# Patient Record
Sex: Female | Born: 1945 | Race: White | Hispanic: No | Smoking: Former smoker
Health system: Southern US, Community
[De-identification: ages and names within clinical notes are randomized; demographics above are authoritative.]

## PROBLEM LIST (undated history)

## (undated) DIAGNOSIS — E785 Hyperlipidemia, unspecified: Secondary | ICD-10-CM

## (undated) DIAGNOSIS — M129 Arthropathy, unspecified: Secondary | ICD-10-CM

## (undated) DIAGNOSIS — M1990 Unspecified osteoarthritis, unspecified site: Secondary | ICD-10-CM

## (undated) DIAGNOSIS — K219 Gastro-esophageal reflux disease without esophagitis: Secondary | ICD-10-CM

## (undated) DIAGNOSIS — N951 Menopausal and female climacteric states: Secondary | ICD-10-CM

## (undated) DIAGNOSIS — R55 Syncope and collapse: Secondary | ICD-10-CM

## (undated) DIAGNOSIS — M810 Age-related osteoporosis without current pathological fracture: Secondary | ICD-10-CM

## (undated) DIAGNOSIS — R7309 Other abnormal glucose: Secondary | ICD-10-CM

## (undated) HISTORY — DX: Arthropathy, unspecified: M12.9

## (undated) HISTORY — DX: Other abnormal glucose: R73.09

## (undated) HISTORY — DX: Age-related osteoporosis without current pathological fracture: M81.0

## (undated) HISTORY — PX: FINGER FRACTURE SURGERY: SHX638

## (undated) HISTORY — DX: Menopausal and female climacteric states: N95.1

## (undated) HISTORY — DX: Syncope and collapse: R55

## (undated) HISTORY — DX: Gastro-esophageal reflux disease without esophagitis: K21.9

## (undated) HISTORY — PX: BREAST BIOPSY: SHX20

## (undated) HISTORY — DX: Hyperlipidemia, unspecified: E78.5

## (undated) HISTORY — PX: ABDOMINAL HYSTERECTOMY: SHX81

## (undated) HISTORY — PX: COLONOSCOPY: SHX174

## (undated) HISTORY — PX: APPENDECTOMY: SHX54

---

## 2004-09-13 ENCOUNTER — Ambulatory Visit: Payer: Self-pay | Admitting: Internal Medicine

## 2005-11-16 ENCOUNTER — Emergency Department: Payer: Self-pay | Admitting: Emergency Medicine

## 2011-05-02 ENCOUNTER — Ambulatory Visit: Payer: Self-pay

## 2011-11-20 ENCOUNTER — Emergency Department: Payer: Self-pay | Admitting: Internal Medicine

## 2011-11-20 LAB — CK TOTAL AND CKMB (NOT AT ARMC)
CK, Total: 55 U/L (ref 21–215)
CK-MB: <0.5 ng/mL — ABNORMAL LOW (ref 0.5–3.6)

## 2011-11-20 LAB — CBC
HCT: 40.3 % (ref 35.0–47.0)
HGB: 13.6 g/dL (ref 12.0–16.0)
MCH: 30.3 pg (ref 26.0–34.0)
MCHC: 33.7 g/dL (ref 32.0–36.0)
MCV: 90 fL (ref 80–100)
Platelet: 191 10*3/uL (ref 150–440)
RBC: 4.49 10*6/uL (ref 3.80–5.20)
RDW: 14.4 % (ref 11.5–14.5)
WBC: 9.7 10*3/uL (ref 3.6–11.0)

## 2011-11-20 LAB — URINALYSIS, COMPLETE
Bilirubin,UR: NEGATIVE
Blood: NEGATIVE
Glucose,UR: NEGATIVE mg/dL (ref 0–75)
Hyaline Cast: 10
Ketone: NEGATIVE
Nitrite: NEGATIVE
Ph: 5 (ref 4.5–8.0)
Protein: NEGATIVE
RBC,UR: 1 /HPF (ref 0–5)
Specific Gravity: 1.012 (ref 1.003–1.030)
Squamous Epithelial: NONE SEEN
WBC UR: 3 /HPF (ref 0–5)

## 2011-11-20 LAB — COMPREHENSIVE METABOLIC PANEL
Albumin: 3.9 g/dL (ref 3.4–5.0)
Alkaline Phosphatase: 72 U/L (ref 50–136)
Anion Gap: 11 (ref 7–16)
BUN: 21 mg/dL — ABNORMAL HIGH (ref 7–18)
Bilirubin,Total: 0.7 mg/dL (ref 0.2–1.0)
Calcium, Total: 9.4 mg/dL (ref 8.5–10.1)
Chloride: 100 mmol/L (ref 98–107)
Co2: 30 mmol/L (ref 21–32)
Creatinine: 1 mg/dL (ref 0.60–1.30)
EGFR (African American): >60
EGFR (Non-African Amer.): 59 — ABNORMAL LOW
Glucose: 158 mg/dL — ABNORMAL HIGH (ref 65–99)
Osmolality: 288 (ref 275–301)
Potassium: 4.1 mmol/L (ref 3.5–5.1)
SGOT(AST): 54 U/L — ABNORMAL HIGH (ref 15–37)
SGPT (ALT): 62 U/L
Sodium: 141 mmol/L (ref 136–145)
Total Protein: 7.2 g/dL (ref 6.4–8.2)

## 2011-11-20 LAB — TROPONIN I: Troponin-I: <0.02 ng/mL

## 2011-11-20 LAB — HEMOGLOBIN A1C: Hemoglobin A1C: 6.5 % — ABNORMAL HIGH (ref 4.2–6.3)

## 2013-02-09 ENCOUNTER — Ambulatory Visit: Payer: Self-pay | Admitting: Family Medicine

## 2013-02-15 ENCOUNTER — Ambulatory Visit: Payer: Self-pay

## 2013-06-22 ENCOUNTER — Encounter: Payer: Self-pay | Admitting: *Deleted

## 2013-06-22 ENCOUNTER — Encounter: Payer: Self-pay | Admitting: Cardiovascular Disease

## 2013-06-22 ENCOUNTER — Ambulatory Visit (INDEPENDENT_AMBULATORY_CARE_PROVIDER_SITE_OTHER): Payer: Medicare Other | Admitting: Cardiovascular Disease

## 2013-06-22 VITALS — BP 128/83 | HR 87 | Ht 65.5 in | Wt 244.5 lb

## 2013-06-22 DIAGNOSIS — I10 Essential (primary) hypertension: Secondary | ICD-10-CM

## 2013-06-22 DIAGNOSIS — R002 Palpitations: Secondary | ICD-10-CM

## 2013-06-22 DIAGNOSIS — R0602 Shortness of breath: Secondary | ICD-10-CM

## 2013-06-22 DIAGNOSIS — I209 Angina pectoris, unspecified: Secondary | ICD-10-CM | POA: Insufficient documentation

## 2013-06-22 NOTE — Patient Instructions (Addendum)
Your physician has requested that you have en exercise stress myoview. For further information please visit [email or web address] Please follow instruction sheet, as given.  [facility] MYOVIEW  Your caregiver has ordered a Stress Test with nuclear imaging. The purpose of this test is to evaluate the blood supply to your heart muscle. This procedure is referred to as a "Non-Invasive Stress Test." This is because other than having an IV started in your vein, nothing is inserted or "invades" your body. Cardiac stress tests are done to find areas of poor blood flow to the heart by determining the extent of coronary artery disease (CAD). Some patients exercise on a treadmill, which naturally increases the blood flow to your heart, while others who are  unable to walk on a treadmill due to physical limitations have a pharmacologic/chemical stress agent called Lexiscan . This medicine will mimic walking on a treadmill by temporarily increasing your coronary blood flow.   Please note: these test may take anywhere between 2-4 hours to complete  PLEASE REPORT TO [facility]TR[facility]  THE VOLUNTEERS AT THE FIRST DESK WILL DIRECT YOU WHERE TO GO  Date of Procedure:__________THURSDAY, SEPT 18____________  Arrival Time for Procedure:_________7:30 am_____________________    PLEASE NOTIFY THE OFFICE AT LEAST 24 HOURS IN ADVANCE IF YOU ARE UNABLE TO KEEP YOUR APPOINTMENT.  [phone] AND  PLEASE NOTIFY NUCLEAR MEDICINE AT [facility] AT LEAST 24 HOURS IN ADVANCE IF YOU ARE UNABLE TO KEEP YOUR APPOINTMENT. [phone]  How to prepare for your Myoview test:  1. Do not eat or drink after midnight 2. No caffeine for 24 hours prior to test 3. No smoking 24 hours prior to test. 4. Your medication may be taken with water.  If your doctor stopped a medication because of this test, do not take that medication. 5. Ladies, please do not wear dresses.  Skirts or pants are appropriate. Please wear a short sleeve  shirt. 6. No perfume, cologne or lotion. 7. Wear comfortable walking shoes. No heels!

## 2013-06-22 NOTE — Assessment & Plan Note (Signed)
Blood pressure is well controlled on current medications. I will consider adding a beta blocker after the stress test.

## 2013-06-22 NOTE — Assessment & Plan Note (Signed)
Current symptoms are suggestive of class III angina. Symptoms include shortness of breath and neck pain without chest discomfort. Baseline ECG is  abnormal. I recommend urgent risk stratification with a treadmill nuclear stress test. Due to her abnormal baseline EKG, a treadmill stress test alone is not sufficient. Continue current medications. Further recommendations to follow after the stress test with possibility of proceeding with cardiac catheterization if the stress test is moderate or high risk.

## 2013-06-22 NOTE — Progress Notes (Signed)
HPI  This is a pleasant 67 year old female who was referred by Dr. Juanetta Gosling for evaluation of exertional dyspnea and neck pain. She has no previous cardiac history. She has known history of hypertension, hyperlipidemia, diet-controlled diabetes and GERD. Over the last 2 months, she started having significant exertional dyspnea with fatigue and left-sided neck discomfort with occasional radiation to the left on. This happens after walking about one block or going up one flight of stairs. She has not had any chest discomfort. She does feel palpitations as well. No orthopnea, PND or lower extremity edema. No previous similar symptoms. She had an EKG done yesterday which showed inferior Q waves mainly in lead 3 with inverted T waves in V1 and V2. No ST elevation or depression.    Allergies  Allergen Reactions  . Erythromycin      No current outpatient prescriptions on file prior to visit.   No current facility-administered medications on file prior to visit.     Past Medical History  Diagnosis Date  . Arthropathy, unspecified, site unspecified   . Esophageal reflux   . Unspecified essential hypertension   . Osteoporosis, unspecified   . Other and unspecified hyperlipidemia   . Symptomatic menopausal or female climacteric states   . Other abnormal glucose   . Syncope and collapse      Past Surgical History  Procedure Laterality Date  . Appendectomy    . Finger fracture surgery    . Colonoscopy    . Breast biopsy Left      Family History  Problem Relation Age of Onset  . Diabetes Mother   . Heart disease Mother   . Heart attack Mother      History   Social History  . Marital Status: Married    Spouse Name: N/A    Number of Children: N/A  . Years of Education: N/A   Occupational History  . Not on file.   Social History Main Topics  . Smoking status: Former Smoker -- 1.00 packs/day for 26 years  . Smokeless tobacco: Not on file  . Alcohol Use: Yes     Comment:  occassional  . Drug Use: No  . Sexual Activity: Not on file   Other Topics Concern  . Not on file   Social History Narrative  . No narrative on file     ROS A 10 POINT REVIEW OF SYSTEM WAS PERFORMED. IT'S NEGATIVE OTHER THAN WHAT'S MENTIONED IN HISTORY OF PRESENT ILLNESS.   PHYSICAL EXAM   BP 128/83  Pulse 87  Ht 5' 5.5" (1.664 m)  Wt 244 lb 8 oz (110.904 kg)  BMI 40.05 kg/m2 Constitutional: She is oriented to person, place, and time. She appears well-developed and well-nourished. No distress.  HENT: No nasal discharge.  Head: Normocephalic and atraumatic.  Eyes: Pupils are equal and round. Right eye exhibits no discharge. Left eye exhibits no discharge.  Neck: Normal range of motion. Neck supple. No JVD present. No thyromegaly present.  Cardiovascular: Normal rate, regular rhythm, normal heart sounds. Exam reveals no gallop and no friction rub. No murmur heard.  Pulmonary/Chest: Effort normal and breath sounds normal. No stridor. No respiratory distress. She has no wheezes. She has no rales. She exhibits no tenderness.  Abdominal: Soft. Bowel sounds are normal. She exhibits no distension. There is no tenderness. There is no rebound and no guarding.  Musculoskeletal: Normal range of motion. She exhibits no edema and no tenderness.  Neurological: She is alert and oriented to  person, place, and time. Coordination normal.  Skin: Skin is warm and dry. No rash noted. She is not diaphoretic. No erythema. No pallor.  Psychiatric: She has a normal mood and affect. Her behavior is normal. Judgment and thought content normal.     ZOX:WRUEAV sinus rhythm with Q wave in lead 3. Nonspecific anterior T wave changes.   ASSESSMENT AND PLAN

## 2013-06-24 ENCOUNTER — Ambulatory Visit: Payer: Self-pay | Admitting: Cardiovascular Disease

## 2013-06-24 ENCOUNTER — Encounter: Payer: Self-pay | Admitting: Cardiovascular Disease

## 2013-06-24 ENCOUNTER — Other Ambulatory Visit: Payer: Self-pay

## 2013-06-24 DIAGNOSIS — R0602 Shortness of breath: Secondary | ICD-10-CM

## 2013-06-25 ENCOUNTER — Telehealth: Payer: Self-pay

## 2013-06-25 LAB — HM HEPATITIS C SCREENING LAB: HM Hepatitis Screen: NEGATIVE

## 2013-06-25 NOTE — Telephone Encounter (Signed)
Pt would like stress test results.  

## 2013-06-28 ENCOUNTER — Telehealth: Payer: Self-pay

## 2013-06-28 ENCOUNTER — Ambulatory Visit: Payer: Self-pay | Admitting: Cardiovascular Disease

## 2013-06-28 DIAGNOSIS — R0600 Dyspnea, unspecified: Secondary | ICD-10-CM

## 2013-06-28 NOTE — Telephone Encounter (Signed)
Pt given myoview results and Dr Jari Sportsman recommendation to have echo and follow up appt with him after echo. Pt agreed with this plan.

## 2013-06-28 NOTE — Telephone Encounter (Signed)
Pt called wants results from stress test.

## 2013-06-29 NOTE — Telephone Encounter (Signed)
Second phone open for same thing. Per 9/22 note, the patient was made aware of her results and need for echo. Order placed by Katina Dung.

## 2013-06-30 ENCOUNTER — Encounter: Payer: Self-pay | Admitting: Cardiovascular Disease

## 2013-07-16 ENCOUNTER — Ambulatory Visit (INDEPENDENT_AMBULATORY_CARE_PROVIDER_SITE_OTHER): Payer: Medicare Other | Admitting: *Deleted

## 2013-07-16 DIAGNOSIS — R0602 Shortness of breath: Secondary | ICD-10-CM

## 2013-07-16 DIAGNOSIS — R0600 Dyspnea, unspecified: Secondary | ICD-10-CM

## 2013-07-16 DIAGNOSIS — R9431 Abnormal electrocardiogram [ECG] [EKG]: Secondary | ICD-10-CM

## 2013-07-16 DIAGNOSIS — I209 Angina pectoris, unspecified: Secondary | ICD-10-CM

## 2013-07-20 ENCOUNTER — Telehealth: Payer: Self-pay

## 2013-07-20 NOTE — Telephone Encounter (Signed)
Spoke w/ pt.  She is aware of results.  

## 2013-07-20 NOTE — Telephone Encounter (Signed)
Message copied by Marilynne Halsted on Tue Jul 20, 2013 10:44 AM ------      Message from: Lorine Bears A      Created: Tue Jul 20, 2013  7:58 AM       Inform patient that echo was normal.      CC: Dr. Juanetta Gosling ------

## 2013-07-23 ENCOUNTER — Ambulatory Visit (INDEPENDENT_AMBULATORY_CARE_PROVIDER_SITE_OTHER): Payer: Medicare Other | Admitting: Cardiovascular Disease

## 2013-07-23 ENCOUNTER — Encounter: Payer: Self-pay | Admitting: Cardiovascular Disease

## 2013-07-23 VITALS — BP 114/78 | HR 66 | Ht 65.5 in | Wt 237.2 lb

## 2013-07-23 DIAGNOSIS — R0600 Dyspnea, unspecified: Secondary | ICD-10-CM | POA: Insufficient documentation

## 2013-07-23 DIAGNOSIS — I10 Essential (primary) hypertension: Secondary | ICD-10-CM

## 2013-07-23 DIAGNOSIS — R0609 Other forms of dyspnea: Secondary | ICD-10-CM

## 2013-07-23 NOTE — Patient Instructions (Signed)
Your heart tests were normal.   Follow up as needed.  

## 2013-07-23 NOTE — Progress Notes (Signed)
HPI  This is a pleasant 67 year old female who is here for a follow up visit regarding exertional dyspnea and abnormal EKG.  She has known history of hypertension, hyperlipidemia, diet-controlled diabetes and GERD. Over the last few months, she started having significant exertional dyspnea with fatigue and left-sided neck discomfort with occasional radiation to the left arm. This happens after walking about one block or going up one flight of stairs. She has not had any chest discomfort. She does feel palpitations as well.  EKG showed inferior Q waves mainly in lead 3 with inverted T waves in V1 and V2. No ST elevation or depression.   She underwent a stress Myoview which showed no evidence of ischemia. Echo showed normal LVSF with no significant valvular abnormalities. She is actually feeling better with no recurrent symptoms.   Allergies  Allergen Reactions  . Erythromycin      Current Outpatient Prescriptions on File Prior to Visit  Medication Sig Dispense Refill  . amLODipine (NORVASC) 5 MG tablet Take 5 mg by mouth daily.       Marland Kitchen aspirin 81 MG tablet Take 81 mg by mouth daily.      . beta carotene w/minerals (OCUVITE) tablet Take 1 tablet by mouth daily.      . Calcium Carb-Cholecalciferol (CALTRATE 600+D) 600-800 MG-UNIT TABS Take 2 tablets by mouth daily.      . Cholecalciferol (VITAMIN D3) 50000 UNITS CAPS Take by mouth daily.      . Cyanocobalamin (VITAMIN B 12 PO) Take 500 mg by mouth daily.      Marland Kitchen EVISTA 60 MG tablet Take 60 mg by mouth daily.       Boris Lown Oil 300 MG CAPS Take by mouth daily.      Marland Kitchen lisinopril (PRINIVIL,ZESTRIL) 40 MG tablet Take 40 mg by mouth daily.      . Multiple Vitamin (MULTIVITAMIN) tablet Take 1 tablet by mouth daily.      Marland Kitchen NEXIUM 40 MG capsule Take 40 mg by mouth 2 (two) times daily.       . simvastatin (ZOCOR) 10 MG tablet Take 10 mg by mouth at bedtime.       . Misc Natural Products (OSTEO BI-FLEX JOINT SHIELD PO) Take by mouth as needed.         No current facility-administered medications on file prior to visit.     Past Medical History  Diagnosis Date  . Arthropathy, unspecified, site unspecified   . Esophageal reflux   . Osteoporosis, unspecified   . Other and unspecified hyperlipidemia   . Symptomatic menopausal or female climacteric states   . Other abnormal glucose   . Syncope and collapse   . Unspecified essential hypertension      Past Surgical History  Procedure Laterality Date  . Appendectomy    . Finger fracture surgery    . Colonoscopy    . Breast biopsy Left      Family History  Problem Relation Age of Onset  . Diabetes Mother   . Heart disease Mother   . Heart attack Mother      History   Social History  . Marital Status: Married    Spouse Name: N/A    Number of Children: N/A  . Years of Education: N/A   Occupational History  . Not on file.   Social History Main Topics  . Smoking status: Former Smoker -- 1.00 packs/day for 26 years  . Smokeless tobacco: Not on file  .  Alcohol Use: No     Comment: occassional  . Drug Use: No  . Sexual Activity: Not on file   Other Topics Concern  . Not on file   Social History Narrative  . No narrative on file      PHYSICAL EXAM   BP 114/78  Pulse 66  Ht 5' 5.5" (1.664 m)  Wt 237 lb 4 oz (107.616 kg)  BMI 38.87 kg/m2 Constitutional: She is oriented to person, place, and time. She appears well-developed and well-nourished. No distress.  HENT: No nasal discharge.  Head: Normocephalic and atraumatic.  Eyes: Pupils are equal and round. Right eye exhibits no discharge. Left eye exhibits no discharge.  Neck: Normal range of motion. Neck supple. No JVD present. No thyromegaly present.  Cardiovascular: Normal rate, regular rhythm, normal heart sounds. Exam reveals no gallop and no friction rub. No murmur heard.  Pulmonary/Chest: Effort normal and breath sounds normal. No stridor. No respiratory distress. She has no wheezes. She has no  rales. She exhibits no tenderness.  Abdominal: Soft. Bowel sounds are normal. She exhibits no distension. There is no tenderness. There is no rebound and no guarding.  Musculoskeletal: Normal range of motion. She exhibits no edema and no tenderness.  Neurological: She is alert and oriented to person, place, and time. Coordination normal.  Skin: Skin is warm and dry. No rash noted. She is not diaphoretic. No erythema. No pallor.  Psychiatric: She has a normal mood and affect. Her behavior is normal. Judgment and thought content normal.      ASSESSMENT AND PLAN

## 2013-07-23 NOTE — Assessment & Plan Note (Addendum)
Negative cardiac work up including stress test and echo.  Symptoms resolved without intervention.  Follow up as needed. Continue treatment of risk factors.  I discussed with the patient the importance of lifestyle changes in order to decrease the chance of future coronary artery disease and cardiovascular events. We discussed the importance of controlling risk factors, healthy diet as well as regular exercise. I also explained to him that a normal stress test does not rule out atherosclerosis.

## 2013-07-23 NOTE — Assessment & Plan Note (Signed)
BP is controlled 

## 2013-08-12 ENCOUNTER — Other Ambulatory Visit: Payer: Self-pay

## 2013-08-29 ENCOUNTER — Encounter: Payer: Self-pay | Admitting: Cardiovascular Disease

## 2013-09-01 ENCOUNTER — Ambulatory Visit: Payer: Self-pay | Admitting: Family Medicine

## 2013-12-08 ENCOUNTER — Ambulatory Visit: Payer: Self-pay | Admitting: Family Medicine

## 2013-12-16 ENCOUNTER — Ambulatory Visit: Payer: Self-pay | Admitting: Family Medicine

## 2014-06-14 LAB — HM DIABETES EYE EXAM

## 2015-06-05 ENCOUNTER — Encounter: Payer: Self-pay | Admitting: Family Medicine

## 2015-06-05 ENCOUNTER — Ambulatory Visit (INDEPENDENT_AMBULATORY_CARE_PROVIDER_SITE_OTHER): Payer: Medicare Other | Admitting: Family Medicine

## 2015-06-05 VITALS — BP 136/78 | HR 65 | Temp 97.9°F | Resp 16 | Ht 65.0 in | Wt 217.0 lb

## 2015-06-05 DIAGNOSIS — R1010 Upper abdominal pain, unspecified: Secondary | ICD-10-CM | POA: Diagnosis not present

## 2015-06-05 DIAGNOSIS — R0789 Other chest pain: Secondary | ICD-10-CM | POA: Diagnosis not present

## 2015-06-05 NOTE — Patient Instructions (Addendum)
Use Nexium 40 mg. Twice a day, every day.  Go to [facility] if sever persistent pain recurs.

## 2015-06-05 NOTE — Progress Notes (Signed)
Name: Yvette [name]   MRN: [id number]    DOB: 1946/06/22   Date:06/05/2015       Progress Note  Subjective  Chief Complaint  Chief Complaint  Patient presents with  . Gastrophageal Reflux    pt had pain last night under sternum. She took Tums and this last several hours.    HPI  C/o pain epigastrium last PM.  Resolved after taking some TUMS.  She almost called 911 because of the pain.  Then another episode early this AM, but it subsided.  She feels bloated in upper abdomen. Had BM after episode.  Felt very week.  Sweated.    Had card. W/u in 2014.  Cleared without evidence of sig. Heart disease.  Has extra stress at home with sick husband.  No problem-specific assessment & plan notes found for this encounter.   Past Medical History  Diagnosis Date  . Arthropathy, unspecified, site unspecified   . Esophageal reflux   . Osteoporosis, unspecified   . Other and unspecified hyperlipidemia   . Symptomatic menopausal or female climacteric states   . Other abnormal glucose   . Syncope and collapse   . Unspecified essential hypertension     Social History  Substance Use Topics  . Smoking status: Former Smoker -- 1.00 packs/day for 26 years    Types: Cigarettes    Quit date: 06/07/1992  . Smokeless tobacco: Never Used  . Alcohol Use: No     Comment: occassional     Current outpatient prescriptions:  .  amLODipine (NORVASC) 5 MG tablet, Take 5 mg by mouth daily. , Disp: , Rfl:  .  aspirin 81 MG tablet, Take 81 mg by mouth daily., Disp: , Rfl:  .  beta carotene w/minerals (OCUVITE) tablet, Take 1 tablet by mouth daily., Disp: , Rfl:  .  Calcium Carb-Cholecalciferol (CALTRATE 600+D) 600-800 MG-UNIT TABS, Take 2 tablets by mouth daily., Disp: , Rfl:  .  Cholecalciferol (VITAMIN D3) 50000 UNITS CAPS, Take by mouth daily., Disp: , Rfl:  .  Cyanocobalamin (VITAMIN B 12 PO), Take 500 mg by mouth daily., Disp: , Rfl:  .  EVISTA 60 MG tablet, Take 60 mg by mouth daily. , Disp: , Rfl:   .  hydrochlorothiazide (HYDRODIURIL) 25 MG tablet, Take 25 mg by mouth daily., Disp: , Rfl:  .  Krill Oil 300 MG CAPS, Take by mouth daily., Disp: , Rfl:  .  lisinopril (PRINIVIL,ZESTRIL) 40 MG tablet, Take 40 mg by mouth daily., Disp: , Rfl:  .  Misc Natural Products (OSTEO BI-FLEX JOINT SHIELD PO), Take by mouth as needed. , Disp: , Rfl:  .  Multiple Vitamin (MULTIVITAMIN) tablet, Take 1 tablet by mouth daily., Disp: , Rfl:  .  naproxen (NAPROSYN) 250 MG tablet, Take 250 mg by mouth as needed., Disp: , Rfl:  .  NEXIUM 40 MG capsule, Take 40 mg by mouth 2 (two) times daily. , Disp: , Rfl:  .  simvastatin (ZOCOR) 10 MG tablet, Take 10 mg by mouth at bedtime. , Disp: , Rfl:   Allergies  Allergen Reactions  . Erythromycin     Review of Systems  Constitutional: Positive for malaise/fatigue. Negative for fever, chills and weight loss.  HENT: Negative for hearing loss.   Eyes: Negative for blurred vision and double vision.  Respiratory: Negative for cough, sputum production, shortness of breath and wheezing.   Cardiovascular: Positive for chest pain. Negative for palpitations, orthopnea and leg swelling.  Gastrointestinal: Positive for heartburn, nausea  and abdominal pain. Negative for vomiting, diarrhea and blood in stool.  Genitourinary: Negative for dysuria, urgency and frequency.  Musculoskeletal: Negative for myalgias and joint pain.  Skin: Negative for rash.  Neurological: Negative for dizziness, sensory change, focal weakness, weakness and headaches.  Psychiatric/Behavioral: Negative for depression. The patient is not nervous/anxious.       Objective  Filed Vitals:   06/05/15 1100  BP: 136/78  Pulse: 65  Temp: 97.9 F (36.6 C)  TempSrc: Oral  Resp: 16  Height: 5\' 5"  (1.651 m)  Weight: 217 lb (98.431 kg)     Physical Exam  Constitutional: She is oriented to person, place, and time and well-developed, well-nourished, and in no distress. No distress.  HENT:  Head:  Normocephalic and atraumatic.  Eyes: Conjunctivae and EOM are normal. Pupils are equal, round, and reactive to light. No scleral icterus.  Neck: Neck supple. Carotid bruit is not present. No thyromegaly present.  Cardiovascular: Normal rate, regular rhythm, normal heart sounds and intact distal pulses.  Exam reveals no gallop and no friction rub.   No murmur heard. EKG wnl.  Pulmonary/Chest: Effort normal and breath sounds normal. No respiratory distress. She has no wheezes. She has no rales. She exhibits no tenderness.  Abdominal: Soft. Bowel sounds are normal. She exhibits no mass. Distention: mild vague tenderness in epigastrium and RUQ.   There is tenderness. There is no rebound and no guarding.  Musculoskeletal: She exhibits no edema.  Lymphadenopathy:    She has no cervical adenopathy.  Neurological: She is alert and oriented to person, place, and time.  Vitals reviewed.     No results found for this or any previous visit (from the past 2160 hour(s)).   Assessment & Plan  1. Pain of upper abdomen  - EKG 12-Lead-wnl - US Abdomen Limited; Future - Ambulatory referral to Gastroenterology  2. Chest discomfort  - EKG 12-Lead-wnl

## 2015-06-06 ENCOUNTER — Emergency Department: Payer: Medicare Other

## 2015-06-06 ENCOUNTER — Encounter: Payer: Self-pay | Admitting: Emergency Medicine

## 2015-06-06 ENCOUNTER — Inpatient Hospital Stay
Admission: EM | Admit: 2015-06-06 | Discharge: 2015-06-08 | DRG: 444 | Disposition: A | Discharge status: Home / Self Care | Payer: Medicare Other | Attending: Internal Medicine | Admitting: Internal Medicine

## 2015-06-06 ENCOUNTER — Inpatient Hospital Stay: Payer: Medicare Other

## 2015-06-06 DIAGNOSIS — K8309 Other cholangitis: Secondary | ICD-10-CM

## 2015-06-06 DIAGNOSIS — D638 Anemia in other chronic diseases classified elsewhere: Secondary | ICD-10-CM | POA: Diagnosis present

## 2015-06-06 DIAGNOSIS — R112 Nausea with vomiting, unspecified: Secondary | ICD-10-CM | POA: Diagnosis not present

## 2015-06-06 DIAGNOSIS — E872 Acidosis: Secondary | ICD-10-CM | POA: Diagnosis not present

## 2015-06-06 DIAGNOSIS — R748 Abnormal levels of other serum enzymes: Secondary | ICD-10-CM | POA: Diagnosis not present

## 2015-06-06 DIAGNOSIS — I10 Essential (primary) hypertension: Secondary | ICD-10-CM | POA: Diagnosis not present

## 2015-06-06 DIAGNOSIS — Z8249 Family history of ischemic heart disease and other diseases of the circulatory system: Secondary | ICD-10-CM

## 2015-06-06 DIAGNOSIS — Z7982 Long term (current) use of aspirin: Secondary | ICD-10-CM | POA: Diagnosis not present

## 2015-06-06 DIAGNOSIS — B9620 Unspecified Escherichia coli [E. coli] as the cause of diseases classified elsewhere: Secondary | ICD-10-CM | POA: Diagnosis not present

## 2015-06-06 DIAGNOSIS — Z79899 Other long term (current) drug therapy: Secondary | ICD-10-CM

## 2015-06-06 DIAGNOSIS — Z833 Family history of diabetes mellitus: Secondary | ICD-10-CM

## 2015-06-06 DIAGNOSIS — K859 Acute pancreatitis without necrosis or infection, unspecified: Secondary | ICD-10-CM | POA: Diagnosis present

## 2015-06-06 DIAGNOSIS — K5730 Diverticulosis of large intestine without perforation or abscess without bleeding: Secondary | ICD-10-CM | POA: Diagnosis not present

## 2015-06-06 DIAGNOSIS — D6959 Other secondary thrombocytopenia: Secondary | ICD-10-CM | POA: Diagnosis not present

## 2015-06-06 DIAGNOSIS — M129 Arthropathy, unspecified: Secondary | ICD-10-CM | POA: Diagnosis present

## 2015-06-06 DIAGNOSIS — N281 Cyst of kidney, acquired: Secondary | ICD-10-CM | POA: Diagnosis not present

## 2015-06-06 DIAGNOSIS — R109 Unspecified abdominal pain: Secondary | ICD-10-CM | POA: Diagnosis not present

## 2015-06-06 DIAGNOSIS — K219 Gastro-esophageal reflux disease without esophagitis: Secondary | ICD-10-CM | POA: Diagnosis present

## 2015-06-06 DIAGNOSIS — R740 Nonspecific elevation of levels of transaminase and lactic acid dehydrogenase [LDH]: Secondary | ICD-10-CM | POA: Diagnosis present

## 2015-06-06 DIAGNOSIS — E785 Hyperlipidemia, unspecified: Secondary | ICD-10-CM | POA: Diagnosis not present

## 2015-06-06 DIAGNOSIS — M810 Age-related osteoporosis without current pathological fracture: Secondary | ICD-10-CM | POA: Diagnosis present

## 2015-06-06 DIAGNOSIS — Z87891 Personal history of nicotine dependence: Secondary | ICD-10-CM | POA: Diagnosis not present

## 2015-06-06 DIAGNOSIS — R1010 Upper abdominal pain, unspecified: Secondary | ICD-10-CM

## 2015-06-06 DIAGNOSIS — M1990 Unspecified osteoarthritis, unspecified site: Secondary | ICD-10-CM | POA: Diagnosis not present

## 2015-06-06 DIAGNOSIS — R110 Nausea: Secondary | ICD-10-CM | POA: Diagnosis not present

## 2015-06-06 DIAGNOSIS — A419 Sepsis, unspecified organism: Secondary | ICD-10-CM | POA: Diagnosis present

## 2015-06-06 DIAGNOSIS — K8061 Calculus of gallbladder and bile duct with cholecystitis, unspecified, with obstruction: Secondary | ICD-10-CM | POA: Diagnosis not present

## 2015-06-06 DIAGNOSIS — R509 Fever, unspecified: Secondary | ICD-10-CM | POA: Diagnosis not present

## 2015-06-06 DIAGNOSIS — R1011 Right upper quadrant pain: Secondary | ICD-10-CM | POA: Diagnosis not present

## 2015-06-06 DIAGNOSIS — E876 Hypokalemia: Secondary | ICD-10-CM | POA: Diagnosis not present

## 2015-06-06 DIAGNOSIS — R7881 Bacteremia: Secondary | ICD-10-CM | POA: Diagnosis not present

## 2015-06-06 DIAGNOSIS — K830 Cholangitis: Secondary | ICD-10-CM | POA: Diagnosis not present

## 2015-06-06 DIAGNOSIS — R1084 Generalized abdominal pain: Secondary | ICD-10-CM | POA: Diagnosis not present

## 2015-06-06 LAB — LIPID PANEL
Cholesterol: 192 mg/dL (ref 0–200)
HDL: 56 mg/dL (ref >40)
LDL Cholesterol: 105 mg/dL — ABNORMAL HIGH (ref 0–99)
Total CHOL/HDL Ratio: 3.4 RATIO
Triglycerides: 153 mg/dL — ABNORMAL HIGH (ref <150)
VLDL: 31 mg/dL (ref 0–40)

## 2015-06-06 LAB — CBC WITH DIFFERENTIAL/PLATELET
Basophils Absolute: 0 10*3/uL (ref 0–0.1)
Basophils Relative: 0 %
Eosinophils Absolute: 0 10*3/uL (ref 0–0.7)
Eosinophils Relative: 1 %
HCT: 39.4 % (ref 35.0–47.0)
Hemoglobin: 13.1 g/dL (ref 12.0–16.0)
Lymphocytes Relative: 4 %
Lymphs Abs: 0.2 10*3/uL — ABNORMAL LOW (ref 1.0–3.6)
MCH: 29.8 pg (ref 26.0–34.0)
MCHC: 33.3 g/dL (ref 32.0–36.0)
MCV: 89.7 fL (ref 80.0–100.0)
Monocytes Absolute: 0.1 10*3/uL — ABNORMAL LOW (ref 0.2–0.9)
Monocytes Relative: 2 %
Neutro Abs: 4.2 10*3/uL (ref 1.4–6.5)
Neutrophils Relative %: 93 %
Platelets: 110 10*3/uL — ABNORMAL LOW (ref 150–440)
RBC: 4.39 MIL/uL (ref 3.80–5.20)
RDW: 14.2 % (ref 11.5–14.5)
WBC: 4.5 10*3/uL (ref 3.6–11.0)

## 2015-06-06 LAB — LIPASE, BLOOD: Lipase: 140 U/L — ABNORMAL HIGH (ref 22–51)

## 2015-06-06 LAB — URINALYSIS COMPLETE WITH MICROSCOPIC (ARMC ONLY)
Bacteria, UA: NONE SEEN
Bilirubin Urine: NEGATIVE
Glucose, UA: NEGATIVE mg/dL
Hgb urine dipstick: NEGATIVE
Ketones, ur: NEGATIVE mg/dL
Leukocytes, UA: NEGATIVE
Nitrite: NEGATIVE
Protein, ur: NEGATIVE mg/dL
Specific Gravity, Urine: 1.017 (ref 1.005–1.030)
pH: 7 (ref 5.0–8.0)

## 2015-06-06 LAB — COMPREHENSIVE METABOLIC PANEL
ALT: 116 U/L — ABNORMAL HIGH (ref 14–54)
AST: 210 U/L — ABNORMAL HIGH (ref 15–41)
Albumin: 3.9 g/dL (ref 3.5–5.0)
Alkaline Phosphatase: 90 U/L (ref 38–126)
Anion gap: 9 (ref 5–15)
BUN: 16 mg/dL (ref 6–20)
CO2: 25 mmol/L (ref 22–32)
Calcium: 8.8 mg/dL — ABNORMAL LOW (ref 8.9–10.3)
Chloride: 106 mmol/L (ref 101–111)
Creatinine, Ser: 0.61 mg/dL (ref 0.44–1.00)
GFR calc Af Amer: >60 mL/min (ref >60)
GFR calc non Af Amer: >60 mL/min (ref >60)
Glucose, Bld: 195 mg/dL — ABNORMAL HIGH (ref 65–99)
Potassium: 3.9 mmol/L (ref 3.5–5.1)
Sodium: 140 mmol/L (ref 135–145)
Total Bilirubin: 2.7 mg/dL — ABNORMAL HIGH (ref 0.3–1.2)
Total Protein: 6.4 g/dL — ABNORMAL LOW (ref 6.5–8.1)

## 2015-06-06 LAB — LACTIC ACID, PLASMA
Lactic Acid, Venous: 3.3 mmol/L (ref 0.5–2.0)
Lactic Acid, Venous: 2.6 mmol/L (ref 0.5–2.0)

## 2015-06-06 MED ORDER — ACETAMINOPHEN 325 MG PO TABS
650.0000 mg | ORAL_TABLET | Freq: Three times a day (TID) | ORAL | Status: DC | PRN
  Administered 2015-06-06 – 2015-06-07 (×3): 650 mg via ORAL
  Filled 2015-06-06 (×3): qty 2

## 2015-06-06 MED ORDER — INFLUENZA VAC SPLIT QUAD 0.5 ML IM SUSY
0.5000 mL | PREFILLED_SYRINGE | INTRAMUSCULAR | Status: AC
Start: 2015-06-07 — End: 2015-06-07
  Administered 2015-06-07: 0.5 mL via INTRAMUSCULAR
  Filled 2015-06-06: qty 0.5

## 2015-06-06 MED ORDER — ONDANSETRON HCL 4 MG/2ML IJ SOLN: 4.0000 mg | Freq: Four times a day (QID) | INTRAMUSCULAR | Status: DC | PRN

## 2015-06-06 MED ORDER — PIPERACILLIN-TAZOBACTAM 3.375 G IVPB
3.3750 g | Freq: Once | INTRAVENOUS | Status: AC
  Administered 2015-06-06: 3.375 g via INTRAVENOUS
  Filled 2015-06-06: qty 50

## 2015-06-06 MED ORDER — SIMVASTATIN 10 MG PO TABS
10.0000 mg | ORAL_TABLET | Freq: Every day | ORAL | Status: DC
  Administered 2015-06-06 – 2015-06-07 (×2): 10 mg via ORAL
  Filled 2015-06-06 (×2): qty 1

## 2015-06-06 MED ORDER — IOHEXOL 350 MG/ML SOLN
100.0000 mL | Freq: Once | INTRAVENOUS | Status: AC | PRN
  Administered 2015-06-06: 100 mL via INTRAVENOUS

## 2015-06-06 MED ORDER — SODIUM CHLORIDE 0.9 % IV BOLUS (SEPSIS)
1000.0000 mL | Freq: Once | INTRAVENOUS | Status: AC
  Administered 2015-06-06: 1000 mL via INTRAVENOUS

## 2015-06-06 MED ORDER — AMLODIPINE BESYLATE 5 MG PO TABS: 5.0000 mg | ORAL_TABLET | Freq: Every day | ORAL | Status: DC

## 2015-06-06 MED ORDER — MORPHINE SULFATE (PF) 2 MG/ML IV SOLN: 2.0000 mg | INTRAVENOUS | Status: DC | PRN

## 2015-06-06 MED ORDER — SODIUM CHLORIDE 0.9 % IV BOLUS (SEPSIS)
1000.0000 mL | Freq: Once | INTRAVENOUS | Status: AC
  Administered 2015-06-06: 1000 mL via INTRAVENOUS
  Filled 2015-06-06: qty 1000

## 2015-06-06 MED ORDER — ASPIRIN 81 MG PO CHEW
81.0000 mg | CHEWABLE_TABLET | Freq: Every day | ORAL | Status: DC
  Administered 2015-06-06 – 2015-06-07 (×2): 81 mg via ORAL
  Filled 2015-06-06 (×2): qty 1

## 2015-06-06 MED ORDER — ONDANSETRON HCL 4 MG/2ML IJ SOLN
4.0000 mg | Freq: Once | INTRAMUSCULAR | Status: AC
  Administered 2015-06-06: 4 mg via INTRAVENOUS
  Filled 2015-06-06: qty 2

## 2015-06-06 MED ORDER — IBUPROFEN 600 MG PO TABS
600.0000 mg | ORAL_TABLET | Freq: Once | ORAL | Status: AC
  Administered 2015-06-06: 600 mg via ORAL
  Filled 2015-06-06: qty 1

## 2015-06-06 MED ORDER — KETOROLAC TROMETHAMINE 30 MG/ML IJ SOLN
30.0000 mg | Freq: Once | INTRAMUSCULAR | Status: AC
  Administered 2015-06-06: 30 mg via INTRAVENOUS
  Filled 2015-06-06: qty 1

## 2015-06-06 MED ORDER — SODIUM CHLORIDE 0.9 % IV SOLN
INTRAVENOUS | Status: DC
  Administered 2015-06-06 – 2015-06-07 (×2): via INTRAVENOUS

## 2015-06-06 MED ORDER — CIPROFLOXACIN IN D5W 400 MG/200ML IV SOLN
400.0000 mg | Freq: Two times a day (BID) | INTRAVENOUS | Status: DC
  Administered 2015-06-06 – 2015-06-07 (×3): 400 mg via INTRAVENOUS
  Filled 2015-06-06 (×6): qty 200

## 2015-06-06 MED ORDER — METRONIDAZOLE IN NACL 5-0.79 MG/ML-% IV SOLN
500.0000 mg | Freq: Three times a day (TID) | INTRAVENOUS | Status: DC
  Administered 2015-06-06 – 2015-06-07 (×2): 500 mg via INTRAVENOUS
  Filled 2015-06-06 (×5): qty 100

## 2015-06-06 MED ORDER — IOHEXOL 240 MG/ML SOLN
25.0000 mL | Freq: Once | INTRAMUSCULAR | Status: DC | PRN
  Administered 2015-06-06: 25 mL via ORAL

## 2015-06-06 MED ORDER — LISINOPRIL 20 MG PO TABS: 40.0000 mg | ORAL_TABLET | Freq: Every day | ORAL | Status: DC

## 2015-06-06 MED ORDER — HYDROCHLOROTHIAZIDE 25 MG PO TABS: 25.0000 mg | ORAL_TABLET | Freq: Every day | ORAL | Status: DC

## 2015-06-06 MED ORDER — IOHEXOL 240 MG/ML SOLN: 25.0000 mL | Freq: Once | INTRAMUSCULAR | Status: DC | PRN

## 2015-06-06 MED ORDER — ASPIRIN 81 MG PO TABS: 81.0000 mg | ORAL_TABLET | Freq: Every day | ORAL | Status: DC

## 2015-06-06 MED ORDER — HEPARIN SODIUM (PORCINE) 5000 UNIT/ML IJ SOLN
5000.0000 [IU] | Freq: Three times a day (TID) | INTRAMUSCULAR | Status: DC
  Administered 2015-06-06 – 2015-06-07 (×3): 5000 [IU] via SUBCUTANEOUS
  Filled 2015-06-06 (×3): qty 1

## 2015-06-06 NOTE — Progress Notes (Signed)
Notified Dr. Lavetta Nielsen of blood cx results: gram neg rods in aenerobic and aerobic. Pt not on any abx currently. MD to place order for abx.

## 2015-06-06 NOTE — H&P (Signed)
[facility] NAME: Yvette [name]    MR#:  [id number]  DATE OF BIRTH:  04/13/1946  DATE OF ADMISSION:  06/06/2015  PRIMARY CARE PHYSICIAN: Dicky Doe, MD   REQUESTING/REFERRING PHYSICIAN: Carrie Mew  CHIEF COMPLAINT:   Chief Complaint  Patient presents with  . Weakness  . Emesis  . Abdominal Pain  . Nausea    HISTORY OF PRESENT ILLNESS: Yvette [name]  is a 69 y.o. female with a known history of arthritis, gastric reflux, hyperlipidemia, hypertension- had severe retrosternal chest pain on Sunday night which was day before yesterday, was sharp 7-8 out of 10, nonradiating- she was almost about to call EMS for that but then after a few minutes the pain subsided and A morning which was yesterday she went to [place] office for that. PMD did EKG to rule out any cardiac event which was negative and advised her to follow with GI as outpatient and ordered an ultrasound of abdomen for her.   Today on the morning she woke up with excessive chills, feeling generalized weakness, extreme nausea, but only slight discomfort in her epigastric region. She denies any diarrhea, or vomiting. In ER right upper quadrant sonogram was negative for gallstones or changes of cholecystitis, x-ray chest is also negative for any acute findings. So she is given for admission to hospitalist team after finding of elevated lipase and bilirubin level. She was also noted to have low-grade fever in emergency room.  PAST MEDICAL HISTORY:   Past Medical History  Diagnosis Date  . Arthropathy, unspecified, site unspecified   . Esophageal reflux   . Osteoporosis, unspecified   . Other and unspecified hyperlipidemia   . Symptomatic menopausal or female climacteric states   . Other abnormal glucose   . Syncope and collapse   . Unspecified essential hypertension     PAST SURGICAL HISTORY:  Past Surgical History  Procedure Laterality Date  . Appendectomy     . Finger fracture surgery    . Colonoscopy    . Breast biopsy Left     SOCIAL HISTORY:  Social History  Substance Use Topics  . Smoking status: Former Smoker -- 1.00 packs/day for 26 years    Types: Cigarettes    Quit date: 06/07/1992  . Smokeless tobacco: Never Used  . Alcohol Use: No     Comment: occassional    FAMILY HISTORY:  Family History  Problem Relation Age of Onset  . Diabetes Mother   . Heart disease Mother   . Heart attack Mother   . Ulcers Father   . Heart attack Brother     DRUG ALLERGIES:  Allergies  Allergen Reactions  . Erythromycin Nausea And Vomiting    REVIEW OF SYSTEMS:   CONSTITUTIONAL: positive for fever, fatigue or weakness. Positive for chills. EYES: No blurred or double vision.  EARS, NOSE, AND THROAT: No tinnitus or ear pain.  RESPIRATORY: No cough, shortness of breath, wheezing or hemoptysis.  CARDIOVASCULAR: No chest pain, orthopnea, edema.  GASTROINTESTINAL:positive for nausea, no vomiting, diarrhea. She had some abdominal pain.  GENITOURINARY: No dysuria, hematuria.  ENDOCRINE: No polyuria, nocturia,  HEMATOLOGY: No anemia, easy bruising or bleeding SKIN: No rash or lesion. MUSCULOSKELETAL: No joint pain or arthritis.   NEUROLOGIC: No tingling, numbness, weakness.  PSYCHIATRY: No anxiety or depression.   MEDICATIONS AT HOME:  Prior to Admission medications   Medication Sig Start Date End Date Taking? Authorizing Provider  amLODipine (NORVASC) 5 MG  tablet Take 5 mg by mouth daily.  06/16/13  Yes Historical Provider, MD  aspirin 81 MG tablet Take 81 mg by mouth daily.   Yes Historical Provider, MD  beta carotene w/minerals (OCUVITE) tablet Take 1 tablet by mouth daily.   Yes Historical Provider, MD  Calcium Carb-Cholecalciferol (CALTRATE 600+D) 600-800 MG-UNIT TABS Take 2 tablets by mouth daily.   Yes Historical Provider, MD  Cholecalciferol (VITAMIN D3) 50000 UNITS CAPS Take by mouth daily.   Yes Historical Provider, MD   Cyanocobalamin (VITAMIN B 12 PO) Take 500 mg by mouth daily.   Yes Historical Provider, MD  EVISTA 60 MG tablet Take 60 mg by mouth daily.  06/16/13  Yes Historical Provider, MD  hydrochlorothiazide (HYDRODIURIL) 25 MG tablet Take 25 mg by mouth daily.   Yes Historical Provider, MD  Javier Docker Oil 300 MG CAPS Take by mouth daily.   Yes Historical Provider, MD  lisinopril (PRINIVIL,ZESTRIL) 40 MG tablet Take 40 mg by mouth daily.   Yes Historical Provider, MD  Misc Natural Products (OSTEO BI-FLEX JOINT SHIELD PO) Take by mouth as needed.    Yes Historical Provider, MD  Multiple Vitamin (MULTIVITAMIN) tablet Take 1 tablet by mouth daily.   Yes Historical Provider, MD  naproxen (NAPROSYN) 250 MG tablet Take 250 mg by mouth as needed.   Yes Historical Provider, MD  pantoprazole (PROTONIX) 40 MG tablet Take 40 mg by mouth 2 (two) times daily.   Yes Historical Provider, MD  simvastatin (ZOCOR) 10 MG tablet Take 10 mg by mouth at bedtime.    Yes Arlis Porta., MD      PHYSICAL EXAMINATION:   VITAL SIGNS: Blood pressure 141/70, pulse 91, temperature 101.7 F (38.7 C), temperature source Oral, resp. rate 13, height 5\' 5"  (1.651 m), weight 98.431 kg (217 lb), SpO2 96 %.  GENERAL:  69 y.o.-year-old obase patient lying in the bed with no acute distress.  EYES: Pupils equal, round, reactive to light and accommodation. No scleral icterus. Extraocular muscles intact.  HEENT: Head atraumatic, normocephalic. Oropharynx and nasopharynx clear.  NECK:  Supple, no jugular venous distention. No thyroid enlargement, no tenderness.  LUNGS: Normal breath sounds bilaterally, no wheezing, rales,rhonchi or crepitation. No use of accessory muscles of respiration.  CARDIOVASCULAR: S1, S2 normal. No murmurs, rubs, or gallops.  ABDOMEN: Soft, mild tender in Right upper quadrent on pressure, nondistended. Bowel sounds present. No organomegaly or mass.  EXTREMITIES: No pedal edema, cyanosis, or clubbing.  NEUROLOGIC:  Cranial nerves II through XII are intact. Muscle strength 5/5 in all extremities. Sensation intact. Gait not checked.  PSYCHIATRIC: The patient is alert and oriented x 3.  SKIN: No obvious rash, lesion, or ulcer.   LABORATORY PANEL:   CBC  Recent Labs Lab 06/06/15 0825  WBC 4.5  HGB 13.1  HCT 39.4  PLT 110*  MCV 89.7  MCH 29.8  MCHC 33.3  RDW 14.2  LYMPHSABS 0.2*  MONOABS 0.1*  EOSABS 0.0  BASOSABS 0.0   ------------------------------------------------------------------------------------------------------------------  Chemistries   Recent Labs Lab 06/06/15 0825  NA 140  K 3.9  CL 106  CO2 25  GLUCOSE 195*  BUN 16  CREATININE 0.61  CALCIUM 8.8*  AST 210*  ALT 116*  ALKPHOS 90  BILITOT 2.7*   ------------------------------------------------------------------------------------------------------------------ estimated creatinine clearance is 74.9 mL/min (by C-G formula based on Cr of 0.61). ------------------------------------------------------------------------------------------------------------------ No results for input(s): TSH, T4TOTAL, T3FREE, THYROIDAB in the last 72 hours.  Invalid input(s): FREET3   Coagulation profile No results  for input(s): INR, PROTIME in the last 168 hours. ------------------------------------------------------------------------------------------------------------------- No results for input(s): DDIMER in the last 72 hours. -------------------------------------------------------------------------------------------------------------------  Cardiac Enzymes No results for input(s): CKMB, TROPONINI, MYOGLOBIN in the last 168 hours.  Invalid input(s): CK ------------------------------------------------------------------------------------------------------------------ Invalid input(s): POCBNP  ---------------------------------------------------------------------------------------------------------------  Urinalysis    Component  Value Date/Time   COLORURINE Yellow 11/20/2011 1950   APPEARANCEUR Hazy 11/20/2011 1950   LABSPEC 1.012 11/20/2011 1950   PHURINE 5.0 11/20/2011 1950   GLUCOSEU Negative 11/20/2011 1950   HGBUR Negative 11/20/2011 1950   BILIRUBINUR Negative 11/20/2011 1950   KETONESUR Negative 11/20/2011 1950   PROTEINUR Negative 11/20/2011 1950   NITRITE Negative 11/20/2011 1950   LEUKOCYTESUR Trace 11/20/2011 1950     RADIOLOGY: Dg Chest Port 1 View  06/06/2015   CLINICAL DATA:  69 year old with weakness and abdominal pain with nausea and vomiting for the past 2 days. Initial encounter.  EXAM: PORTABLE CHEST - 1 VIEW  COMPARISON:  None.  FINDINGS: Limited portable exam.  No infiltrate, congestive heart failure or pneumothorax.  Heart size top-normal.  Calcified mildly tortuous aorta.  The patient would eventually benefit from two view chest.  Mild acromioclavicular joint degenerative changes.  IMPRESSION: No infiltrate, congestive heart failure or pneumothorax.  Heart size top-normal.  Calcified mildly tortuous aorta.  The patient would eventually benefit from two view chest.   Electronically Signed   By: Genia Del M.D.   On: 06/06/2015 09:25   US Abdomen Limited Ruq  06/06/2015   CLINICAL DATA:  Right upper quadrant pain.  Fever.  EXAM: US ABDOMEN LIMITED - RIGHT UPPER QUADRANT  COMPARISON:  None.  FINDINGS: Gallbladder:  No gallstones or wall thickening visualized. No sonographic Murphy sign noted.  Common bile duct:  Diameter: 4.8 mm  Liver:  No focal lesion identified. Within normal limits in parenchymal echogenicity.  Parapelvic cyst versus hydronephrosis right kidney.  IMPRESSION: Negative for gallstones.  No focal liver lesion.  Right renal parapelvic cyst versus mild hydronephrosis.   Electronically Signed   By: Franchot Gallo M.D.   On: 06/06/2015 09:19    EKG:  NSR  IMPRESSION AND PLAN:  * Acute pancreatitis  Keep on clear liquid diet, IV fluids, and symptomatic management of her nausea  and pain.  Check lipid panel, right upper quadrant sonogram negative for stone, we will get CT scan abdomen for further details.   * Elevated lactic acid and fever  Most likely due to pancreatitis, continue monitoring.  * Elevated liver function test  Right upper quadrant sonogram is negative, I will get CT scan abdomen, and follow tomorrow.  * Hypertension  Continue home medications, monitor.  * Hyperlipidemia  Continue statin, check lipid panel.  All the records are reviewed and case discussed with ED provider. Management plans discussed with the patient, family and they are in agreement.  CODE STATUS: FUll.  Advance Directive Documentation        Most Recent Value   Type of Advance Directive  Living will   Pre-existing out of facility DNR order (yellow form or pink MOST form)     "MOST" Form in Place?        TOTAL TIME TAKING CARE OF THIS PATIENT: 50 minutes.    Vaughan Basta M.D on 06/06/2015   Between 7am to 6pm - Pager - [phone]  After 6pm go to [email or web address] - password [email or web address] [facility]  Office  [phone]  CC: Primary care physician; Dicky Doe, MD

## 2015-06-06 NOTE — Consult Note (Signed)
GI Inpatient Consult Note  Reason for Consult: Pancreatitis   Attending Requesting Consult: Anselm Jungling  History of Present Illness: Yvette [name] is a 69 y.o. female with a h/o GERD, OA, hyperlipidemia, and HTN admitted for acute pancreatitis.  Patient began having retrosternal chest pain on Sunday night, evaluated by PCP the next morning to r/o cardiac events.  EKG was normal so she was referred for an abd [place] and GI consult for further evaluation.  This morning she began having chills, malaise, nausea, and epigastric pain.  She presented to the [facility] ED, [place] and CXR negative.  Labs revealed lipase 140, lactic acid 3.3, AST 210, ALT 116, total bilirubin 2.7, total protein 6.4.  She was admitted for acute pancreatitis and made NPO.  Lipid panel revealed triglycerides 153, CT abd/pelvis did not show evidence of acute pancreatitis or other etiology of symptoms.  Ms. Kandler reports much symptomatic improvement since this morning. Nausea has subsided with Zofran and she denies any further epigastric pain since Sunday.  Patient states she has been experiencing nausea for a few weeks, but related it to increased stress d/t her husband being treated for cancer.  She denies vomiting, appetite changes, weight changes, hematochezia, and melena.  She also states she only had epigastric pain Sunday night after eating some oriental food for dinner. The pain was relieved with Nexium and Tums, which she has taken intermittently for GERD.  Aside from chills and malaise this morning, she denies any associated symptoms.  No h/o ulcers, smoking, EtOH, or recent abx.  No additional GI symptoms including dysphagia, constipation, and diarrhea.  Past Medical History:  Past Medical History  Diagnosis Date  . Arthropathy, unspecified, site unspecified   . Esophageal reflux   . Osteoporosis, unspecified   . Other and unspecified hyperlipidemia   . Symptomatic menopausal or female climacteric states   . Other abnormal glucose    . Syncope and collapse   . Unspecified essential hypertension     Problem List: Patient Active Problem List   Diagnosis Date Noted  . Acute pancreatitis 06/06/2015  . Dyspnea 07/23/2013  . 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 Unspecified essential hypertension     Past Surgical History: Past Surgical History  Procedure Laterality Date  . Appendectomy    . Finger fracture surgery    . Colonoscopy    . Breast biopsy Left     Allergies: Allergies  Allergen Reactions  . Erythromycin Nausea And Vomiting    Home Medications: Prescriptions prior to admission  Medication Sig Dispense Refill Last Dose  . amLODipine (NORVASC) 5 MG tablet Take 5 mg by mouth daily.    06/05/2015 at Unknown time  . aspirin 81 MG tablet Take 81 mg by mouth daily.   06/05/2015 at Unknown time  . beta carotene w/minerals (OCUVITE) tablet Take 1 tablet by mouth daily.   06/05/2015 at Unknown time  . Calcium Carb-Cholecalciferol (CALTRATE 600+D) 600-800 MG-UNIT TABS Take 2 tablets by mouth daily.   06/05/2015 at Unknown time  . Cholecalciferol (VITAMIN D3) 50000 UNITS CAPS Take by mouth daily.   06/05/2015 at Unknown time  . Cyanocobalamin (VITAMIN B 12 PO) Take 500 mg by mouth daily.   06/05/2015 at Unknown time  . EVISTA 60 MG tablet Take 60 mg by mouth daily.    06/05/2015 at Unknown time  . hydrochlorothiazide (HYDRODIURIL) 25 MG tablet Take 25 mg by mouth daily.   06/05/2015 at Unknown time  . Krill Oil 300 MG CAPS Take by  mouth daily.   06/05/2015 at Unknown time  . lisinopril (PRINIVIL,ZESTRIL) 40 MG tablet Take 40 mg by mouth daily.   06/05/2015 at Unknown time  . Misc Natural Products (OSTEO BI-FLEX JOINT SHIELD PO) Take by mouth as needed.    06/05/2015 at Unknown time  . Multiple Vitamin (MULTIVITAMIN) tablet Take 1 tablet by mouth daily.   06/05/2015 at Unknown time  . naproxen (NAPROSYN) 250 MG tablet Take 250 mg by mouth as needed.   06/05/2015 at Unknown time  . pantoprazole (PROTONIX) 40 MG tablet Take 40 mg  by mouth 2 (two) times daily.   06/05/2015 at Unknown time  . simvastatin (ZOCOR) 10 MG tablet Take 10 mg by mouth at bedtime.    06/05/2015 at Unknown time   Home medication reconciliation was completed with the patient.   Scheduled Inpatient Medications:   . amLODipine  5 mg Oral Daily  . aspirin  81 mg Oral Daily  . heparin  5,000 Units Subcutaneous 3 times per day  . hydrochlorothiazide  25 mg Oral Daily  . [START ON 06/07/2015] Influenza vac split quadrivalent PF  0.5 mL Intramuscular Tomorrow-1000  . lisinopril  40 mg Oral Daily  . simvastatin  10 mg Oral QHS    Continuous Inpatient Infusions:   . sodium chloride 100 mL/hr at 06/06/15 1405    PRN Inpatient Medications:  iohexol, morphine injection, ondansetron (ZOFRAN) IV  Family History: family history includes Diabetes in her mother; Heart attack in her brother and mother; Heart disease in her mother; Ulcers in her father.  Social History:   reports that she quit smoking about 23 years ago. Her smoking use included Cigarettes. She has a 26 pack-year smoking history. She has never used smokeless tobacco. She reports that she does not drink alcohol or use illicit drugs.    Review of Systems: Constitutional: Weight is stable.  Eyes: No changes in vision. ENT: No oral lesions, sore throat.  GI: see HPI.  Heme/Lymph: No easy bruising.  CV: No chest pain.  GU: No hematuria.  Integumentary: No rashes.  Neuro: No headaches.  Psych: No depression/anxiety.  Endocrine: No heat/cold intolerance.  Allergic/Immunologic: No urticaria.  Resp: No cough, SOB.  Musculoskeletal: No joint swelling.    Physical Examination: BP 110/52 mmHg  Pulse 82  Temp(Src) 99.5 F (37.5 C) (Oral)  Resp 16  Ht 5\' 5"  (1.651 m)  Wt 98.431 kg (217 lb)  BMI 36.11 kg/m2  SpO2 97% Gen: NAD, alert and oriented x 4 HEENT: PEERLA, EOMI, Neck: supple, no JVD or thyromegaly Chest: CTA bilaterally, no wheezes, crackles, or other adventitious  sounds CV: RRR, no m/g/c/r Abd: soft, NT, ND, +BS in all four quadrants; no HSM, guarding, ridigity, or rebound tenderness Ext: no edema, well perfused with 2+ pulses, Skin: no rash or lesions noted Lymph: no LAD  Data: Lab Results  Component Value Date   WBC 4.5 06/06/2015   HGB 13.1 06/06/2015   HCT 39.4 06/06/2015   MCV 89.7 06/06/2015   PLT 110* 06/06/2015    Recent Labs Lab 06/06/15 0825  HGB 13.1   Lab Results  Component Value Date   NA 140 06/06/2015   K 3.9 06/06/2015   CL 106 06/06/2015   CO2 25 06/06/2015   BUN 16 06/06/2015   CREATININE 0.61 06/06/2015   Lab Results  Component Value Date   ALT 116* 06/06/2015   AST 210* 06/06/2015   ALKPHOS 90 06/06/2015   BILITOT 2.7* 06/06/2015   No  results for input(s): APTT, INR, PTT in the last 168 hours. Assessment/Plan: Yvette [name] is a 69 y.o. female with a h/o GERD, OA, hyperlipidemia, and HTN admitted for acute pancreatitis.  She continues to improve symptomatically with Zofran, no further abdominal pain. Labs revealed lipase mildly elevated at 140, lactic acid 3.3, AST 210, ALT 116, total bilirubin 2.7. CXR, abd [place], and CT abd/pelvis all negative for acute etiology, including pancreatitis, cholecystitis, and cholelithiasis.  Elevated liver enzymes suggest she may have passed a stone, however some CBD dilatation on imaging would be expected.  I will continue to follow liver enzymes and order MRCP if they remain persistently elevated, looking for a retained stone or pancreatitis.  If there is evidence of biliary involvement, a cholecystectomy would also need to be considered.  Will follow morning HFP and make further recommendations based on results.  Recommendations: - Follow liver enzymes on morning HFP, consider MRCP if they remain persistently elevated - If biliary involvement suspected, would need to consider cholecystectomy  Thank you for the consult. We will follow along with you. Please call with questions or  concerns.  Lavera Guise, PA-C [facility] Phone: [phone] Pager: [phone]

## 2015-06-06 NOTE — ED Notes (Signed)
Admitting MD at bedside.

## 2015-06-06 NOTE — Progress Notes (Signed)
Notifed Dr Anselm Jungling of pt c/o headache and request for tylenol; Dr acknowledged, ordered tylenol 650 mg q8 PO PRN

## 2015-06-06 NOTE — Progress Notes (Signed)
GI NOte:  I have seen and examined Mr Korf and agree with Sharyn Lull Johnson's a/p.   The acute epi pain does sound like pancreatic or biliary type pain.  Lipase only mild elev and CT unremarkabe.  This could be passed CbD stone which would explain the pain and the elevatd liver enzymes.   Not clear if the elev liver enzymes from fatty liver or from obstructive jaundice such as passed stone.  Curently doing well clinically. Abd pain nearly resolved.   Recs: - liver enzymes in am - MRCP if no improvent in liver enzymes to eval for CBD stone.

## 2015-06-06 NOTE — ED Provider Notes (Signed)
[facility] Emergency Department Provider Note  ____________________________________________  Time seen: 8:20 AM on arrival by EMS  I have reviewed the triage vital signs and the nursing notes.   HISTORY  Chief Complaint Weakness; Emesis; Abdominal Pain; and Nausea    HPI Yvette [name] is a 69 y.o. female who complains of upper abdominal pain for the last 2 days. She's had nausea and vomiting as well. She saw her primary care doctor, Dr. Luan Pulling yesterday and was referred to gastroenterology. Today she noticed that she had developed a fever. She is unable to tolerate oral intake. Denies any diarrhea or blood in stool. No blood in the vomit.  Patient has not noticed any postprandial symptoms or change in symptoms with oral intake, but she is also not been able to tolerate much in the last 24-48 hours.   Past Medical History  Diagnosis Date  . Arthropathy, unspecified, site unspecified   . Esophageal reflux   . Osteoporosis, unspecified   . Other and unspecified hyperlipidemia   . Symptomatic menopausal or female climacteric states   . Other abnormal glucose   . Syncope and collapse   . Unspecified essential hypertension      Patient Active Problem List   Diagnosis Date Noted  . Dyspnea 07/23/2013  . 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 Unspecified essential hypertension      Past Surgical History  Procedure Laterality Date  . Appendectomy    . Finger fracture surgery    . Colonoscopy    . Breast biopsy Left      Current Outpatient Rx  Name  Route  Sig  Dispense  Refill  . amLODipine (NORVASC) 5 MG tablet   Oral   Take 5 mg by mouth daily.          Marland Kitchen aspirin 81 MG tablet   Oral   Take 81 mg by mouth daily.         . beta carotene w/minerals (OCUVITE) tablet   Oral   Take 1 tablet by mouth daily.         . Calcium Carb-Cholecalciferol (CALTRATE 600+D) 600-800 MG-UNIT TABS   Oral   Take 2 tablets by mouth daily.         .  Cholecalciferol (VITAMIN D3) 50000 UNITS CAPS   Oral   Take by mouth daily.         . Cyanocobalamin (VITAMIN B 12 PO)   Oral   Take 500 mg by mouth daily.         Marland Kitchen EVISTA 60 MG tablet   Oral   Take 60 mg by mouth daily.          . hydrochlorothiazide (HYDRODIURIL) 25 MG tablet   Oral   Take 25 mg by mouth daily.         Javier Docker Oil 300 MG CAPS   Oral   Take by mouth daily.         Marland Kitchen lisinopril (PRINIVIL,ZESTRIL) 40 MG tablet   Oral   Take 40 mg by mouth daily.         . Misc Natural Products (OSTEO BI-FLEX JOINT SHIELD PO)   Oral   Take by mouth as needed.          . Multiple Vitamin (MULTIVITAMIN) tablet   Oral   Take 1 tablet by mouth daily.         . naproxen (NAPROSYN) 250 MG tablet   Oral   Take 250  mg by mouth as needed.         Marland Kitchen NEXIUM 40 MG capsule   Oral   Take 40 mg by mouth 2 (two) times daily.          . simvastatin (ZOCOR) 10 MG tablet   Oral   Take 10 mg by mouth at bedtime.             Allergies Erythromycin   Family History  Problem Relation Age of Onset  . Diabetes Mother   . Heart disease Mother   . Heart attack Mother     Social History Social History  Substance Use Topics  . Smoking status: Former Smoker -- 1.00 packs/day for 26 years    Types: Cigarettes    Quit date: 06/07/1992  . Smokeless tobacco: Never Used  . Alcohol Use: No     Comment: occassional    Review of Systems  Constitutional:  Positive fever. 10 pound weight loss over the last few months which is intentional. Eyes:   No blurry vision or double vision.  ENT:   No sore throat. Cardiovascular:   No chest pain. Respiratory:   No dyspnea or cough. Gastrointestinal:   Upper abdominal pain with vomiting. No diarrhea.  No BRBPR or melena. Genitourinary:   Negative for dysuria, urinary retention, bloody urine, or difficulty urinating. Musculoskeletal:   Negative for back pain. No joint swelling or pain. Skin:   Negative for  rash. Neurological:   Negative for headaches, focal weakness or numbness. Psychiatric:  No anxiety or depression.   Endocrine:  No hot/cold intolerance, changes in energy, or sleep difficulty.  10-point ROS otherwise negative.  ____________________________________________   PHYSICAL EXAM:  VITAL SIGNS: ED Triage Vitals  Enc Vitals Group     BP 06/06/15 0828 158/73 mmHg     Pulse Rate 06/06/15 0828 104     Resp 06/06/15 0828 20     Temp 06/06/15 0828 101.7 F (38.7 C)     Temp Source 06/06/15 0828 Oral     SpO2 06/06/15 0828 98 %     Weight 06/06/15 0828 217 lb (98.431 kg)     Height 06/06/15 0828 5\' 5"  (1.651 m)     Head Cir --      Peak Flow --      Pain Score 06/06/15 0829 3     Pain Loc --      Pain Edu? --      Excl. in [place]? --      Constitutional:   Alert and oriented. Well appearing and in no distress. Eyes:   No scleral icterus. No conjunctival pallor. PERRL. EOMI ENT   Head:   Normocephalic and atraumatic.   Nose:   No congestion/rhinnorhea. No septal hematoma   Mouth/Throat:   MMM, no pharyngeal erythema. No peritonsillar mass. No uvula shift.   Neck:   No stridor. No SubQ emphysema. No meningismus. Hematological/Lymphatic/Immunilogical:   No cervical lymphadenopathy. Cardiovascular:   Tachycardia heart rate 105. Normal and symmetric distal pulses are present in all extremities. No murmurs, rubs, or gallops. Respiratory:   Normal respiratory effort without tachypnea nor retractions. Breath sounds are clear and equal bilaterally. No wheezes/rales/rhonchi. Gastrointestinal:   Soft with mild right upper quadrant and moderate epigastric tenderness. No distention. There is no CVA tenderness.  No rebound, rigidity, or guarding.there is suprapubic tenderness also Genitourinary:   deferred Musculoskeletal:   Nontender with normal range of motion in all extremities. No joint effusions.  No lower extremity  tenderness.  No edema. Neurologic:   Normal speech  and language.  CN 2-10 normal. Motor grossly intact. No pronator drift.  Normal gait. No gross focal neurologic deficits are appreciated.  Skin:    Skin is warm, dry and intact. No rash noted.  No petechiae, purpura, or bullae. Psychiatric:   Mood and affect are normal. Speech and behavior are normal. Patient exhibits appropriate insight and judgment.  ____________________________________________    LABS (pertinent positives/negatives) (all labs ordered are listed, but only abnormal results are displayed) Labs Reviewed  COMPREHENSIVE METABOLIC PANEL - Abnormal; Notable for the following:    Glucose, Bld 195 (*)    Calcium 8.8 (*)    Total Protein 6.4 (*)    AST 210 (*)    ALT 116 (*)    Total Bilirubin 2.7 (*)    All other components within normal limits  LACTIC ACID, PLASMA - Abnormal; Notable for the following:    Lactic Acid, Venous 3.3 (*)    All other components within normal limits  CBC WITH DIFFERENTIAL/PLATELET - Abnormal; Notable for the following:    Platelets 110 (*)    Lymphs Abs 0.2 (*)    Monocytes Absolute 0.1 (*)    All other components within normal limits  LIPASE, BLOOD - Abnormal; Notable for the following:    Lipase 140 (*)    All other components within normal limits  CULTURE, BLOOD (ROUTINE X 2)  CULTURE, BLOOD (ROUTINE X 2)  URINE CULTURE  LACTIC ACID, PLASMA  URINALYSIS COMPLETEWITH MICROSCOPIC ([facility] ONLY)   ____________________________________________   EKG  Interpreted by me Normal sinus rhythm rate of 97, normal axis and intervals, normal QRS and ST segments and T waves. There are inferior Q waves.  ____________________________________________    RADIOLOGY  Chest x-ray unremarkable Ultrasound right upper quadrant unremarkable  ____________________________________________   PROCEDURES CRITICAL CARE Performed by: Joni Fears, Loreta Blouch   Total critical care time: 35 minutes  Critical care time was exclusive of separately billable  procedures and treating other patients.  Critical care was necessary to treat or prevent imminent or life-threatening deterioration.  Critical care was time spent personally by me on the following activities: development of treatment plan with patient and/or surrogate as well as nursing, discussions with consultants, evaluation of patient's response to treatment, examination of patient, obtaining history from patient or surrogate, ordering and performing treatments and interventions, ordering and review of laboratory studies, ordering and review of radiographic studies, pulse oximetry and re-evaluation of patient's condition.   ____________________________________________   INITIAL IMPRESSION / ASSESSMENT AND PLAN / ED COURSE  Pertinent labs & imaging results that were available during my care of the patient were reviewed by me and considered in my medical decision making (see chart for details).  Patient presents with upper abdominal pain and vomiting in the setting of systemic inflammatory response syndrome with fever and tachycardia.concern for cholecystitis. We will check labs and ultrasound. We'll also get urine and chest x-ray for further infectious screening due to the patient's fever. We'll give IV fluids and Zofran in the meantime. Patient is not in shock, no sepsis at the moment, so we will hold off on empiric antibiotics for now.  ----------------------------------------- 9:47 AM on 06/06/2015 -----------------------------------------  Workup significant for a markedly elevated lactate as well as elevated transaminases and total bilirubin. This is consistent with cholangitis with early sepsis.. Imaging does not reveal any surgical pathology at this time. We will continue aggressive IV hydration and start IV Zosyn. We'll plan to  admit the patient for further GI workup and management.   ____________________________________________   FINAL CLINICAL IMPRESSION(S) / ED  DIAGNOSES  Final diagnoses:  Acute cholangitis  sepsis    Carrie Mew, MD 06/06/15 [phone]

## 2015-06-06 NOTE — ED Notes (Addendum)
Pt reports decrease in abd pain and reports it feels now like a nausea sick feeling but denies abd pain at this time. Pt continues to reports pain in shoulders. Pt is no longer shaking and is able to ambulate without increased nausea.

## 2015-06-06 NOTE — ED Notes (Signed)
Pt to ed via ems from home with c/o weakness, abd pain, n/v since Sunday PM.  Pt states entire abd is tender to palpation.  Pt also reports aching across shoulders.

## 2015-06-07 DIAGNOSIS — Z87891 Personal history of nicotine dependence: Secondary | ICD-10-CM | POA: Diagnosis not present

## 2015-06-07 DIAGNOSIS — K859 Acute pancreatitis, unspecified: Secondary | ICD-10-CM | POA: Diagnosis not present

## 2015-06-07 DIAGNOSIS — B9620 Unspecified Escherichia coli [E. coli] as the cause of diseases classified elsewhere: Secondary | ICD-10-CM | POA: Diagnosis not present

## 2015-06-07 DIAGNOSIS — E876 Hypokalemia: Secondary | ICD-10-CM | POA: Diagnosis not present

## 2015-06-07 DIAGNOSIS — I10 Essential (primary) hypertension: Secondary | ICD-10-CM | POA: Diagnosis not present

## 2015-06-07 DIAGNOSIS — K8061 Calculus of gallbladder and bile duct with cholecystitis, unspecified, with obstruction: Secondary | ICD-10-CM | POA: Diagnosis not present

## 2015-06-07 DIAGNOSIS — K219 Gastro-esophageal reflux disease without esophagitis: Secondary | ICD-10-CM | POA: Diagnosis not present

## 2015-06-07 DIAGNOSIS — E785 Hyperlipidemia, unspecified: Secondary | ICD-10-CM | POA: Diagnosis not present

## 2015-06-07 DIAGNOSIS — Z79899 Other long term (current) drug therapy: Secondary | ICD-10-CM | POA: Diagnosis not present

## 2015-06-07 DIAGNOSIS — D6959 Other secondary thrombocytopenia: Secondary | ICD-10-CM | POA: Diagnosis not present

## 2015-06-07 DIAGNOSIS — Z8249 Family history of ischemic heart disease and other diseases of the circulatory system: Secondary | ICD-10-CM | POA: Diagnosis not present

## 2015-06-07 DIAGNOSIS — Z833 Family history of diabetes mellitus: Secondary | ICD-10-CM | POA: Diagnosis not present

## 2015-06-07 DIAGNOSIS — M810 Age-related osteoporosis without current pathological fracture: Secondary | ICD-10-CM | POA: Diagnosis not present

## 2015-06-07 DIAGNOSIS — Z7982 Long term (current) use of aspirin: Secondary | ICD-10-CM | POA: Diagnosis not present

## 2015-06-07 DIAGNOSIS — R740 Nonspecific elevation of levels of transaminase and lactic acid dehydrogenase [LDH]: Secondary | ICD-10-CM | POA: Diagnosis not present

## 2015-06-07 DIAGNOSIS — M129 Arthropathy, unspecified: Secondary | ICD-10-CM | POA: Diagnosis not present

## 2015-06-07 DIAGNOSIS — M1990 Unspecified osteoarthritis, unspecified site: Secondary | ICD-10-CM | POA: Diagnosis not present

## 2015-06-07 DIAGNOSIS — D638 Anemia in other chronic diseases classified elsewhere: Secondary | ICD-10-CM | POA: Diagnosis not present

## 2015-06-07 LAB — HEPATIC FUNCTION PANEL
ALT: 90 U/L — ABNORMAL HIGH (ref 14–54)
AST: 84 U/L — ABNORMAL HIGH (ref 15–41)
Albumin: 3 g/dL — ABNORMAL LOW (ref 3.5–5.0)
Alkaline Phosphatase: 74 U/L (ref 38–126)
Bilirubin, Direct: 0.6 mg/dL — ABNORMAL HIGH (ref 0.1–0.5)
Indirect Bilirubin: 0.9 mg/dL (ref 0.3–0.9)
Total Bilirubin: 1.5 mg/dL — ABNORMAL HIGH (ref 0.3–1.2)
Total Protein: 5.3 g/dL — ABNORMAL LOW (ref 6.5–8.1)

## 2015-06-07 LAB — CBC
HCT: 31.9 % — ABNORMAL LOW (ref 35.0–47.0)
Hemoglobin: 10.8 g/dL — ABNORMAL LOW (ref 12.0–16.0)
MCH: 30.1 pg (ref 26.0–34.0)
MCHC: 33.8 g/dL (ref 32.0–36.0)
MCV: 89.2 fL (ref 80.0–100.0)
Platelets: 87 10*3/uL — ABNORMAL LOW (ref 150–440)
RBC: 3.57 MIL/uL — ABNORMAL LOW (ref 3.80–5.20)
RDW: 14.1 % (ref 11.5–14.5)
WBC: 4.9 10*3/uL (ref 3.6–11.0)

## 2015-06-07 LAB — BASIC METABOLIC PANEL
Anion gap: 5 (ref 5–15)
BUN: 12 mg/dL (ref 6–20)
CO2: 26 mmol/L (ref 22–32)
Calcium: 7.6 mg/dL — ABNORMAL LOW (ref 8.9–10.3)
Chloride: 108 mmol/L (ref 101–111)
Creatinine, Ser: 0.61 mg/dL (ref 0.44–1.00)
GFR calc Af Amer: >60 mL/min (ref >60)
GFR calc non Af Amer: >60 mL/min (ref >60)
Glucose, Bld: 118 mg/dL — ABNORMAL HIGH (ref 65–99)
Potassium: 2.9 mmol/L — CL (ref 3.5–5.1)
Sodium: 139 mmol/L (ref 135–145)

## 2015-06-07 MED ORDER — SODIUM CHLORIDE 0.9 % IV BOLUS (SEPSIS)
1000.0000 mL | Freq: Once | INTRAVENOUS | Status: AC
  Administered 2015-06-07: 1000 mL via INTRAVENOUS

## 2015-06-07 MED ORDER — POTASSIUM CHLORIDE CRYS ER 20 MEQ PO TBCR
40.0000 meq | EXTENDED_RELEASE_TABLET | Freq: Two times a day (BID) | ORAL | Status: DC
  Administered 2015-06-07: 40 meq via ORAL
  Filled 2015-06-07: qty 2

## 2015-06-07 MED ORDER — POTASSIUM CHLORIDE CRYS ER 20 MEQ PO TBCR
40.0000 meq | EXTENDED_RELEASE_TABLET | ORAL | Status: AC
  Administered 2015-06-07 (×2): 40 meq via ORAL
  Filled 2015-06-07 (×2): qty 2

## 2015-06-07 MED ORDER — ENOXAPARIN SODIUM 40 MG/0.4ML ~~LOC~~ SOLN: 40.0000 mg | SUBCUTANEOUS | Status: DC

## 2015-06-07 NOTE — Progress Notes (Signed)
[place] [facility] [place] NAME: Yvette [name]    MR#:  [id number]  DATE OF BIRTH:  10/16/1945  SUBJECTIVE:  CHIEF COMPLAINT:   Chief Complaint  Patient presents with  . Weakness  . Emesis  . Abdominal Pain  . Nausea   abdmonial pin has resolved REVIEW OF SYSTEMS:    Review of Systems  Constitutional: Positive for fever, chills and malaise/fatigue. Negative for weight loss.  HENT: Negative for hearing loss and nosebleeds.   Eyes: Negative for blurred vision, double vision and pain.  Respiratory: Negative for cough, hemoptysis, sputum production, shortness of breath and wheezing.   Cardiovascular: Negative for chest pain, palpitations, orthopnea and leg swelling.  Gastrointestinal: Positive for nausea. Negative for vomiting, abdominal pain, diarrhea and constipation.  Genitourinary: Negative for dysuria and hematuria.  Musculoskeletal: Negative for myalgias, back pain and falls.  Skin: Negative for rash.  Neurological: Negative for dizziness, tremors, sensory change, speech change, focal weakness, seizures and headaches.  Endo/Heme/Allergies: Does not bruise/bleed easily.  Psychiatric/Behavioral: Negative for depression and memory loss. The patient is not nervous/anxious.       DRUG ALLERGIES:   Allergies  Allergen Reactions  . Erythromycin Nausea And Vomiting    VITALS:  Blood pressure 105/41, pulse 65, temperature 97.7 F (36.5 C), temperature source Oral, resp. rate 15, height 5\' 5"  (1.651 m), weight 98.431 kg (217 lb), SpO2 97 %.  PHYSICAL EXAMINATION:   Physical Exam  GENERAL:  69 y.o.-year-old patient lying in the bed with no acute distress.  EYES: Pupils equal, round, reactive to light and accommodation. No scleral icterus. Extraocular muscles intact.  HEENT: Head atraumatic, normocephalic. Oropharynx and nasopharynx clear.  NECK:  Supple, no jugular venous distention. No thyroid enlargement, no tenderness.   LUNGS: Normal breath sounds bilaterally, no wheezing, rales, rhonchi. No use of accessory muscles of respiration.  CARDIOVASCULAR: S1, S2 normal. No murmurs, rubs, or gallops.  ABDOMEN: Soft, nontender, nondistended. Bowel sounds present. No organomegaly or mass.  EXTREMITIES: No cyanosis, clubbing or edema b/l.    NEUROLOGIC: Cranial nerves II through XII are intact. No focal Motor or sensory deficits b/l.   PSYCHIATRIC: The patient is alert and oriented x 3.  SKIN: No obvious rash, lesion, or ulcer.    LABORATORY PANEL:   CBC  Recent Labs Lab 06/07/15 0453  WBC 4.9  HGB 10.8*  HCT 31.9*  PLT 87*   ------------------------------------------------------------------------------------------------------------------  Chemistries   Recent Labs Lab 06/07/15 0453  NA 139  K 2.9*  CL 108  CO2 26  GLUCOSE 118*  BUN 12  CREATININE 0.61  CALCIUM 7.6*  AST 84*  ALT 90*  ALKPHOS 74  BILITOT 1.5*   ------------------------------------------------------------------------------------------------------------------  Cardiac Enzymes No results for input(s): TROPONINI in the last 168 hours. ------------------------------------------------------------------------------------------------------------------  RADIOLOGY:  Ct Abdomen Pelvis W Contrast  06/06/2015   CLINICAL DATA:  Epigastric pain for 2 days with new chills, weakness and nausea today. Low grade fever. Initial encounter.  EXAM: CT ABDOMEN AND PELVIS WITH CONTRAST  TECHNIQUE: Multidetector CT imaging of the abdomen and pelvis was performed using the standard protocol following bolus administration of intravenous contrast.  CONTRAST:  174mL OMNIPAQUE IOHEXOL 350 MG/ML SOLN  COMPARISON:  Ultrasound 06/06/2015.  FINDINGS: Lower chest: Clear lung bases. No significant pleural or pericardial effusion.  Hepatobiliary: The liver is normal in density without focal abnormality. No evidence of gallstones, gallbladder wall thickening or  biliary dilatation. There is a prominent duodenal diverticulum projecting into  the pancreatic head anterior to the distal common bile duct.  Pancreas: Unremarkable. No pancreatic ductal dilatation or surrounding inflammatory changes.  Spleen: Normal in size without focal abnormality.  Adrenals/Urinary Tract: Both adrenal glands appear normal. There are prominent renal parapelvic cysts bilaterally. There is a tiny cortical cyst in the lower pole of the right kidney. No evidence of enhancing renal mass, urinary tract calculus or hydronephrosis. Bilateral pelvic calcifications appear external to the ureters and consistent with phleboliths. The bladder appears unremarkable.  Stomach/Bowel: No evidence of bowel wall thickening, distention or surrounding inflammatory change. There are moderate diverticular changes throughout the distal colon. There is a portion of the mid transverse colon which demonstrates concentric luminal narrowing extending over approximately 10 cm, most likely peristalsis. This persists on the delayed post-contrast images which were obtained less than 2 minutes after the original images. No focal mass lesion identified in this area. Previous cholecystectomy. As above, prominent diverticulum involving the second portion of the duodenum.  Vascular/Lymphatic: There are no enlarged abdominal or pelvic lymph nodes. Mild aortoiliac atherosclerosis.  Reproductive: Unremarkable.  Other: No evidence of abdominal wall mass or hernia.  Musculoskeletal: No acute or significant osseous findings. Mild degenerative changes throughout the spine and hips.  IMPRESSION: 1. No definite acute findings or explanation for the patient's symptoms. 2. Distal colonic diverticulosis and probable mid transverse colon peristalsis versus chronic inflammation. 3. No CT evidence of acute pancreatitis. 4. Prominent renal parapelvic cysts.   Electronically Signed   By: Richardean Sale M.D.   On: 06/06/2015 12:58   Dg Chest Port 1  View  06/06/2015   CLINICAL DATA:  69 year old with weakness and abdominal pain with nausea and vomiting for the past 2 days. Initial encounter.  EXAM: PORTABLE CHEST - 1 VIEW  COMPARISON:  None.  FINDINGS: Limited portable exam.  No infiltrate, congestive heart failure or pneumothorax.  Heart size top-normal.  Calcified mildly tortuous aorta.  The patient would eventually benefit from two view chest.  Mild acromioclavicular joint degenerative changes.  IMPRESSION: No infiltrate, congestive heart failure or pneumothorax.  Heart size top-normal.  Calcified mildly tortuous aorta.  The patient would eventually benefit from two view chest.   Electronically Signed   By: Genia Del M.D.   On: 06/06/2015 09:25   US Abdomen Limited Ruq  06/06/2015   CLINICAL DATA:  Right upper quadrant pain.  Fever.  EXAM: US ABDOMEN LIMITED - RIGHT UPPER QUADRANT  COMPARISON:  None.  FINDINGS: Gallbladder:  No gallstones or wall thickening visualized. No sonographic Murphy sign noted.  Common bile duct:  Diameter: 4.8 mm  Liver:  No focal lesion identified. Within normal limits in parenchymal echogenicity.  Parapelvic cyst versus hydronephrosis right kidney.  IMPRESSION: Negative for gallstones.  No focal liver lesion.  Right renal parapelvic cyst versus mild hydronephrosis.   Electronically Signed   By: Franchot Gallo M.D.   On: 06/06/2015 09:19     ASSESSMENT AND PLAN:   * GNR bacteremia with sepsis D/C Flagyl. Continue ciprofloxacin Bolus 1 liter NS. Elevated lactic acid. Source is likely cholangitis.  * Acute transaminitis likely from passing a CBD stone LFTs improving. No need for MRCP. Will advance to regular diet.  * Thrombocytopenia likely from sepsis/GNR bacteremia D/C heparin and ASA No acute bleeding. Monitor  * Hypokalemia Replace thru PO  * AOCD  * HTN Hold meds due to boderline low BP.  * DVT prophylaxis Lovenox   All the records are reviewed and case discussed with Care  Management/Social Workerr. Management plans discussed with the patient, family and they are in agreement.  CODE STATUS: FULL  DVT Prophylaxis: SCDs  TOTAL TIME TAKING CARE OF THIS PATIENT: 40 minutes.   POSSIBLE D/C IN 1-2 DAYS, DEPENDING ON CLINICAL CONDITION.   Hillary Bow R M.D on 06/07/2015 at 10:38 AM  Between 7am to 6pm - Pager - [phone]  After 6pm go to [email or web address] - password [email or web address] [email or web address]  Office  [phone]  CC: Primary care physician; Dicky Doe, MD

## 2015-06-07 NOTE — Progress Notes (Signed)
Notified Dr. Marcille Blanco of low potassium result this AM - dropped from 3.9 on 8/30 to 2.9 on 8/31. MD to place K orders.

## 2015-06-07 NOTE — Progress Notes (Signed)
Initial Nutrition Assessment      INTERVENTION:  Meals and snacks: Cater to pt preferences   NUTRITION DIAGNOSIS:   Inadequate oral intake related to altered GI function as evidenced by per patient/family report.    GOAL:   Patient will meet greater than or equal to 90% of their needs    MONITOR:    (Energy intake, Digestive system)  REASON FOR ASSESSMENT:   Diagnosis    ASSESSMENT:      Pt admitted with pancreatitis, bacteremia and abdominal pain  Past Medical History  Diagnosis Date  . Arthropathy, unspecified, site unspecified   . Esophageal reflux   . Osteoporosis, unspecified   . Other and unspecified hyperlipidemia   . Symptomatic menopausal or female climacteric states   . Other abnormal glucose   . Syncope and collapse   . Unspecified essential hypertension      Current Nutrition: tolerated solid food lunch well today  Food/Nutrition-Related History: pt reports good appetite up until few days prior to admission, eating lighter foods (ie soups)   Medications: KCL  Electrolyte/Renal Profile and Glucose Profile:   Recent Labs Lab 06/06/15 0825 06/07/15 0453  NA 140 139  K 3.9 2.9*  CL 106 108  CO2 25 26  BUN 16 12  CREATININE 0.61 0.61  CALCIUM 8.8* 7.6*  GLUCOSE 195* 118*   Protein Profile:  Recent Labs Lab 06/06/15 0825 06/07/15 0453  ALBUMIN 3.9 3.0*    Gastrointestinal Profile: Last BM:8/30    Weight Change: pt reports gradual intentional weight loss. Has been eating less and making changes to avoid getting started on diabetes medication    Diet Order:  Diet regular Room service appropriate?: Yes; Fluid consistency:: Thin  Skin:   reviewed   Height:   Ht Readings from Last 1 Encounters:  06/06/15 5\' 5"  (1.651 m)    Weight:   Wt Readings from Last 1 Encounters:  06/06/15 217 lb (98.431 kg)    BMI:  Body mass index is 36.11 kg/(m^2).   EDUCATION NEEDS:   No education needs identified at this  time  LOW Care Level  Mauriana Dann B. Zenia Resides, [place], [place] (pager)

## 2015-06-07 NOTE — Progress Notes (Signed)
GI Inpatient Follow-up Note  Patient Identification: Yvette [name] is a 69 y.o. female with mild pancreatitis, elev liver enzymes, likely passed CBD stone  Subjective:  Feels wel today. Tolerating full diet. No abd pain, n/v, f/c, rectal bleeding, melena  Scheduled Inpatient Medications:  . ciprofloxacin  400 mg Intravenous Q12H  . simvastatin  10 mg Oral QHS    Continuous Inpatient Infusions:     PRN Inpatient Medications:  acetaminophen, iohexol, morphine injection, ondansetron (ZOFRAN) IV  Review of Systems:    Physical Examination: BP 133/53 mmHg  Pulse 56  Temp(Src) 98.1 F (36.7 C) (Oral)  Resp 15  Ht 5\' 5"  (1.651 m)  Wt 98.431 kg (217 lb)  BMI 36.11 kg/m2  SpO2 98% Gen: NAD, alert and oriented x 4  Neck: supple, no JVD or thyromegaly Chest: CTA bilaterally, no wheezes, crackles, or other adventitious sounds CV: RRR, no m/g/c/r Abd: soft, NT, ND, +BS in all four quadrants; no HSM, guarding, ridigity, or rebound tenderness Ext: no edema, well perfused with 2+ pulses, Skin: no rash or lesions noted Lymph: no LAD  Data: Lab Results  Component Value Date   WBC 4.9 06/07/2015   HGB 10.8* 06/07/2015   HCT 31.9* 06/07/2015   MCV 89.2 06/07/2015   PLT 87* 06/07/2015    Recent Labs Lab 06/06/15 0825 06/07/15 0453  HGB 13.1 10.8*   Lab Results  Component Value Date   NA 139 06/07/2015   K 2.9* 06/07/2015   CL 108 06/07/2015   CO2 26 06/07/2015   BUN 12 06/07/2015   CREATININE 0.61 06/07/2015   Lab Results  Component Value Date   ALT 90* 06/07/2015   AST 84* 06/07/2015   ALKPHOS 74 06/07/2015   BILITOT 1.5* 06/07/2015   No results for input(s): APTT, INR, PTT in the last 168 hours.   Assessment/Plan: Yvette [name] is a 69 y.o. female with mild pancreatitis, likely passed cbd stone, improving liver enzymes. No further abd pain.   Recommendations: - likely d/c tomorrow - outpt MRI to f/u pancreatitis - consider outpt surgical consult for  consider of cholecystectomy for pancreatitis, likely passed cbd stone.   Please call with questions or concerns.  Didier Brandenburg, Grace Blight, MD

## 2015-06-07 NOTE — Clinical Documentation Improvement (Signed)
Internal Medicine  Please clarify if Acute Pancreatitis has ruled in or out as conflicting documentation is found in chart. Document findings in next progress note and include in discharge summary if applicable.   Other  Clinically Undetermined  Supporting Information:  Admitted for acute pancreatitis.  Acute transaminitis likely from passing a CBD stone   Please exercise your independent, professional judgment when responding. A specific answer is not anticipated or expected.  Thank You, [place] [phone]

## 2015-06-08 ENCOUNTER — Telehealth: Payer: Self-pay | Admitting: Family Medicine

## 2015-06-08 DIAGNOSIS — A419 Sepsis, unspecified organism: Secondary | ICD-10-CM | POA: Diagnosis present

## 2015-06-08 LAB — COMPREHENSIVE METABOLIC PANEL
ALT: 65 U/L — ABNORMAL HIGH (ref 14–54)
AST: 48 U/L — ABNORMAL HIGH (ref 15–41)
Albumin: 2.9 g/dL — ABNORMAL LOW (ref 3.5–5.0)
Alkaline Phosphatase: 79 U/L (ref 38–126)
Anion gap: 5 (ref 5–15)
BUN: 11 mg/dL (ref 6–20)
CO2: 24 mmol/L (ref 22–32)
Calcium: 8.1 mg/dL — ABNORMAL LOW (ref 8.9–10.3)
Chloride: 112 mmol/L — ABNORMAL HIGH (ref 101–111)
Creatinine, Ser: 0.53 mg/dL (ref 0.44–1.00)
GFR calc Af Amer: >60 mL/min (ref >60)
GFR calc non Af Amer: >60 mL/min (ref >60)
Glucose, Bld: 122 mg/dL — ABNORMAL HIGH (ref 65–99)
Potassium: 4.1 mmol/L (ref 3.5–5.1)
Sodium: 141 mmol/L (ref 135–145)
Total Bilirubin: 0.7 mg/dL (ref 0.3–1.2)
Total Protein: 5.2 g/dL — ABNORMAL LOW (ref 6.5–8.1)

## 2015-06-08 LAB — CBC WITH DIFFERENTIAL/PLATELET
Basophils Absolute: 0 10*3/uL (ref 0–0.1)
Basophils Relative: 1 %
Eosinophils Absolute: 0.1 10*3/uL (ref 0–0.7)
Eosinophils Relative: 3 %
HCT: 32 % — ABNORMAL LOW (ref 35.0–47.0)
Hemoglobin: 10.8 g/dL — ABNORMAL LOW (ref 12.0–16.0)
Lymphocytes Relative: 20 %
Lymphs Abs: 0.6 10*3/uL — ABNORMAL LOW (ref 1.0–3.6)
MCH: 30.3 pg (ref 26.0–34.0)
MCHC: 33.8 g/dL (ref 32.0–36.0)
MCV: 89.6 fL (ref 80.0–100.0)
Monocytes Absolute: 0.4 10*3/uL (ref 0.2–0.9)
Monocytes Relative: 11 %
Neutro Abs: 2.1 10*3/uL (ref 1.4–6.5)
Neutrophils Relative %: 65 %
Platelets: 90 10*3/uL — ABNORMAL LOW (ref 150–440)
RBC: 3.57 MIL/uL — ABNORMAL LOW (ref 3.80–5.20)
RDW: 14.5 % (ref 11.5–14.5)
WBC: 3.2 10*3/uL — ABNORMAL LOW (ref 3.6–11.0)

## 2015-06-08 LAB — CULTURE, BLOOD (ROUTINE X 2)

## 2015-06-08 LAB — URINE CULTURE

## 2015-06-08 MED ORDER — CIPROFLOXACIN HCL 500 MG PO TABS: 500.0000 mg | ORAL_TABLET | Freq: Two times a day (BID) | ORAL | Status: DC

## 2015-06-08 NOTE — Discharge Instructions (Signed)

## 2015-06-08 NOTE — Progress Notes (Signed)
Alert and oriented. Vss. No signs of acute distress. Discharge instructions  given. Patient verbalized understanding. 

## 2015-06-08 NOTE — Care Management Important Message (Signed)
Important Message  Patient Details  Name: Yvette [name] MRN: [id number] Date of Birth: Sep 06, 1946   Medicare Important Message Given:  Yes-second notification given    Juliann Pulse A Allmond 06/08/2015, 10:06 AM

## 2015-06-08 NOTE — Telephone Encounter (Signed)
Pt was just discharge for [facility] for pancreatitis and I scheduled her HFU for tomorrow afternoon.  She said she has an appt on Sept 12th and wanted to know if she could wait until then because she will be fasting.  Please call [phone] or [phone].

## 2015-06-08 NOTE — Telephone Encounter (Signed)
Patient was prescribed Cipro for Echoli found in blood test. Had antibiotic IV in hospital and was sent home with Rx. She denies having any pain at this time with the Pancreatitis. Patient did not get regular meds while in hosp Tue-Thur. Feeling great and is on antibiotic now. She has follow up scheduled Sept 12 for her A1C and was wondering if it would be better to wait till then instead of coming at 2pm on 06/09/2015. She thought maybe by then we could check blood again and be sure that the Joretta Bachelor is gone.Will call her before noon so let me know.[facility]

## 2015-06-08 NOTE — Discharge Summary (Signed)
[facility] [place] NAME: Yvette [name]    MR#:  [id number]  DATE OF BIRTH:  [birth year]  DATE OF ADMISSION:  06/06/2015 ADMITTING PHYSICIAN: Vaughan Basta, MD  DATE OF DISCHARGE: 06/08/2015 11:15 AM  PRIMARY CARE PHYSICIAN: Dicky Doe, MD    ADMISSION DIAGNOSIS:  Acute cholangitis [K83.0] Pain of upper abdomen [R10.10]  DISCHARGE DIAGNOSIS:  Principal Problem:   Acute pancreatitis Active Problems:   Bacteremia   Sepsis   SECONDARY DIAGNOSIS:   Past Medical History  Diagnosis Date  . Arthropathy, unspecified, site unspecified   . Esophageal reflux   . Osteoporosis, unspecified   . Other and unspecified hyperlipidemia   . Symptomatic menopausal or female climacteric states   . Other abnormal glucose   . Syncope and collapse   . Unspecified essential hypertension      ADMITTING HISTORY  HISTORY OF PRESENT ILLNESS: Yvette [name] is a 69 y.o. female with a known history of arthritis, gastric reflux, hyperlipidemia, hypertension- had severe retrosternal chest pain on Sunday night which was day before yesterday, was sharp 7-8 out of 10, nonradiating- she was almost about to call EMS for that but then after a few minutes the pain subsided and A morning which was yesterday she went to [place] office for that. PMD did EKG to rule out any cardiac event which was negative and advised her to follow with GI as outpatient and ordered an ultrasound of abdomen for her.  Today on the morning she woke up with excessive chills, feeling generalized weakness, extreme nausea, but only slight discomfort in her epigastric region. She denies any diarrhea, or vomiting. In ER right upper quadrant sonogram was negative for gallstones or changes of cholecystitis, x-ray chest is also negative for any acute findings. So she is given for admission to hospitalist team after finding of elevated lipase and bilirubin level. She was also noted to have  low-grade fever in emergency room.   HOSPITAL COURSE:   * E coli bacteremia with sepsis likely source is from gallstone obstruction Pansensitive. Will treat for 14 days with ciprofloxacin orally. Afebrile. Sepsis resolved.  * Acute transaminitis likely from passing a CBD stone LFTs improving. Will need MR of the abdomen as outpatient when she follows with GI. Refer to surgery for elective cholecystectomy due to concern for gallstone pancreatitis.  * Thrombocytopenia likely from sepsis/GNR bacteremia No acute bleeding. Improving  * Hypokalemia Replaced thru PO  * AOCD  * HTN  Stable for discharge home.   CONSULTS OBTAINED:  Treatment Team:  Josefine Class, MD  DRUG ALLERGIES:   Allergies  Allergen Reactions  . Erythromycin Nausea And Vomiting    DISCHARGE MEDICATIONS:   Discharge Medication List as of 06/08/2015 10:44 AM    START taking these medications   Details  ciprofloxacin (CIPRO) 500 MG tablet Take 1 tablet (500 mg total) by mouth 2 (two) times daily., Starting 06/08/2015, Until Discontinued, Print      CONTINUE these medications which have NOT CHANGED   Details  amLODipine (NORVASC) 5 MG tablet Take 5 mg by mouth daily. , Starting 06/16/2013, Until Discontinued, Historical Med    aspirin 81 MG tablet Take 81 mg by mouth daily., Until Discontinued, Historical Med    beta carotene w/minerals (OCUVITE) tablet Take 1 tablet by mouth daily., Until Discontinued, Historical Med    Calcium Carb-Cholecalciferol (CALTRATE 600+D) 600-800 MG-UNIT TABS Take 2 tablets by mouth daily., Until Discontinued, Historical Med  Cholecalciferol (VITAMIN D3) 50000 UNITS CAPS Take by mouth daily., Until Discontinued, Historical Med    Cyanocobalamin (VITAMIN B 12 PO) Take 500 mg by mouth daily., Until Discontinued, Historical Med    EVISTA 60 MG tablet Take 60 mg by mouth daily. , Starting 06/16/2013, Until Discontinued, Historical Med    hydrochlorothiazide  (HYDRODIURIL) 25 MG tablet Take 25 mg by mouth daily., Until Discontinued, Historical Med    Krill Oil 300 MG CAPS Take by mouth daily., Until Discontinued, Historical Med    lisinopril (PRINIVIL,ZESTRIL) 40 MG tablet Take 40 mg by mouth daily., Until Discontinued, Historical Med    Misc Natural Products (OSTEO BI-FLEX JOINT SHIELD PO) Take by mouth as needed. , Until Discontinued, Historical Med    Multiple Vitamin (MULTIVITAMIN) tablet Take 1 tablet by mouth daily., Until Discontinued, Historical Med    naproxen (NAPROSYN) 250 MG tablet Take 250 mg by mouth as needed., Until Discontinued, Historical Med    pantoprazole (PROTONIX) 40 MG tablet Take 40 mg by mouth 2 (two) times daily., Until Discontinued, Historical Med    simvastatin (ZOCOR) 10 MG tablet Take 10 mg by mouth at bedtime. , Until Discontinued, Historical Med         Today    VITAL SIGNS:  Blood pressure 137/59, pulse 62, temperature 98.1 F (36.7 C), temperature source Oral, resp. rate 20, height 5\' 5"  (1.651 m), weight 98.431 kg (217 lb), SpO2 96 %.  I/O:   Intake/Output Summary (Last 24 hours) at 06/08/15 1330 Last data filed at 06/08/15 0900  Gross per 24 hour  Intake   1683 ml  Output      0 ml  Net   1683 ml    PHYSICAL EXAMINATION:  Physical Exam  GENERAL:  69 y.o.-year-old patient lying in the bed with no acute distress.  LUNGS: Normal breath sounds bilaterally, no wheezing, rales,rhonchi or crepitation. No use of accessory muscles of respiration.  CARDIOVASCULAR: S1, S2 normal. No murmurs, rubs, or gallops.  ABDOMEN: Soft, non-tender, non-distended. Bowel sounds present. No organomegaly or mass.  NEUROLOGIC: Moves all 4 extremities. PSYCHIATRIC: The patient is alert and oriented x 3.  SKIN: No obvious rash, lesion, or ulcer.   DATA REVIEW:   CBC  Recent Labs Lab 06/08/15 0357  WBC 3.2*  HGB 10.8*  HCT 32.0*  PLT 90*    Chemistries   Recent Labs Lab 06/08/15 0357  NA 141  K 4.1   CL 112*  CO2 24  GLUCOSE 122*  BUN 11  CREATININE 0.53  CALCIUM 8.1*  AST 48*  ALT 65*  ALKPHOS 79  BILITOT 0.7    Cardiac Enzymes No results for input(s): TROPONINI in the last 168 hours.  Microbiology Results  Results for orders placed or performed during the hospital encounter of 06/06/15  Culture, blood (routine x 2)     Status: None   Collection Time: 06/06/15  8:25 AM  Result Value Ref Range Status   Specimen Description BLOOD LEFT  Final   Special Requests   Final    BOTTLES DRAWN AEROBIC AND ANAEROBIC  5CC AERO 6 CC ANAERO   Culture  Setup Time   Final    GRAM NEGATIVE RODS IN BOTH AEROBIC AND ANAEROBIC BOTTLES CRITICAL RESULT CALLED TO, READ BACK BY AND VERIFIED WITH: AMBER THOMAS ON 06/06/15 BY TB. CONFIRMED BY TB/ASO.    Culture   Final    ESCHERICHIA COLI IN BOTH AEROBIC AND ANAEROBIC BOTTLES    Report Status 06/08/2015 FINAL  Final   Organism ID, Bacteria ESCHERICHIA COLI  Final      Susceptibility   Escherichia coli - MIC*    AMPICILLIN <=2 SENSITIVE Sensitive     CEFTAZIDIME <=1 SENSITIVE Sensitive     CEFAZOLIN <=4 SENSITIVE Sensitive     CEFTRIAXONE <=1 SENSITIVE Sensitive     CIPROFLOXACIN <=0.25 SENSITIVE Sensitive     GENTAMICIN <=1 SENSITIVE Sensitive     IMIPENEM <=0.25 SENSITIVE Sensitive     TRIMETH/SULFA <=20 SENSITIVE Sensitive     PIP/TAZO Value in next row Sensitive      SENSITIVE<=4    * ESCHERICHIA COLI  Urine culture     Status: None   Collection Time: 06/06/15  8:25 AM  Result Value Ref Range Status   Specimen Description URINE, RANDOM  Final   Special Requests NONE  Final   Culture MULTIPLE SPECIES PRESENT, SUGGEST RECOLLECTION  Final   Report Status 06/08/2015 FINAL  Final  Culture, blood (routine x 2)     Status: None   Collection Time: 06/06/15  9:57 AM  Result Value Ref Range Status   Specimen Description BLOOD  Final   Special Requests   Final    BOTTLES DRAWN AEROBIC AND ANAEROBIC 1CC AEROBIC,1CC ANAEROBIC   Culture   Setup Time   Final    GRAM NEGATIVE RODS IN BOTH AEROBIC AND ANAEROBIC BOTTLES CRITICAL RESULT CALLED TO, READ BACK BY AND VERIFIED WITH: AMBER THOMAS 06/06/2015 2200 [facility] CONFIRMED BY AJO    Culture   Final    ESCHERICHIA COLI IN BOTH AEROBIC AND ANAEROBIC BOTTLES    Report Status 06/08/2015 FINAL  Final   Organism ID, Bacteria ESCHERICHIA COLI  Final      Susceptibility   Escherichia coli - MIC*    AMPICILLIN <=2 SENSITIVE Sensitive     CEFTAZIDIME <=1 SENSITIVE Sensitive     CEFAZOLIN <=4 SENSITIVE Sensitive     CEFTRIAXONE <=1 SENSITIVE Sensitive     CIPROFLOXACIN <=0.25 SENSITIVE Sensitive     GENTAMICIN <=1 SENSITIVE Sensitive     IMIPENEM <=0.25 SENSITIVE Sensitive     TRIMETH/SULFA <=20 SENSITIVE Sensitive     PIP/TAZO Value in next row Sensitive      SENSITIVE<=4    * ESCHERICHIA COLI    RADIOLOGY:  No results found.    Follow up with PCP in 1 week.  Management plans discussed with the patient, family and they are in agreement.  CODE STATUS:     Code Status Orders        Start     Ordered   06/06/15 1311  Full code   Continuous     06/06/15 1310    Advance Directive Documentation        Most Recent Value   Type of Advance Directive  Living will   Pre-existing out of facility DNR order (yellow form or pink MOST form)     "MOST" Form in Place?        TOTAL TIME TAKING CARE OF THIS PATIENT ON DAY OF DISCHARGE: more than 30 minutes.    Hillary Bow R M.D on 06/08/2015 at 1:30 PM  Between 7am to 6pm - Pager - [phone]  After 6pm go to [email or web address] - password [email or web address] [place] [facility]  Office  [phone]  CC: Primary care physician; Dicky Doe, MD

## 2015-06-09 ENCOUNTER — Inpatient Hospital Stay: Payer: Medicare Other | Admitting: Family Medicine

## 2015-06-09 NOTE — Telephone Encounter (Signed)
Let me see her for her hospital f/u early nest week (Tu or Wed.).-jh

## 2015-06-15 ENCOUNTER — Encounter: Payer: Self-pay | Admitting: Family Medicine

## 2015-06-15 ENCOUNTER — Ambulatory Visit (INDEPENDENT_AMBULATORY_CARE_PROVIDER_SITE_OTHER): Payer: Medicare Other | Admitting: Family Medicine

## 2015-06-15 VITALS — BP 114/75 | HR 71 | Temp 98.0°F | Resp 16 | Ht 65.5 in | Wt 211.0 lb

## 2015-06-15 DIAGNOSIS — K8500 Idiopathic acute pancreatitis without necrosis or infection: Secondary | ICD-10-CM

## 2015-06-15 NOTE — Patient Instructions (Addendum)
Continue to avoid fried/fatty foods for now.  Cont Nexium.  Finish Cipro.  Patient had flu shot in hospital.

## 2015-06-15 NOTE — Progress Notes (Signed)
Name: Yvette [name]   MRN: [id number]    DOB: April 09, 1946   Date:06/15/2015       Progress Note  Subjective  Chief Complaint  Chief Complaint  Patient presents with  . Hospitalization Follow-up    Pt says she feels much better. She is still on Cipro 500 mg she has another weeks work.  . Pancreatitis    HPI  Here for f/u of hospitalization for acute pancreatitis.  Has recovered well.  Has an appt. With Dr. Burt Knack (surgeon) nest week and Dr.  Allen Norris (GI). End of month.  No more pain and no more nausea.  Using Nexium.  Eating light right now.   Has E coli in blood at hospital.  Treated with po Cipro.  Still taking. Marland KitchenNo problem-specific assessment & plan notes found for this encounter.   Past Medical History  Diagnosis Date  . Arthropathy, unspecified, site unspecified   . Esophageal reflux   . Osteoporosis, unspecified   . Other and unspecified hyperlipidemia   . Symptomatic menopausal or female climacteric states   . Other abnormal glucose   . Syncope and collapse   . Unspecified essential hypertension     Social History  Substance Use Topics  . Smoking status: Former Smoker -- 1.00 packs/day for 26 years    Types: Cigarettes    Quit date: 06/07/1992  . Smokeless tobacco: Never Used  . Alcohol Use: No     Comment: occassional     Current outpatient prescriptions:  .  amLODipine (NORVASC) 5 MG tablet, Take 5 mg by mouth daily. , Disp: , Rfl:  .  aspirin 81 MG tablet, Take 81 mg by mouth daily., Disp: , Rfl:  .  beta carotene w/minerals (OCUVITE) tablet, Take 1 tablet by mouth daily., Disp: , Rfl:  .  Calcium Carb-Cholecalciferol (CALTRATE 600+D) 600-800 MG-UNIT TABS, Take 2 tablets by mouth daily., Disp: , Rfl:  .  Cholecalciferol (VITAMIN D3) 50000 UNITS CAPS, Take by mouth daily., Disp: , Rfl:  .  ciprofloxacin (CIPRO) 500 MG tablet, Take 1 tablet (500 mg total) by mouth 2 (two) times daily., Disp: 26 tablet, Rfl: 0 .  Cyanocobalamin (VITAMIN B 12 PO), Take 500 mg by  mouth daily., Disp: , Rfl:  .  EVISTA 60 MG tablet, Take 60 mg by mouth daily. , Disp: , Rfl:  .  hydrochlorothiazide (HYDRODIURIL) 25 MG tablet, Take 25 mg by mouth daily., Disp: , Rfl:  .  Krill Oil 300 MG CAPS, Take by mouth daily., Disp: , Rfl:  .  lisinopril (PRINIVIL,ZESTRIL) 40 MG tablet, Take 40 mg by mouth daily., Disp: , Rfl:  .  Misc Natural Products (OSTEO BI-FLEX JOINT SHIELD PO), Take by mouth as needed. , Disp: , Rfl:  .  Multiple Vitamin (MULTIVITAMIN) tablet, Take 1 tablet by mouth daily., Disp: , Rfl:  .  naproxen (NAPROSYN) 250 MG tablet, Take 250 mg by mouth as needed., Disp: , Rfl:  .  pantoprazole (PROTONIX) 40 MG tablet, Take 40 mg by mouth 2 (two) times daily., Disp: , Rfl:  .  simvastatin (ZOCOR) 10 MG tablet, Take 10 mg by mouth at bedtime. , Disp: , Rfl:   Allergies  Allergen Reactions  . Erythromycin Nausea And Vomiting    Review of Systems  Constitutional: Negative for fever, chills, weight loss and malaise/fatigue.  HENT: Negative for hearing loss.   Eyes: Negative for blurred vision and double vision.  Respiratory: Negative for cough, sputum production, shortness of breath and wheezing.  Cardiovascular: Negative for chest pain, palpitations, orthopnea and leg swelling.  Gastrointestinal: Negative for heartburn, nausea, vomiting, abdominal pain and diarrhea.  Genitourinary: Negative for dysuria, urgency and frequency.  Skin: Negative for rash.  Neurological: Negative for weakness and headaches.      Objective  Filed Vitals:   06/15/15 0903  BP: 114/75  Pulse: 71  Temp: 98 F (36.7 C)  TempSrc: Oral  Resp: 16  Height: 5' 5.5" (1.664 m)  Weight: 211 lb (95.709 kg)     Physical Exam  Constitutional: She is oriented to person, place, and time and well-developed, well-nourished, and in no distress. No distress.  HENT:  Head: Normocephalic and atraumatic.  Neck: Normal range of motion. Neck supple. Carotid bruit is not present. No thyromegaly  present.  Cardiovascular: Normal rate, regular rhythm and intact distal pulses.  Exam reveals no gallop and no friction rub.   No murmur heard. Pulmonary/Chest: Effort normal and breath sounds normal. No respiratory distress. She has no wheezes. She has no rales.  Abdominal: Soft. Bowel sounds are normal. She exhibits no distension and no mass. There is no tenderness. There is no rebound and no guarding.  Musculoskeletal: She exhibits no edema.  Lymphadenopathy:    She has no cervical adenopathy.  Neurological: She is alert and oriented to person, place, and time.  Vitals reviewed.     Recent Results (from the past 2160 hour(s))  Comprehensive metabolic panel     Status: Abnormal   Collection Time: 06/06/15  8:25 AM  Result Value Ref Range   Sodium 140 135 - 145 mmol/L   Potassium 3.9 3.5 - 5.1 mmol/L   Chloride 106 101 - 111 mmol/L   CO2 25 22 - 32 mmol/L   Glucose, Bld 195 (H) 65 - 99 mg/dL   BUN 16 6 - 20 mg/dL   Creatinine, Ser 0.61 0.44 - 1.00 mg/dL   Calcium 8.8 (L) 8.9 - 10.3 mg/dL   Total Protein 6.4 (L) 6.5 - 8.1 g/dL   Albumin 3.9 3.5 - 5.0 g/dL   AST 210 (H) 15 - 41 U/L   ALT 116 (H) 14 - 54 U/L   Alkaline Phosphatase 90 38 - 126 U/L   Total Bilirubin 2.7 (H) 0.3 - 1.2 mg/dL   GFR calc non Af Amer >60 >60 mL/min   GFR calc Af Amer >60 >60 mL/min    Comment: (NOTE) The eGFR has been calculated using the CKD EPI equation. This calculation has not been validated in all clinical situations. eGFR's persistently <60 mL/min signify possible Chronic Kidney Disease.    Anion gap 9 5 - 15  Lactic acid, plasma     Status: Abnormal   Collection Time: 06/06/15  8:25 AM  Result Value Ref Range   Lactic Acid, Venous 3.3 (HH) 0.5 - 2.0 mmol/L    Comment: CRITICAL RESULT CALLED TO, READ BACK BY AND VERIFIED WITH BILL SMITH AT 0913 ON 06/06/15 BY MLZ   CBC with Differential     Status: Abnormal   Collection Time: 06/06/15  8:25 AM  Result Value Ref Range   WBC 4.5 3.6 -  11.0 K/uL   RBC 4.39 3.80 - 5.20 MIL/uL   Hemoglobin 13.1 12.0 - 16.0 g/dL   HCT 39.4 35.0 - 47.0 %   MCV 89.7 80.0 - 100.0 fL   MCH 29.8 26.0 - 34.0 pg   MCHC 33.3 32.0 - 36.0 g/dL   RDW 14.2 11.5 - 14.5 %   Platelets 110 (L)  150 - 440 K/uL   Neutrophils Relative % 93 %   Neutro Abs 4.2 1.4 - 6.5 K/uL   Lymphocytes Relative 4 %   Lymphs Abs 0.2 (L) 1.0 - 3.6 K/uL   Monocytes Relative 2 %   Monocytes Absolute 0.1 (L) 0.2 - 0.9 K/uL   Eosinophils Relative 1 %   Eosinophils Absolute 0.0 0 - 0.7 K/uL   Basophils Relative 0 %   Basophils Absolute 0.0 0 - 0.1 K/uL  Culture, blood (routine x 2)     Status: None   Collection Time: 06/06/15  8:25 AM  Result Value Ref Range   Specimen Description BLOOD LEFT    Special Requests      BOTTLES DRAWN AEROBIC AND ANAEROBIC  5CC AERO 6 CC ANAERO   Culture  Setup Time      GRAM NEGATIVE RODS IN BOTH AEROBIC AND ANAEROBIC BOTTLES CRITICAL RESULT CALLED TO, READ BACK BY AND VERIFIED WITH: AMBER THOMAS ON 06/06/15 BY TB. CONFIRMED BY TB/ASO.    Culture      ESCHERICHIA COLI IN BOTH AEROBIC AND ANAEROBIC BOTTLES    Report Status 06/08/2015 FINAL    Organism ID, Bacteria ESCHERICHIA COLI       Susceptibility   Escherichia coli - MIC*    AMPICILLIN <=2 SENSITIVE Sensitive     CEFTAZIDIME <=1 SENSITIVE Sensitive     CEFAZOLIN <=4 SENSITIVE Sensitive     CEFTRIAXONE <=1 SENSITIVE Sensitive     CIPROFLOXACIN <=0.25 SENSITIVE Sensitive     GENTAMICIN <=1 SENSITIVE Sensitive     IMIPENEM <=0.25 SENSITIVE Sensitive     TRIMETH/SULFA <=20 SENSITIVE Sensitive     PIP/TAZO Value in next row Sensitive      SENSITIVE<=4    * ESCHERICHIA COLI  Lipase, blood     Status: Abnormal   Collection Time: 06/06/15  8:25 AM  Result Value Ref Range   Lipase 140 (H) 22 - 51 U/L  Urinalysis complete, with microscopic     Status: Abnormal   Collection Time: 06/06/15  8:25 AM  Result Value Ref Range   Color, Urine AMBER (A) YELLOW   APPearance CLEAR (A) CLEAR    Glucose, UA NEGATIVE NEGATIVE mg/dL   Bilirubin Urine NEGATIVE NEGATIVE   Ketones, ur NEGATIVE NEGATIVE mg/dL   Specific Gravity, Urine 1.017 1.005 - 1.030   Hgb urine dipstick NEGATIVE NEGATIVE   pH 7.0 5.0 - 8.0   Protein, ur NEGATIVE NEGATIVE mg/dL   Nitrite NEGATIVE NEGATIVE   Leukocytes, UA NEGATIVE NEGATIVE   RBC / HPF 0-5 0 - 5 RBC/hpf   WBC, UA 0-5 0 - 5 WBC/hpf   Bacteria, UA NONE SEEN NONE SEEN   Squamous Epithelial / LPF 0-5 (A) NONE SEEN   Mucous PRESENT   Urine culture     Status: None   Collection Time: 06/06/15  8:25 AM  Result Value Ref Range   Specimen Description URINE, RANDOM    Special Requests NONE    Culture MULTIPLE SPECIES PRESENT, SUGGEST RECOLLECTION    Report Status 06/08/2015 FINAL   Lipid panel     Status: Abnormal   Collection Time: 06/06/15  8:25 AM  Result Value Ref Range   Cholesterol 192 0 - 200 mg/dL   Triglycerides 952 (H) <150 mg/dL   HDL 56 >19 mg/dL   Total CHOL/HDL Ratio 3.4 RATIO   VLDL 31 0 - 40 mg/dL   LDL Cholesterol 466 (H) 0 - 99 mg/dL    Comment:  Total Cholesterol/HDL:CHD Risk Coronary Heart Disease Risk Table                     Men   Women  1/2 Average Risk   3.4   3.3  Average Risk       5.0   4.4  2 X Average Risk   9.6   7.1  3 X Average Risk  23.4   11.0        Use the calculated Patient Ratio above and the CHD Risk Table to determine the patient's CHD Risk.        ATP III CLASSIFICATION (LDL):  <100     mg/dL   Optimal  100-129  mg/dL   Near or Above                    Optimal  130-159  mg/dL   Borderline  160-189  mg/dL   High  >190     mg/dL   Very High   Culture, blood (routine x 2)     Status: None   Collection Time: 06/06/15  9:57 AM  Result Value Ref Range   Specimen Description BLOOD    Special Requests      BOTTLES DRAWN AEROBIC AND ANAEROBIC 1CC AEROBIC,1CC ANAEROBIC   Culture  Setup Time      GRAM NEGATIVE RODS IN BOTH AEROBIC AND ANAEROBIC BOTTLES CRITICAL RESULT CALLED TO, READ BACK  BY AND VERIFIED WITH: AMBER THOMAS 06/06/2015 [address] BY AJO    Culture      ESCHERICHIA COLI IN BOTH AEROBIC AND ANAEROBIC BOTTLES    Report Status 06/08/2015 FINAL    Organism ID, Bacteria ESCHERICHIA COLI       Susceptibility   Escherichia coli - MIC*    AMPICILLIN <=2 SENSITIVE Sensitive     CEFTAZIDIME <=1 SENSITIVE Sensitive     CEFAZOLIN <=4 SENSITIVE Sensitive     CEFTRIAXONE <=1 SENSITIVE Sensitive     CIPROFLOXACIN <=0.25 SENSITIVE Sensitive     GENTAMICIN <=1 SENSITIVE Sensitive     IMIPENEM <=0.25 SENSITIVE Sensitive     TRIMETH/SULFA <=20 SENSITIVE Sensitive     PIP/TAZO Value in next row Sensitive      SENSITIVE<=4    * ESCHERICHIA COLI  Lactic acid, plasma     Status: Abnormal   Collection Time: 06/06/15 11:31 AM  Result Value Ref Range   Lactic Acid, Venous 2.6 (HH) 0.5 - 2.0 mmol/L    Comment: CRITICAL RESULT CALLED TO, READ BACK BY AND VERIFIED WITH SHANNON MARTIN AT 1214 ON 06/06/15 BY MLZ   Basic metabolic panel     Status: Abnormal   Collection Time: 06/07/15  4:53 AM  Result Value Ref Range   Sodium 139 135 - 145 mmol/L   Potassium 2.9 (LL) 3.5 - 5.1 mmol/L    Comment: CRITICAL RESULT CALLED TO, READ BACK BY AND VERIFIED WITH QUANISHA GRAVES 06/07/2015 0536 [facility]   Chloride 108 101 - 111 mmol/L   CO2 26 22 - 32 mmol/L   Glucose, Bld 118 (H) 65 - 99 mg/dL   BUN 12 6 - 20 mg/dL   Creatinine, Ser 0.61 0.44 - 1.00 mg/dL   Calcium 7.6 (L) 8.9 - 10.3 mg/dL   GFR calc non Af Amer >60 >60 mL/min   GFR calc Af Amer >60 >60 mL/min    Comment: (NOTE) The eGFR has been calculated using the CKD EPI equation. This calculation has not been validated  in all clinical situations. eGFR's persistently <60 mL/min signify possible Chronic Kidney Disease.    Anion gap 5 5 - 15  CBC     Status: Abnormal   Collection Time: 06/07/15  4:53 AM  Result Value Ref Range   WBC 4.9 3.6 - 11.0 K/uL   RBC 3.57 (L) 3.80 - 5.20 MIL/uL   Hemoglobin 10.8 (L) 12.0 - 16.0  g/dL   HCT 31.9 (L) 35.0 - 47.0 %   MCV 89.2 80.0 - 100.0 fL   MCH 30.1 26.0 - 34.0 pg   MCHC 33.8 32.0 - 36.0 g/dL   RDW 14.1 11.5 - 14.5 %   Platelets 87 (L) 150 - 440 K/uL  Hepatic function panel     Status: Abnormal   Collection Time: 06/07/15  4:53 AM  Result Value Ref Range   Total Protein 5.3 (L) 6.5 - 8.1 g/dL   Albumin 3.0 (L) 3.5 - 5.0 g/dL   AST 84 (H) 15 - 41 U/L   ALT 90 (H) 14 - 54 U/L   Alkaline Phosphatase 74 38 - 126 U/L   Total Bilirubin 1.5 (H) 0.3 - 1.2 mg/dL   Bilirubin, Direct 0.6 (H) 0.1 - 0.5 mg/dL   Indirect Bilirubin 0.9 0.3 - 0.9 mg/dL  Comprehensive metabolic panel     Status: Abnormal   Collection Time: 06/08/15  3:57 AM  Result Value Ref Range   Sodium 141 135 - 145 mmol/L   Potassium 4.1 3.5 - 5.1 mmol/L   Chloride 112 (H) 101 - 111 mmol/L   CO2 24 22 - 32 mmol/L   Glucose, Bld 122 (H) 65 - 99 mg/dL   BUN 11 6 - 20 mg/dL   Creatinine, Ser 0.53 0.44 - 1.00 mg/dL   Calcium 8.1 (L) 8.9 - 10.3 mg/dL   Total Protein 5.2 (L) 6.5 - 8.1 g/dL   Albumin 2.9 (L) 3.5 - 5.0 g/dL   AST 48 (H) 15 - 41 U/L   ALT 65 (H) 14 - 54 U/L   Alkaline Phosphatase 79 38 - 126 U/L   Total Bilirubin 0.7 0.3 - 1.2 mg/dL   GFR calc non Af Amer >60 >60 mL/min   GFR calc Af Amer >60 >60 mL/min    Comment: (NOTE) The eGFR has been calculated using the CKD EPI equation. This calculation has not been validated in all clinical situations. eGFR's persistently <60 mL/min signify possible Chronic Kidney Disease.    Anion gap 5 5 - 15  CBC with Differential/Platelet     Status: Abnormal   Collection Time: 06/08/15  3:57 AM  Result Value Ref Range   WBC 3.2 (L) 3.6 - 11.0 K/uL   RBC 3.57 (L) 3.80 - 5.20 MIL/uL   Hemoglobin 10.8 (L) 12.0 - 16.0 g/dL   HCT 32.0 (L) 35.0 - 47.0 %   MCV 89.6 80.0 - 100.0 fL   MCH 30.3 26.0 - 34.0 pg   MCHC 33.8 32.0 - 36.0 g/dL   RDW 14.5 11.5 - 14.5 %   Platelets 90 (L) 150 - 440 K/uL   Neutrophils Relative % 65% %   Neutro Abs 2.1 1.4 -  6.5 K/uL   Lymphocytes Relative 20% %   Lymphs Abs 0.6 (L) 1.0 - 3.6 K/uL   Monocytes Relative 11% %   Monocytes Absolute 0.4 0.2 - 0.9 K/uL   Eosinophils Relative 3% %   Eosinophils Absolute 0.1 0 - 0.7 K/uL   Basophils Relative 1% %   Basophils Absolute  0.0 0 - 0.1 K/uL     Assessment & Plan  1. Idiopathic acute pancreatitis  - Ambulatory referral to General Surgery - Ambulatory referral to Gastroenterology

## 2015-06-19 ENCOUNTER — Ambulatory Visit: Payer: Self-pay | Admitting: Family Medicine

## 2015-06-21 ENCOUNTER — Other Ambulatory Visit
Admission: RE | Admit: 2015-06-21 | Discharge: 2015-06-21 | Disposition: A | Discharge status: Home / Self Care | Payer: Medicare Other | Source: Ambulatory Visit | Attending: Surgery | Admitting: Surgery

## 2015-06-21 ENCOUNTER — Encounter: Payer: Self-pay | Admitting: Surgery

## 2015-06-21 ENCOUNTER — Ambulatory Visit (INDEPENDENT_AMBULATORY_CARE_PROVIDER_SITE_OTHER): Payer: Medicare Other | Admitting: Surgery

## 2015-06-21 VITALS — BP 129/65 | HR 64 | Temp 97.7°F | Ht 66.0 in | Wt 211.0 lb

## 2015-06-21 DIAGNOSIS — K8500 Idiopathic acute pancreatitis without necrosis or infection: Secondary | ICD-10-CM

## 2015-06-21 LAB — CBC WITH DIFFERENTIAL/PLATELET
Basophils Absolute: 0 10*3/uL (ref 0–0.1)
Basophils Relative: 1 %
Eosinophils Absolute: 0.1 10*3/uL (ref 0–0.7)
Eosinophils Relative: 2 %
HCT: 38.4 % (ref 35.0–47.0)
Hemoglobin: 13 g/dL (ref 12.0–16.0)
Lymphocytes Relative: 26 %
Lymphs Abs: 1.3 10*3/uL (ref 1.0–3.6)
MCH: 29.9 pg (ref 26.0–34.0)
MCHC: 33.8 g/dL (ref 32.0–36.0)
MCV: 88.4 fL (ref 80.0–100.0)
Monocytes Absolute: 0.5 10*3/uL (ref 0.2–0.9)
Monocytes Relative: 10 %
Neutro Abs: 2.9 10*3/uL (ref 1.4–6.5)
Neutrophils Relative %: 61 %
Platelets: 177 10*3/uL (ref 150–440)
RBC: 4.35 MIL/uL (ref 3.80–5.20)
RDW: 14.2 % (ref 11.5–14.5)
WBC: 4.8 10*3/uL (ref 3.6–11.0)

## 2015-06-21 LAB — COMPREHENSIVE METABOLIC PANEL
ALT: 18 U/L (ref 14–54)
AST: 20 U/L (ref 15–41)
Albumin: 4.4 g/dL (ref 3.5–5.0)
Alkaline Phosphatase: 74 U/L (ref 38–126)
Anion gap: 10 (ref 5–15)
BUN: 21 mg/dL — ABNORMAL HIGH (ref 6–20)
CO2: 28 mmol/L (ref 22–32)
Calcium: 9.2 mg/dL (ref 8.9–10.3)
Chloride: 99 mmol/L — ABNORMAL LOW (ref 101–111)
Creatinine, Ser: 0.58 mg/dL (ref 0.44–1.00)
GFR calc Af Amer: >60 mL/min (ref >60)
GFR calc non Af Amer: >60 mL/min (ref >60)
Glucose, Bld: 130 mg/dL — ABNORMAL HIGH (ref 65–99)
Potassium: 3.9 mmol/L (ref 3.5–5.1)
Sodium: 137 mmol/L (ref 135–145)
Total Bilirubin: 0.9 mg/dL (ref 0.3–1.2)
Total Protein: 7.2 g/dL (ref 6.5–8.1)

## 2015-06-21 LAB — LIPASE, BLOOD: Lipase: 45 U/L (ref 22–51)

## 2015-06-21 NOTE — Progress Notes (Signed)
Surgical Consultation  06/21/2015  Yvette [name] is an 69 y.o. female.   CC: Resolved abdominal pain  HPI: This patient with a history of unclear pancreatitis and cholangitis highly suggestive of gallstone pancreatitis cholangitis however her workup in the hospital failed to identify gallstones. Currently her abdominal pain is completely gone she only had one episode of this she has no fevers chills no nausea vomiting no abdominal pain whatsoever no back pain and denies jaundice or acholic stools.  Of note her daughter had her gallbladder removed but it was not for significant reasons in fact the pathology was negative according to the patient.  Past Medical History  Diagnosis Date  . Arthropathy, unspecified, site unspecified   . Esophageal reflux   . Osteoporosis, unspecified   . Other and unspecified hyperlipidemia   . Symptomatic menopausal or female climacteric states   . Other abnormal glucose   . Syncope and collapse   . Unspecified essential hypertension     Past Surgical History  Procedure Laterality Date  . Appendectomy    . Finger fracture surgery    . Colonoscopy    . Breast biopsy Left     Family History  Problem Relation Age of Onset  . Diabetes Mother   . Heart disease Mother   . Heart attack Mother   . Ulcers Father   . Gallstones Father   . Heart attack Brother     Social History:  reports that she quit smoking about 23 years ago. Her smoking use included Cigarettes. She has a 26 pack-year smoking history. She has never used smokeless tobacco. She reports that she drinks alcohol. She reports that she does not use illicit drugs.  Allergies:  Allergies  Allergen Reactions  . Erythromycin Nausea And Vomiting    Medications reviewed.   Review of Systems:   Review of Systems  Constitutional: Negative.   HENT: Negative.   Eyes: Negative.   Respiratory: Negative.   Cardiovascular: Negative.   Gastrointestinal: Negative.   Genitourinary:  Negative.   Musculoskeletal: Negative.   Skin: Negative.   Neurological: Negative.   Endo/Heme/Allergies: Negative.   Psychiatric/Behavioral: Negative.      Physical Exam:  BP 129/65 mmHg  Pulse 64  Temp(Src) 97.7 F (36.5 C) (Oral)  Ht 5\' 6"  (1.676 m)  Wt 211 lb (95.709 kg)  BMI 34.07 kg/m2  Physical Exam  Constitutional: She is oriented to person, place, and time and well-developed, well-nourished, and in no distress. No distress.  HENT:  Head: Normocephalic and atraumatic.  Eyes: No scleral icterus.  Neck: Normal range of motion. Neck supple.  Cardiovascular: Normal rate and regular rhythm.   Pulmonary/Chest: Effort normal. No respiratory distress. She has no wheezes. She has no rales.  Abdominal: Soft. She exhibits no distension. There is no tenderness. There is no rebound and no guarding.  Musculoskeletal: Normal range of motion. She exhibits no edema.  Lymphadenopathy:    She has no cervical adenopathy.  Neurological: She is alert and oriented to person, place, and time.  Skin: Skin is warm and dry.  Psychiatric: Mood, affect and judgment normal.      No results found for this or any previous visit (from the past 48 hour(s)). No results found.  Assessment/Plan:  Labs in the hospital chart reviewed. I see no sign of this being gallstone pancreatitis however the history is significant and suggestive of this being gallstone pancreatitis and cholangitis. It has resolved at this point. As a point with Dr. Allen Norris this  month and she will follow-up with me if surgery is indicated in this patient over this point I have no evidence to suggest that this is fact truly related to the gallbladder and therefore I cannot offer surgery at this point.  Florene Glen, MD, FACS

## 2015-06-21 NOTE — Patient Instructions (Addendum)
Please stop by the Lab on the first floor to have your labs drawn. We will call with results.  Continue with your scheduled appointment with Dr. Allen Norris.  Christophe Louis, MD is hand surgeon in immediate area, may need to look further into [facility] for additional surgeons for your hand.

## 2015-07-05 DIAGNOSIS — L821 Other seborrheic keratosis: Secondary | ICD-10-CM | POA: Diagnosis not present

## 2015-07-05 DIAGNOSIS — L218 Other seborrheic dermatitis: Secondary | ICD-10-CM | POA: Diagnosis not present

## 2015-07-05 DIAGNOSIS — Z1283 Encounter for screening for malignant neoplasm of skin: Secondary | ICD-10-CM | POA: Diagnosis not present

## 2015-07-06 ENCOUNTER — Ambulatory Visit (INDEPENDENT_AMBULATORY_CARE_PROVIDER_SITE_OTHER): Payer: Medicare Other | Admitting: Gastroenterology

## 2015-07-06 ENCOUNTER — Encounter: Payer: Self-pay | Admitting: Gastroenterology

## 2015-07-06 VITALS — BP 131/61 | HR 58 | Temp 98.3°F | Ht 65.0 in | Wt 215.0 lb

## 2015-07-06 DIAGNOSIS — R1013 Epigastric pain: Secondary | ICD-10-CM

## 2015-07-06 NOTE — Progress Notes (Signed)
Gastroenterology Consultation  Referring Provider:     Arlis Porta., MD Primary Care Physician:  Dicky Doe, MD Primary Gastroenterologist:  Dr. Allen Norris     Reason for Consultation:     Abdominal pain        HPI:   Yvette [name] is a 69 y.o. y/o female referred for consultation & management of  Abdominal pain by Dr. Dicky Doe, MD.   This patient comes in today after having an episode of abdominal pain. The patient states that when she had abdominal pain in the epigastric area her liver enzymes were noted to be high. After the pain subsided the liver enzymes came back to normal and she has had no further symptoms since then. She denies any black stools or bloody stools. She also denies any dysphagia or nausea vomiting. The patient states that she is back to her normal state of health. There is no report of any reflux. She also denies any unexplained weight loss.  Past Medical History  Diagnosis Date  . Arthropathy, unspecified, site unspecified   . Esophageal reflux   . Osteoporosis, unspecified   . Other and unspecified hyperlipidemia   . Symptomatic menopausal or female climacteric states   . Other abnormal glucose   . Syncope and collapse   . Unspecified essential hypertension     Past Surgical History  Procedure Laterality Date  . Appendectomy    . Finger fracture surgery    . Colonoscopy    . Breast biopsy Left     Prior to Admission medications   Medication Sig Start Date End Date Taking? Authorizing Provider  amLODipine (NORVASC) 5 MG tablet Take 5 mg by mouth daily.  06/16/13  Yes Historical Provider, MD  aspirin 81 MG tablet Take 81 mg by mouth daily.   Yes Historical Provider, MD  beta carotene w/minerals (OCUVITE) tablet Take 1 tablet by mouth daily.   Yes Historical Provider, MD  Cyanocobalamin (VITAMIN B 12 PO) Take 500 mg by mouth daily.   Yes Historical Provider, MD  EVISTA 60 MG tablet Take 60 mg by mouth daily.  06/16/13  Yes Historical  Provider, MD  hydrochlorothiazide (HYDRODIURIL) 25 MG tablet Take 25 mg by mouth daily.   Yes Historical Provider, MD  Javier Docker Oil 300 MG CAPS Take by mouth daily.   Yes Historical Provider, MD  lisinopril (PRINIVIL,ZESTRIL) 40 MG tablet Take 40 mg by mouth daily.   Yes Historical Provider, MD  Multiple Vitamin (MULTIVITAMIN) tablet Take 1 tablet by mouth daily.   Yes Historical Provider, MD  naproxen (NAPROSYN) 250 MG tablet Take 250 mg by mouth as needed.   Yes Historical Provider, MD  pantoprazole (PROTONIX) 40 MG tablet Take 40 mg by mouth 2 (two) times daily.   Yes Historical Provider, MD  simvastatin (ZOCOR) 10 MG tablet Take 10 mg by mouth at bedtime.    Yes Arlis Porta., MD  Calcium Carb-Cholecalciferol (CALTRATE 600+D) 600-800 MG-UNIT TABS Take 2 tablets by mouth daily.    Historical Provider, MD  ciprofloxacin (CIPRO) 500 MG tablet Take 1 tablet (500 mg total) by mouth 2 (two) times daily. Patient not taking: Reported on 07/06/2015 06/08/15   Hillary Bow, MD    Family History  Problem Relation Age of Onset  . Diabetes Mother   . Heart disease Mother   . Heart attack Mother   . Ulcers Father   . Gallstones Father   . Heart attack Brother  Social History  Substance Use Topics  . Smoking status: Former Smoker -- 1.00 packs/day for 26 years    Types: Cigarettes    Quit date: 06/07/1992  . Smokeless tobacco: Never Used  . Alcohol Use: 0.0 oz/week    0 Standard drinks or equivalent per week     Comment: occassional    Allergies as of 07/06/2015 - Review Complete 07/06/2015  Allergen Reaction Noted  . Erythromycin Nausea And Vomiting 06/22/2013    Review of Systems:    All systems reviewed and negative except where noted in HPI.   Physical Exam:  BP 131/61 mmHg  Pulse 58  Temp(Src) 98.3 F (36.8 C) (Oral)  Ht 5\' 5"  (1.651 m)  Wt 215 lb (97.523 kg)  BMI 35.78 kg/m2 No LMP recorded. Patient is postmenopausal. Psych:  Alert and cooperative. Normal mood and  affect. General:   Alert,  Well-developed, well-nourished, pleasant and cooperative in NAD Head:  Normocephalic and atraumatic. Eyes:  Sclera clear, no icterus.   Conjunctiva pink. Ears:  Normal auditory acuity. Nose:  No deformity, discharge, or lesions. Mouth:  No deformity or lesions,oropharynx pink & moist. Neck:  Supple; no masses or thyromegaly. Lungs:  Respirations even and unlabored.  Clear throughout to auscultation.   No wheezes, crackles, or rhonchi. No acute distress. Heart:  Regular rate and rhythm; no murmurs, clicks, rubs, or gallops. Abdomen:  Normal bowel sounds.  No bruits.  Soft, non-tender and non-distended without masses, hepatosplenomegaly or hernias noted.  No guarding or rebound tenderness.  Negative Carnett sign.   Rectal:  Deferred.  Msk:  Symmetrical without gross deformities.  Good, equal movement & strength bilaterally. Pulses:  Normal pulses noted. Extremities:  No clubbing or edema.  No cyanosis. Neurologic:  Alert and oriented x3;  grossly normal neurologically. Skin:  Intact without significant lesions or rashes.  No jaundice. Lymph Nodes:  No significant cervical adenopathy. Psych:  Alert and cooperative. Normal mood and affect.  Imaging Studies: No results found.  Assessment and Plan:   Yvette [name] is a 69 y.o. y/o female  Who had an episode of abdominal pain with abnormal liver enzymes that all resolved spontaneously. The patient has been feeling well and has had no residual symptoms since the pain resolved. The patient will contact me if her symptoms return. There is no reason to perform any further testing on this patient who has no residual symptoms. The patient has been explained the plan and agrees with it.   Note: This dictation was prepared with Dragon dictation along with smaller phrase technology. Any transcriptional errors that result from this process are unintentional.

## 2015-09-12 ENCOUNTER — Ambulatory Visit (INDEPENDENT_AMBULATORY_CARE_PROVIDER_SITE_OTHER): Payer: Medicare Other | Admitting: Family Medicine

## 2015-09-12 ENCOUNTER — Encounter: Payer: Self-pay | Admitting: Family Medicine

## 2015-09-12 VITALS — BP 135/79 | HR 79 | Temp 97.7°F | Resp 16 | Ht 66.0 in | Wt 215.0 lb

## 2015-09-12 DIAGNOSIS — R739 Hyperglycemia, unspecified: Secondary | ICD-10-CM

## 2015-09-12 DIAGNOSIS — I10 Essential (primary) hypertension: Secondary | ICD-10-CM | POA: Diagnosis not present

## 2015-09-12 DIAGNOSIS — E1169 Type 2 diabetes mellitus with other specified complication: Secondary | ICD-10-CM | POA: Insufficient documentation

## 2015-09-12 DIAGNOSIS — R7303 Prediabetes: Secondary | ICD-10-CM

## 2015-09-12 DIAGNOSIS — K851 Biliary acute pancreatitis without necrosis or infection: Secondary | ICD-10-CM | POA: Diagnosis not present

## 2015-09-12 LAB — POCT GLYCOSYLATED HEMOGLOBIN (HGB A1C): Hemoglobin A1C: 5.5

## 2015-09-12 NOTE — Progress Notes (Signed)
Name: Yvette [name]   MRN: [id number]    DOB: 07-29-1946   Date:09/12/2015       Progress Note  Subjective  Chief Complaint  Chief Complaint  Patient presents with  . Pancreatitis    f/u pt doing well.    HPI Here for f/u of pancreatitis.  GI and surgeon have pretty much released her.  No f/u unless she has mor abdominal; pain.  Her BSs have been elevated when checked on CMPs.  No problem-specific assessment & plan notes found for this encounter.   Past Medical History  Diagnosis Date  . Arthropathy, unspecified, site unspecified   . Esophageal reflux   . Osteoporosis, unspecified   . Other and unspecified hyperlipidemia   . Symptomatic menopausal or female climacteric states   . Other abnormal glucose   . Syncope and collapse   . Unspecified essential hypertension     Past Surgical History  Procedure Laterality Date  . Appendectomy    . Finger fracture surgery    . Colonoscopy    . Breast biopsy Left     Family History  Problem Relation Age of Onset  . Diabetes Mother   . Heart disease Mother   . Heart attack Mother   . Ulcers Father   . Gallstones Father   . Heart attack Brother     Social History   Social History  . Marital Status: Married    Spouse Name: N/A  . Number of Children: N/A  . Years of Education: N/A   Occupational History  . Not on file.   Social History Main Topics  . Smoking status: Former Smoker -- 1.00 packs/day for 26 years    Types: Cigarettes    Quit date: 06/07/1992  . Smokeless tobacco: Never Used  . Alcohol Use: 0.0 oz/week    0 Standard drinks or equivalent per week     Comment: occassional  . Drug Use: No  . Sexual Activity: Not on file   Other Topics Concern  . Not on file   Social History Narrative     Current outpatient prescriptions:  .  amLODipine (NORVASC) 5 MG tablet, Take 5 mg by mouth daily. , Disp: , Rfl:  .  aspirin 81 MG tablet, Take 81 mg by mouth daily., Disp: , Rfl:  .  beta carotene w/minerals  (OCUVITE) tablet, Take 1 tablet by mouth daily., Disp: , Rfl:  .  Calcium Carb-Cholecalciferol (CALTRATE 600+D) 600-800 MG-UNIT TABS, Take 2 tablets by mouth daily., Disp: , Rfl:  .  Cyanocobalamin (VITAMIN B 12 PO), Take 500 mg by mouth daily., Disp: , Rfl:  .  EVISTA 60 MG tablet, Take 60 mg by mouth daily. , Disp: , Rfl:  .  hydrochlorothiazide (HYDRODIURIL) 25 MG tablet, Take 25 mg by mouth daily., Disp: , Rfl:  .  Krill Oil 300 MG CAPS, Take by mouth daily., Disp: , Rfl:  .  lisinopril (PRINIVIL,ZESTRIL) 40 MG tablet, Take 40 mg by mouth daily., Disp: , Rfl:  .  Multiple Vitamin (MULTIVITAMIN) tablet, Take 1 tablet by mouth daily., Disp: , Rfl:  .  naproxen (NAPROSYN) 250 MG tablet, Take 250 mg by mouth as needed., Disp: , Rfl:  .  pantoprazole (PROTONIX) 40 MG tablet, Take 40 mg by mouth 2 (two) times daily., Disp: , Rfl:  .  simvastatin (ZOCOR) 10 MG tablet, Take 10 mg by mouth at bedtime. , Disp: , Rfl:   Allergies  Allergen Reactions  . Erythromycin Nausea And  Vomiting     Review of Systems  Constitutional: Negative for fever, chills, weight loss and malaise/fatigue.  HENT: Negative for hearing loss.   Eyes: Negative for blurred vision and double vision.  Respiratory: Negative for cough, shortness of breath and wheezing.   Cardiovascular: Negative for chest pain, palpitations and leg swelling.  Gastrointestinal: Negative for heartburn, nausea, vomiting, abdominal pain, diarrhea, constipation and blood in stool.  Genitourinary: Negative for dysuria, urgency and frequency.  Musculoskeletal: Positive for joint pain (R knee and R hip).  Skin: Negative for rash.  Neurological: Negative for dizziness, tremors, weakness and headaches.      Objective  Filed Vitals:   09/12/15 1301  BP: 135/79  Pulse: 79  Temp: 97.7 F (36.5 C)  TempSrc: Oral  Resp: 16  Height: _0  (1.676 m)  Weight: 215 lb (97.523 kg)    Physical Exam  Constitutional: She is oriented to person,  place, and time and well-developed, well-nourished, and in no distress. No distress.  HENT:  Head: Normocephalic and atraumatic.  Eyes: Conjunctivae and EOM are normal. Pupils are equal, round, and reactive to light. No scleral icterus.  Neck: Normal range of motion. Neck supple. Carotid bruit is not present. No thyromegaly present.  Cardiovascular: Normal rate, regular rhythm, normal heart sounds and intact distal pulses.  Exam reveals no gallop and no friction rub.   No murmur heard. Pulmonary/Chest: Effort normal and breath sounds normal. No respiratory distress. She has no wheezes. She has no rales.  Abdominal: She exhibits no distension, no abdominal bruit and no mass. There is no tenderness.  Musculoskeletal: She exhibits no edema.  Lymphadenopathy:    She has no cervical adenopathy.  Neurological: She is alert and oriented to person, place, and time.  Vitals reviewed.      Recent Results (from the past 2160 hour(s))  CBC with Differential/Platelet     Status: None   Collection Time: 06/21/15 10:24 AM  Result Value Ref Range   WBC 4.8 3.6 - 11.0 K/uL   RBC 4.35 3.80 - 5.20 MIL/uL   Hemoglobin 13.0 12.0 - 16.0 g/dL   HCT 38.4 35.0 - 47.0 %   MCV 88.4 80.0 - 100.0 fL   MCH 29.9 26.0 - 34.0 pg   MCHC 33.8 32.0 - 36.0 g/dL   RDW 14.2 11.5 - 14.5 %   Platelets 177 150 - 440 K/uL   Neutrophils Relative % 61 %   Neutro Abs 2.9 1.4 - 6.5 K/uL   Lymphocytes Relative 26 %   Lymphs Abs 1.3 1.0 - 3.6 K/uL   Monocytes Relative 10 %   Monocytes Absolute 0.5 0.2 - 0.9 K/uL   Eosinophils Relative 2 %   Eosinophils Absolute 0.1 0 - 0.7 K/uL   Basophils Relative 1 %   Basophils Absolute 0.0 0 - 0.1 K/uL  Comprehensive metabolic panel     Status: Abnormal   Collection Time: 06/21/15 10:24 AM  Result Value Ref Range   Sodium 137 135 - 145 mmol/L   Potassium 3.9 3.5 - 5.1 mmol/L   Chloride 99 (L) 101 - 111 mmol/L   CO2 28 22 - 32 mmol/L   Glucose, Bld 130 (H) 65 - 99 mg/dL   BUN 21  (H) 6 - 20 mg/dL   Creatinine, Ser 0.58 0.44 - 1.00 mg/dL   Calcium 9.2 8.9 - 10.3 mg/dL   Total Protein 7.2 6.5 - 8.1 g/dL   Albumin 4.4 3.5 - 5.0 g/dL   AST 20 15 -  41 U/L   ALT 18 14 - 54 U/L   Alkaline Phosphatase 74 38 - 126 U/L   Total Bilirubin 0.9 0.3 - 1.2 mg/dL   GFR calc non Af Amer >60 >60 mL/min   GFR calc Af Amer >60 >60 mL/min    Comment: (NOTE) The eGFR has been calculated using the CKD EPI equation. This calculation has not been validated in all clinical situations. eGFR's persistently <60 mL/min signify possible Chronic Kidney Disease.    Anion gap 10 5 - 15  Lipase, blood     Status: None   Collection Time: 06/21/15 10:24 AM  Result Value Ref Range   Lipase 45 22 - 51 U/L     Assessment & Plan  Problem List Items Addressed This Visit      Cardiovascular and Mediastinum   Essential hypertension     Other   Hyperglycemia   Relevant Orders   POCT HgB A1C   RESOLVED: Acute pancreatitis - Primary      No orders of the defined types were placed in this encounter.   1. Acute biliary pancreatitis without infection or necrosis -resolved.  2. Essential hypertension -continue current meds.  3. Hyperglycemia  - POCT HgB A1C- 5.5

## 2015-09-12 NOTE — Patient Instructions (Signed)
Discussed calorie reduction and weight loss.

## 2016-01-15 ENCOUNTER — Ambulatory Visit: Payer: Medicare Other | Admitting: Family Medicine

## 2016-08-23 ENCOUNTER — Other Ambulatory Visit: Payer: Self-pay | Admitting: Family Medicine

## 2016-09-03 ENCOUNTER — Ambulatory Visit (INDEPENDENT_AMBULATORY_CARE_PROVIDER_SITE_OTHER): Payer: Medicare Other | Admitting: Family Medicine

## 2016-09-03 ENCOUNTER — Other Ambulatory Visit: Payer: Self-pay | Admitting: Family Medicine

## 2016-09-03 ENCOUNTER — Encounter: Payer: Self-pay | Admitting: Family Medicine

## 2016-09-03 VITALS — BP 175/80 | HR 67 | Temp 97.7°F | Resp 16 | Ht 66.0 in | Wt 225.0 lb

## 2016-09-03 DIAGNOSIS — Z0000 Encounter for general adult medical examination without abnormal findings: Secondary | ICD-10-CM | POA: Diagnosis not present

## 2016-09-03 DIAGNOSIS — Z1231 Encounter for screening mammogram for malignant neoplasm of breast: Secondary | ICD-10-CM | POA: Diagnosis not present

## 2016-09-03 DIAGNOSIS — Z1239 Encounter for other screening for malignant neoplasm of breast: Secondary | ICD-10-CM

## 2016-09-03 DIAGNOSIS — I209 Angina pectoris, unspecified: Secondary | ICD-10-CM | POA: Diagnosis not present

## 2016-09-03 DIAGNOSIS — E784 Other hyperlipidemia: Secondary | ICD-10-CM

## 2016-09-03 DIAGNOSIS — K219 Gastro-esophageal reflux disease without esophagitis: Secondary | ICD-10-CM | POA: Diagnosis not present

## 2016-09-03 DIAGNOSIS — E7849 Other hyperlipidemia: Secondary | ICD-10-CM

## 2016-09-03 DIAGNOSIS — F419 Anxiety disorder, unspecified: Secondary | ICD-10-CM

## 2016-09-03 DIAGNOSIS — I10 Essential (primary) hypertension: Secondary | ICD-10-CM | POA: Diagnosis not present

## 2016-09-03 DIAGNOSIS — E785 Hyperlipidemia, unspecified: Secondary | ICD-10-CM

## 2016-09-03 DIAGNOSIS — Z1211 Encounter for screening for malignant neoplasm of colon: Secondary | ICD-10-CM | POA: Diagnosis not present

## 2016-09-03 DIAGNOSIS — E1169 Type 2 diabetes mellitus with other specified complication: Secondary | ICD-10-CM | POA: Insufficient documentation

## 2016-09-03 DIAGNOSIS — Z79810 Long term (current) use of selective estrogen receptor modulators (SERMs): Secondary | ICD-10-CM | POA: Diagnosis not present

## 2016-09-03 MED ORDER — SIMVASTATIN 10 MG PO TABS: 10.0000 mg | ORAL_TABLET | Freq: Every day | ORAL | 3 refills | Status: DC

## 2016-09-03 MED ORDER — PANTOPRAZOLE SODIUM 40 MG PO TBEC: 40.0000 mg | DELAYED_RELEASE_TABLET | Freq: Two times a day (BID) | ORAL | 3 refills | Status: DC

## 2016-09-03 MED ORDER — LISINOPRIL 40 MG PO TABS: 40.0000 mg | ORAL_TABLET | Freq: Every day | ORAL | 3 refills | Status: DC

## 2016-09-03 MED ORDER — SERTRALINE HCL 50 MG PO TABS: 50.0000 mg | ORAL_TABLET | Freq: Every day | ORAL | 6 refills | Status: DC

## 2016-09-03 MED ORDER — AMLODIPINE BESYLATE 5 MG PO TABS: 5.0000 mg | ORAL_TABLET | Freq: Every day | ORAL | 3 refills | Status: DC

## 2016-09-03 MED ORDER — HYDROCHLOROTHIAZIDE 25 MG PO TABS: 25.0000 mg | ORAL_TABLET | Freq: Every day | ORAL | 3 refills | Status: DC

## 2016-09-03 NOTE — Addendum Note (Signed)
Addended by: Devona Konig on: 09/03/2016 03:58 PM   Modules accepted: Orders

## 2016-09-03 NOTE — Progress Notes (Addendum)
Name: Yvette [name]   MRN: [id number]    DOB: November 01, 1945   Date:09/03/2016       Progress Note  Subjective  Chief Complaint  Chief Complaint  Patient presents with  . Annual Exam    HPI Here for annual female health maintenance examination.  She has HBP, elevated lipids, GERD.  She c/o chronic anxiety especially with some life stresses.  She feels that this affects her BP.    No problem-specific Assessment & Plan notes found for this encounter.   Past Medical History:  Diagnosis Date  . Arthropathy, unspecified, site unspecified   . Esophageal reflux   . Osteoporosis, unspecified   . Other abnormal glucose   . Other and unspecified hyperlipidemia   . Symptomatic menopausal or female climacteric states   . Syncope and collapse   . Unspecified essential hypertension     Past Surgical History:  Procedure Laterality Date  . APPENDECTOMY    . BREAST BIOPSY Left   . COLONOSCOPY    . FINGER FRACTURE SURGERY      Family History  Problem Relation Age of Onset  . Diabetes Mother   . Heart disease Mother   . Heart attack Mother   . Ulcers Father   . Gallstones Father   . Heart attack Brother     Social History   Social History  . Marital status: Married    Spouse name: N/A  . Number of children: N/A  . Years of education: N/A   Occupational History  . Not on file.   Social History Main Topics  . Smoking status: Former Smoker    Packs/day: 1.00    Years: 26.00    Types: Cigarettes    Quit date: 06/07/1992  . Smokeless tobacco: Never Used  . Alcohol use 0.0 oz/week     Comment: occassional  . Drug use: No  . Sexual activity: Not on file   Other Topics Concern  . Not on file   Social History Narrative  . No narrative on file     Current Outpatient Prescriptions:  .  amLODipine (NORVASC) 5 MG tablet, Take 5 mg by mouth daily. , Disp: , Rfl:  .  aspirin 81 MG tablet, Take 81 mg by mouth daily., Disp: , Rfl:  .  beta carotene w/minerals (OCUVITE) tablet,  Take 1 tablet by mouth daily., Disp: , Rfl:  .  Calcium Carb-Cholecalciferol (CALTRATE 600+D) 600-800 MG-UNIT TABS, Take 2 tablets by mouth daily., Disp: , Rfl:  .  Cyanocobalamin (VITAMIN B 12 PO), Take 500 mg by mouth daily., Disp: , Rfl:  .  EVISTA 60 MG tablet, Take 60 mg by mouth daily. , Disp: , Rfl:  .  hydrochlorothiazide (HYDRODIURIL) 25 MG tablet, Take 25 mg by mouth daily., Disp: , Rfl:  .  Krill Oil 300 MG CAPS, Take by mouth daily., Disp: , Rfl:  .  lisinopril (PRINIVIL,ZESTRIL) 40 MG tablet, Take 40 mg by mouth daily., Disp: , Rfl:  .  Multiple Vitamin (MULTIVITAMIN) tablet, Take 1 tablet by mouth daily., Disp: , Rfl:  .  naproxen (NAPROSYN) 250 MG tablet, Take 250 mg by mouth as needed., Disp: , Rfl:  .  pantoprazole (PROTONIX) 40 MG tablet, Take 40 mg by mouth 2 (two) times daily., Disp: , Rfl:  .  simvastatin (ZOCOR) 10 MG tablet, Take 10 mg by mouth at bedtime. , Disp: , Rfl:   Allergies  Allergen Reactions  . Erythromycin Nausea And Vomiting  Review of Systems  Constitutional: Negative for chills, fever, malaise/fatigue and weight loss.  HENT: Negative for hearing loss and tinnitus.   Eyes: Negative for blurred vision and double vision.  Respiratory: Negative for cough, shortness of breath and wheezing.   Cardiovascular: Negative for chest pain, palpitations and leg swelling.  Gastrointestinal: Negative for abdominal pain, blood in stool and heartburn.  Genitourinary: Negative for dysuria, frequency and urgency.  Musculoskeletal: Positive for back pain (almost daily low back pain, especially in the AM.).  Skin: Negative for rash.  Neurological: Negative for dizziness, tingling, tremors, weakness and headaches.  Psychiatric/Behavioral: The patient is nervous/anxious. The patient does not have insomnia.       Objective  Vitals:   09/03/16 1356  BP: (!) 175/80  Pulse: 67  Resp: 16  Temp: 97.7 F (36.5 C)  TempSrc: Oral  Weight: 225 lb (102.1 kg)   Height: 5\' 6"  (1.676 m)    Physical Exam  Constitutional: She is oriented to person, place, and time and well-developed, well-nourished, and in no distress. No distress.  HENT:  Head: Normocephalic and atraumatic.  Right Ear: External ear normal.  Left Ear: External ear normal.  Nose: Nose normal.  Mouth/Throat: Oropharynx is clear and moist. No oropharyngeal exudate.  Eyes: Conjunctivae and EOM are normal. Pupils are equal, round, and reactive to light. No scleral icterus.  Neck: Normal range of motion. Neck supple. Carotid bruit is not present. No tracheal deviation present. No thyromegaly present.  Cardiovascular: Normal rate, regular rhythm and normal heart sounds.  Exam reveals no gallop and no friction rub.   No murmur heard. Pulmonary/Chest: Effort normal and breath sounds normal. No respiratory distress. She has no wheezes. She has no rales. Right breast exhibits no inverted nipple, no mass, no nipple discharge, no skin change and no tenderness. Left breast exhibits no inverted nipple, no mass, no nipple discharge, no skin change and no tenderness. Breasts are symmetrical.  Abdominal: Soft. Bowel sounds are normal. She exhibits no distension and no mass. There is no tenderness.  Genitourinary: Vagina normal, uterus normal, cervix normal, right adnexa normal and left adnexa normal.  Musculoskeletal: Normal range of motion. She exhibits edema (Trace bilateral pedal edema).  Lymphadenopathy:    She has no cervical adenopathy.  Neurological: She is alert and oriented to person, place, and time. Gait normal.  Skin: Skin is warm and dry.  Psychiatric:  Affect is mildly anxious  Vitals reviewed.      No results found for this or any previous visit (from the past 2160 hour(s)).   Assessment & Plan  Problem List Items Addressed This Visit    None    Visit Diagnoses    Breast cancer screening    -  Primary   Relevant Orders   MM Digital Screening      No orders of the  defined types were placed in this encounter.

## 2016-09-08 ENCOUNTER — Encounter: Payer: Self-pay | Admitting: *Deleted

## 2016-09-08 ENCOUNTER — Ambulatory Visit
Admission: EM | Admit: 2016-09-08 | Discharge: 2016-09-08 | Disposition: A | Discharge status: Home / Self Care | Payer: Medicare Other | Attending: Family Medicine | Admitting: Family Medicine

## 2016-09-08 DIAGNOSIS — H1031 Unspecified acute conjunctivitis, right eye: Secondary | ICD-10-CM

## 2016-09-08 MED ORDER — MOXIFLOXACIN HCL 0.5 % OP SOLN: 1.0000 [drp] | Freq: Three times a day (TID) | OPHTHALMIC | 0 refills | Status: DC

## 2016-09-08 NOTE — Discharge Instructions (Signed)
Cool compresses to affected eye

## 2016-09-08 NOTE — ED Provider Notes (Signed)
[facility]    CSN: [id number] Arrival date & time: 09/08/16  0848     History   Chief Complaint Chief Complaint  Patient presents with  . Eye Pain    HPI Yvette [name] is a 70 y.o. female.   70 yo female with a c/o right eye redness, discomfort and watery drainage since this morning. States she woke up during the night and felt like there was something in her eye. Denies any injury/trauma.   The history is provided by the patient.  Eye Pain     Past Medical History:  Diagnosis Date  . Arthropathy, unspecified, site unspecified   . Esophageal reflux   . Osteoporosis, unspecified   . Other abnormal glucose   . Other and unspecified hyperlipidemia   . Symptomatic menopausal or female climacteric states   . Syncope and collapse   . Unspecified essential hypertension     Patient Active Problem List   Diagnosis Date Noted  . Anxiety disorder 09/03/2016  . GERD (gastroesophageal reflux disease) 09/03/2016  . Prophylactic use of raloxifene (Evista) 09/03/2016  . Hyperlipidemia 09/03/2016  . Hyperglycemia 09/12/2015  . Bacteremia 06/08/2015  . Sepsis ([place]) 06/08/2015  . Dyspnea 07/23/2013  . 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 Essential hypertension     Past Surgical History:  Procedure Laterality Date  . APPENDECTOMY    . BREAST BIOPSY Left   . COLONOSCOPY    . FINGER FRACTURE SURGERY      OB History    Gravida Para Term Preterm AB Living   2         2   SAB TAB Ectopic Multiple Live Births                   Home Medications    Prior to Admission medications   Medication Sig Start Date End Date Taking? Authorizing Provider  amLODipine (NORVASC) 5 MG tablet Take 1 tablet (5 mg total) by mouth daily. 09/03/16  Yes Arlis Porta., MD  aspirin 81 MG tablet Take 81 mg by mouth daily.   Yes Historical Provider, MD  beta carotene w/minerals (OCUVITE) tablet Take 1 tablet by mouth daily.   Yes Historical Provider, MD  Calcium  Carb-Cholecalciferol (CALTRATE 600+D) 600-800 MG-UNIT TABS Take 2 tablets by mouth daily.   Yes Historical Provider, MD  Cyanocobalamin (VITAMIN B 12 PO) Take 500 mg by mouth daily.   Yes Historical Provider, MD  hydrochlorothiazide (HYDRODIURIL) 25 MG tablet Take 1 tablet (25 mg total) by mouth daily. 09/03/16  Yes Arlis Porta., MD  Krill Oil 300 MG CAPS Take by mouth daily.   Yes Historical Provider, MD  lisinopril (PRINIVIL,ZESTRIL) 40 MG tablet Take 1 tablet (40 mg total) by mouth daily. 09/03/16  Yes Arlis Porta., MD  Multiple Vitamin (MULTIVITAMIN) tablet Take 1 tablet by mouth daily.   Yes Historical Provider, MD  naproxen (NAPROSYN) 250 MG tablet Take 250 mg by mouth as needed.   Yes Historical Provider, MD  pantoprazole (PROTONIX) 40 MG tablet Take 1 tablet (40 mg total) by mouth 2 (two) times daily. 09/03/16  Yes Arlis Porta., MD  raloxifene (EVISTA) 60 MG tablet TAKE 1 TABLET BY MOUTH ONCE DAILY 09/03/16  Yes Arlis Porta., MD  sertraline (ZOLOFT) 50 MG tablet Take 1 tablet (50 mg total) by mouth daily. 09/03/16  Yes Arlis Porta., MD  simvastatin (ZOCOR) 10 MG tablet Take 1 tablet (  10 mg total) by mouth at bedtime. 09/03/16  Yes Arlis Porta., MD  moxifloxacin (VIGAMOX) 0.5 % ophthalmic solution Place 1 drop into the right eye 3 (three) times daily. 09/08/16   Norval Gable, MD    Family History Family History  Problem Relation Age of Onset  . Diabetes Mother   . Heart disease Mother   . Heart attack Mother   . Ulcers Father   . Gallstones Father   . Heart attack Brother     Social History Social History  Substance Use Topics  . Smoking status: Former Smoker    Packs/day: 1.00    Years: 26.00    Types: Cigarettes    Quit date: 06/07/1992  . Smokeless tobacco: Never Used  . Alcohol use 0.0 oz/week     Comment: occassional     Allergies   Erythromycin   Review of Systems Review of Systems  Eyes: Positive for pain.      Physical Exam Triage Vital Signs ED Triage Vitals  Enc Vitals Group     BP 09/08/16 0920 (!) 144/71     Pulse Rate 09/08/16 0920 66     Resp 09/08/16 0920 16     Temp 09/08/16 0920 97.7 F (36.5 C)     Temp Source 09/08/16 0920 Oral     SpO2 09/08/16 0920 96 %     Weight 09/08/16 0923 225 lb (102.1 kg)     Height 09/08/16 0923 5\' 6"  (1.676 m)     Head Circumference --      Peak Flow --      Pain Score --      Pain Loc --      Pain Edu? --      Excl. in [place]? --    No data found.   Updated Vital Signs BP (!) 144/71 (BP Location: Left Arm)   Pulse 66   Temp 97.7 F (36.5 C) (Oral)   Resp 16   Ht 5\' 6"  (1.676 m)   Wt 225 lb (102.1 kg)   SpO2 96%   BMI 36.32 kg/m   Visual Acuity Right Eye Distance:   Left Eye Distance:   Bilateral Distance:    Right Eye Near:   Left Eye Near:    Bilateral Near:     Physical Exam  Eyes: EOM are normal. Pupils are equal, round, and reactive to light. Right eye exhibits no discharge. Left eye exhibits no discharge. Right conjunctiva is injected. No scleral icterus.  Nursing note and vitals reviewed.    UC Treatments / Results  Labs (all labs ordered are listed, but only abnormal results are displayed) Labs Reviewed - No data to display  EKG  EKG Interpretation None       Radiology No results found.  Procedures Procedures (including critical care time)  Medications Ordered in UC Medications - No data to display   Initial Impression / Assessment and Plan / UC Course  I have reviewed the triage vital signs and the nursing notes.  Pertinent labs & imaging results that were available during my care of the patient were reviewed by me and considered in my medical decision making (see chart for details).  Clinical Course       Final Clinical Impressions(s) / UC Diagnoses   Final diagnoses:  Acute conjunctivitis of right eye, unspecified acute conjunctivitis type    New Prescriptions Discharge Medication  List as of 09/08/2016  9:44 AM    START taking these medications  Details  moxifloxacin (VIGAMOX) 0.5 % ophthalmic solution Place 1 drop into the right eye 3 (three) times daily., Starting Sun 09/08/2016, Normal       1. diagnosis reviewed with patient 2. rx as per orders above; reviewed possible side effects, interactions, risks and benefits  3. Recommend supportive treatment with cool compresses 4. Follow-up prn if symptoms worsen or don't improve   Norval Gable, MD 09/08/16 [phone]

## 2016-09-08 NOTE — ED Triage Notes (Signed)
Pt fell asleep last night with her right eye in the pillow and awoke with the feeling that her eye may have open while buried in the pillow. Now c/o redness, pain, tearing, and light sensitivity to right eye.

## 2016-09-11 ENCOUNTER — Telehealth: Payer: Self-pay

## 2016-09-11 NOTE — Telephone Encounter (Signed)
Courtesy call back completed today for patient's recent visit at [facility]. Patient did not answer, left message on machine to call back with any questions or concerns.   

## 2016-09-19 ENCOUNTER — Ambulatory Visit
Admission: RE | Admit: 2016-09-19 | Discharge: 2016-09-19 | Disposition: A | Discharge status: Home / Self Care | Payer: Medicare Other | Source: Ambulatory Visit | Attending: Family Medicine | Admitting: Family Medicine

## 2016-09-19 DIAGNOSIS — Z1231 Encounter for screening mammogram for malignant neoplasm of breast: Secondary | ICD-10-CM | POA: Insufficient documentation

## 2016-09-19 DIAGNOSIS — Z1239 Encounter for other screening for malignant neoplasm of breast: Secondary | ICD-10-CM

## 2016-10-10 ENCOUNTER — Ambulatory Visit: Payer: Medicare Other | Admitting: Family Medicine

## 2016-10-14 ENCOUNTER — Encounter: Payer: Self-pay | Admitting: Family Medicine

## 2016-10-14 ENCOUNTER — Ambulatory Visit (INDEPENDENT_AMBULATORY_CARE_PROVIDER_SITE_OTHER): Payer: Medicare Other | Admitting: Family Medicine

## 2016-10-14 VITALS — BP 140/75 | HR 79 | Temp 97.8°F | Resp 16 | Ht 67.0 in | Wt 224.0 lb

## 2016-10-14 DIAGNOSIS — K219 Gastro-esophageal reflux disease without esophagitis: Secondary | ICD-10-CM

## 2016-10-14 DIAGNOSIS — I10 Essential (primary) hypertension: Secondary | ICD-10-CM

## 2016-10-14 DIAGNOSIS — M720 Palmar fascial fibromatosis [Dupuytren]: Secondary | ICD-10-CM

## 2016-10-14 DIAGNOSIS — E784 Other hyperlipidemia: Secondary | ICD-10-CM

## 2016-10-14 DIAGNOSIS — Z79810 Long term (current) use of selective estrogen receptor modulators (SERMs): Secondary | ICD-10-CM

## 2016-10-14 DIAGNOSIS — F419 Anxiety disorder, unspecified: Secondary | ICD-10-CM

## 2016-10-14 DIAGNOSIS — E7849 Other hyperlipidemia: Secondary | ICD-10-CM

## 2016-10-14 MED ORDER — SERTRALINE HCL 50 MG PO TABS: 50.0000 mg | ORAL_TABLET | Freq: Every day | ORAL | 3 refills | Status: DC

## 2016-10-14 MED ORDER — RALOXIFENE HCL 60 MG PO TABS: 60.0000 mg | ORAL_TABLET | Freq: Every day | ORAL | 3 refills | Status: DC

## 2016-10-14 NOTE — Progress Notes (Signed)
Name: Yvette [name]   MRN: [id number]    DOB: Aug 08, 1946   Date:10/14/2016       Progress Note  Subjective  Chief Complaint  Chief Complaint  Patient presents with  . Hypertension  . Hyperlipidemia  . Gastroesophageal Reflux    HPI Here for f/u of HBP and elevated lipids and GERD.  She has not sent in Cologuard yet.  Taking all meds.  She is now sleeping through the night  No problem-specific Assessment & Plan notes found for this encounter.   Past Medical History:  Diagnosis Date  . Arthropathy, unspecified, site unspecified   . Esophageal reflux   . Osteoporosis, unspecified   . Other abnormal glucose   . Other and unspecified hyperlipidemia   . Symptomatic menopausal or female climacteric states   . Syncope and collapse   . Unspecified essential hypertension     Past Surgical History:  Procedure Laterality Date  . ABDOMINAL HYSTERECTOMY    . APPENDECTOMY    . BREAST BIOPSY Left    neg  . COLONOSCOPY    . FINGER FRACTURE SURGERY      Family History  Problem Relation Age of Onset  . Diabetes Mother   . Heart disease Mother   . Heart attack Mother   . Breast cancer Mother 68  . Ulcers Father   . Gallstones Father   . Heart attack Brother     Social History   Social History  . Marital status: Married    Spouse name: N/A  . Number of children: N/A  . Years of education: N/A   Occupational History  . Not on file.   Social History Main Topics  . Smoking status: Former Smoker    Packs/day: 1.00    Years: 26.00    Types: Cigarettes    Quit date: 06/07/1992  . Smokeless tobacco: Never Used  . Alcohol use 0.0 oz/week     Comment: occassional  . Drug use: No  . Sexual activity: Not on file   Other Topics Concern  . Not on file   Social History Narrative  . No narrative on file     Current Outpatient Prescriptions:  .  amLODipine (NORVASC) 5 MG tablet, Take 1 tablet (5 mg total) by mouth daily., Disp: 90 tablet, Rfl: 3 .  aspirin 81 MG tablet,  Take 81 mg by mouth daily., Disp: , Rfl:  .  beta carotene w/minerals (OCUVITE) tablet, Take 1 tablet by mouth daily., Disp: , Rfl:  .  Calcium Carb-Cholecalciferol (CALTRATE 600+D) 600-800 MG-UNIT TABS, Take 2 tablets by mouth daily., Disp: , Rfl:  .  Cyanocobalamin (VITAMIN B 12 PO), Take 500 mg by mouth daily., Disp: , Rfl:  .  hydrochlorothiazide (HYDRODIURIL) 25 MG tablet, Take 1 tablet (25 mg total) by mouth daily., Disp: 90 tablet, Rfl: 3 .  Krill Oil 300 MG CAPS, Take by mouth daily., Disp: , Rfl:  .  lisinopril (PRINIVIL,ZESTRIL) 40 MG tablet, Take 1 tablet (40 mg total) by mouth daily., Disp: 90 tablet, Rfl: 3 .  moxifloxacin (VIGAMOX) 0.5 % ophthalmic solution, Place 1 drop into the right eye 3 (three) times daily. (Patient taking differently: Place 1 drop into the right eye 3 (three) times daily as needed. ), Disp: 3 mL, Rfl: 0 .  Multiple Vitamin (MULTIVITAMIN) tablet, Take 1 tablet by mouth daily., Disp: , Rfl:  .  naproxen (NAPROSYN) 250 MG tablet, Take 250 mg by mouth as needed., Disp: , Rfl:  .  pantoprazole (PROTONIX) 40 MG tablet, Take 1 tablet (40 mg total) by mouth 2 (two) times daily., Disp: 180 tablet, Rfl: 3 .  sertraline (ZOLOFT) 50 MG tablet, Take 1 tablet (50 mg total) by mouth daily., Disp: 90 tablet, Rfl: 3 .  simvastatin (ZOCOR) 10 MG tablet, Take 1 tablet (10 mg total) by mouth at bedtime., Disp: 90 tablet, Rfl: 3 .  raloxifene (EVISTA) 60 MG tablet, Take 1 tablet (60 mg total) by mouth daily., Disp: 90 tablet, Rfl: 3  Allergies  Allergen Reactions  . Erythromycin Nausea And Vomiting     Review of Systems  Constitutional: Negative for chills, fever, malaise/fatigue and weight loss.  HENT: Negative for hearing loss and tinnitus.   Eyes: Negative for blurred vision and double vision.  Respiratory: Negative for cough, hemoptysis and wheezing.   Cardiovascular: Negative for chest pain, palpitations and leg swelling.  Gastrointestinal: Negative for abdominal pain,  blood in stool, heartburn and nausea.  Genitourinary: Negative for dysuria, frequency and urgency.  Skin: Negative for rash.  Neurological: Negative for dizziness, tingling, tremors, weakness and headaches.      Objective  Vitals:   10/14/16 1339 10/14/16 1406  BP: (!) 141/76 140/75  Pulse: 79   Resp: 16   Temp: 97.8 F (36.6 C)   TempSrc: Oral   Weight: 224 lb (101.6 kg)   Height: 5\' 7"  (1.702 m)     Physical Exam  Constitutional: She is oriented to person, place, and time and well-developed, well-nourished, and in no distress. No distress.  HENT:  Head: Normocephalic and atraumatic.  Eyes: Conjunctivae and EOM are normal. Pupils are equal, round, and reactive to light. No scleral icterus.  Neck: Normal range of motion. Neck supple. Carotid bruit is not present. No thyromegaly present.  Cardiovascular: Normal rate, regular rhythm and normal heart sounds.  Exam reveals no gallop and no friction rub.   No murmur heard. Pulmonary/Chest: Effort normal and breath sounds normal. No respiratory distress. She has no wheezes. She has no rales.  Abdominal: Soft. Bowel sounds are normal. She exhibits no distension and no mass. There is no tenderness.  Musculoskeletal: She exhibits no edema.  Duptyren's contracture of L 5th finger.  Lymphadenopathy:    She has no cervical adenopathy.  Neurological: She is alert and oriented to person, place, and time.  Vitals reviewed.      No results found for this or any previous visit (from the past 2160 hour(s)).   Assessment & Plan  Problem List Items Addressed This Visit      Cardiovascular and Mediastinum   Essential hypertension - Primary   Relevant Orders   COMPLETE METABOLIC PANEL WITH GFR     Digestive   GERD (gastroesophageal reflux disease)   Relevant Orders   CBC with Differential     Musculoskeletal and Integument   Dupuytren's contracture of left hand   Relevant Orders   Ambulatory referral to Hand Surgery      Other   Anxiety disorder   Relevant Medications   sertraline (ZOLOFT) 50 MG tablet   Prophylactic use of raloxifene ([place])   Relevant Medications   raloxifene (EVISTA) 60 MG tablet   Hyperlipidemia   Relevant Orders   Lipid Profile      Meds ordered this encounter  Medications  . sertraline (ZOLOFT) 50 MG tablet    Sig: Take 1 tablet (50 mg total) by mouth daily.    Dispense:  90 tablet    Refill:  3  . raloxifene (  EVISTA) 60 MG tablet    Sig: Take 1 tablet (60 mg total) by mouth daily.    Dispense:  90 tablet    Refill:  3   1. Essential hypertension Cont HCTZ, Lisinopril, and Am lodipine - COMPLETE METABOLIC PANEL WITH GFR  2. Gastroesophageal reflux disease without esophagitis  - CBC with Differential  3. Other hyperlipidemia  - Lipid Profile  4. Anxiety disorder, unspecified type  - sertraline (ZOLOFT) 50 MG tablet; Take 1 tablet (50 mg total) by mouth daily.  Dispense: 90 tablet; Refill: 3-cont.  5. Prophylactic use of raloxifene ([place])  - raloxifene (EVISTA) 60 MG tablet; Take 1 tablet (60 mg total) by mouth daily.  Dispense: 90 tablet; Refill: 3  6. Dupuytren's contracture of left hand  - Ambulatory referral to Hand Surgery

## 2016-10-16 ENCOUNTER — Other Ambulatory Visit: Payer: Self-pay | Admitting: Family Medicine

## 2016-10-16 ENCOUNTER — Other Ambulatory Visit: Payer: Medicare Other

## 2016-10-16 DIAGNOSIS — E784 Other hyperlipidemia: Secondary | ICD-10-CM | POA: Diagnosis not present

## 2016-10-16 DIAGNOSIS — K219 Gastro-esophageal reflux disease without esophagitis: Secondary | ICD-10-CM | POA: Diagnosis not present

## 2016-10-16 DIAGNOSIS — R739 Hyperglycemia, unspecified: Secondary | ICD-10-CM | POA: Diagnosis not present

## 2016-10-16 DIAGNOSIS — I10 Essential (primary) hypertension: Secondary | ICD-10-CM | POA: Diagnosis not present

## 2016-10-17 LAB — COMPLETE METABOLIC PANEL WITH GFR
ALT: 13 U/L (ref 6–29)
AST: 15 U/L (ref 10–35)
Albumin: 3.9 g/dL (ref 3.6–5.1)
Alkaline Phosphatase: 63 U/L (ref 33–130)
BUN: 19 mg/dL (ref 7–25)
CO2: 29 mmol/L (ref 20–31)
Calcium: 8.9 mg/dL (ref 8.6–10.4)
Chloride: 104 mmol/L (ref 98–110)
Creat: 0.66 mg/dL (ref 0.60–0.93)
GFR, Est African American: >89 mL/min (ref >=60)
GFR, Est Non African American: 89 mL/min (ref >=60)
Glucose, Bld: 156 mg/dL — ABNORMAL HIGH (ref 65–99)
Potassium: 4 mmol/L (ref 3.5–5.3)
Sodium: 139 mmol/L (ref 135–146)
Total Bilirubin: 0.6 mg/dL (ref 0.2–1.2)
Total Protein: 6.1 g/dL (ref 6.1–8.1)

## 2016-10-17 LAB — CBC WITH DIFFERENTIAL/PLATELET
Basophils Absolute: 0 cells/uL (ref 0–200)
Basophils Relative: 0 %
Eosinophils Absolute: 76 cells/uL (ref 15–500)
Eosinophils Relative: 2 %
HCT: 38 % (ref 35.0–45.0)
Hemoglobin: 12.4 g/dL (ref 11.7–15.5)
Lymphocytes Relative: 28 %
Lymphs Abs: 1064 cells/uL (ref 850–3900)
MCH: 29.6 pg (ref 27.0–33.0)
MCHC: 32.6 g/dL (ref 32.0–36.0)
MCV: 90.7 fL (ref 80.0–100.0)
MPV: 11.3 fL (ref 7.5–12.5)
Monocytes Absolute: 418 cells/uL (ref 200–950)
Monocytes Relative: 11 %
Neutro Abs: 2242 cells/uL (ref 1500–7800)
Neutrophils Relative %: 59 %
Platelets: 173 10*3/uL (ref 140–400)
RBC: 4.19 MIL/uL (ref 3.80–5.10)
RDW: 14.7 % (ref 11.0–15.0)
WBC: 3.8 10*3/uL (ref 3.8–10.8)

## 2016-10-17 LAB — LIPID PANEL
Cholesterol: 175 mg/dL (ref <200)
HDL: 42 mg/dL — ABNORMAL LOW (ref >50)
LDL Cholesterol: 74 mg/dL (ref <100)
Total CHOL/HDL Ratio: 4.2 Ratio (ref <5.0)
Triglycerides: 296 mg/dL — ABNORMAL HIGH (ref <150)
VLDL: 59 mg/dL — ABNORMAL HIGH (ref <30)

## 2016-10-17 MED ORDER — SIMVASTATIN 20 MG PO TABS: 20.0000 mg | ORAL_TABLET | Freq: Every day | ORAL | 3 refills | Status: DC

## 2016-10-17 NOTE — Addendum Note (Signed)
Addended by: Devona Konig on: 10/17/2016 01:44 PM   Modules accepted: Orders

## 2016-10-18 LAB — HEMOGLOBIN A1C
Hgb A1c MFr Bld: 5.8 % — ABNORMAL HIGH (ref <5.7)
Mean Plasma Glucose: 120 mg/dL

## 2016-11-07 ENCOUNTER — Ambulatory Visit (INDEPENDENT_AMBULATORY_CARE_PROVIDER_SITE_OTHER): Payer: Medicare Other | Admitting: Family Medicine

## 2016-11-07 ENCOUNTER — Encounter: Payer: Self-pay | Admitting: Family Medicine

## 2016-11-07 VITALS — BP 138/74 | HR 63 | Temp 98.0°F | Resp 16 | Ht 67.0 in | Wt 227.0 lb

## 2016-11-07 DIAGNOSIS — L120 Bullous pemphigoid: Secondary | ICD-10-CM

## 2016-11-07 MED ORDER — DOXYCYCLINE HYCLATE 100 MG PO TABS: 100.0000 mg | ORAL_TABLET | Freq: Two times a day (BID) | ORAL | 0 refills | Status: DC

## 2016-11-07 MED ORDER — CLOBETASOL PROPIONATE 0.05 % EX OINT: 1.0000 "application " | TOPICAL_OINTMENT | Freq: Two times a day (BID) | CUTANEOUS | 0 refills | Status: DC

## 2016-11-07 NOTE — Progress Notes (Signed)
Subjective:    Patient ID: Yvette [name], female    DOB: 1946/02/16, 71 y.o.   MRN: [id number]  Yvette [name] is a 71 y.o. female presenting on 11/07/2016 for Wound Check (had sore Left lower abdominal area pt doen't know how she got wound but noticed week ago)  Patient presents for a same day appointment.  HPI  SUSPECTED BULLOUS PEMPPHIGOID / LEFT LOWER ABDOMINAL ULCERATION - Reports symptoms started about 1 week ago with lower abdominal sore with some blistering, unsure where it came from or known trigger, describes gradually worsening with open ulceration now on skin with some clear drainage, mild bleeding recently started and some burning discomfort with break in the skin. Several other smaller blister areas associated in this area. Tried some topical neosporin without relief. - History of impetigo as child but no other similar problems recently - Admits some mild intermittent chills (without shaking or rigors) and x 1 sweat last night (does get recurrent night sweats routinely) - No recent injury trauma or any medication injections in abdomen - Followed by [facility] for other reasons - Denies fevers, nausea, purulent drainage, vomiting, diarrhea, other rashes or skin blisters or skin infection  Social History  Substance Use Topics  . Smoking status: Former Smoker    Packs/day: 1.00    Years: 26.00    Types: Cigarettes    Quit date: 06/07/1992  . Smokeless tobacco: Never Used  . Alcohol use 0.0 oz/week     Comment: occassional    Review of Systems Per HPI unless specifically indicated above     Objective:    BP 138/74 (BP Location: Left Arm, Cuff Size: Normal)   Pulse 63   Temp 98 F (36.7 C) (Oral)   Resp 16   Ht 5\' 7"  (1.702 m)   Wt 227 lb (103 kg)   SpO2 96%   BMI 35.55 kg/m   Wt Readings from Last 3 Encounters:  11/07/16 227 lb (103 kg)  10/14/16 224 lb (101.6 kg)  09/08/16 225 lb (102.1 kg)    Physical Exam  Constitutional: She appears  well-developed and well-nourished. No distress.  Well-appearing, comfortable, cooperative  HENT:  Head: Normocephalic and atraumatic.  Mouth/Throat: Oropharynx is clear and moist.  No oral mucosal lesions, ulcerations  Eyes: Conjunctivae are normal.  Cardiovascular: Normal rate.   Pulmonary/Chest: Effort normal.  Abdominal: Soft. Bowel sounds are normal. She exhibits no distension. There is no tenderness.  Neurological: She is alert.  Skin: Skin is warm and dry. Rash (Localized mid to left lower abdomen with few small scattered blisters) noted. She is not diaphoretic. No erythema.  Lower Abdomen, Left - One larger area approx 2-3 cm of open ulcerated superificial skin with some mild bleeding, no extending erythema, non tender to palpation, no induration or fluctuance, no drainage. Nearby laterally x 2 approx 1 cm intact clear to honey colored appearing soft skin blisters with self contained fluid without leakage or ulceration, non ruptured. Other x 2-3 smaller areas of superficial ulceration from likely prior blister sloughing  Nursing note and vitals reviewed.          Results for orders placed or performed in visit on 10/16/16  Hemoglobin A1c  Result Value Ref Range   Hgb A1c MFr Bld 5.8 (H) <5.7 %   Mean Plasma Glucose 120 mg/dL      Assessment & Plan:   Problem List Items Addressed This Visit    Bullous pemphigoid - Primary  Suspected bullous pemphigoid based on clinical appearance of blistering localized rash on lower abdomen, near skin fold in 70 yr patient, without other known etiology. Clinically no evidence of secondary bacterial infection, cellulitis or abscess today. Only mild bleeding with primary sloughed superficial skin ulceration, some intact blisters.  Plan: 1. Start high potency topical steroid Clobetasol 0.05% ointment BID for up to 1-2 weeks 2. Also given empiric oral antibiotic given extensive 3 x 3 cm approx area of ulceration with sloughed blister with  some pain and localized erythema provide coverage for possible bacterial infection if worsening 3. Routine skin care for open ulcerations, wash, keep dry, can keep covered vaseline or ointment for protection 4. Recommend close follow-up with her already established Dermatologist at [facility] for further confirmation of this diagnosis, may need biopsy or additional therapy 5. Strict return criteria given if dramatic worsening spreading rash, sign of infection or systemic illness      Relevant Medications   clobetasol ointment (TEMOVATE) 0.05 %   doxycycline (VIBRA-TABS) 100 MG tablet      Meds ordered this encounter  Medications  . clobetasol ointment (TEMOVATE) 0.05 %    Sig: Apply 1 application topically 2 (two) times daily. For up to 2 weeks, or until healed.    Dispense:  30 g    Refill:  0  . doxycycline (VIBRA-TABS) 100 MG tablet    Sig: Take 1 tablet (100 mg total) by mouth 2 (two) times daily. For 7 days, take with full glass of water, stay upright 30 min after taking.    Dispense:  14 tablet    Refill:  0      Follow up plan: Return in about 2 weeks (around 11/21/2016), or if symptoms worsen or fail to improve, for rash.  Nobie Putnam, [facility] 11/07/2016, 1:00 PM

## 2016-11-07 NOTE — Assessment & Plan Note (Signed)
Suspected bullous pemphigoid based on clinical appearance of blistering localized rash on lower abdomen, near skin fold in 70 yr patient, without other known etiology. Clinically no evidence of secondary bacterial infection, cellulitis or abscess today. Only mild bleeding with primary sloughed superficial skin ulceration, some intact blisters.  Plan: 1. Start high potency topical steroid Clobetasol 0.05% ointment BID for up to 1-2 weeks 2. Also given empiric oral antibiotic given extensive 3 x 3 cm approx area of ulceration with sloughed blister with some pain and localized erythema provide coverage for possible bacterial infection if worsening 3. Routine skin care for open ulcerations, wash, keep dry, can keep covered vaseline or ointment for protection 4. Recommend close follow-up with her already established Dermatologist at [facility] for further confirmation of this diagnosis, may need biopsy or additional therapy 5. Strict return criteria given if dramatic worsening spreading rash, sign of infection or systemic illness

## 2016-11-07 NOTE — Patient Instructions (Signed)
Thank you for coming in to clinic today.  1. I am concerned for a blistering problem such as Bullous Pempigoid, it may not be this severe, and it appears to be a milder or more localized case. It may be due to a contact with something similar to an allergy. Otherwise, can be auto immune or due to other triggers. We may never know the exact cause.  Start with Clobetasol topical ointment steroid, use twice a day for up to 2 weeks, sooner if resolves, dont use anywhere else on body - Keep clean, routine skin care, can keep covered, cautious with tape and adhesive - Also can take Doxycycline antibiotic for preventative to avoid skin infection, take twice a day for 7 days  If worsening possible skin infection, seek more emergent treatment if spreading redness, significant swelling, drainage of pus  Recommend more urgent follow-up with [facility] for further evaluation.  Please schedule a follow-up appointment with Dr. Parks Ranger / Dr Luan Pulling - within 2 weeks for follow-up rash if not improved  If you have any other questions or concerns, please feel free to call the clinic or send a message through [facility]. You may also schedule an earlier appointment if necessary.  Nobie Putnam, DO [place]

## 2016-11-08 DIAGNOSIS — L249 Irritant contact dermatitis, unspecified cause: Secondary | ICD-10-CM | POA: Diagnosis not present

## 2016-11-13 ENCOUNTER — Telehealth: Payer: Self-pay | Admitting: *Deleted

## 2016-11-13 NOTE — Telephone Encounter (Signed)
Left message in regards to deactivated colo-guard test order. Per Dr. Luan Pulling if patient would like order re-activated please let us know.

## 2016-11-14 ENCOUNTER — Encounter: Payer: Self-pay | Admitting: Family Medicine

## 2016-11-14 ENCOUNTER — Telehealth: Payer: Self-pay | Admitting: Family Medicine

## 2016-11-14 NOTE — Telephone Encounter (Signed)
Pt. Called requesting a nurse call her back  [phone]

## 2016-11-15 DIAGNOSIS — L249 Irritant contact dermatitis, unspecified cause: Secondary | ICD-10-CM | POA: Diagnosis not present

## 2016-11-15 DIAGNOSIS — L570 Actinic keratosis: Secondary | ICD-10-CM | POA: Diagnosis not present

## 2016-11-28 DIAGNOSIS — M720 Palmar fascial fibromatosis [Dupuytren]: Secondary | ICD-10-CM | POA: Diagnosis not present

## 2016-12-05 DIAGNOSIS — L821 Other seborrheic keratosis: Secondary | ICD-10-CM | POA: Diagnosis not present

## 2016-12-05 DIAGNOSIS — D485 Neoplasm of uncertain behavior of skin: Secondary | ICD-10-CM | POA: Diagnosis not present

## 2016-12-05 DIAGNOSIS — Z872 Personal history of diseases of the skin and subcutaneous tissue: Secondary | ICD-10-CM | POA: Diagnosis not present

## 2016-12-11 DIAGNOSIS — Z1211 Encounter for screening for malignant neoplasm of colon: Secondary | ICD-10-CM | POA: Diagnosis not present

## 2016-12-11 DIAGNOSIS — Z1212 Encounter for screening for malignant neoplasm of rectum: Secondary | ICD-10-CM | POA: Diagnosis not present

## 2016-12-11 LAB — COLOGUARD: Cologuard: NEGATIVE

## 2016-12-23 DIAGNOSIS — M720 Palmar fascial fibromatosis [Dupuytren]: Secondary | ICD-10-CM | POA: Diagnosis not present

## 2016-12-25 DIAGNOSIS — M720 Palmar fascial fibromatosis [Dupuytren]: Secondary | ICD-10-CM | POA: Diagnosis not present

## 2016-12-30 DIAGNOSIS — D229 Melanocytic nevi, unspecified: Secondary | ICD-10-CM | POA: Diagnosis not present

## 2016-12-30 DIAGNOSIS — D485 Neoplasm of uncertain behavior of skin: Secondary | ICD-10-CM | POA: Diagnosis not present

## 2016-12-31 DIAGNOSIS — M720 Palmar fascial fibromatosis [Dupuytren]: Secondary | ICD-10-CM | POA: Diagnosis not present

## 2017-01-01 DIAGNOSIS — M720 Palmar fascial fibromatosis [Dupuytren]: Secondary | ICD-10-CM | POA: Diagnosis not present

## 2017-01-02 ENCOUNTER — Ambulatory Visit (INDEPENDENT_AMBULATORY_CARE_PROVIDER_SITE_OTHER): Payer: Medicare Other | Admitting: Nurse Practitioner

## 2017-01-02 ENCOUNTER — Encounter: Payer: Self-pay | Admitting: Nurse Practitioner

## 2017-01-02 VITALS — BP 166/74 | HR 70 | Temp 98.0°F | Resp 16 | Ht 67.0 in | Wt 232.0 lb

## 2017-01-02 DIAGNOSIS — B9689 Other specified bacterial agents as the cause of diseases classified elsewhere: Secondary | ICD-10-CM

## 2017-01-02 DIAGNOSIS — H6121 Impacted cerumen, right ear: Secondary | ICD-10-CM

## 2017-01-02 DIAGNOSIS — J301 Allergic rhinitis due to pollen: Secondary | ICD-10-CM

## 2017-01-02 DIAGNOSIS — J019 Acute sinusitis, unspecified: Secondary | ICD-10-CM | POA: Diagnosis not present

## 2017-01-02 MED ORDER — AMOXICILLIN-POT CLAVULANATE 875-125 MG PO TABS: 1.0000 | ORAL_TABLET | Freq: Two times a day (BID) | ORAL | 0 refills | Status: AC

## 2017-01-02 MED ORDER — FLUTICASONE PROPIONATE 50 MCG/ACT NA SUSP: 2.0000 | Freq: Every day | NASAL | 1 refills | Status: DC

## 2017-01-02 NOTE — Patient Instructions (Signed)
Yvette [name], Thank you for coming in to clinic today.  It sounds like you have a bacterial sinus infection. 1. Recommend good hand washing. 2. Take augmentin 875-125 mg every 12 hours for the next 10 days. 3. Probiotic: eat yogurt, fresh sauerkraut, OR take an over the counter probiotic during your antibiotic use. 4. Use claritin daily for the next 4-6 weeks or as soon as you have signs of congestion from pollen. 5. Use Flonase 2 sprays each nostril daily for up to 4-6 weeks.  A known side effect is nose bleeds.  Spray away from the center of your nose.   If you need more for symptom relief: 4. If congestion is worse, start OTC Mucinex (or may try Mucinex-DM for cough) up to 7-10 days then stop.  Active ingredient guaifenesin.  Avoid decongestants. - Drink plenty of fluids to improve congestion - You may try over the counter Nasal Saline spray (Simply Saline, Ocean Spray) as needed to reduce congestion. - Drink warm herbal tea with honey for sore throat. - Start taking Tylenol 1 to 2 tablets (650mg ) every 6-8 hours for aches or fever/chills for next few days as needed.  Do not take more than 3,000 mg in 24 hours from all medicines.  If symptoms significantly worsening with persistent fevers/chills despite tylenol/ibpurofen, nausea, vomiting unable to tolerate food/fluids or medicine, body aches, or shortness of breath, sinus pain pressure or worsening productive cough, then follow-up for re-evaluation, may seek more immediate care at Urgent Care or ED if more concerned for emergency.  2. For your right ear: There is some hardened ear wax.  Use Debrox ear drops for 3-5 days to soften this ear wax.  Use a tissue or a washcloth to remove this as it starts to drain out.  Do not use q-tips.   Please schedule a follow-up appointment with Cassell Smiles, AGNP in as needed 5-7 days and with Dr. Raliegh Ip when you are next scheduled.  If you have any other questions or concerns, please feel free to call the clinic  or send a message through [place]. You may also schedule an earlier appointment if necessary.  Cassell Smiles, DNP, AGNP-BC Adult Gerontology Nurse Practitioner [facility]    Sinusitis, Adult Sinusitis is soreness and inflammation of your sinuses. Sinuses are hollow spaces in the bones around your face. Your sinuses are located:  Around your eyes.  In the middle of your forehead.  Behind your nose.  In your cheekbones. Your sinuses and nasal passages are lined with a stringy fluid (mucus). Mucus normally drains out of your sinuses. When your nasal tissues become inflamed or swollen, the mucus can become trapped or blocked so air cannot flow through your sinuses. This allows bacteria, viruses, and funguses to grow, which leads to infection. Sinusitis can develop quickly and last for 7?10 days (acute) or for more than 12 weeks (chronic). Sinusitis often develops after a cold. What are the causes? This condition is caused by anything that creates swelling in the sinuses or stops mucus from draining, including:  Allergies.  Asthma.  Bacterial or viral infection.  Abnormally shaped bones between the nasal passages.  Nasal growths that contain mucus (nasal polyps).  Narrow sinus openings.  Pollutants, such as chemicals or irritants in the air.  A foreign object stuck in the nose.  A fungal infection. This is rare. What increases the risk? The following factors may make you more likely to develop this condition:  Having allergies or asthma.  Having had a recent cold or respiratory tract infection.  Having structural deformities or blockages in your nose or sinuses.  Having a weak immune system.  Doing a lot of swimming or diving.  Overusing nasal sprays.  Smoking. What are the signs or symptoms? The main symptoms of this condition are pain and a feeling of pressure around the affected sinuses. Other symptoms include:  Upper  toothache.  Earache.  Headache.  Bad breath.  Decreased sense of smell and taste.  A cough that may get worse at night.  Fatigue.  Fever.  Thick drainage from your nose. The drainage is often green and it may contain pus (purulent).  Stuffy nose or congestion.  Postnasal drip. This is when extra mucus collects in the throat or back of the nose.  Swelling and warmth over the affected sinuses.  Sore throat.  Sensitivity to light. How is this diagnosed? This condition is diagnosed based on symptoms, a medical history, and a physical exam. To find out if your condition is acute or chronic, your health care provider may:  Look in your nose for signs of nasal polyps.  Tap over the affected sinus to check for signs of infection.  View the inside of your sinuses using an imaging device that has a light attached (endoscope). If your health care provider suspects that you have chronic sinusitis, you may also:  Be tested for allergies.  Have a sample of mucus taken from your nose (nasal culture) and checked for bacteria.  Have a mucus sample examined to see if your sinusitis is related to an allergy. If your sinusitis does not respond to treatment and it lasts longer than 8 weeks, you may have an MRI or CT scan to check your sinuses. These scans also help to determine how severe your infection is. In rare cases, a bone biopsy may be done to rule out more serious types of fungal sinus disease. How is this treated? Treatment for sinusitis depends on the cause and whether your condition is chronic or acute. If a virus is causing your sinusitis, your symptoms will go away on their own within 10 days. You may be given medicines to relieve your symptoms, including:  Topical nasal decongestants (Flonase). They shrink swollen nasal passages and let mucus drain from your sinuses.  Antihistamines. These drugs block inflammation that is triggered by allergies. This can help to ease  swelling in your nose and sinuses.  Topical nasal corticosteroids. These are nasal sprays that ease inflammation and swelling in your nose and sinuses.  Nasal saline washes. These rinses can help to get rid of thick mucus in your nose. If your condition is caused by bacteria, you will be given an antibiotic medicine. If your condition is caused by a fungus, you will be given an antifungal medicine. Surgery may be needed to correct underlying conditions, such as narrow nasal passages. Surgery may also be needed to remove polyps. Follow these instructions at home: Medicines   Take, use, or apply over-the-counter and prescription medicines only as told by your health care provider. These may include nasal sprays.  If you were prescribed an antibiotic medicine, take it as told by your health care provider. Do not stop taking the antibiotic even if you start to feel better. Hydrate and Humidify   Drink enough water to keep your urine clear or pale yellow. Staying hydrated will help to thin your mucus.  Use a cool mist humidifier to keep the humidity level in your home  above 50%.  Inhale steam for 10-15 minutes, 3-4 times a day or as told by your health care provider. You can do this in the bathroom while a hot shower is running.  Limit your exposure to cool or dry air. Rest   Rest as much as possible.  Sleep with your head raised (elevated).  Make sure to get enough sleep each night. General instructions   Apply a warm, moist washcloth to your face 3-4 times a day or as told by your health care provider. This will help with discomfort.  Wash your hands often with soap and water to reduce your exposure to viruses and other germs. If soap and water are not available, use hand sanitizer.  Do not smoke. Avoid being around people who are smoking (secondhand smoke).  Keep all follow-up visits as told by your health care provider. This is important. Contact a health care provider if:  You  have a fever.  Your symptoms get worse.  Your symptoms do not improve within 10 days. Get help right away if:  You have a severe headache.  You have persistent vomiting.  You have pain or swelling around your face or eyes.  You have vision problems.  You develop confusion.  Your neck is stiff.  You have trouble breathing. This information is not intended to replace advice given to you by your health care provider. Make sure you discuss any questions you have with your health care provider. Document Released: 09/23/2005 Document Revised: 05/19/2016 Document Reviewed: 07/19/2015 Elsevier Interactive Patient Education  2017 [employer].

## 2017-01-02 NOTE — Progress Notes (Signed)
Subjective:    Patient ID: Yvette [name], female    DOB: 08/11/1946, 71 y.o.   MRN: [id number]  Yvette [name] is a 71 y.o. female presenting on 01/02/2017 for Sinusitis   HPI   Yvette [name] presents today with sinus pressure and congestion that has lasted 4 weeks, but worsened significantly yesterday.  She notes, "its the season" and usually has bad seasonal allergies.  She has taken OTC "sinus medicine" and it seems to help at first, then "BAM!"  SHe is having more pressure on R maxillary sinus with some R and L frontal sinus pressure.  Her other associated symptoms include R upper teeth soreness, fever, night sweats, sweat daytime last Thursday in the grocery store.   Medicines she has taken include:  OTC tylenol severe sinus and claritin some days of the week.    Social History  Substance Use Topics  . Smoking status: Former Smoker    Packs/day: 1.00    Years: 26.00    Types: Cigarettes    Quit date: 06/07/1992  . Smokeless tobacco: Never Used  . Alcohol use 0.0 oz/week     Comment: occassional    Review of Systems  Eyes:       Tearing  Respiratory: Negative.   Cardiovascular: Negative.   Gastrointestinal: Negative.   Endocrine: Negative.   Skin: Negative.   Neurological: Negative.    Per HPI unless specifically indicated above     Objective:    BP (!) 166/74 (BP Location: Left Arm, Patient Position: Sitting, Cuff Size: Large)   Pulse 70   Temp 98 F (36.7 C) (Oral)   Resp 16   Ht 5\' 7"  (1.702 m)   Wt 232 lb (105.2 kg)   BMI 36.34 kg/m   Wt Readings from Last 3 Encounters:  01/02/17 232 lb (105.2 kg)  11/07/16 227 lb (103 kg)  10/14/16 224 lb (101.6 kg)    Physical Exam  Constitutional: She is oriented to person, place, and time. She appears well-developed and well-nourished.  HENT:  Head: Normocephalic and atraumatic.  Right Ear: External ear normal.  Left Ear: Tympanic membrane, external ear and ear canal normal.  Nose: Mucosal edema and rhinorrhea  present. Right sinus exhibits maxillary sinus tenderness and frontal sinus tenderness. Left sinus exhibits frontal sinus tenderness. Left sinus exhibits no maxillary sinus tenderness.  Mouth/Throat: Uvula is midline and mucous membranes are normal. Posterior oropharyngeal edema present. No oropharyngeal exudate, posterior oropharyngeal erythema or tonsillar abscesses.  R ear cerumen obstructs view  Eyes: Right eye exhibits discharge. Left eye exhibits no discharge. No scleral icterus.  Right eye tearing  Neck: Normal range of motion. Neck supple.  Cardiovascular: Normal rate, regular rhythm, normal heart sounds and intact distal pulses.   Pulmonary/Chest: Effort normal and breath sounds normal.  Lymphadenopathy:    She has no cervical adenopathy.  Neurological: She is alert and oriented to person, place, and time.  Skin: Skin is warm and dry.  Psychiatric: She has a normal mood and affect. Her behavior is normal. Judgment and thought content normal.      Assessment & Plan:   Problem List Items Addressed This Visit    None    Visit Diagnoses    Acute bacterial sinusitis    -  Primary 1. Take augmentin 875-125 mg every 12 hours for 10 days.  Take all doses. 2. Use flonase twice per day for up to 4-6 weeks.  It may take 2 weeks for full effect. 3. Take  claritin daily for the next 4-6 weeks during patient's allergy season. 4. Can use OTC mucinex for 7-10 days if needed for congestion.   5. Discussed the importance of not using decongestants in OTC meds r/t elevated BP. 6. Encouraged fluids. 7. Can take 650 mg Tylenol every 6-8 hours for aches, fever, chills, sweats. 8. Return if symptoms worsen or fail to improve in 5-7 days.   Relevant Medications   amoxicillin-clavulanate (AUGMENTIN) 875-125 MG tablet   fluticasone (FLONASE) 50 MCG/ACT nasal spray   Cerumen debris on tympanic membrane of right ear 1. Use over the counter Debrox for cerumen softening.  Wipe away from external ear with  tissue or washcloth.  Do not use q-tips.  Acute seasonal allergic rhinitis due to pollen       Relevant Medications   fluticasone (FLONASE) 50 MCG/ACT nasal spray      Meds ordered this encounter  Medications  . amoxicillin-clavulanate (AUGMENTIN) 875-125 MG tablet    Sig: Take 1 tablet by mouth every 12 (twelve) hours.    Dispense:  20 tablet    Refill:  0    Order Specific Question:   Supervising Provider    Answer:   Olin Hauser [[id number]]  . fluticasone (FLONASE) 50 MCG/ACT nasal spray    Sig: Place 2 sprays into both nostrils daily.    Dispense:  16 g    Refill:  1    Order Specific Question:   Supervising Provider    Answer:   Olin Hauser [[id number]]      Follow up plan: Return in 5-7 days if symptoms worsen or fail to improve.   Cassell Smiles, DNP, AGNP-BC Adult Gerontology Nurse Practitioner [facility] 01/02/2017, 5:56 PM

## 2017-01-02 NOTE — Progress Notes (Signed)
I have reviewed this encounter including the documentation in this note and/or discussed this patient with the provider, Cassell Smiles, AGPCNP-BC. I am certifying that I agree with the content of this note as supervising physician.  Nobie Putnam, [facility] 01/02/2017, 6:25 PM

## 2017-02-12 ENCOUNTER — Encounter: Payer: Self-pay | Admitting: Family Medicine

## 2017-02-12 ENCOUNTER — Ambulatory Visit (INDEPENDENT_AMBULATORY_CARE_PROVIDER_SITE_OTHER): Payer: Medicare Other | Admitting: Family Medicine

## 2017-02-12 VITALS — BP 130/68 | HR 72 | Temp 97.7°F | Resp 16 | Ht 67.0 in | Wt 228.0 lb

## 2017-02-12 DIAGNOSIS — E669 Obesity, unspecified: Secondary | ICD-10-CM | POA: Diagnosis not present

## 2017-02-12 DIAGNOSIS — J3089 Other allergic rhinitis: Secondary | ICD-10-CM | POA: Diagnosis not present

## 2017-02-12 DIAGNOSIS — R7303 Prediabetes: Secondary | ICD-10-CM

## 2017-02-12 DIAGNOSIS — J309 Allergic rhinitis, unspecified: Secondary | ICD-10-CM | POA: Insufficient documentation

## 2017-02-12 DIAGNOSIS — I10 Essential (primary) hypertension: Secondary | ICD-10-CM

## 2017-02-12 MED ORDER — IPRATROPIUM BROMIDE 0.06 % NA SOLN: 2.0000 | Freq: Four times a day (QID) | NASAL | 0 refills | Status: DC

## 2017-02-12 NOTE — Assessment & Plan Note (Signed)
HTN currently controlled on regimen. Improved after recent switch Lisinopril 40 AM to PM dosing. No complication  Plan: 1. Continue meds - Lisinopril 40mg  PM, Amlodipine 5mg  daily, HCTZ 25mg  daily 2. Encouraged to continue adjusting diet plan to more regular diet, reviewed low sodium diet, start more regular exercise 3. Monitor BP at home, follow-up sooner if elevated 4. Follow-up 3 months HTN

## 2017-02-12 NOTE — Patient Instructions (Signed)
Thank you for coming to the clinic today.  1. Keep up the good work! - [employer] job on 4 lb weight loss in 1 month - Work on [employer] as you are and regular exercise  Referral for diabetic education / nutritionist, if you do not hear about apt in 1-2 weeks call them to check status  [facility] Address: [address], [place], [place] [address] Phone: [phone]  Continue current blood pressure regimen, no changes  For allergies and sinuses, improving - wax in R ear is resolving, keep using ear drops - start Atrovent nasal spray decongestant 2 sprays in each nostril up to 4 times daily for 7 days  You will be due for NON FASTING BLOOD WORK (no food or drink after midnight before, only water or coffee without cream/sugar on the morning of)  - Please go ahead and schedule a "Lab Only" visit in the morning at the clinic for lab draw in 1 month  For Lab Results, once available within 2-3 days of blood draw, you can can log in to MyChart online to view your results and a brief explanation. Also, we can discuss results at next follow-up visit.  Please schedule a follow-up appointment with Dr. Parks Ranger in 4 months for Pre-Diabetes A1c, HTN, Weight  If you have any other questions or concerns, please feel free to call the clinic or send a message through [place]. You may also schedule an earlier appointment if necessary.  Nobie Putnam, DO [place]

## 2017-02-12 NOTE — Assessment & Plan Note (Signed)
Secondary to allergies, recently related to sinusitis, now improved, but has lingering rhinitis symptoms. - Start using Flonase DAILY, not PRN - Conitnue Loratadine - Start Atrovent nasal spray decongestant 2 sprays in each nostril up to 4 times daily for 7 days - for PRN use congestion only

## 2017-02-12 NOTE — Assessment & Plan Note (Addendum)
Continue Loratadine, Flonase regularly, need to start before allergy season next year

## 2017-02-12 NOTE — Assessment & Plan Note (Signed)
Weight stable to improving, had prior wt gain in 12/2016, now improving stress eating. Concern with Pre-DM and HTN. - Seems to have very erratic eating patterns, sometimes only 1 meal daily, skips breakfast  Plan: 1. Reviewed healthy balanced low carb diet, importance of regular meals vs snacks avoid >5 hours not eating, not skip breakfast, improve hydration 2. Referral to [facility] for diabetic education / nutrition, as pre-diabetic, obesity, HTN 3. Follow-up 3 months

## 2017-02-12 NOTE — Assessment & Plan Note (Signed)
Stable controlled Pre-DM A1c < 6.0, has been 5.5 to 5.8 recently See A&P obesity for diet management - Referral to [facility] lifestyle center for DM education - Check future A1c lab draw in 1 month (6 months after last) - Follow-up in 4 months

## 2017-02-12 NOTE — Progress Notes (Signed)
Subjective:    Patient ID: Yvette [name], female    DOB: 1946/02/10, 71 y.o.   MRN: [id number]  Yvette [name] is a 71 y.o. female presenting on 02/12/2017 for Hypertension (Patient here today to fu from 10/14/2016. Patient reports feeling fairly well. Patient would like to have her ears checked. Patient reports that on 01/02/17 she was advised to use ear drops for wax build up.)   HPI   CHRONIC HTN / OBESITY BMI >35 Reports last visit 10/2016, her PCP Dr Luan Pulling switched timing of her Lisinopril from AM to PM dosing. Current Meds - Lisinopril 40mg  PM (switched from AM), Amlodipine 5mg  daily, HCTZ 25mg  daily  Reports good compliance, took meds today. Tolerating well, w/o complaints. Lifestyle: - Had abnormal weight gain, with stress eating after losing husband 06/2015, she has worked on reducing portion size and often when eating out she will take food home and states she feels full and satisfied with smaller portions, she has lost about 4 lbs in >1 month since last visit. Sometimes only eats 1 meal daily, often skip breakfast and sometimes dinner. Often eats largest meal of day 2-3pm Denies CP, dyspnea, HA, edema, dizziness / lightheadedness  Pre-Diabetes: Reports no concerns. Last A1c 5.8 CBGs: rarely checks CBG - 112-136 Meds: none Currently on ACEi, ASA 81, statin Lifestyle: see above Denies hypoglycemia, polyuria, visual changes, numbness or tingling.  Environmental and Seasonal Allergies / Follow-up recent Sinusitis: - She was seen on 01/02/17 for acute bacterial sinusitis, treated with Augmentin with resolution - Today follow-up for ears, wanted to check R ear. Was told had ear wax and given ear drops last time. - Taking OTC Loratadine 10mg  daily, also Flonase but not taking nasal spray regularly using PRN - Used OTC Debrox drops - Denies fevers/chills, sinus pain or pressure, purulence, ear pain or loss of hearing   Social History  Substance Use Topics  . Smoking status:  Former Smoker    Packs/day: 1.00    Years: 26.00    Types: Cigarettes    Quit date: 06/07/1992  . Smokeless tobacco: Never Used  . Alcohol use 0.0 oz/week     Comment: occassional    Review of Systems Per HPI unless specifically indicated above     Objective:    BP 130/68 (BP Location: Left Arm, Cuff Size: Large)   Pulse 72   Temp 97.7 F (36.5 C) (Oral)   Resp 16   Ht 5\' 7"  (1.702 m)   Wt 228 lb (103.4 kg)   SpO2 99%   BMI 35.71 kg/m   Wt Readings from Last 3 Encounters:  02/12/17 228 lb (103.4 kg)  01/02/17 232 lb (105.2 kg)  11/07/16 227 lb (103 kg)    Physical Exam  Constitutional: She is oriented to person, place, and time. She appears well-developed and well-nourished. No distress.  Well-appearing, comfortable, cooperative  HENT:  Head: Normocephalic and atraumatic.  Mouth/Throat: Oropharynx is clear and moist.  Resolved frontal / maxillary sinuses non-tender. Nares mostly patent some deeper turbinate edema without purulence.   R ear canal has small amount of soft cerumen, non impacted. R TM normal appearance with minimal resolving clear effusion and fullness without bulging or erythema. L ear canal is clear w/o cerumen. TM normal as above.  Oropharynx clear without erythema, exudates, edema or asymmetry.  Eyes: Conjunctivae are normal. Right eye exhibits no discharge. Left eye exhibits no discharge.  Neck: Normal range of motion. Neck supple. No thyromegaly present.  Cardiovascular: Normal  rate, regular rhythm, normal heart sounds and intact distal pulses.   No murmur heard. Pulmonary/Chest: Effort normal and breath sounds normal. No respiratory distress. She has no wheezes. She has no rales.  Musculoskeletal: She exhibits no edema.  Lymphadenopathy:    She has no cervical adenopathy.  Neurological: She is alert and oriented to person, place, and time.  Skin: Skin is warm and dry. She is not diaphoretic.  Psychiatric: She has a normal mood and affect. Her  behavior is normal.  Nursing note and vitals reviewed.  Results for orders placed or performed in visit on 10/16/16  Hemoglobin A1c  Result Value Ref Range   Hgb A1c MFr Bld 5.8 (H) <5.7 %   Mean Plasma Glucose 120 mg/dL      Assessment & Plan:   Problem List Items Addressed This Visit    Pre-diabetes    Stable controlled Pre-DM A1c < 6.0, has been 5.5 to 5.8 recently See A&P obesity for diet management - Referral to [facility] lifestyle center for DM education - Check future A1c lab draw in 1 month (6 months after last) - Follow-up in 4 months      Relevant Orders   Hemoglobin A1c   Ambulatory referral to diabetic education   Obesity (BMI 35.0-39.9 without comorbidity)    Weight stable to improving, had prior wt gain in 12/2016, now improving stress eating. Concern with Pre-DM and HTN. - Seems to have very erratic eating patterns, sometimes only 1 meal daily, skips breakfast  Plan: 1. Reviewed healthy balanced low carb diet, importance of regular meals vs snacks avoid >5 hours not eating, not skip breakfast, improve hydration 2. Referral to [facility] for diabetic education / nutrition, as pre-diabetic, obesity, HTN 3. Follow-up 3 months      Relevant Orders   Ambulatory referral to diabetic education   Essential hypertension - Primary    HTN currently controlled on regimen. Improved after recent switch Lisinopril 40 AM to PM dosing. No complication  Plan: 1. Continue meds - Lisinopril 40mg  PM, Amlodipine 5mg  daily, HCTZ 25mg  daily 2. Encouraged to continue adjusting diet plan to more regular diet, reviewed low sodium diet, start more regular exercise 3. Monitor BP at home, follow-up sooner if elevated 4. Follow-up 3 months HTN      Relevant Orders   Ambulatory referral to diabetic education   Environmental and seasonal allergies    Continue Loratadine, Flonase regularly, need to start before allergy season next year      Allergic rhinosinusitis    Secondary  to allergies, recently related to sinusitis, now improved, but has lingering rhinitis symptoms. - Start using Flonase DAILY, not PRN - Conitnue Loratadine - Start Atrovent nasal spray decongestant 2 sprays in each nostril up to 4 times daily for 7 days - for PRN use congestion only      Relevant Medications   ipratropium (ATROVENT) 0.06 % nasal spray      Meds ordered this encounter  Medications  . loratadine (CLARITIN) 10 MG tablet    Sig: Take 10 mg by mouth daily.  Marland Kitchen ipratropium (ATROVENT) 0.06 % nasal spray    Sig: Place 2 sprays into both nostrils 4 (four) times daily. Use as needed. For up to 5-7 days then stop.    Dispense:  15 mL    Refill:  0    Follow up plan: Return in about 4 months (around 06/15/2017) for Pre-Diabetes A1c, HTN, Weight.  Nobie Putnam, [facility] 02/12/2017, 11:14 PM

## 2017-03-17 ENCOUNTER — Other Ambulatory Visit: Payer: Medicare Other

## 2017-03-17 ENCOUNTER — Other Ambulatory Visit: Payer: Self-pay | Admitting: Family Medicine

## 2017-03-18 LAB — HEMOGLOBIN A1C
Hgb A1c MFr Bld: 5.7 % — ABNORMAL HIGH (ref <5.7)
Mean Plasma Glucose: 117 mg/dL

## 2017-05-15 ENCOUNTER — Ambulatory Visit: Payer: Medicare Other | Admitting: Family Medicine

## 2017-06-10 ENCOUNTER — Ambulatory Visit (INDEPENDENT_AMBULATORY_CARE_PROVIDER_SITE_OTHER): Payer: Medicare Other | Admitting: Family Medicine

## 2017-06-10 ENCOUNTER — Encounter: Payer: Self-pay | Admitting: Family Medicine

## 2017-06-10 VITALS — BP 121/65 | HR 81 | Temp 98.5°F | Resp 16 | Ht 67.0 in | Wt 226.0 lb

## 2017-06-10 DIAGNOSIS — M159 Polyosteoarthritis, unspecified: Secondary | ICD-10-CM

## 2017-06-10 DIAGNOSIS — M150 Primary generalized (osteo)arthritis: Secondary | ICD-10-CM | POA: Diagnosis not present

## 2017-06-10 DIAGNOSIS — G8929 Other chronic pain: Secondary | ICD-10-CM | POA: Diagnosis not present

## 2017-06-10 DIAGNOSIS — F419 Anxiety disorder, unspecified: Secondary | ICD-10-CM

## 2017-06-10 DIAGNOSIS — M5442 Lumbago with sciatica, left side: Secondary | ICD-10-CM | POA: Diagnosis not present

## 2017-06-10 DIAGNOSIS — M5440 Lumbago with sciatica, unspecified side: Secondary | ICD-10-CM | POA: Insufficient documentation

## 2017-06-10 DIAGNOSIS — M8949 Other hypertrophic osteoarthropathy, multiple sites: Secondary | ICD-10-CM | POA: Insufficient documentation

## 2017-06-10 DIAGNOSIS — R110 Nausea: Secondary | ICD-10-CM | POA: Diagnosis not present

## 2017-06-10 DIAGNOSIS — M47816 Spondylosis without myelopathy or radiculopathy, lumbar region: Secondary | ICD-10-CM | POA: Diagnosis not present

## 2017-06-10 DIAGNOSIS — H8111 Benign paroxysmal vertigo, right ear: Secondary | ICD-10-CM

## 2017-06-10 MED ORDER — SERTRALINE HCL 50 MG PO TABS: 50.0000 mg | ORAL_TABLET | Freq: Every day | ORAL | 11 refills | Status: DC

## 2017-06-10 NOTE — Patient Instructions (Addendum)
Thank you for coming to the clinic today.  1. You have symptoms of Vertigo (Benign Paroxysmal Positional Vertigo) - This is commonly caused by inner ear fluid imbalance, sometimes can be worsened by allergies and sinus symptoms, otherwise it can occur randomly sometimes and we may never discover the exact cause. - To treat this, try the Epley Manuever (see diagrams/instructions below) at home up to 3 times a day for 1-2 weeks or until symptoms resolve - You may take Meclizine as needed up to 3 times a day for dizziness, this will not cure symptoms but may help. Caution may make you drowsy.  If you develop significant worsening episode with vertigo that does not improve and you get severe headache, loss of vision, arm or leg weakness, slurred speech, or other concerning symptoms please seek immediate medical attention at Emergency Department.  Please schedule a follow-up appointment with Dr Parks Ranger within 4 weeks if Vertigo not improving, and will consider Referral to Vestibular Rehab  For Sertraline 50mg  - new refill sent, 30 day supply +11 refills - 2 weeks before next apt, start cutting tablet in HALF take half pill 25mg  once daily until you see me.  For Low Back Pain  Recommend to start taking Tylenol Extra Strength 500mg  tabs - take 1 to 2 tabs per dose (max 1000mg ) every 6-8 hours (THREE TIMES DAILY) for pain (take regularly, don't skip a dose for next 7 days), max 24 hour daily dose is 6 tablets or 3000mg . In the future you can repeat the same everyday Tylenol course for 1-2 weeks at a time.   SAFE to take with anti-inflammatories  Recommend trial of Anti-inflammatory with OTC Aleve / Naproxen - 220 or 250 mg tabs - take ONE TO TWO with food and plenty of water TWICE daily every day (breakfast and dinner), for next 2 to 4 weeks, then you may take only as needed - DO NOT TAKE any ibuprofen, advil, motrin while you are taking this medicine  Use heating pad and muscle rub as  needed  -----------------------------------------------------------------  See the next page for images describing the Epley Manuever.     ----------------------------------------------------------------------------------------------------------------------       Please schedule a Follow-up Appointment to: Return in about 4 weeks (around 07/08/2017) for Pre-DM A1c, HTN, Anxiety GAD med, Back Pain, Vertigo.  If you have any other questions or concerns, please feel free to call the clinic or send a message through [place]. You may also schedule an earlier appointment if necessary.  Additionally, you may be receiving a survey about your experience at our clinic within a few days to 1 week by e-mail or mail. We value your feedback.  Nobie Putnam, DO [place]

## 2017-06-10 NOTE — Progress Notes (Signed)
Subjective:    Patient ID: Yvette [name], female    DOB: 10/03/1946, 71 y.o.   MRN: [id number]  Yvette [name] is a 71 y.o. female presenting on 06/10/2017 for Dizziness (bodyache joint pain exhausted fuzzy HA onset 2 and half weeks ago lower back pain and stiffness neck onset week as per pt thinks could be because she hasn't use sertrsline month but now start taking from past 3 days she was on vacation and forgot to take sertraline with her.) and Nausea  Patient presents for a same day appointment.  HPI   Vertigo Reports symptoms started with dizziness and room spinning provoked by laying on side (sleeps on side) also with positional changes and reaching above her head to hang curtains or bending forward. Worse on R side. - History of Vertigo in past >5 years ago - Previously at [facility], did an epley maneuver adjustment - Denies fever/chills, HA nausea vomiting  Left Low Back Pain - Reports concern with intermittent episodes of pain and paresthesias - OTC Aleve 220mg  once daily - Not taking Tylenol - Denies fall or injury  Nausea / Vomiting - Reports progressively worse intermittent episodes 1x daily, brief spell < 30 min usually, without vomiting - Increased belching, Pantoprazole 40mg  BID - never had EGD - Denies abdominal pain, active nausea vomiting  PMH Anxiety/Depression - She was previously on Sertraline 50mg , however went on vacation 8/11 and forgot Sertraline medicine, off it for about 3 weeks, now back on   Social History  Substance Use Topics  . Smoking status: Former Smoker    Packs/day: 1.00    Years: 26.00    Types: Cigarettes    Quit date: 06/07/1992  . Smokeless tobacco: Never Used  . Alcohol use 0.0 oz/week     Comment: occassional    Review of Systems Per HPI unless specifically indicated above     Objective:    BP 121/65   Pulse 81   Temp 98.5 F (36.9 C) (Oral)   Resp 16   Ht 5\' 7"  (1.702 m)   Wt 226 lb (102.5 kg)   BMI 35.40 kg/m     Wt Readings from Last 3 Encounters:  06/10/17 226 lb (102.5 kg)  02/12/17 228 lb (103.4 kg)  01/02/17 232 lb (105.2 kg)    Physical Exam  Constitutional: She is oriented to person, place, and time. She appears well-developed and well-nourished. No distress.  Well-appearing, comfortable, cooperative  HENT:  Head: Normocephalic and atraumatic.  Mouth/Throat: Oropharynx is clear and moist.  Declined dix-hallpike maneuver  Eyes: Conjunctivae are normal. Right eye exhibits no discharge. Left eye exhibits no discharge.  Neck: Normal range of motion. Neck supple. No thyromegaly present.  Cardiovascular: Normal rate, regular rhythm, normal heart sounds and intact distal pulses.   No murmur heard. Pulmonary/Chest: Effort normal and breath sounds normal. No respiratory distress. She has no wheezes. She has no rales.  Musculoskeletal: Normal range of motion. She exhibits no edema.  Lymphadenopathy:    She has no cervical adenopathy.  Neurological: She is alert and oriented to person, place, and time.  Skin: Skin is warm and dry. No rash noted. She is not diaphoretic. No erythema.  Psychiatric: She has a normal mood and affect. Her behavior is normal.  Well groomed, good eye contact, normal speech and thoughts  Nursing note and vitals reviewed.  Results for orders placed or performed in visit on 03/17/17  Hemoglobin A1c  Result Value Ref Range   Hgb  A1c MFr Bld 5.7 (H) <5.7 %   Mean Plasma Glucose 117 mg/dL      Assessment & Plan:   Problem List Items Addressed This Visit    Spondylosis of lumbar region without myelopathy or radiculopathy   Primary osteoarthritis involving multiple joints - Primary   Nausea without vomiting   Chronic left-sided low back pain with left-sided sciatica   Relevant Medications   sertraline (ZOLOFT) 50 MG tablet   BPPV (benign paroxysmal positional vertigo), right  Suspected R > L-BPPV by history - No other significant neurological findings or focal  deficits  Plan: 1. Handout given with Epley maneuver TID for 1-2 weeks until resolved 2. Rx meclizine PRN for breakthrough symptoms 3. Return criteria, if not improved consider vestibular PT referral     Anxiety disorder - Currently stable, refilled SSRI as requested - Continue current Zoloft 50mg  daily    Relevant Medications   sertraline (ZOLOFT) 50 MG tablet      Meds ordered this encounter  Medications  . sertraline (ZOLOFT) 50 MG tablet    Sig: Take 1 tablet (50 mg total) by mouth daily.    Dispense:  30 tablet    Refill:  11      Follow up plan: Return in about 4 weeks (around 07/08/2017) for Pre-DM A1c, HTN, Anxiety GAD med, Back Pain, Vertigo.  Nobie Putnam, [facility] 06/11/2017, 10:33 PM

## 2017-06-18 ENCOUNTER — Ambulatory Visit: Payer: Medicare Other | Admitting: Family Medicine

## 2017-07-08 ENCOUNTER — Encounter: Payer: Self-pay | Admitting: Family Medicine

## 2017-07-08 ENCOUNTER — Ambulatory Visit (INDEPENDENT_AMBULATORY_CARE_PROVIDER_SITE_OTHER): Payer: Medicare Other | Admitting: Family Medicine

## 2017-07-08 VITALS — BP 157/80 | HR 86 | Temp 98.7°F | Resp 16 | Ht 67.0 in | Wt 229.6 lb

## 2017-07-08 DIAGNOSIS — M159 Polyosteoarthritis, unspecified: Secondary | ICD-10-CM

## 2017-07-08 DIAGNOSIS — Z23 Encounter for immunization: Secondary | ICD-10-CM | POA: Diagnosis not present

## 2017-07-08 DIAGNOSIS — M8949 Other hypertrophic osteoarthropathy, multiple sites: Secondary | ICD-10-CM

## 2017-07-08 DIAGNOSIS — I10 Essential (primary) hypertension: Secondary | ICD-10-CM

## 2017-07-08 DIAGNOSIS — M150 Primary generalized (osteo)arthritis: Secondary | ICD-10-CM

## 2017-07-08 DIAGNOSIS — R7303 Prediabetes: Secondary | ICD-10-CM | POA: Diagnosis not present

## 2017-07-08 LAB — POCT GLYCOSYLATED HEMOGLOBIN (HGB A1C): Hemoglobin A1C: 6 — AB (ref ?–5.7)

## 2017-07-08 MED ORDER — ZOSTER VAC RECOMB ADJUVANTED 50 MCG/0.5ML IM SUSR: INTRAMUSCULAR | 1 refills | Status: DC

## 2017-07-08 NOTE — Progress Notes (Signed)
Subjective:    Patient ID: Yvette [name], female    DOB: July 15, 1946, 71 y.o.   MRN: [id number]  Yvette [name] is a 71 y.o. female presenting on 07/08/2017 for Hypertension and Diabetes   HPI   Pre-Diabetes: Reports concern with mild inc sweet and sphagetti intake - reduced exercise over past 1 month, sedentary with hip and foot and knee pain, limiting her CBGs: rarely checks CBG Meds: none Currently on ACEi, ASA 81, statin Lifestyle: - Diet: Poor food choices recently, carbs, sweets, not as well hydrated - Exercise: Limited due to arthritis Denies hypoglycemia, polyuria, visual changes, numbness or tingling.  Osteoarthritis, multiple joints Lumbar Spine, Knees (L>R), Hips - Since last visit 06/10/17 has been taking Tylenol OTC more regularly Extra Str 500mg  often daily but sometimes only 1-2 tabs daily. Some notable relief, also taking Naprosyn once daily as well and sometimes BID. - Describes recent mild flare L knee pain worse after yard work but had been doing better - Limited exercise and walking, worse prolonged standing. Has not tried gym swimming pool but will consider this  CHRONIC HTN: Reports not checking BP, did not take meds today, forgot Current Meds - Amlodipine 5mg  daily, Lisinopril 40mg  daily, HCTZ 25mg  daily   Reports normally good compliance. Tolerating well, w/o complaints. Denies CP, dyspnea, HA, edema, dizziness / lightheadedness  Health Maintenance: - Due for Flu Shot, due today for High Dose Vaccine - May be due for 2nd pneumonia vaccine, will check prior record - she has received prior Prevnar-13 (12/2014) due for pneumovax23 today - Due for Shingrix vaccine, will get at pharmacy if covered, requesting rx today printed  Depression screen [facility] 2/9 07/08/2017 01/02/2017 10/14/2016  Decreased Interest 0 0 0  Down, Depressed, Hopeless 0 0 0  PHQ - 2 Score 0 0 0  Altered sleeping - - -  Tired, decreased energy - - -  Change in appetite - - -  Feeling bad or  failure about yourself  - - -  Trouble concentrating - - -  Moving slowly or fidgety/restless - - -  Suicidal thoughts - - -  PHQ-9 Score - - -  Difficult doing work/chores - - -   No flowsheet data found.   Social History  Substance Use Topics  . Smoking status: Former Smoker    Packs/day: 1.00    Years: 26.00    Types: Cigarettes    Quit date: 06/07/1992  . Smokeless tobacco: Never Used  . Alcohol use 0.0 oz/week     Comment: occassional    Review of Systems Per HPI unless specifically indicated above     Objective:    BP (!) 157/80   Pulse 86   Temp 98.7 F (37.1 C) (Oral)   Resp 16   Ht 5\' 7"  (1.702 m)   Wt 229 lb 9.6 oz (104.1 kg)   BMI 35.96 kg/m   Wt Readings from Last 3 Encounters:  07/08/17 229 lb 9.6 oz (104.1 kg)  06/10/17 226 lb (102.5 kg)  02/12/17 228 lb (103.4 kg)    Physical Exam  Constitutional: She is oriented to person, place, and time. She appears well-developed and well-nourished. No distress.  Well-appearing, comfortable, cooperative  HENT:  Head: Normocephalic and atraumatic.  Mouth/Throat: Oropharynx is clear and moist.  Eyes: Conjunctivae are normal. Right eye exhibits no discharge. Left eye exhibits no discharge.  Neck: Normal range of motion. Neck supple. No thyromegaly present.  Cardiovascular: Normal rate, regular rhythm, normal heart sounds  and intact distal pulses.   No murmur heard. Pulmonary/Chest: Effort normal and breath sounds normal. No respiratory distress. She has no wheezes. She has no rales.  Musculoskeletal: Normal range of motion. She exhibits no edema.  Bilateral Knees Inspection: Mild inc bulky appearance and symmetrical. No ecchymosis or effusion. Palpation: Mild +TTP Left knee only lateral joint line otherwise minimal today. Mild fine crepitus ROM: Full active ROM bilaterally Strength: 5/5 intact knee flex/ext, ankle dorsi/plantarflex Neurovascular: distally intact sensation light touch and pulses     Lymphadenopathy:    She has no cervical adenopathy.  Neurological: She is alert and oriented to person, place, and time.  Skin: Skin is warm and dry. No rash noted. She is not diaphoretic. No erythema.  Psychiatric: She has a normal mood and affect. Her behavior is normal.  Well groomed, good eye contact, normal speech and thoughts  Nursing note and vitals reviewed.     Recent Labs  10/16/16 0001 03/17/17 1014 07/08/17 1345  HGBA1C 5.8* 5.7* 6.0*    Results for orders placed or performed in visit on 07/08/17  POCT HgB A1C  Result Value Ref Range   Hemoglobin A1C 6.0 (A) 5.7      Assessment & Plan:   Problem List Items Addressed This Visit    Essential hypertension    Mildly elevated BP today, SBP >150, declines re-check since she did not take meds today Previously controlled No complications  Plan: 1. Continue meds - Lisinopril 40mg  PM, Amlodipine 5mg  daily, HCTZ 25mg  daily - can take Lisinopril/Amlodipine when gets home today then resume normal dosing tomorrow 2. Encouraged to continue adjusting diet plan to more regular diet, reviewed low sodium diet, start more regular exercise - try swimming at gym (silver sneakers) 3. Monitor BP at home, follow-up sooner if elevated 4. Follow-up 3 months HTN      Pre-diabetes - Primary    Mildly worsened control Pre DM A1c 6.0, previously 5.7 to 5.8, essentially still in appropriate range Concern with obesity, HTN, HLD  Plan:  1. Not on any therapy currently  2. Encourage improved lifestyle - low carb, low sugar diet, reduce portion size, continue improving regular exercise - try swimming at gym, silver sneakers 3. Follow-up q 3 month - consider low dose metformin in future      Relevant Orders   POCT HgB A1C (Completed)   Primary osteoarthritis involving multiple joints    Acute on chronic L generalized Knee pain without swelling known injury or trauma  - Known significant knee OA/DJD. Suspected likely due to underlying  osteoarthritis / DJD with known OA/DJD in other joints (hip, lumbar spine) - Able to bear weight, no knee instability, mechanical locking - No prior history of knee surgery, arthroscopy - Improved conservative therapy - Last X-rays 2014  Plan: 1. Reviewed continued plan for regular Tylenol dosing encourage Ext Str 500mg  1-2 TID most days and PRN Naproxen 250mg  OTC 1-2 tabs BID PRN flares  2. Recommend knee sleeve vs brace - RICE therapy (rest, ice, compression, elevation) for swelling, activity modification 3. Future may check repeat Knee X-rays 4. Follow-up as needed - can offer steroid injections, PT, Ortho       Relevant Medications   naproxen (NAPROSYN) 250 MG tablet    Other Visit Diagnoses    Needs flu shot       Relevant Orders   Flu vaccine HIGH DOSE PF (Completed)   Need for 23-polyvalent pneumococcal polysaccharide vaccine       Need  for shingles vaccine       Relevant Medications   Zoster Vac Recomb Adjuvanted [facility]) injection   Need for pneumococcal vaccine       Relevant Orders   Pneumococcal polysaccharide vaccine 23-valent greater than or equal to 2yo subcutaneous/IM (Completed)      Meds ordered this encounter  Medications  . ketoconazole (NIZORAL) 2 % cream  . naproxen (NAPROSYN) 250 MG tablet  . Zoster Vac Recomb Adjuvanted [facility]) injection    Sig: Inject into muscle 0.5mg  one dose and repeat within 6 months    Dispense:  0.5 mL    Refill:  1    Follow up plan: Return in about 3 months (around 10/08/2017) for Medicare Physical CPE.  Nobie Putnam, [facility] 07/09/2017, 6:39 AM

## 2017-07-08 NOTE — Patient Instructions (Addendum)
Thank you for coming to the clinic today.  1. Monitor BP at home, may take meds when get there today - If BP consistently >140/90 call office and notify  2. Try to work on improving nutrition, diet and exercise as tolerated, less carbs and sweets, and try water aerobics for knees  A1c 6.0, slightly elevated  3. Take more tylenol and less naprosyn as discussed  4. Flu Shot today  5. Will check record for old pneumonia vaccine and if need new one or if you are done  6. Will check for Hepatitis C screen test, if need we can repeat in future  7. Written shingrix shingles vaccine, need 2 doses in 6 months from pharmacy, check cost  8. May increase Sertraline from 50mg  daily to 1.5 pills (one and half) to see if this helps sleep more, follow-up if not improving  DUE for FASTING BLOOD WORK (no food or drink after midnight before the lab appointment, only water or coffee without cream/sugar on the morning of)  SCHEDULE "Lab Only" visit in the morning at the clinic for lab draw in 3 MONTHS  - Make sure Lab Only appointment is at about 1 week before your next appointment, so that results will be available  For Lab Results, once available within 2-3 days of blood draw, you can can log in to MyChart online to view your results and a brief explanation. Also, we can discuss results at next follow-up visit.   Please schedule a Follow-up Appointment to: Return in about 3 months (around 10/08/2017) for Medicare Physical CPE.  If you have any other questions or concerns, please feel free to call the clinic or send a message through [place]. You may also schedule an earlier appointment if necessary.  Additionally, you may be receiving a survey about your experience at our clinic within a few days to 1 week by e-mail or mail. We value your feedback.  Nobie Putnam, DO [place]

## 2017-07-09 ENCOUNTER — Encounter: Payer: Self-pay | Admitting: Family Medicine

## 2017-07-09 NOTE — Assessment & Plan Note (Signed)
Mildly elevated BP today, SBP >150, declines re-check since she did not take meds today Previously controlled No complications  Plan: 1. Continue meds - Lisinopril 40mg  PM, Amlodipine 5mg  daily, HCTZ 25mg  daily - can take Lisinopril/Amlodipine when gets home today then resume normal dosing tomorrow 2. Encouraged to continue adjusting diet plan to more regular diet, reviewed low sodium diet, start more regular exercise - try swimming at gym (silver sneakers) 3. Monitor BP at home, follow-up sooner if elevated 4. Follow-up 3 months HTN

## 2017-07-09 NOTE — Assessment & Plan Note (Signed)
Acute on chronic L generalized Knee pain without swelling known injury or trauma  - Known significant knee OA/DJD. Suspected likely due to underlying osteoarthritis / DJD with known OA/DJD in other joints (hip, lumbar spine) - Able to bear weight, no knee instability, mechanical locking - No prior history of knee surgery, arthroscopy - Improved conservative therapy - Last X-rays 2014  Plan: 1. Reviewed continued plan for regular Tylenol dosing encourage Ext Str 500mg  1-2 TID most days and PRN Naproxen 250mg  OTC 1-2 tabs BID PRN flares  2. Recommend knee sleeve vs brace - RICE therapy (rest, ice, compression, elevation) for swelling, activity modification 3. Future may check repeat Knee X-rays 4. Follow-up as needed - can offer steroid injections, PT, Ortho

## 2017-07-09 NOTE — Assessment & Plan Note (Signed)
Mildly worsened control Pre DM A1c 6.0, previously 5.7 to 5.8, essentially still in appropriate range Concern with obesity, HTN, HLD  Plan:  1. Not on any therapy currently  2. Encourage improved lifestyle - low carb, low sugar diet, reduce portion size, continue improving regular exercise - try swimming at gym, silver sneakers 3. Follow-up q 3 month - consider low dose metformin in future

## 2017-07-15 ENCOUNTER — Other Ambulatory Visit: Payer: Self-pay | Admitting: Family Medicine

## 2017-07-15 DIAGNOSIS — I10 Essential (primary) hypertension: Secondary | ICD-10-CM

## 2017-07-15 DIAGNOSIS — Z79899 Other long term (current) drug therapy: Secondary | ICD-10-CM

## 2017-07-15 DIAGNOSIS — E669 Obesity, unspecified: Secondary | ICD-10-CM

## 2017-07-15 DIAGNOSIS — F419 Anxiety disorder, unspecified: Secondary | ICD-10-CM

## 2017-07-15 DIAGNOSIS — R7303 Prediabetes: Secondary | ICD-10-CM

## 2017-07-15 DIAGNOSIS — E7849 Other hyperlipidemia: Secondary | ICD-10-CM

## 2017-10-01 ENCOUNTER — Telehealth: Payer: Self-pay | Admitting: Family Medicine

## 2017-10-01 NOTE — Telephone Encounter (Signed)
Called pt to sched for AWV with Nurse Health Advisor.  C/b #  [phone] on Skype [email or web address] if you have questions ° °

## 2017-10-05 ENCOUNTER — Encounter: Payer: Self-pay | Admitting: Family Medicine

## 2017-10-06 NOTE — Telephone Encounter (Signed)
Can you re-schedule this patient as she requests? Blood can be drawn earlier at her preference, as long as within about 1 week of 2nd appointment.  Nobie Putnam, DO [facility] 10/06/2017, 10:20 AM

## 2017-10-09 ENCOUNTER — Other Ambulatory Visit: Payer: Medicare Other

## 2017-10-13 ENCOUNTER — Other Ambulatory Visit: Payer: Medicare HMO

## 2017-10-13 DIAGNOSIS — Z79899 Other long term (current) drug therapy: Secondary | ICD-10-CM | POA: Diagnosis not present

## 2017-10-13 DIAGNOSIS — R7303 Prediabetes: Secondary | ICD-10-CM | POA: Diagnosis not present

## 2017-10-13 DIAGNOSIS — E7849 Other hyperlipidemia: Secondary | ICD-10-CM | POA: Diagnosis not present

## 2017-10-13 DIAGNOSIS — I10 Essential (primary) hypertension: Secondary | ICD-10-CM

## 2017-10-13 DIAGNOSIS — E559 Vitamin D deficiency, unspecified: Secondary | ICD-10-CM | POA: Diagnosis not present

## 2017-10-14 LAB — CBC WITH DIFFERENTIAL/PLATELET
Basophils Absolute: 20 cells/uL (ref 0–200)
Basophils Relative: 0.4 %
Eosinophils Absolute: 80 cells/uL (ref 15–500)
Eosinophils Relative: 1.6 %
HCT: 40.1 % (ref 35.0–45.0)
Hemoglobin: 13.6 g/dL (ref 11.7–15.5)
Lymphs Abs: 890 cells/uL (ref 850–3900)
MCH: 29.7 pg (ref 27.0–33.0)
MCHC: 33.9 g/dL (ref 32.0–36.0)
MCV: 87.6 fL (ref 80.0–100.0)
MPV: 11.9 fL (ref 7.5–12.5)
Monocytes Relative: 9.6 %
Neutro Abs: 3530 cells/uL (ref 1500–7800)
Neutrophils Relative %: 70.6 %
Platelets: 189 10*3/uL (ref 140–400)
RBC: 4.58 10*6/uL (ref 3.80–5.10)
RDW: 13.5 % (ref 11.0–15.0)
Total Lymphocyte: 17.8 %
WBC mixed population: 480 cells/uL (ref 200–950)
WBC: 5 10*3/uL (ref 3.8–10.8)

## 2017-10-14 LAB — COMPLETE METABOLIC PANEL WITH GFR
AG Ratio: 1.7 (calc) (ref 1.0–2.5)
ALT: 19 U/L (ref 6–29)
AST: 25 U/L (ref 10–35)
Albumin: 4.2 g/dL (ref 3.6–5.1)
Alkaline phosphatase (APISO): 72 U/L (ref 33–130)
BUN: 12 mg/dL (ref 7–25)
CO2: 31 mmol/L (ref 20–32)
Calcium: 9.7 mg/dL (ref 8.6–10.4)
Chloride: 98 mmol/L (ref 98–110)
Creat: 0.69 mg/dL (ref 0.60–0.93)
GFR, Est African American: 100 mL/min/{1.73_m2} (ref >=60)
GFR, Est Non African American: 86 mL/min/{1.73_m2} (ref >=60)
Globulin: 2.5 g/dL (calc) (ref 1.9–3.7)
Glucose, Bld: 172 mg/dL — ABNORMAL HIGH (ref 65–99)
Potassium: 4 mmol/L (ref 3.5–5.3)
Sodium: 137 mmol/L (ref 135–146)
Total Bilirubin: 0.8 mg/dL (ref 0.2–1.2)
Total Protein: 6.7 g/dL (ref 6.1–8.1)

## 2017-10-14 LAB — LIPID PANEL
Cholesterol: 203 mg/dL — ABNORMAL HIGH (ref <200)
HDL: 53 mg/dL (ref >50)
LDL Cholesterol (Calc): 110 mg/dL (calc) — ABNORMAL HIGH
Non-HDL Cholesterol (Calc): 150 mg/dL (calc) — ABNORMAL HIGH (ref <130)
Total CHOL/HDL Ratio: 3.8 (calc) (ref <5.0)
Triglycerides: 280 mg/dL — ABNORMAL HIGH (ref <150)

## 2017-10-14 LAB — HEMOGLOBIN A1C
Hgb A1c MFr Bld: 6 % of total Hgb — ABNORMAL HIGH (ref <5.7)
Mean Plasma Glucose: 126 (calc)
eAG (mmol/L): 7 (calc)

## 2017-10-14 LAB — VITAMIN D 25 HYDROXY (VIT D DEFICIENCY, FRACTURES): Vit D, 25-Hydroxy: 19 ng/mL — ABNORMAL LOW (ref 30–100)

## 2017-10-15 ENCOUNTER — Ambulatory Visit (INDEPENDENT_AMBULATORY_CARE_PROVIDER_SITE_OTHER): Payer: Medicare HMO | Admitting: Family Medicine

## 2017-10-15 ENCOUNTER — Encounter: Payer: Self-pay | Admitting: Family Medicine

## 2017-10-15 ENCOUNTER — Ambulatory Visit
Admission: RE | Admit: 2017-10-15 | Discharge: 2017-10-15 | Disposition: A | Discharge status: Home / Self Care | Payer: Medicare HMO | Source: Ambulatory Visit | Attending: Family Medicine | Admitting: Family Medicine

## 2017-10-15 VITALS — BP 142/74 | HR 72 | Temp 97.8°F | Resp 16 | Ht 67.0 in | Wt 230.0 lb

## 2017-10-15 DIAGNOSIS — I10 Essential (primary) hypertension: Secondary | ICD-10-CM

## 2017-10-15 DIAGNOSIS — I739 Peripheral vascular disease, unspecified: Secondary | ICD-10-CM

## 2017-10-15 DIAGNOSIS — M47816 Spondylosis without myelopathy or radiculopathy, lumbar region: Secondary | ICD-10-CM

## 2017-10-15 DIAGNOSIS — E559 Vitamin D deficiency, unspecified: Secondary | ICD-10-CM | POA: Diagnosis not present

## 2017-10-15 DIAGNOSIS — R7303 Prediabetes: Secondary | ICD-10-CM

## 2017-10-15 DIAGNOSIS — M150 Primary generalized (osteo)arthritis: Secondary | ICD-10-CM

## 2017-10-15 DIAGNOSIS — Z0000 Encounter for general adult medical examination without abnormal findings: Secondary | ICD-10-CM

## 2017-10-15 DIAGNOSIS — E669 Obesity, unspecified: Secondary | ICD-10-CM

## 2017-10-15 DIAGNOSIS — M159 Polyosteoarthritis, unspecified: Secondary | ICD-10-CM

## 2017-10-15 DIAGNOSIS — M5136 Other intervertebral disc degeneration, lumbar region: Secondary | ICD-10-CM | POA: Insufficient documentation

## 2017-10-15 DIAGNOSIS — R222 Localized swelling, mass and lump, trunk: Secondary | ICD-10-CM | POA: Diagnosis not present

## 2017-10-15 DIAGNOSIS — M545 Low back pain: Secondary | ICD-10-CM | POA: Diagnosis not present

## 2017-10-15 DIAGNOSIS — M8949 Other hypertrophic osteoarthropathy, multiple sites: Secondary | ICD-10-CM

## 2017-10-15 MED ORDER — LISINOPRIL 40 MG PO TABS: 40.0000 mg | ORAL_TABLET | Freq: Every day | ORAL | 3 refills | Status: DC

## 2017-10-15 MED ORDER — AMLODIPINE BESYLATE 10 MG PO TABS: 10.0000 mg | ORAL_TABLET | Freq: Every day | ORAL | 5 refills | Status: DC

## 2017-10-15 MED ORDER — VITAMIN D3 125 MCG (5000 UT) PO CAPS: 5000.0000 [IU] | ORAL_CAPSULE | Freq: Every day | ORAL | 0 refills | Status: DC

## 2017-10-15 MED ORDER — HYDROCHLOROTHIAZIDE 12.5 MG PO TABS: 12.5000 mg | ORAL_TABLET | Freq: Every day | ORAL | 5 refills | Status: DC

## 2017-10-15 NOTE — Patient Instructions (Addendum)
Thank you for coming to the office today.   1.  Start OTC Vitamin D3 5,000 iu daily for 12 weeks then reduce to OTC Vitamin D3 2,000 iu daily for maintenance  2. Lower HCTZ from 25 to 12.5 INCREASE Amlodipine from 5 to 10  Goal to control BP and reduce urinary frequency  Limit caffeine  Time voided  Consider Urology referral Urology  3.  For Mammogram screening for breast cancer - for Right breast chest wall abnormality, and ultrasound, changed to diagnostic ultrasound  Call the [facility] below anytime to schedule your own appointment now that order has been placed.  [facility] [address] [place], [place] [address] Phone: [phone]  4. X-ray ordered for Lumbar Spine - stay tuned for results  Vascular Ultrsound ABI ordered - stay tuned for scheduling information  Please schedule a Follow-up Appointment to: Return in about 6 weeks (around 11/26/2017) for 3-6 weeks for Leg weakness, [place] results.  If you have any other questions or concerns, please feel free to call the office or send a message through [place]. You may also schedule an earlier appointment if necessary.  Additionally, you may be receiving a survey about your experience at our office within a few days to 1 week by e-mail or mail. We value your feedback.  Nobie Putnam, DO [place]

## 2017-10-15 NOTE — Assessment & Plan Note (Signed)
Likely contributing to subacute on chronic LBP Concern for possible claudication symptoms, consider neurogenic etiology See A&P

## 2017-10-15 NOTE — Assessment & Plan Note (Signed)
Mildly elevated BP today, repeat improved No complications  Plan: 1. ADJUST meds - due to urinary urgency vs frequency on thiazide - INCREASE Amlodipine from 5 to 10mg  - new rx sent - REDUCE HCTZ from 25 to 12.5mg  daily - new rx sent - Continue Lisinopril 40mg  daily, refilled 2. Encouraged to continue adjusting diet plan to more regular diet, reviewed low sodium diet, start more regular exercise - try swimming at gym (silver sneakers) 3. Monitor BP at home, follow-up sooner if elevated

## 2017-10-15 NOTE — Progress Notes (Signed)
Subjective:    Patient ID: Yvette [name], female    DOB: 18-Oct-1945, 72 y.o.   MRN: [id number]  Yvette [name] is a 72 y.o. female presenting on 10/15/2017 for Annual Exam and Hypertension   HPI   Here for Annual Physical and Lab Review  Pre-Diabetes: Reports concern with diet recently CBGs: rarely checks CBG Meds: none Currently on ACEi, ASA 81, statin Lifestyle: - Diet: Poor food choices recently, carbs, sweets, not as well hydrated - Exercise: Limited due to arthritis  HYPERLIPIDEMIA: - Reports concerns with cholesterol, prior history elevated. Last lipid panel 10/2017, abnormal elevated TG, but improved HDL - Currently taking Simvastatin 20mg , she does have some concern with potential side effect describes generalized muscle joint pain, seems to be continual, thinks this may be due to medicine - Taking Tylenol daily and taking occasional dose Naprosyn with PRN relief  CHRONIC HTN: Reports not checking BP at home, no readings Current Meds - Amlodipine 5mg  daily, Lisinopril 40mg  daily, HCTZ 25mg  daily   Reports normally good compliance. Tolerating well, w/o complaints. Admits urinary urgency, asking if can reduce HCTZ   Additional complaints today  Bilateral Leg Weakness vs Claudication Leg Pain / Subacute on Chronic LBP - Last visit with me 07/2017, for same problem, treated with conservative therapy Tylenol dosing RICE, see prior notes for background information. - Interval update with knee no longer hurt, but has had some persistent back pain. - Today patient reports taking Tylenol regularly, occasional Naprosyn trying to limit - Her main concern is low back pain, Left > R without injury, and associated bilateral leg weakness and pain. She describes reduced strength in both legs, seems to have reduced endurance, sometimes after walking short distance 30-50 ft will feel "heaviness in legs" and feels weaker, like has "cinder-blocks" on legs. Improves with rest - She is  active otherwise, does some group walking, difficulty keeping up anymore, now seems to be gradual progressive over past 12 months - History of cardiac work-up in 2014 for dyspnea chest pain, had normal stress and ECHO - Currently does not endorse exertional dyspnea or chest pain - LBP associated with ambulation - Admits occasional feet swelling and sometimes cold feet, no significant color change - Admits occasional tingling in feet, without actual numbness - See above she is concerned about statin side effect simvastatin  Nodule Right Chest wall - reported has had spot with recurrent swelling and tender 1-2 cm R upper chest, not on breast, below clavicle, tender at times, seems to come and go but never resolved - She was scheduled for routine screening mammogram, interested in other testing for this spot  Urinary Urgency - Reports increased urinary urgency when have to go more promptly, not frequency, and does not have incontinence. Tried kegel exercises, limited benefit. Not followed by Urology, never on meds for bladder  Health Maintenance: UTD Flu Vaccine UTD Pneumonia vaccine series UTD Cologuard 12/2016, good for 3 years Last mammogram UTD 09/2016, negative bi rads 1. Fam history of breast cancer in mother  Depression screen [facility] 2/9 10/15/2017 07/08/2017 01/02/2017  Decreased Interest 0 0 0  Down, Depressed, Hopeless 0 0 0  PHQ - 2 Score 0 0 0  Altered sleeping - - -  Tired, decreased energy - - -  Change in appetite - - -  Feeling bad or failure about yourself  - - -  Trouble concentrating - - -  Moving slowly or fidgety/restless - - -  Suicidal thoughts - - -  PHQ-9 Score - - -  Difficult doing work/chores - - -    Past Medical History:  Diagnosis Date  . Arthropathy, unspecified, site unspecified   . Esophageal reflux   . Osteoporosis, unspecified   . Other abnormal glucose   . Other and unspecified hyperlipidemia   . Symptomatic menopausal or female climacteric states    . Syncope and collapse   . Unspecified essential hypertension    Past Surgical History:  Procedure Laterality Date  . ABDOMINAL HYSTERECTOMY    . APPENDECTOMY    . BREAST BIOPSY Left    neg  . COLONOSCOPY    . FINGER FRACTURE SURGERY     Social History   Socioeconomic History  . Marital status: Widowed    Spouse name: Not on file  . Number of children: Not on file  . Years of education: Not on file  . Highest education level: Not on file  Social Needs  . Financial resource strain: Not on file  . Food insecurity - worry: Not on file  . Food insecurity - inability: Not on file  . Transportation needs - medical: Not on file  . Transportation needs - non-medical: Not on file  Occupational History  . Not on file  Tobacco Use  . Smoking status: Former Smoker    Packs/day: 1.00    Years: 26.00    Pack years: 26.00    Types: Cigarettes    Last attempt to quit: 06/07/1992    Years since quitting: 25.3  . Smokeless tobacco: Never Used  Substance and Sexual Activity  . Alcohol use: Yes    Alcohol/week: 0.0 oz    Comment: occassional  . Drug use: No  . Sexual activity: Not on file  Other Topics Concern  . Not on file  Social History Narrative  . Not on file   Family History  Problem Relation Age of Onset  . Diabetes Mother   . Heart disease Mother   . Heart attack Mother   . Breast cancer Mother 77  . Ulcers Father   . Gallstones Father   . Heart attack Brother    Current Outpatient Medications on File Prior to Visit  Medication Sig  . aspirin 81 MG tablet Take 81 mg by mouth daily.  . beta carotene w/minerals (OCUVITE) tablet Take 1 tablet by mouth daily.  . Calcium Carb-Cholecalciferol (CALTRATE 600+D) 600-800 MG-UNIT TABS Take 2 tablets by mouth daily.  . fluticasone (FLONASE) 50 MCG/ACT nasal spray Place 2 sprays into both nostrils daily.  Marland Kitchen ipratropium (ATROVENT) 0.06 % nasal spray Place 2 sprays into both nostrils 4 (four) times daily. Use as needed. For up  to 5-7 days then stop.  Marland Kitchen ketoconazole (NIZORAL) 2 % cream   . Krill Oil 300 MG CAPS Take by mouth daily.  Marland Kitchen loratadine (CLARITIN) 10 MG tablet Take 10 mg by mouth daily.  . Multiple Vitamin (MULTIVITAMIN) tablet Take 1 tablet by mouth daily.  . naproxen (NAPROSYN) 250 MG tablet   . pantoprazole (PROTONIX) 40 MG tablet Take 1 tablet (40 mg total) by mouth 2 (two) times daily.  . raloxifene (EVISTA) 60 MG tablet Take 1 tablet (60 mg total) by mouth daily.  . simvastatin (ZOCOR) 20 MG tablet Take 1 tablet (20 mg total) by mouth at bedtime.  Marland Kitchen Zoster Vac Recomb Adjuvanted [facility]) injection Inject into muscle 0.5mg  one dose and repeat within 6 months   No current facility-administered medications on file prior to visit.  Review of Systems  Constitutional: Positive for fatigue. Negative for activity change, appetite change, chills, diaphoresis, fever and unexpected weight change.  HENT: Negative for congestion and hearing loss.   Eyes: Negative for visual disturbance.  Respiratory: Negative for apnea, cough, choking, chest tightness, shortness of breath and wheezing.   Cardiovascular: Negative for chest pain, palpitations and leg swelling.  Gastrointestinal: Negative for abdominal pain, anal bleeding, blood in stool, constipation, diarrhea, nausea and vomiting.  Endocrine: Negative for cold intolerance and polyuria.  Genitourinary: Positive for urgency. Negative for decreased urine volume, difficulty urinating, dyspareunia, dysuria, frequency, hematuria and pelvic pain.  Musculoskeletal: Positive for arthralgias and back pain. Negative for neck pain.  Skin: Negative for rash.  Allergic/Immunologic: Negative for environmental allergies.  Neurological: Positive for weakness (legs with some low back pain, exertional). Negative for dizziness, light-headedness, numbness and headaches.  Hematological: Positive for adenopathy (or other mass R chest wall).  Psychiatric/Behavioral: Negative for  behavioral problems, dysphoric mood and sleep disturbance. The patient is not nervous/anxious.    Per HPI unless specifically indicated above     Objective:    BP (!) 142/74 (BP Location: Left Arm, Cuff Size: Normal)   Pulse 72   Temp 97.8 F (36.6 C) (Oral)   Resp 16   Ht 5\' 7"  (1.702 m)   Wt 230 lb (104.3 kg)   BMI 36.02 kg/m   Wt Readings from Last 3 Encounters:  10/15/17 230 lb (104.3 kg)  07/08/17 229 lb 9.6 oz (104.1 kg)  06/10/17 226 lb (102.5 kg)    Physical Exam  Constitutional: She is oriented to person, place, and time. She appears well-developed and well-nourished. No distress.  Well-appearing 72 year old female, comfortable, cooperative  HENT:  Head: Normocephalic and atraumatic.  Mouth/Throat: Oropharynx is clear and moist.  Eyes: Conjunctivae and EOM are normal. Pupils are equal, round, and reactive to light. Right eye exhibits no discharge. Left eye exhibits no discharge.  Neck: Normal range of motion. Neck supple. No thyromegaly present.  Cardiovascular: Normal rate, regular rhythm, normal heart sounds and intact distal pulses.  No murmur heard. Pulmonary/Chest: Effort normal and breath sounds normal. No respiratory distress. She has no wheezes. She has no rales.    Abdominal: Soft. Bowel sounds are normal. She exhibits no distension and no mass. There is no tenderness.  Musculoskeletal: Normal range of motion. She exhibits no edema or tenderness.  Upper / Lower Extremities: - Normal muscle tone, strength bilateral upper extremities 5/5, lower extremities 5/5  Low Back Inspection: Normal appearance, body habitus, no spinal deformity, symmetrical. Palpation: No tenderness over spinous processes. Left > right lumbar paraspinal muscles mild to moderately tender and without hypertonicity/spasm. ROM: Limited back extension and flexion, rotation L without discomfort due to pain Special Testing: Seated SLR negative for radicular pain bilaterally but had some pain  in back, L>R. - Standing facet load test positive bilateral L>R back pain Strength: Bilateral hip flex/ext 5/5, knee flex/ext 5/5, ankle dorsiflex/plantarflex 5/5 Neurovascular: intact distal sensation to light touch  Lymphadenopathy:    She has no cervical adenopathy.  Neurological: She is alert and oriented to person, place, and time. No cranial nerve deficit.  Distal sensation intact to light touch all extremities  DTR +1 symmetrical patellar, achilles  Skin: Skin is warm and dry. No rash noted. She is not diaphoretic. No erythema.  Psychiatric: She has a normal mood and affect. Her behavior is normal.  Well groomed, good eye contact, normal speech and thoughts  Nursing note  and vitals reviewed.  Results for orders placed or performed in visit on 10/13/17  VITAMIN D 25 Hydroxy (Vit-D Deficiency, Fractures)  Result Value Ref Range   Vit D, 25-Hydroxy 19 (L) 30 - 100 ng/mL  CBC with Differential/Platelet  Result Value Ref Range   WBC 5.0 3.8 - 10.8 Thousand/uL   RBC 4.58 3.80 - 5.10 Million/uL   Hemoglobin 13.6 11.7 - 15.5 g/dL   HCT 40.1 35.0 - 45.0 %   MCV 87.6 80.0 - 100.0 fL   MCH 29.7 27.0 - 33.0 pg   MCHC 33.9 32.0 - 36.0 g/dL   RDW 13.5 11.0 - 15.0 %   Platelets 189 140 - 400 Thousand/uL   MPV 11.9 7.5 - 12.5 fL   Neutro Abs 3,530 1,500 - 7,800 cells/uL   Lymphs Abs 890 850 - 3,900 cells/uL   WBC mixed population 480 200 - 950 cells/uL   Eosinophils Absolute 80 15 - 500 cells/uL   Basophils Absolute 20 0 - 200 cells/uL   Neutrophils Relative % 70.6 %   Total Lymphocyte 17.8 %   Monocytes Relative 9.6 %   Eosinophils Relative 1.6 %   Basophils Relative 0.4 %  Lipid panel  Result Value Ref Range   Cholesterol 203 (H) <200 mg/dL   HDL 53 >50 mg/dL   Triglycerides 280 (H) <150 mg/dL   LDL Cholesterol (Calc) 110 (H) mg/dL (calc)   Total CHOL/HDL Ratio 3.8 <5.0 (calc)   Non-HDL Cholesterol (Calc) 150 (H) <130 mg/dL (calc)  Hemoglobin A1c  Result Value Ref Range    Hgb A1c MFr Bld 6.0 (H) <5.7 % of total Hgb   Mean Plasma Glucose 126 (calc)   eAG (mmol/L) 7.0 (calc)  COMPLETE METABOLIC PANEL WITH GFR  Result Value Ref Range   Glucose, Bld 172 (H) 65 - 99 mg/dL   BUN 12 7 - 25 mg/dL   Creat 0.69 0.60 - 0.93 mg/dL   GFR, Est Non African American 86 > OR = 60 mL/min/1.43m2   GFR, Est African American 100 > OR = 60 mL/min/1.82m2   BUN/Creatinine Ratio NOT APPLICABLE 6 - 22 (calc)   Sodium 137 135 - 146 mmol/L   Potassium 4.0 3.5 - 5.3 mmol/L   Chloride 98 98 - 110 mmol/L   CO2 31 20 - 32 mmol/L   Calcium 9.7 8.6 - 10.4 mg/dL   Total Protein 6.7 6.1 - 8.1 g/dL   Albumin 4.2 3.6 - 5.1 g/dL   Globulin 2.5 1.9 - 3.7 g/dL (calc)   AG Ratio 1.7 1.0 - 2.5 (calc)   Total Bilirubin 0.8 0.2 - 1.2 mg/dL   Alkaline phosphatase (APISO) 72 33 - 130 U/L   AST 25 10 - 35 U/L   ALT 19 6 - 29 U/L      Assessment & Plan:   Problem List Items Addressed This Visit    Essential hypertension    Mildly elevated BP today, repeat improved No complications  Plan: 1. ADJUST meds - due to urinary urgency vs frequency on thiazide - INCREASE Amlodipine from 5 to 10mg  - new rx sent - REDUCE HCTZ from 25 to 12.5mg  daily - new rx sent - Continue Lisinopril 40mg  daily, refilled 2. Encouraged to continue adjusting diet plan to more regular diet, reviewed low sodium diet, start more regular exercise - try swimming at gym (silver sneakers) 3. Monitor BP at home, follow-up sooner if elevated      Relevant Medications   amLODipine (NORVASC) 10 MG tablet  hydrochlorothiazide (HYDRODIURIL) 12.5 MG tablet   lisinopril (PRINIVIL,ZESTRIL) 40 MG tablet   Obesity (BMI 35.0-39.9 without comorbidity)   Pre-diabetes    Stable control pre DM A1c 6.0 - recent worse diet Concern with obesity, HTN, HLD  Plan:  1. Not on any therapy currently  2. Encourage improved lifestyle - low carb, low sugar diet, reduce portion size, continue improving regular exercise - try swimming at gym,  silver sneakers 3. Follow-up q 3 month - consider low dose metformin in future      Primary osteoarthritis involving multiple joints    Likely related to LBP Check Lumbar X-ray today for update image Ultimately if related to radiculopathy or nerve impingement will need MRI if other work-up negative, consider referral back to Ortho See A&P      Spondylosis of lumbar region without myelopathy or radiculopathy    Likely contributing to subacute on chronic LBP Concern for possible claudication symptoms, consider neurogenic etiology See A&P      Relevant Orders   DG Lumbar Spine Complete   Vitamin D deficiency    Low Vitamin D Start OTC Vitamin D3 5,000 iu daily for 12 weeks then reduce to OTC Vitamin D3 2,000 iu daily for maintenance       Other Visit Diagnoses    Annual physical exam    -  Primary   Mass of chest wall, right     Questionable soft nodular mass 1-2 cm upper chest wall 12 o clock, seems more pectoral than breast tissue, but will investigate with imaging, diag mammo and [place] on R side, contact [facility] today and schedule per staff - She was scheduled for screening  Mammogram anyway    Relevant Orders   MM DIAG BREAST TOMO BILATERAL   US BREAST LTD UNI RIGHT INC AXILLA   Claudication of both lower extremities ([place])    Concerning newly reported symptoms gradual worsening x 12 months, now significant worsening few weeks to months. Given history limited duration walk before pain / weak, suggestive of claudication either arterial vs neurogenic, with known back DJD lumbar history most likely neurogenic, but not having other sequela of nerve impingement - Age and vascular risk factors, and some history of pedal edema cooler temp will rule out any vascular claudication first with testing - Ordered Vas [place] ABI - scheduled with [facility] on 10/21/17 - if concern will refer to Vascular - Ordered Lumbar X-ray today in office, pending result - update from last image 2014, if concerning and ABI  is negative, then will pursue referral to Spine/Ortho likely needs MRI - Additionally Statin induced myalgia certainly could be related, do not have CK level but LFTs were normal, seems less likely given predictable nature with ambulation activity, but it is possible, if other work-up seems to be negative, will notify patient and HOLD Simvastatin for few days to weeks to see if symptoms resolve     Relevant Orders   VAS [place] ABI WITH/WO TBI       Meds ordered this encounter  Medications  . Cholecalciferol (VITAMIN D3) 5000 units CAPS    Sig: Take 1 capsule (5,000 Units total) by mouth daily. For 8 weeks, then start Vitamin D3 2,000 units daily (OTC)    Dispense:  30 capsule    Refill:  0  . amLODipine (NORVASC) 10 MG tablet    Sig: Take 1 tablet (10 mg total) by mouth daily.    Dispense:  30 tablet    Refill:  5  Does changed from 5 to 10  . hydrochlorothiazide (HYDRODIURIL) 12.5 MG tablet    Sig: Take 1 tablet (12.5 mg total) by mouth daily.    Dispense:  30 tablet    Refill:  5    Dose changed from 25 to 12.5  . lisinopril (PRINIVIL,ZESTRIL) 40 MG tablet    Sig: Take 1 tablet (40 mg total) by mouth daily.    Dispense:  90 tablet    Refill:  3    Follow up plan: Return in about 6 weeks (around 11/26/2017) for 3-6 weeks for Leg weakness, [place] results.  Nobie Putnam, [facility] 10/15/2017, 11:18 PM

## 2017-10-15 NOTE — Assessment & Plan Note (Signed)
Low Vitamin D Start OTC Vitamin D3 5,000 iu daily for 12 weeks then reduce to OTC Vitamin D3 2,000 iu daily for maintenance  

## 2017-10-15 NOTE — Assessment & Plan Note (Signed)
Stable control pre DM A1c 6.0 - recent worse diet Concern with obesity, HTN, HLD  Plan:  1. Not on any therapy currently  2. Encourage improved lifestyle - low carb, low sugar diet, reduce portion size, continue improving regular exercise - try swimming at gym, silver sneakers 3. Follow-up q 3 month - consider low dose metformin in future

## 2017-10-15 NOTE — Assessment & Plan Note (Addendum)
Likely related to LBP Check Lumbar X-ray today for update image Ultimately if related to radiculopathy or nerve impingement will need MRI if other work-up negative, consider referral back to Ortho See A&P

## 2017-10-21 ENCOUNTER — Ambulatory Visit (INDEPENDENT_AMBULATORY_CARE_PROVIDER_SITE_OTHER): Payer: Medicare HMO

## 2017-10-21 DIAGNOSIS — I739 Peripheral vascular disease, unspecified: Secondary | ICD-10-CM

## 2017-10-22 ENCOUNTER — Telehealth: Payer: Self-pay | Admitting: Family Medicine

## 2017-10-22 DIAGNOSIS — M4716 Other spondylosis with myelopathy, lumbar region: Secondary | ICD-10-CM

## 2017-10-22 DIAGNOSIS — M5442 Lumbago with sciatica, left side: Secondary | ICD-10-CM

## 2017-10-22 DIAGNOSIS — I739 Peripheral vascular disease, unspecified: Secondary | ICD-10-CM

## 2017-10-22 DIAGNOSIS — G8929 Other chronic pain: Secondary | ICD-10-CM

## 2017-10-22 NOTE — Telephone Encounter (Signed)
Paperwork were faxed.

## 2017-10-22 NOTE — Telephone Encounter (Signed)
See previous office visit from 10/15/17 and recent X-ray and ABI result notes.  Agreement to proceed with referral to Neurosurgery vs Spine Specialist for evaluating her worsening low back symptoms and lower extremity heaviness vs weakness, possible neurogenic claudication symptoms. Her ABI was normal, not indicative of any vascular claudication. One other possible factor would be her Statin med which may be contributing to these weakness aching symptoms, but they are still present off med for about 1 week or less, but not as severe.  I have contacted Neurosurgery in [place] as recommended:  [place] [facility] [address] [place], [place] [address] Phone: [phone] Fax [phone] (Attn: Marin Comment)  I have reviewed the clinical history with them in triage, and they agree to proceed with referral, anticipate can see her within 1 week, and will likely consider a lumbar MRI even on same day of visit at their facility. They request a faxed copy of last office note and her most recent testing.  Patient to be notified.  Nobie Putnam, [facility] 10/22/2017, 9:56 AM

## 2017-10-24 ENCOUNTER — Ambulatory Visit
Admission: RE | Admit: 2017-10-24 | Discharge: 2017-10-24 | Disposition: A | Discharge status: Home / Self Care | Payer: Medicare HMO | Source: Ambulatory Visit | Attending: Family Medicine | Admitting: Family Medicine

## 2017-10-24 DIAGNOSIS — R222 Localized swelling, mass and lump, trunk: Secondary | ICD-10-CM

## 2017-10-24 DIAGNOSIS — D171 Benign lipomatous neoplasm of skin and subcutaneous tissue of trunk: Secondary | ICD-10-CM | POA: Insufficient documentation

## 2017-10-24 DIAGNOSIS — R928 Other abnormal and inconclusive findings on diagnostic imaging of breast: Secondary | ICD-10-CM | POA: Diagnosis not present

## 2017-10-24 DIAGNOSIS — N6489 Other specified disorders of breast: Secondary | ICD-10-CM | POA: Diagnosis not present

## 2017-10-28 DIAGNOSIS — I739 Peripheral vascular disease, unspecified: Secondary | ICD-10-CM | POA: Diagnosis not present

## 2017-10-28 DIAGNOSIS — M4807 Spinal stenosis, lumbosacral region: Secondary | ICD-10-CM | POA: Diagnosis not present

## 2017-11-03 ENCOUNTER — Encounter: Payer: Self-pay | Admitting: Family Medicine

## 2017-11-03 ENCOUNTER — Other Ambulatory Visit: Payer: Self-pay | Admitting: Neurosurgery

## 2017-11-03 DIAGNOSIS — M4807 Spinal stenosis, lumbosacral region: Secondary | ICD-10-CM

## 2017-11-08 ENCOUNTER — Ambulatory Visit
Admission: RE | Admit: 2017-11-08 | Discharge: 2017-11-08 | Disposition: A | Discharge status: Home / Self Care | Payer: Medicare HMO | Source: Ambulatory Visit | Attending: Neurosurgery | Admitting: Neurosurgery

## 2017-11-08 ENCOUNTER — Ambulatory Visit: Payer: Medicare HMO

## 2017-11-08 DIAGNOSIS — M5126 Other intervertebral disc displacement, lumbar region: Secondary | ICD-10-CM | POA: Insufficient documentation

## 2017-11-08 DIAGNOSIS — M4807 Spinal stenosis, lumbosacral region: Secondary | ICD-10-CM

## 2017-11-08 DIAGNOSIS — M48061 Spinal stenosis, lumbar region without neurogenic claudication: Secondary | ICD-10-CM | POA: Insufficient documentation

## 2017-11-08 DIAGNOSIS — M47896 Other spondylosis, lumbar region: Secondary | ICD-10-CM | POA: Diagnosis not present

## 2017-11-08 DIAGNOSIS — N281 Cyst of kidney, acquired: Secondary | ICD-10-CM | POA: Insufficient documentation

## 2017-11-08 DIAGNOSIS — M47817 Spondylosis without myelopathy or radiculopathy, lumbosacral region: Secondary | ICD-10-CM | POA: Diagnosis not present

## 2017-11-11 ENCOUNTER — Other Ambulatory Visit: Payer: Self-pay | Admitting: Neurosurgery

## 2017-11-11 DIAGNOSIS — N281 Cyst of kidney, acquired: Secondary | ICD-10-CM

## 2017-11-11 DIAGNOSIS — M5440 Lumbago with sciatica, unspecified side: Secondary | ICD-10-CM | POA: Diagnosis not present

## 2017-11-12 ENCOUNTER — Encounter: Payer: Self-pay | Admitting: Family Medicine

## 2017-11-12 ENCOUNTER — Ambulatory Visit
Admission: RE | Admit: 2017-11-12 | Discharge: 2017-11-12 | Disposition: A | Discharge status: Home / Self Care | Payer: Medicare HMO | Source: Ambulatory Visit | Attending: Neurosurgery | Admitting: Neurosurgery

## 2017-11-12 DIAGNOSIS — N281 Cyst of kidney, acquired: Secondary | ICD-10-CM | POA: Insufficient documentation

## 2017-11-12 DIAGNOSIS — Q6102 Congenital multiple renal cysts: Secondary | ICD-10-CM | POA: Insufficient documentation

## 2017-11-17 DIAGNOSIS — M545 Low back pain: Secondary | ICD-10-CM | POA: Diagnosis not present

## 2017-11-19 DIAGNOSIS — M545 Low back pain: Secondary | ICD-10-CM | POA: Diagnosis not present

## 2017-11-26 DIAGNOSIS — M545 Low back pain: Secondary | ICD-10-CM | POA: Diagnosis not present

## 2017-11-28 DIAGNOSIS — M545 Low back pain: Secondary | ICD-10-CM | POA: Diagnosis not present

## 2017-12-01 ENCOUNTER — Other Ambulatory Visit: Payer: Self-pay

## 2017-12-01 DIAGNOSIS — Z79810 Long term (current) use of selective estrogen receptor modulators (SERMs): Secondary | ICD-10-CM

## 2017-12-01 MED ORDER — RALOXIFENE HCL 60 MG PO TABS: 60.0000 mg | ORAL_TABLET | Freq: Every day | ORAL | 3 refills | Status: DC

## 2017-12-02 DIAGNOSIS — M545 Low back pain: Secondary | ICD-10-CM | POA: Diagnosis not present

## 2017-12-04 DIAGNOSIS — M545 Low back pain: Secondary | ICD-10-CM | POA: Diagnosis not present

## 2017-12-08 ENCOUNTER — Telehealth: Payer: Self-pay | Admitting: Family Medicine

## 2017-12-08 DIAGNOSIS — T753XXA Motion sickness, initial encounter: Secondary | ICD-10-CM

## 2017-12-08 DIAGNOSIS — M545 Low back pain: Secondary | ICD-10-CM | POA: Diagnosis not present

## 2017-12-08 NOTE — Telephone Encounter (Signed)
Pt. called states that she was sailing out 3/31  request scopolamine patch  But it needed to be tier 4 ??? When called in. [employer]  [facility] Pt. Call back  # is  [phone] rt

## 2017-12-09 MED ORDER — SCOPOLAMINE 1 MG/3DAYS TD PT72: 1.0000 | MEDICATED_PATCH | TRANSDERMAL | 1 refills | Status: DC

## 2017-12-09 NOTE — Telephone Encounter (Signed)
Pt.notified

## 2017-12-09 NOTE — Telephone Encounter (Signed)
Sent rx patches to [facility].  Nobie Putnam, [facility] 12/09/2017, 8:15 AM

## 2017-12-23 DIAGNOSIS — M5126 Other intervertebral disc displacement, lumbar region: Secondary | ICD-10-CM | POA: Diagnosis not present

## 2018-02-06 ENCOUNTER — Ambulatory Visit
Admission: RE | Admit: 2018-02-06 | Discharge: 2018-02-06 | Disposition: A | Discharge status: Home / Self Care | Payer: Medicare HMO | Source: Ambulatory Visit | Attending: Family Medicine | Admitting: Family Medicine

## 2018-02-06 ENCOUNTER — Ambulatory Visit (INDEPENDENT_AMBULATORY_CARE_PROVIDER_SITE_OTHER): Payer: Medicare HMO | Admitting: Family Medicine

## 2018-02-06 ENCOUNTER — Encounter: Payer: Self-pay | Admitting: Family Medicine

## 2018-02-06 VITALS — BP 160/72 | HR 66 | Temp 98.3°F | Resp 16 | Ht 67.0 in | Wt 239.0 lb

## 2018-02-06 DIAGNOSIS — R600 Localized edema: Secondary | ICD-10-CM

## 2018-02-06 DIAGNOSIS — J9801 Acute bronchospasm: Secondary | ICD-10-CM | POA: Diagnosis not present

## 2018-02-06 DIAGNOSIS — I517 Cardiomegaly: Secondary | ICD-10-CM | POA: Diagnosis not present

## 2018-02-06 DIAGNOSIS — J209 Acute bronchitis, unspecified: Secondary | ICD-10-CM | POA: Diagnosis not present

## 2018-02-06 DIAGNOSIS — R0602 Shortness of breath: Secondary | ICD-10-CM | POA: Diagnosis not present

## 2018-02-06 IMAGING — US US BREAST*R* LIMITED INC AXILLA
1 series · 4 of 4 positions shown · non-contrast
Comparison: Previous exam(s).

CLINICAL DATA: Palpable lump just beneath the right clavicle.

EXAM:
2D DIGITAL DIAGNOSTIC BILATERAL MAMMOGRAM WITH CAD AND ADJUNCT TOMO
ULTRASOUND RIGHT BREAST

[Series 1: us breast*right* limited inc axilla · 0.07mm/px · 4 of 4 slices shown]
[im 1/4]
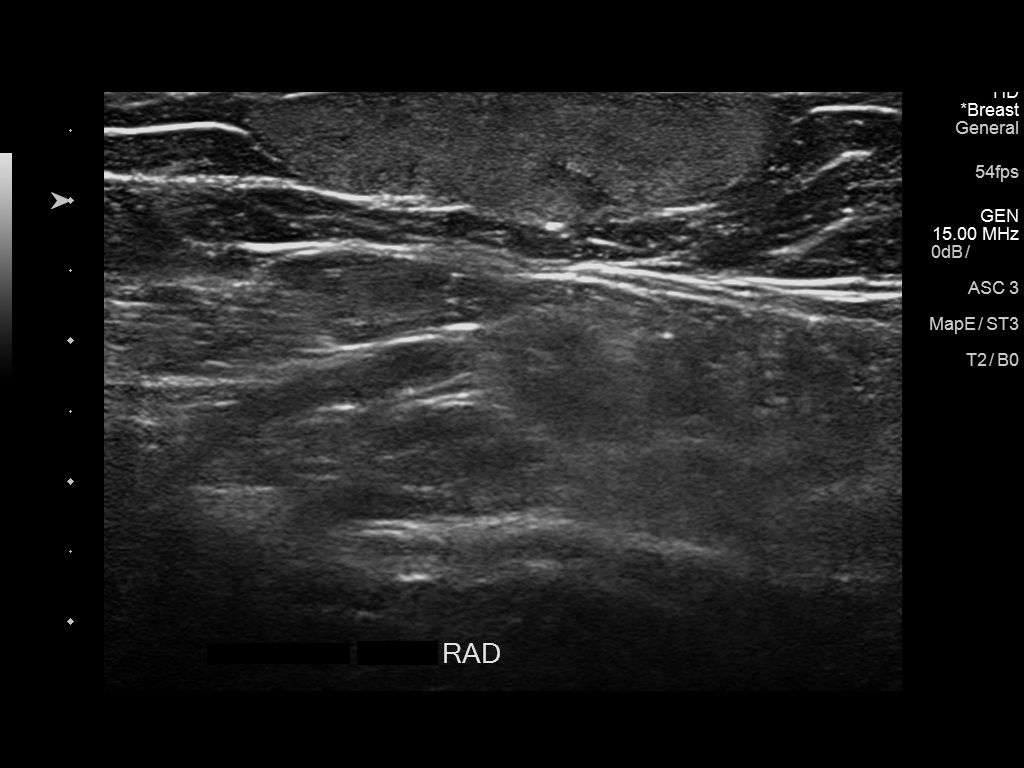
[im 2/4]
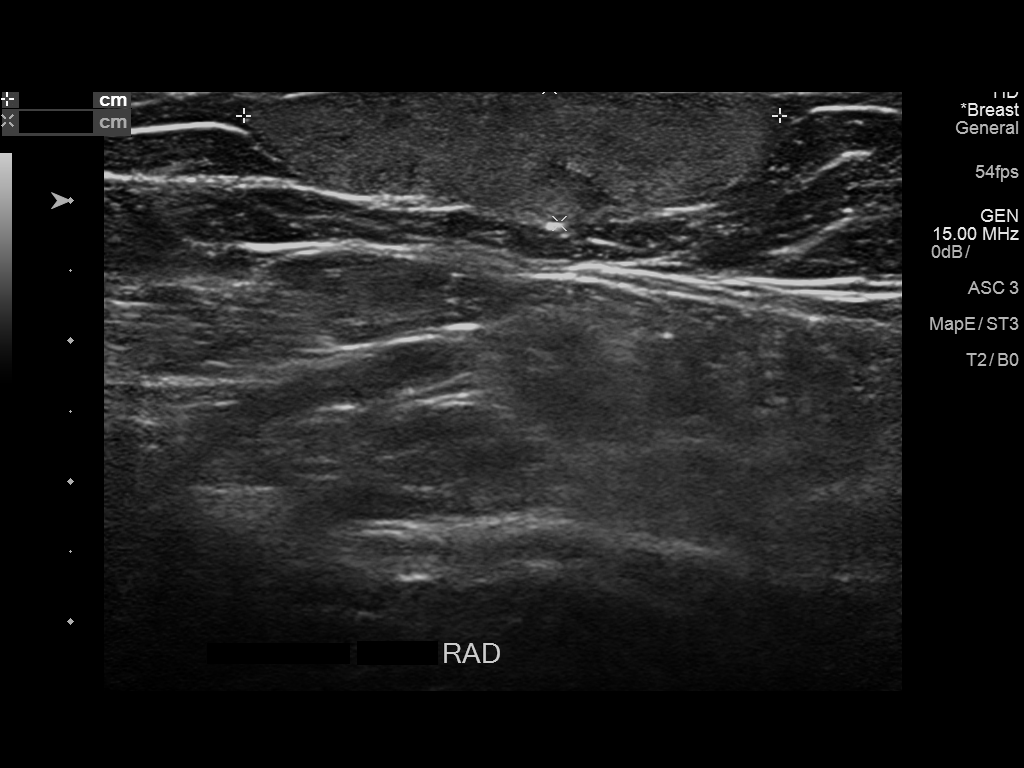
[im 3/4]
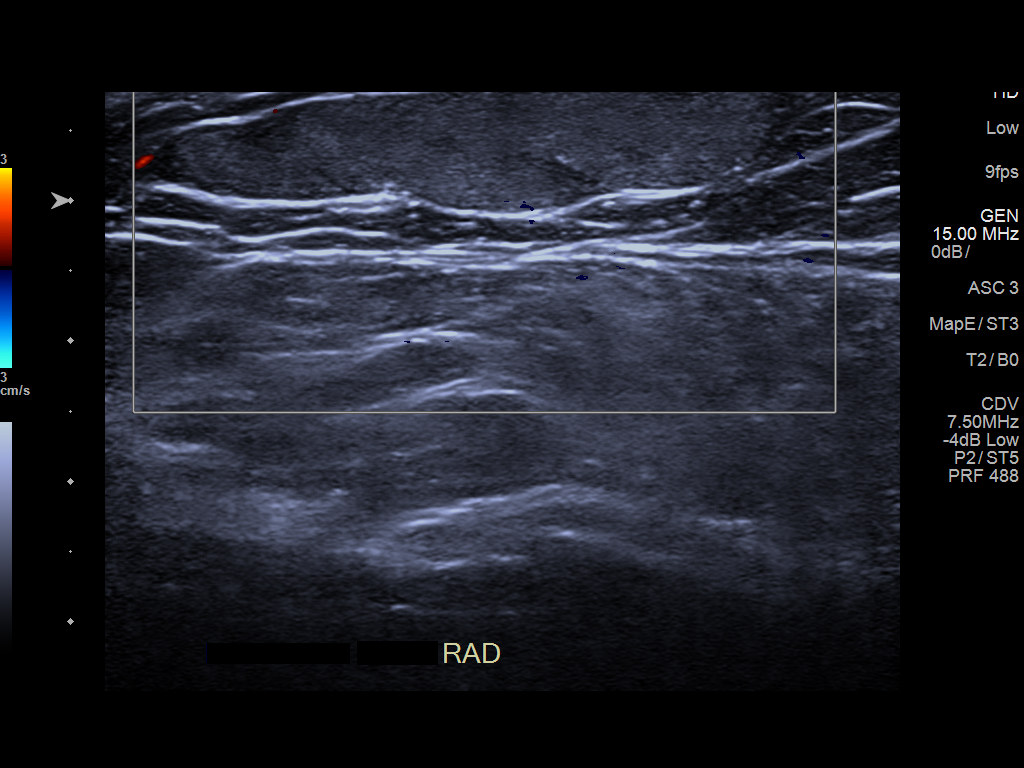
[im 4/4]
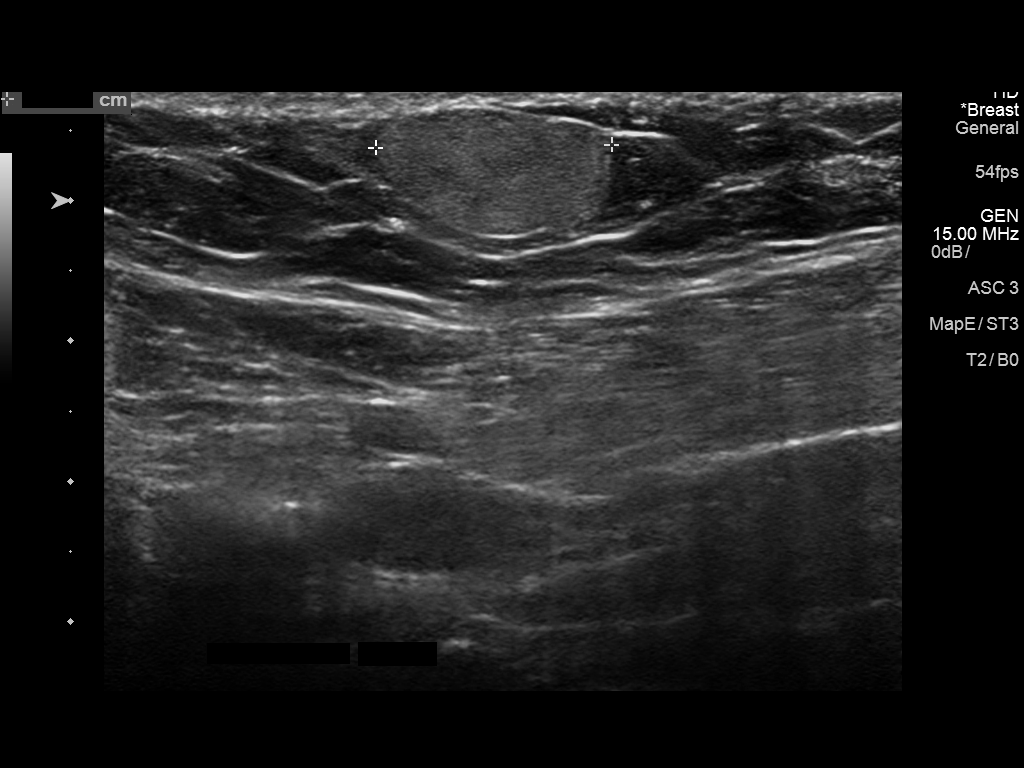

[4 of 4 positions shown; findings below may reference images not displayed]

ACR Breast Density Category b: There are scattered areas of
fibroglandular density.
FINDINGS: Cc and MLO views, spot tangential view of right breast are
submitted. No suspicious abnormalities identified bilaterally.

Mammographic images were processed with CAD.

Targeted ultrasound is performed, showing 3.8 x 1 x 1.7 cm oval
homogeneous hyperechoic lesion at the palpable area beneath the
right clavicle, not in the breast tissue. This is consistent with a
lipoma.
IMPRESSION: Benign findings.

RECOMMENDATION:
Management on clinical basis for lipoma beneath right clavicle.

I have discussed the findings and recommendations with the patient.
Results were also provided in writing at the conclusion of the
visit. If applicable, a reminder letter will be sent to the patient
regarding the next appointment.

BI-RADS CATEGORY  2: Benign.

## 2018-02-06 MED ORDER — DOXYCYCLINE HYCLATE 100 MG PO TABS: 100.0000 mg | ORAL_TABLET | Freq: Two times a day (BID) | ORAL | 0 refills | Status: DC

## 2018-02-06 MED ORDER — ALBUTEROL SULFATE HFA 108 (90 BASE) MCG/ACT IN AERS
2.0000 | INHALATION_SPRAY | RESPIRATORY_TRACT | 0 refills | Status: DC | PRN
Start: 2018-02-06 — End: 2019-08-30

## 2018-02-06 MED ORDER — IPRATROPIUM-ALBUTEROL 0.5-2.5 (3) MG/3ML IN SOLN: 3.0000 mL | Freq: Once | RESPIRATORY_TRACT | Status: DC

## 2018-02-06 MED ORDER — PREDNISONE 50 MG PO TABS: 50.0000 mg | ORAL_TABLET | Freq: Every day | ORAL | 0 refills | Status: DC

## 2018-02-06 NOTE — Progress Notes (Signed)
Subjective:    Patient ID: Yvette [name], female    DOB: [birth year], 72 y.o.   MRN: [id number]  Yvette [name] is a 72 y.o. female presenting on 02/06/2018 for chest congestion (SOB, chills only Sunday and Monday night and from onset wednesday sweats, fatigue and rattling cough obtw patient had 3 tick bites saturday )  Patient presents for a same day appointment.  HPI   DYSPNEA / WHEEZING Reports symptoms started with congestion about 5 days ago Sunday when she woke up, she has history of allergies was taking Loratadine every day to prevent allergies, she started drinking more clear fluids. She describes symptoms with some worsening of congestion in upper chest, with some rattling with breathing when she breathes at night, and cough seems "tight" without loose, not as productive - Taking Flonase daily - She has not tried taking any Mucinex yet - No known sick contacts. She thinks possible trigger may have been out in yard with pollen recently - She found 3 ticks on her after yard work, she had one embedded on her back for about 24 hours without rash, she removed it, and other 2 were superficial without problem - Additional history had prior Cardiac work-up in 2014 with [facility], had normal ECHO and Nuclear stress, she was released. However she states family member advised her to get her breathing checked out since she has been walking causing "heavy breathing" - Admits broke out into a sweat overnight x 2 during week without fever - Admits some dyspnea at times, worse at night woke up felt a "heavy pressure on chest" needed to sit up to catch breath, it improved - Admits some swelling in ankles recently for few days - Admits nauseas without vomiting - Denies document fever, vomiting, abdominal pain, diarrhea, active chest pain, lightheadedness, dizziness, vertigo  Depression screen [facility] 2/9 10/15/2017 07/08/2017 01/02/2017  Decreased Interest 0 0 0  Down, Depressed, Hopeless 0 0 0  PHQ -  2 Score 0 0 0  Altered sleeping - - -  Tired, decreased energy - - -  Change in appetite - - -  Feeling bad or failure about yourself  - - -  Trouble concentrating - - -  Moving slowly or fidgety/restless - - -  Suicidal thoughts - - -  PHQ-9 Score - - -  Difficult doing work/chores - - -    Social History   Tobacco Use  . Smoking status: Former Smoker    Packs/day: 1.00    Years: 26.00    Pack years: 26.00    Types: Cigarettes    Last attempt to quit: 06/07/1992    Years since quitting: 25.6  . Smokeless tobacco: Never Used  Substance Use Topics  . Alcohol use: Yes    Alcohol/week: 0.0 oz    Comment: occassional  . Drug use: No    Review of Systems Per HPI unless specifically indicated above     Objective:    BP (!) 160/72   Pulse 66   Temp 98.3 F (36.8 C) (Oral)   Resp 16   Ht 5\' 7"  (1.702 m)   Wt 239 lb (108.4 kg)   SpO2 99%   BMI 37.43 kg/m   Wt Readings from Last 3 Encounters:  02/06/18 239 lb (108.4 kg)  10/15/17 230 lb (104.3 kg)  07/08/17 229 lb 9.6 oz (104.1 kg)    Physical Exam  Constitutional: She is oriented to person, place, and time. She appears well-developed and well-nourished.  No distress.  Mildly ill and tired appearing but otherwise well, cooperative  HENT:  Head: Normocephalic and atraumatic.  Mouth/Throat: Oropharynx is clear and moist.  Frontal / maxillary sinuses non-tender. Nares patent without purulence or edema. Bilateral TMs clear without erythema, effusion or bulging. Oropharynx clear without erythema, exudates, edema or asymmetry.  Eyes: Conjunctivae are normal. Right eye exhibits no discharge. Left eye exhibits no discharge.  Neck: Normal range of motion. Neck supple. No thyromegaly present.  Cardiovascular: Normal rate, regular rhythm, normal heart sounds and intact distal pulses.  No murmur heard. Pulmonary/Chest: No respiratory distress. She has wheezes (diffuse end exp wheezing). She has no rales.  Pre-Nebulizer  Treatment Mild increased work of breathing with unable to take full deep breaths, some diffuse reduced air movement with tight exp wheezing and some transmitted upper airway sounds, no focal crackles  Post-Nebulizer (Duoneb x 1) Treatment Moderate improvement in air movement, improved but still present mild exp wheezing. Still some transmitted upper airway wheeze now and less lower respiratory tract abnormality.   Musculoskeletal: Normal range of motion. She exhibits edema (stable +1 pitting lower ext edema symmetrical).  Lymphadenopathy:    She has no cervical adenopathy.  Neurological: She is alert and oriented to person, place, and time.  Skin: Skin is warm and dry. No rash noted. She is not diaphoretic. No erythema.  Psychiatric: She has a normal mood and affect. Her behavior is normal.  Well groomed, good eye contact, normal speech and thoughts  Nursing note and vitals reviewed.    CLINICAL DATA:  Short of breath, wheezing, acute tightness of the chest and edema of the legs, former smoking history  EXAM: CHEST - 2 VIEW  COMPARISON:  Portable chest x-ray of 06/06/2015  FINDINGS: No active infiltrate or effusion is seen. Mediastinal and hilar contours are unremarkable. There is some peribronchial thickening present which may indicate bronchitis in this patient with smoking history. The heart is mildly enlarged and stable. No acute bony abnormality seen with degenerative change throughout the thoracic spine.  IMPRESSION: 1. No active infiltrate or effusion. 2. Peribronchial thickening may indicate bronchitis. 3. Stable borderline cardiomegaly.   Electronically Signed   By: Ivar Drape M.D.   On: 02/06/2018 11:39  Results for orders placed or performed in visit on 10/13/17  VITAMIN D 25 Hydroxy (Vit-D Deficiency, Fractures)  Result Value Ref Range   Vit D, 25-Hydroxy 19 (L) 30 - 100 ng/mL  CBC with Differential/Platelet  Result Value Ref Range   WBC 5.0 3.8 -  10.8 Thousand/uL   RBC 4.58 3.80 - 5.10 Million/uL   Hemoglobin 13.6 11.7 - 15.5 g/dL   HCT 40.1 35.0 - 45.0 %   MCV 87.6 80.0 - 100.0 fL   MCH 29.7 27.0 - 33.0 pg   MCHC 33.9 32.0 - 36.0 g/dL   RDW 13.5 11.0 - 15.0 %   Platelets 189 140 - 400 Thousand/uL   MPV 11.9 7.5 - 12.5 fL   Neutro Abs 3,530 1,500 - 7,800 cells/uL   Lymphs Abs 890 850 - 3,900 cells/uL   WBC mixed population 480 200 - 950 cells/uL   Eosinophils Absolute 80 15 - 500 cells/uL   Basophils Absolute 20 0 - 200 cells/uL   Neutrophils Relative % 70.6 %   Total Lymphocyte 17.8 %   Monocytes Relative 9.6 %   Eosinophils Relative 1.6 %   Basophils Relative 0.4 %  Lipid panel  Result Value Ref Range   Cholesterol 203 (H) <200 mg/dL  HDL 53 >50 mg/dL   Triglycerides 280 (H) <150 mg/dL   LDL Cholesterol (Calc) 110 (H) mg/dL (calc)   Total CHOL/HDL Ratio 3.8 <5.0 (calc)   Non-HDL Cholesterol (Calc) 150 (H) <130 mg/dL (calc)  Hemoglobin A1c  Result Value Ref Range   Hgb A1c MFr Bld 6.0 (H) <5.7 % of total Hgb   Mean Plasma Glucose 126 (calc)   eAG (mmol/L) 7.0 (calc)  COMPLETE METABOLIC PANEL WITH GFR  Result Value Ref Range   Glucose, Bld 172 (H) 65 - 99 mg/dL   BUN 12 7 - 25 mg/dL   Creat 0.69 0.60 - 0.93 mg/dL   GFR, Est Non African American 86 > OR = 60 mL/min/1.54m2   GFR, Est African American 100 > OR = 60 mL/min/1.76m2   BUN/Creatinine Ratio NOT APPLICABLE 6 - 22 (calc)   Sodium 137 135 - 146 mmol/L   Potassium 4.0 3.5 - 5.3 mmol/L   Chloride 98 98 - 110 mmol/L   CO2 31 20 - 32 mmol/L   Calcium 9.7 8.6 - 10.4 mg/dL   Total Protein 6.7 6.1 - 8.1 g/dL   Albumin 4.2 3.6 - 5.1 g/dL   Globulin 2.5 1.9 - 3.7 g/dL (calc)   AG Ratio 1.7 1.0 - 2.5 (calc)   Total Bilirubin 0.8 0.2 - 1.2 mg/dL   Alkaline phosphatase (APISO) 72 33 - 130 U/L   AST 25 10 - 35 U/L   ALT 19 6 - 29 U/L      Assessment & Plan:   Problem List Items Addressed This Visit    Dyspnea   Relevant Medications   ipratropium-albuterol  (DUONEB) 0.5-2.5 (3) MG/3ML nebulizer solution 3 mL   Other Relevant Orders   DG Chest 2 View (Completed)    Other Visit Diagnoses    Acute bronchitis with bronchospasm    -  Primary   Relevant Medications   predniSONE (DELTASONE) 50 MG tablet   doxycycline (VIBRA-TABS) 100 MG tablet   albuterol (PROVENTIL HFA;VENTOLIN HFA) 108 (90 Base) MCG/ACT inhaler   Acute bronchospasm       Relevant Medications   ipratropium-albuterol (DUONEB) 0.5-2.5 (3) MG/3ML nebulizer solution 3 mL   Other Relevant Orders   DG Chest 2 View (Completed)   Bilateral lower extremity edema       Relevant Orders   DG Chest 2 View (Completed)      Most consistent with acute exacerbation of bronchitis with bronchospasm with worsening symptoms - No prior dx COPD or Asthma. She is former smoker of >25+ years. - No hypoxia (99% on RA), afebrile, no recent hospitalization Differential includes possible hypervolemia vs CHF (no prior dx, last ECHO 2014), concern does have additional dyspnea symptoms on exertion and some orthopnea symptoms, w/ edema  Plan: 1. CXR STAT - results above, most consistent with bronchitis, without pneumonia or edema, lungs not necessarily appear COPD, reviewed with patient in office - Start Duoneb treatment x1 in office - significant improvement post neb - Start empiric antibiotic coverage given severity of symptoms, and limited f/u headed into weekend - written for Doxycycline 100mg  BID x 7 days - Start Prednisone 50mg  x 5 day steroid burst - Rx trial albuterol q 4 hr regularly x 2-3 days - Follow-up about 1 week if not improving, otherwise strict return criteria to go to ED  Future discussed likely may need referral to Cardiology for re-evaluation of ischemic work-up, eval repeat ECHO given chronic gradual worse exertional dyspnea.   Meds ordered this encounter  Medications  . ipratropium-albuterol (DUONEB) 0.5-2.5 (3) MG/3ML nebulizer solution 3 mL  . predniSONE (DELTASONE) 50 MG  tablet    Sig: Take 1 tablet (50 mg total) by mouth daily with breakfast.    Dispense:  5 tablet    Refill:  0  . doxycycline (VIBRA-TABS) 100 MG tablet    Sig: Take 1 tablet (100 mg total) by mouth 2 (two) times daily. For 7 days. Take with full glass of water, stay upright 30 min after taking.    Dispense:  14 tablet    Refill:  0  . albuterol (PROVENTIL HFA;VENTOLIN HFA) 108 (90 Base) MCG/ACT inhaler    Sig: Inhale 2 puffs into the lungs every 4 (four) hours as needed for wheezing or shortness of breath (cough).    Dispense:  1 Inhaler    Refill:  0    Additionally completed handicap placard form today - cannot walk 200 ft w/o rest, uses cane for assistance, has underlying orthopedic problem (back lumbar DJD)   Follow up plan: Return in about 1 week (around 02/13/2018), or if symptoms worsen or fail to improve, for bronchitis.  Nobie Putnam, [facility] 02/06/2018, 10:28 PM

## 2018-02-06 NOTE — Patient Instructions (Addendum)
Thank you for coming to the office today  1. It sounds like you had an Upper Respiratory Virus that has settled into a Bronchitis, lower respiratory tract infection. I don't have concerns for pneumonia today, and think that this should gradually improve. Once you are feeling better, the cough may take a few weeks to fully resolve. I do hear wheezing and coarse breath sounds, this may be due to the virus, also could be related to smoking.  Start Doxycycline 100mg  twice daily for 7 days  - Start Prednisone 50mg  daily for next 5 days - this will open up lungs allow you to breath better and treat that wheezing or bronchospasm - Use Albuterol inhaler 2 puffs every 4-6 hours around the clock for next 2-3 days, max up to 5 days then use as needed (ask pharmacist to demonstrate proper inhaler use)  May try OTC Mucinex (or may try Mucinex-DM for cough) up to 7-10 days then stop  - Use nasal saline (Simply Saline or Ocean Spray) to flush nasal congestion multiple times a day, may help cough - Drink plenty of fluids to improve congestion  If your symptoms seem to worsen instead of improve over next several days, including significant fever / chills, worsening shortness of breath, worsening wheezing, or nausea / vomiting and can't take medicines - return sooner or go to hospital Emergency Department for more immediate treatment.  If not improved overall dyspnea we may need to refer you to Cardiologist in future   Please schedule a Follow-up Appointment to: Return in about 1 week (around 02/13/2018), or if symptoms worsen or fail to improve, for bronchitis.  If you have any other questions or concerns, please feel free to call the office or send a message through [place]. You may also schedule an earlier appointment if necessary.  Additionally, you may be receiving a survey about your experience at our office within a few days to 1 week by e-mail or mail. We value your feedback.  Nobie Putnam,  DO [place]

## 2018-02-13 ENCOUNTER — Other Ambulatory Visit: Payer: Self-pay

## 2018-02-13 DIAGNOSIS — Z789 Other specified health status: Secondary | ICD-10-CM

## 2018-02-13 DIAGNOSIS — K219 Gastro-esophageal reflux disease without esophagitis: Secondary | ICD-10-CM

## 2018-02-13 MED ORDER — PANTOPRAZOLE SODIUM 40 MG PO TBEC: 40.0000 mg | DELAYED_RELEASE_TABLET | Freq: Every day | ORAL | 3 refills | Status: DC

## 2018-02-13 NOTE — Telephone Encounter (Signed)
Refill request for pantoprazole 40mg .  [facility]

## 2018-02-13 NOTE — Telephone Encounter (Signed)
Called patient. She was rx Protonix 48m BID in past, old rx on file. She is now taking 495mdaily most days, and rarely if flare can take extra dose in evening for BID. She request 90 day supply of daily rx.  Also checked on her, breathing is improved since last visit 02/06/18 with acute respiratory illness, overall still improving, if not resolved after few weeks follow plan to return and consider Cardiology or other investigation for dyspnea.  Additionally she asks about MMR status measles immunity, I advised we can check lab titer MMR at her convenience, lab is ordered she will schedule lab only when ready.  Yvette PutnamDOPryor Creekroup 02/13/2018, 12:56 PM

## 2018-03-19 DIAGNOSIS — M5440 Lumbago with sciatica, unspecified side: Secondary | ICD-10-CM | POA: Diagnosis not present

## 2018-03-19 DIAGNOSIS — I10 Essential (primary) hypertension: Secondary | ICD-10-CM | POA: Diagnosis not present

## 2018-03-19 DIAGNOSIS — Z6839 Body mass index (BMI) 39.0-39.9, adult: Secondary | ICD-10-CM | POA: Diagnosis not present

## 2018-03-30 ENCOUNTER — Encounter: Payer: Self-pay | Admitting: Family Medicine

## 2018-04-01 ENCOUNTER — Encounter: Payer: Self-pay | Admitting: Family Medicine

## 2018-04-01 ENCOUNTER — Ambulatory Visit (INDEPENDENT_AMBULATORY_CARE_PROVIDER_SITE_OTHER): Payer: Medicare HMO | Admitting: Family Medicine

## 2018-04-01 VITALS — BP 158/73 | HR 71 | Temp 98.1°F | Resp 16 | Ht 67.0 in | Wt 242.0 lb

## 2018-04-01 DIAGNOSIS — I10 Essential (primary) hypertension: Secondary | ICD-10-CM | POA: Diagnosis not present

## 2018-04-01 DIAGNOSIS — R0600 Dyspnea, unspecified: Secondary | ICD-10-CM

## 2018-04-01 DIAGNOSIS — R600 Localized edema: Secondary | ICD-10-CM | POA: Diagnosis not present

## 2018-04-01 DIAGNOSIS — R0609 Other forms of dyspnea: Secondary | ICD-10-CM

## 2018-04-01 DIAGNOSIS — R222 Localized swelling, mass and lump, trunk: Secondary | ICD-10-CM

## 2018-04-01 NOTE — Progress Notes (Signed)
Subjective:    Patient ID: Yvette [name], female    DOB: Feb 13, 1946, 72 y.o.   MRN: [id number]  Yvette [name] is a 72 y.o. female presenting on 04/01/2018 for Foot Swelling and Shortness of Breath   HPI   Follow-up Episodic Exertional Dyspnea / Lower Extremity Swelling Last seen by me 02/06/18 for acute respiratory symptoms consistent with bronchitis at that time with associated bronchospasm, had chest x-ray, treated with duoneb, doxycycline, prednisone and albuterol inhaler PRN, she got dramatically better from that treatment. She has had a lingering cough episodic, thinks environmental. See last note for details and background. - Today she is here due to persistent concerns for possible "heavy breathing" mostly on exertion or episodic, her family was concerned as well, see mychart message from 03/30/18 detailing this. - Currently feels about same, still has some minimal symptoms at rest maybe notices she does not take a "full deep breath" but most of her symptoms are with walking and with exertion - Past history additional history had prior Cardiac work-up in 2014 with [facility] Dr Fletcher Anon, had normal ECHO and Nuclear stress, she was released at that time. - She is former smoker, quit >30 years, had some long history of 2nd hand smoke, never dx with COPD - Also admits recent stress, selling property, admits stress eating and weight gain - Admits some episodic swelling in feet and ankles, worse Left > Right, mostly foot and ankle, without redness or pain, she has tried elevation. No personal history of DVT / PE - Fam history of granddaughter with blood clot in leg, they did not diagnose a hereditary condition for increased, they think it was potentially provoked by long car trip - Prior vascular ultrasound in 10/2017  Denies active chest pain or pressure, sweating, nausea vomiting, productive cough, fever chills   FOLLOW-UP Nodule Right Chest wall Previously evaluated this about 6 months  ago with her annual visit 10/2017, she had this present for >1 year before it seems. Episodic recurrent swelling and tender and then improve, she was ordered to have mammogram but it did not capture this spot in the image, now asking about other imaging. It has not changed or worsened since then - today not tender  Previous updates - gradual improve lower extremity weakness, off simvastatin and also followed by neurosurgery without other complications    Depression screen [facility] 2/9 04/01/2018 10/15/2017 07/08/2017  Decreased Interest 0 0 0  Down, Depressed, Hopeless 0 0 0  PHQ - 2 Score 0 0 0  Altered sleeping - - -  Tired, decreased energy - - -  Change in appetite - - -  Feeling bad or failure about yourself  - - -  Trouble concentrating - - -  Moving slowly or fidgety/restless - - -  Suicidal thoughts - - -  PHQ-9 Score - - -  Difficult doing work/chores - - -    Social History   Tobacco Use  . Smoking status: Former Smoker    Packs/day: 1.00    Years: 26.00    Pack years: 26.00    Types: Cigarettes    Last attempt to quit: 06/07/1992    Years since quitting: 25.8  . Smokeless tobacco: Never Used  Substance Use Topics  . Alcohol use: Yes    Alcohol/week: 0.0 oz    Comment: occassional  . Drug use: No    Review of Systems Per HPI unless specifically indicated above     Objective:  BP (!) 158/73   Pulse 71   Temp 98.1 F (36.7 C) (Oral)   Resp 16   Ht 5\' 7"  (1.702 m)   Wt 242 lb (109.8 kg)   BMI 37.90 kg/m   Wt Readings from Last 3 Encounters:  04/01/18 242 lb (109.8 kg)  02/06/18 239 lb (108.4 kg)  10/15/17 230 lb (104.3 kg)    Physical Exam  Constitutional: She is oriented to person, place, and time. She appears well-developed and well-nourished. No distress.  Well-appearing, comfortable, cooperative, obese  HENT:  Head: Normocephalic and atraumatic.  Mouth/Throat: Oropharynx is clear and moist.  Eyes: Conjunctivae are normal. Right eye exhibits no  discharge. Left eye exhibits no discharge.  Neck: Normal range of motion. Neck supple. No thyromegaly present.  Cardiovascular: Normal rate, regular rhythm, normal heart sounds and intact distal pulses.  No murmur heard. Pulmonary/Chest: Effort normal. No respiratory distress. She has decreased breath sounds (Slightly coarse vs reduced breath sounds diffusely, non focal). She has no wheezes. She has no rales.  Musculoskeletal: Normal range of motion.       Right lower leg: She exhibits edema (Mostly localized to left foot and ankle minimal pitting vs non pitting edema, without erythema, non tender, not extending into calf, both legs otherwise symmetrical). She exhibits no tenderness.       Left lower leg: She exhibits no tenderness and no edema.  Lymphadenopathy:    She has no cervical adenopathy.  Neurological: She is alert and oriented to person, place, and time.  Skin: Skin is warm and dry. No rash noted. She is not diaphoretic. No erythema.  Right Anterior Chest Wall: - midline under clavicle, mobile 2 cm approx soft nodule, non tender, no surrounding skin change, similar in past 6 months without change  Psychiatric: She has a normal mood and affect. Her behavior is normal.  Well groomed, good eye contact, normal speech and thoughts  Nursing note and vitals reviewed.  Results for orders placed or performed in visit on 10/13/17  VITAMIN D 25 Hydroxy (Vit-D Deficiency, Fractures)  Result Value Ref Range   Vit D, 25-Hydroxy 19 (L) 30 - 100 ng/mL  CBC with Differential/Platelet  Result Value Ref Range   WBC 5.0 3.8 - 10.8 Thousand/uL   RBC 4.58 3.80 - 5.10 Million/uL   Hemoglobin 13.6 11.7 - 15.5 g/dL   HCT 40.1 35.0 - 45.0 %   MCV 87.6 80.0 - 100.0 fL   MCH 29.7 27.0 - 33.0 pg   MCHC 33.9 32.0 - 36.0 g/dL   RDW 13.5 11.0 - 15.0 %   Platelets 189 140 - 400 Thousand/uL   MPV 11.9 7.5 - 12.5 fL   Neutro Abs 3,530 1,500 - 7,800 cells/uL   Lymphs Abs 890 850 - 3,900 cells/uL   WBC  mixed population 480 200 - 950 cells/uL   Eosinophils Absolute 80 15 - 500 cells/uL   Basophils Absolute 20 0 - 200 cells/uL   Neutrophils Relative % 70.6 %   Total Lymphocyte 17.8 %   Monocytes Relative 9.6 %   Eosinophils Relative 1.6 %   Basophils Relative 0.4 %  Lipid panel  Result Value Ref Range   Cholesterol 203 (H) <200 mg/dL   HDL 53 >50 mg/dL   Triglycerides 280 (H) <150 mg/dL   LDL Cholesterol (Calc) 110 (H) mg/dL (calc)   Total CHOL/HDL Ratio 3.8 <5.0 (calc)   Non-HDL Cholesterol (Calc) 150 (H) <130 mg/dL (calc)  Hemoglobin A1c  Result Value Ref Range  Hgb A1c MFr Bld 6.0 (H) <5.7 % of total Hgb   Mean Plasma Glucose 126 (calc)   eAG (mmol/L) 7.0 (calc)  COMPLETE METABOLIC PANEL WITH GFR  Result Value Ref Range   Glucose, Bld 172 (H) 65 - 99 mg/dL   BUN 12 7 - 25 mg/dL   Creat 0.69 0.60 - 0.93 mg/dL   GFR, Est Non African American 86 > OR = 60 mL/min/1.51m2   GFR, Est African American 100 > OR = 60 mL/min/1.44m2   BUN/Creatinine Ratio NOT APPLICABLE 6 - 22 (calc)   Sodium 137 135 - 146 mmol/L   Potassium 4.0 3.5 - 5.3 mmol/L   Chloride 98 98 - 110 mmol/L   CO2 31 20 - 32 mmol/L   Calcium 9.7 8.6 - 10.4 mg/dL   Total Protein 6.7 6.1 - 8.1 g/dL   Albumin 4.2 3.6 - 5.1 g/dL   Globulin 2.5 1.9 - 3.7 g/dL (calc)   AG Ratio 1.7 1.0 - 2.5 (calc)   Total Bilirubin 0.8 0.2 - 1.2 mg/dL   Alkaline phosphatase (APISO) 72 33 - 130 U/L   AST 25 10 - 35 U/L   ALT 19 6 - 29 U/L      Assessment & Plan:   Problem List Items Addressed This Visit    Dyspnea - Primary   Relevant Orders   ECHOCARDIOGRAM COMPLETE   Ambulatory referral to Cardiology   Essential hypertension   Relevant Orders   ECHOCARDIOGRAM COMPLETE   Ambulatory referral to Cardiology    Other Visit Diagnoses    Bilateral lower extremity edema       Relevant Orders   ECHOCARDIOGRAM COMPLETE   Ambulatory referral to Cardiology    Persistent dyspnea on exertion and some other associated respiratory  symptoms with LE edema episodic Clinically appears cardiopulmonary etiology, possibly given history of former smoker and prior bronchitis with episodic wheezing suspect she may actually have COPD diagnosis, last CXR did not confirm this appearance. - however she does have cardiac risk factors and is interested to pursue further heart testing to rule this out first before further testing her lungs - Clinically less concern for DVT, Well's Score 0 (low risk, unlikely), edema most likely is from venous insufficiency and obesity, seems more pedal than lower leg  Plan - Referral to return to Cardiology to see Dr Fletcher Anon [facility] [place] for re-evaluation of dyspnea on exertion and leg swelling, I have ordered ECHO already to be done before appointment, she had prior work-up negative in 2014 by Dr Fletcher Anon - If she is cleared by cardiology after further evaluation then we would next pursue Pulmonology with PFTs and trial of maintenance inhaler for potential COPD  She is aware of strict return criteria if chest pain or acute dyspnea, when to go to hospital ---------------------------------------------------    Nodule of right anterior chest wall     Clinically uncertain etiology, may be lymph node or other lipoma or nodular density Persistent >1 year, episodic changes Will order [place] chest soft tissue to characterize this spot better - to be scheduled, will f/u results    Relevant Orders   [place] CHEST SOFT TISSUE         No orders of the defined types were placed in this encounter.   Orders Placed This Encounter  Procedures  . [place] CHEST SOFT TISSUE    Standing Status:   Future    Standing Expiration Date:   06/02/2019    Scheduling Instructions:  1-2 weeks, she will also have ECHOcardiogram ordered for different problem, requesting if can be scheduled same day    Order Specific Question:   Reason for Exam (SYMPTOM  OR DIAGNOSIS REQUIRED)    Answer:   right upper chest wall under clavicle approx 2  cm mobile nodule episodic tender, >1 year, not within image of mammogram    Order Specific Question:   Preferred imaging location?    Answer:   [facility]  . Ambulatory referral to Cardiology    Referral Priority:   Routine    Referral Type:   Consultation    Referral Reason:   Specialty Services Required    Referred to Provider:   Wellington Hampshire, MD    Requested Specialty:   Cardiology    Number of Visits Requested:   1  . ECHOCARDIOGRAM COMPLETE    Standing Status:   Future    Standing Expiration Date:   07/03/2019    Scheduling Instructions:     1-2 weeks, she will also have Right chest wall ultrasound ordered for different problem, requesting if can be scheduled same day    Order Specific Question:   Where should this test be performed    Answer:   [facility]    Order Specific Question:   Please indicate who you request to read the echo results.    Answer:   [facility]    Order Specific Question:   Perflutren DEFINITY (image enhancing agent) should be administered unless hypersensitivity or allergy exist    Answer:   Administer Perflutren    Order Specific Question:   Expected Date:    Answer:   1 week     Follow up plan: Return in about 8 weeks (around 05/27/2018) for f/u when completed testing and cardiology - 8 wk to 3 months.  Nobie Putnam, [facility] 04/01/2018, 1:35 PM

## 2018-04-01 NOTE — Patient Instructions (Addendum)
Thank you for coming to the office today.  We have ordered Ultrasound of nodule for R upper chest - stay tuned - try to schedule same day  Referral to Cardiology  - stay tuned - I have ordered ECHOcardiogram to be done BEFORE your apt  Dr [facility]) HeartCare at [place], [place] [address] [address]: [phone]   -----------------   Please schedule a Follow-up Appointment to: Return in about 8 weeks (around 05/27/2018) for f/u when completed testing and cardiology - 8 wk to 3 months.  If you have any other questions or concerns, please feel free to call the office or send a message through [place]. You may also schedule an earlier appointment if necessary.  Additionally, you may be receiving a survey about your experience at our office within a few days to 1 week by e-mail or mail. We value your feedback.  Nobie Putnam, DO [place]

## 2018-04-02 ENCOUNTER — Telehealth: Payer: Self-pay | Admitting: Family Medicine

## 2018-04-02 NOTE — Telephone Encounter (Signed)
Patient appointment is scheduled on 07/05 for echocardiogram at 11:00 am needed to at [facility] by 10:45 am patient is aware about time.

## 2018-04-10 ENCOUNTER — Ambulatory Visit
Admission: RE | Admit: 2018-04-10 | Discharge: 2018-04-10 | Disposition: A | Discharge status: Home / Self Care | Payer: Medicare HMO | Source: Ambulatory Visit | Attending: Family Medicine | Admitting: Family Medicine

## 2018-04-10 ENCOUNTER — Ambulatory Visit (HOSPITAL_BASED_OUTPATIENT_CLINIC_OR_DEPARTMENT_OTHER)
Admission: RE | Admit: 2018-04-10 | Discharge: 2018-04-10 | Disposition: A | Discharge status: Home / Self Care | Payer: Medicare HMO | Source: Ambulatory Visit | Attending: Family Medicine | Admitting: Family Medicine

## 2018-04-10 DIAGNOSIS — R0600 Dyspnea, unspecified: Secondary | ICD-10-CM

## 2018-04-10 DIAGNOSIS — I10 Essential (primary) hypertension: Secondary | ICD-10-CM | POA: Insufficient documentation

## 2018-04-10 DIAGNOSIS — R222 Localized swelling, mass and lump, trunk: Secondary | ICD-10-CM | POA: Insufficient documentation

## 2018-04-10 DIAGNOSIS — R600 Localized edema: Secondary | ICD-10-CM | POA: Diagnosis not present

## 2018-04-10 DIAGNOSIS — R0609 Other forms of dyspnea: Secondary | ICD-10-CM | POA: Diagnosis not present

## 2018-04-10 NOTE — Progress Notes (Signed)
*  PRELIMINARY RESULTS* Echocardiogram 2D Echocardiogram has been performed.  Yvette [name] 04/10/2018, 11:33 AM

## 2018-05-27 ENCOUNTER — Ambulatory Visit: Payer: Medicare HMO | Admitting: Family Medicine

## 2018-06-04 ENCOUNTER — Ambulatory Visit: Payer: Medicare HMO | Admitting: Cardiovascular Disease

## 2018-06-04 ENCOUNTER — Encounter: Payer: Self-pay | Admitting: Cardiovascular Disease

## 2018-06-04 VITALS — BP 136/84 | HR 71 | Ht 66.0 in | Wt 241.5 lb

## 2018-06-04 DIAGNOSIS — E119 Type 2 diabetes mellitus without complications: Secondary | ICD-10-CM | POA: Diagnosis not present

## 2018-06-04 DIAGNOSIS — I10 Essential (primary) hypertension: Secondary | ICD-10-CM | POA: Diagnosis not present

## 2018-06-04 DIAGNOSIS — R0602 Shortness of breath: Secondary | ICD-10-CM | POA: Diagnosis not present

## 2018-06-04 NOTE — Patient Instructions (Signed)
Medication Instructions: Your physician recommends that you continue on your current medications as directed. Please refer to the Current Medication list given to you today.  If you need a refill on your cardiac medications before your next appointment, please call your pharmacy.   Procedures/Testing: Your physician has requested that you have a lexiscan myoview. For further information please visit [email or web address]. Please follow instruction sheet, as given.    Follow-Up: Your physician wants you to follow-up as needed with Dr. Fletcher Anon.   Special Instructions:  [place]  Your provider has ordered a Stress Test with nuclear imaging. The purpose of this test is to evaluate the blood supply to your heart muscle. This procedure is referred to as a "Non-Invasive Stress Test." This is because other than having an IV started in your vein, nothing is inserted or "invades" your body. Cardiac stress tests are done to find areas of poor blood flow to the heart by determining the extent of coronary artery disease (CAD). Some patients exercise on a treadmill, which naturally increases the blood flow to your heart, while others who are unable to walk on a treadmill due to physical limitations have a pharmacologic/chemical stress agent called Lexiscan . This medicine will mimic walking on a treadmill by temporarily increasing your coronary blood flow.   Please note: these test may take anywhere between 2-4 hours to complete  PLEASE REPORT TO [place] AT THE FIRST DESK WILL DIRECT YOU WHERE TO GO  Date of Procedure:_____________________________________  Arrival Time for Procedure:______________________________  Instructions regarding medication:   __x__:  Hold other medications as follows: None to hold   PLEASE NOTIFY THE OFFICE AT LEAST 24 HOURS IN ADVANCE IF YOU ARE UNABLE TO KEEP YOUR APPOINTMENT.  [phone] AND  PLEASE NOTIFY NUCLEAR MEDICINE AT [facility]  AT LEAST 24 HOURS IN ADVANCE IF YOU ARE UNABLE TO KEEP YOUR APPOINTMENT. [phone]  How to prepare for your Myoview test:  1. Do not eat or drink after midnight 2. No caffeine for 24 hours prior to test 3. No smoking 24 hours prior to test. 4. Your medication may be taken with water.  If your doctor stopped a medication because of this test, do not take that medication. 5. Ladies, please do not wear dresses.  Skirts or pants are appropriate. Please wear a short sleeve shirt. 6. No perfume, cologne or lotion. 7. Wear comfortable walking shoes. No heels!     Thank you for choosing Heartcare at [facility]!

## 2018-06-04 NOTE — Progress Notes (Signed)
eg

## 2018-06-04 NOTE — Progress Notes (Signed)
Cardiology Office Note   Date:  06/04/2018   ID:  Yvette [name], Yvette [name] Apr 22, 1946, MRN [id number]  PCP:  Olin Hauser, DO  Cardiologist:   Kathlyn Sacramento, MD   Chief Complaint  Patient presents with  . other    Ref by Dr. Parks Ranger for shortness of breath on exertion. Pt. c/o shortness of breath with exertion and has LE edema.       History of Present Illness: Yvette [name] is a 72 y.o. female who was referred by Dr. Parks Ranger for evaluation of exertional dyspnea.  She was seen by me in 2014 and underwent a treadmill nuclear stress test which showed no evidence of ischemia with normal ejection fraction. She has known history of hypertension, hyperlipidemia, diet-controlled diabetes and GERD.  She is referred back for worsening exertional dyspnea.  She had acute respiratory symptoms in May and was treated with doxycycline, prednisone and inhalers.  She did have lingering cough afterwards her symptoms overall improved. However, she recently started having worsening exertional dyspnea and heaviness.  She has been under stress recently trying to sell her property.  She gained some weight.  She is a former smoker and quit more than 30 years ago.  She underwent an echocardiogram in July which showed an EF of 60 to 65% with grade 1 diastolic dysfunction and no significant valvular abnormalities.  Pulmonary pressure could not be estimated.  Past Medical History:  Diagnosis Date  . Arthropathy, unspecified, site unspecified   . Esophageal reflux   . Osteoporosis, unspecified   . Other abnormal glucose   . Other and unspecified hyperlipidemia   . Symptomatic menopausal or female climacteric states   . Syncope and collapse   . Unspecified essential hypertension     Past Surgical History:  Procedure Laterality Date  . ABDOMINAL HYSTERECTOMY    . APPENDECTOMY    . BREAST BIOPSY Left 1970's   neg  . COLONOSCOPY    . FINGER FRACTURE SURGERY       Current  Outpatient Medications  Medication Sig Dispense Refill  . acetaminophen (TYLENOL) 500 MG tablet Take 1,000 mg by mouth as needed.    Marland Kitchen albuterol (PROVENTIL HFA;VENTOLIN HFA) 108 (90 Base) MCG/ACT inhaler Inhale 2 puffs into the lungs every 4 (four) hours as needed for wheezing or shortness of breath (cough). 1 Inhaler 0  . amLODipine (NORVASC) 10 MG tablet Take 1 tablet (10 mg total) by mouth daily. 30 tablet 5  . aspirin 81 MG tablet Take 81 mg by mouth daily.    . beta carotene w/minerals (OCUVITE) tablet Take 1 tablet by mouth daily.    . Calcium Carb-Cholecalciferol (CALTRATE 600+D) 600-800 MG-UNIT TABS Take 2 tablets by mouth daily.    . Cholecalciferol (VITAMIN D3) 5000 units CAPS Take 1 capsule (5,000 Units total) by mouth daily. For 8 weeks, then start Vitamin D3 2,000 units daily (OTC) 30 capsule 0  . fluticasone (FLONASE) 50 MCG/ACT nasal spray Place 2 sprays into both nostrils daily. 16 g 1  . hydrochlorothiazide (HYDRODIURIL) 12.5 MG tablet Take 1 tablet (12.5 mg total) by mouth daily. 30 tablet 5  . ketoconazole (NIZORAL) 2 % cream     . Krill Oil 300 MG CAPS Take by mouth daily.    Marland Kitchen lisinopril (PRINIVIL,ZESTRIL) 40 MG tablet Take 1 tablet (40 mg total) by mouth daily. 90 tablet 3  . loratadine (CLARITIN) 10 MG tablet Take 10 mg by mouth daily.    . Multiple Vitamin (MULTIVITAMIN)  tablet Take 1 tablet by mouth daily.    . naproxen (NAPROSYN) 250 MG tablet     . pantoprazole (PROTONIX) 40 MG tablet Take 1 tablet (40 mg total) by mouth daily before breakfast. 90 tablet 3  . raloxifene (EVISTA) 60 MG tablet Take 1 tablet (60 mg total) by mouth daily. 90 tablet 3  . scopolamine (TRANSDERM-SCOP) 1 MG/3DAYS Place 1 patch (1.5 mg total) onto the skin every 3 (three) days. For motion sickness. Start 2 hr before onset symptoms, up to 12 hr before 10 patch 1   No current facility-administered medications for this visit.     Allergies:   Simvastatin and Erythromycin    Social History:   The patient  reports that she quit smoking about 26 years ago. Her smoking use included cigarettes. She has a 26.00 pack-year smoking history. She has never used smokeless tobacco. She reports that she drinks alcohol. She reports that she does not use drugs.   Family History:  The patient's family history includes Diabetes in her mother; Gallstones in her father; Heart attack in her brother and mother; Heart disease in her mother; Ulcers in her father.    ROS:  Please see the history of present illness.   Otherwise, review of systems are positive for none.   All other systems are reviewed and negative.    PHYSICAL EXAM: VS:  BP 136/84 (BP Location: Right Arm, Patient Position: Sitting, Cuff Size: Large)   Pulse 71   Ht 5\' 6"  (1.676 m)   Wt 241 lb 8 oz (109.5 kg)   BMI 38.98 kg/m  , BMI Body mass index is 38.98 kg/m. GEN: Well nourished, well developed, in no acute distress  HEENT: normal  Neck: no JVD, carotid bruits, or masses Cardiac: RRR; no murmurs, rubs, or gallops,no edema  Respiratory:  clear to auscultation bilaterally, normal work of breathing GI: soft, nontender, nondistended, + BS MS: no deformity or atrophy  Skin: warm and dry, no rash Neuro:  Strength and sensation are intact Psych: euthymic mood, full affect   EKG:  EKG is ordered today. The ekg ordered today demonstrates sinus rhythm with possible old inferior infarct.  This pattern was present on previous EKG.  Incomplete right bundle branch block.   Recent Labs: 10/13/2017: ALT 19; BUN 12; Creat 0.69; Hemoglobin 13.6; Platelets 189; Potassium 4.0; Sodium 137    Lipid Panel    Component Value Date/Time   CHOL 203 (H) 10/13/2017 0835   TRIG 280 (H) 10/13/2017 0835   HDL 53 10/13/2017 0835   CHOLHDL 3.8 10/13/2017 0835   VLDL 59 (H) 10/16/2016 0001   LDLCALC 110 (H) 10/13/2017 0835      Wt Readings from Last 3 Encounters:  06/04/18 241 lb 8 oz (109.5 kg)  04/01/18 242 lb (109.8 kg)  02/06/18 239 lb  (108.4 kg)       PAD Screen 06/04/2018  Previous PAD dx? No  Previous surgical procedure? No  Pain with walking? Yes  Subsides with rest? Yes  Feet/toe relief with dangling? No  Painful, non-healing ulcers? No  Extremities discolored? No      ASSESSMENT AND PLAN:  1.  Exertional dyspnea: Certainly physical deconditioning is a possibility given that she does not do much exercise and she is not very active.  I do not see clear evidence of heart failure and recent echocardiogram was overall reassuring.  I do think we have to exclude underlying ischemic heart disease and thus I recommend evaluation with a  pharmacologic nuclear stress test.  She is not able to exercise on a treadmill due to back pain and arthritis.  2.  Essential hypertension: Blood pressure is reasonably controlled on current medications.    Disposition:   FU with me as needed.   Signed,  Kathlyn Sacramento, MD  06/04/2018 4:32 PM    [facility]

## 2018-06-15 ENCOUNTER — Ambulatory Visit: Payer: Medicare HMO | Admitting: Family Medicine

## 2018-06-16 ENCOUNTER — Encounter
Admission: RE | Admit: 2018-06-16 | Discharge: 2018-06-16 | Disposition: A | Discharge status: Home / Self Care | Payer: Medicare HMO | Source: Ambulatory Visit | Attending: Cardiovascular Disease | Admitting: Cardiovascular Disease

## 2018-06-16 DIAGNOSIS — R0602 Shortness of breath: Secondary | ICD-10-CM

## 2018-06-16 LAB — NM MYOCAR MULTI W/SPECT W/WALL MOTION / EF
LV dias vol: 82 mL (ref 46–106)
LV sys vol: 28 mL
Peak HR: 88 {beats}/min
Percent HR: 60 %
Rest HR: 66 {beats}/min
TID: 1.02

## 2018-06-16 MED ORDER — TECHNETIUM TC 99M TETROFOSMIN IV KIT
31.8900 | PACK | Freq: Once | INTRAVENOUS | Status: AC | PRN
  Administered 2018-06-16: 31.89 via INTRAVENOUS

## 2018-06-16 MED ORDER — TECHNETIUM TC 99M TETROFOSMIN IV KIT
14.2700 | PACK | Freq: Once | INTRAVENOUS | Status: AC | PRN
  Administered 2018-06-16: 14.27 via INTRAVENOUS

## 2018-06-16 MED ORDER — REGADENOSON 0.4 MG/5ML IV SOLN
0.4000 mg | Freq: Once | INTRAVENOUS | Status: AC
  Administered 2018-06-16: 0.4 mg via INTRAVENOUS

## 2018-07-13 ENCOUNTER — Ambulatory Visit (INDEPENDENT_AMBULATORY_CARE_PROVIDER_SITE_OTHER): Payer: Medicare HMO | Admitting: Family Medicine

## 2018-07-13 ENCOUNTER — Encounter: Payer: Self-pay | Admitting: Family Medicine

## 2018-07-13 ENCOUNTER — Other Ambulatory Visit: Payer: Self-pay | Admitting: Family Medicine

## 2018-07-13 VITALS — BP 137/70 | HR 83 | Temp 98.2°F | Resp 16 | Ht 66.0 in | Wt 241.0 lb

## 2018-07-13 DIAGNOSIS — Z789 Other specified health status: Secondary | ICD-10-CM

## 2018-07-13 DIAGNOSIS — R0609 Other forms of dyspnea: Secondary | ICD-10-CM | POA: Diagnosis not present

## 2018-07-13 DIAGNOSIS — K219 Gastro-esophageal reflux disease without esophagitis: Secondary | ICD-10-CM

## 2018-07-13 DIAGNOSIS — R635 Abnormal weight gain: Secondary | ICD-10-CM

## 2018-07-13 DIAGNOSIS — R0600 Dyspnea, unspecified: Secondary | ICD-10-CM

## 2018-07-13 DIAGNOSIS — R7303 Prediabetes: Secondary | ICD-10-CM

## 2018-07-13 DIAGNOSIS — I10 Essential (primary) hypertension: Secondary | ICD-10-CM

## 2018-07-13 DIAGNOSIS — Z23 Encounter for immunization: Secondary | ICD-10-CM

## 2018-07-13 DIAGNOSIS — E669 Obesity, unspecified: Secondary | ICD-10-CM

## 2018-07-13 DIAGNOSIS — J3089 Other allergic rhinitis: Secondary | ICD-10-CM

## 2018-07-13 DIAGNOSIS — Z0000 Encounter for general adult medical examination without abnormal findings: Secondary | ICD-10-CM

## 2018-07-13 DIAGNOSIS — F419 Anxiety disorder, unspecified: Secondary | ICD-10-CM

## 2018-07-13 DIAGNOSIS — R29818 Other symptoms and signs involving the nervous system: Secondary | ICD-10-CM | POA: Diagnosis not present

## 2018-07-13 DIAGNOSIS — E7849 Other hyperlipidemia: Secondary | ICD-10-CM

## 2018-07-13 DIAGNOSIS — Z87891 Personal history of nicotine dependence: Secondary | ICD-10-CM

## 2018-07-13 DIAGNOSIS — M47816 Spondylosis without myelopathy or radiculopathy, lumbar region: Secondary | ICD-10-CM

## 2018-07-13 DIAGNOSIS — E559 Vitamin D deficiency, unspecified: Secondary | ICD-10-CM

## 2018-07-13 MED ORDER — FLUTICASONE PROPIONATE 50 MCG/ACT NA SUSP: 2.0000 | Freq: Every day | NASAL | 5 refills | Status: AC

## 2018-07-13 NOTE — Progress Notes (Signed)
Subjective:    Patient ID: Yvette [name], female    DOB: [birth year], 72 y.o.   MRN: [id number]  Yvette [name] is a 72 y.o. female presenting on 07/13/2018 for Shortness of Breath (follow up after cardiologist appointment)   HPI  Follow-up Episodic Exertional Dyspnea / Obesity BMI >38 Last visit with me 03/2018 and has contacted [place] via mychart, she has been worked up for dyspnea on exertion, initially referred to Cardiology Dr Fletcher Anon returned for evaluation, had ECHO and Nuclear stress test that have been completed already, last stress done 06/16/18, see report below, low risk. She was given reassurance from Cardiology and f/u PRN at that time. Thought some deconditioning and weight gain likely - Today she admits no new concerns. She is fine at rest, can breath good but if exerting herself has some dyspnea. She does feel weight gain, and hotter humid weather affecting her breathing more, she feels like she can breathe better in cooler weather, also she has dyspnea on exertion if walk excessive amount or large amount of stairs, and has not resumed regularly walking exercise but plans to restart with a friend locally. She thinks if lose some wt will breathe better as well. - Thinks some seasonal, takes allergy pill daily. Uses OTC nasal saline regularly. No longer using Flonase - No prior dx of COPD. Former smoker, see below - She has not seen Pulmonology or used any inhaler, asking today about what to do next - See prior note for additional background information Denies active chest pain or pressure, dyspnea, wheezing, sweating, nausea vomiting, productive cough, fever chills  Former Smoker Prior history smoker up to 1ppd >30 years, quit about 30 years ago. But has significant history of 2nd hand smoke as well No prior dx COPD. No recent lung imaging. Interested in low dose CT lung CA screening  Suspected Sleep Apnea - Uncertain if sleep apnea symptoms, she states concern from friends who use  CPAP , asking if she needs sleep study, was told she snores and has some symptoms at night, but not witnessed apnea. She admits daytime sleepiness and tiredness, see scores below  Epworth Sleepiness Scale Total Score: 10 Sitting and reading - 2 Watching TV - 2 Sitting inactive in a public place - 0 As a passenger in a car for an hour without a break - 1 Lying down to rest in the afternoon when circumstances permit - 3 Sitting and talking to someone - 0 Sitting quietly after a lunch without alcohol - 2 In a car, while stopped for a few minutes in traffic - 0  STOP-Bang OSA scoring Snoring yes   Tiredness yes   Observed apneas no   Pressure HTN yes   BMI > 35 kg/m2 yes   Age > 36  yes   Neck (female >17 in; Female >16 in)  yes 16.5"  Gender female no   OSA risk low (0-2)  OSA risk intermediate (3-4)  OSA risk high (5+)  Total: 6 High Risk   Health Maintenance: Due for Flu Shot, high dose, will receive today    Depression screen [facility] 2/9 07/13/2018 04/01/2018 10/15/2017  Decreased Interest 0 0 0  Down, Depressed, Hopeless 0 0 0  PHQ - 2 Score 0 0 0  Altered sleeping - - -  Tired, decreased energy - - -  Change in appetite - - -  Feeling bad or failure about yourself  - - -  Trouble concentrating - - -  Moving  slowly or fidgety/restless - - -  Suicidal thoughts - - -  PHQ-9 Score - - -  Difficult doing work/chores - - -    Social History   Tobacco Use  . Smoking status: Former Smoker    Packs/day: 1.00    Years: 26.00    Pack years: 26.00    Types: Cigarettes    Last attempt to quit: 06/07/1992    Years since quitting: 26.1  . Smokeless tobacco: Never Used  Substance Use Topics  . Alcohol use: Yes    Alcohol/week: 0.0 standard drinks    Comment: occassional  . Drug use: No    Review of Systems Per HPI unless specifically indicated above     Objective:    BP 137/70   Pulse 83   Temp 98.2 F (36.8 C) (Oral)   Resp 16   Ht 5\' 6"  (1.676 m)   Wt 241 lb (109.3  kg)   BMI 38.90 kg/m   Wt Readings from Last 3 Encounters:  07/13/18 241 lb (109.3 kg)  06/04/18 241 lb 8 oz (109.5 kg)  04/01/18 242 lb (109.8 kg)    Physical Exam  Constitutional: She is oriented to person, place, and time. She appears well-developed and well-nourished. No distress.  Well-appearing, comfortable, cooperative, obese  HENT:  Head: Normocephalic and atraumatic.  Mouth/Throat: Oropharynx is clear and moist.  Eyes: Conjunctivae are normal. Right eye exhibits no discharge. Left eye exhibits no discharge.  Neck: Normal range of motion. Neck supple. No thyromegaly present.  Cardiovascular: Normal rate, regular rhythm, normal heart sounds and intact distal pulses.  No murmur heard. Pulmonary/Chest: Effort normal and breath sounds normal. No respiratory distress. She has no wheezes. She has no rales.  Good air movement. Speaks full sentences.  Musculoskeletal: Normal range of motion. She exhibits no edema.  Lymphadenopathy:    She has no cervical adenopathy.  Neurological: She is alert and oriented to person, place, and time.  Skin: Skin is warm and dry. No rash noted. She is not diaphoretic. No erythema.  Psychiatric: She has a normal mood and affect. Her behavior is normal.  Well groomed, good eye contact, normal speech and thoughts  Nursing note and vitals reviewed.     I have personally reviewed the Cardiology interpretation report on 06/16/18 Nuclear Stress Scan.   Low risk, probably normal pharmacologic myocardial perfusion stress test.  There is a small in size, mild in severity, fixed apical defect that most likely represents artifact and less likely scar.  There is no significant ischemia.  The left ventricular ejection fraction is normal (65%) with normal regional wall motion.   Dr Fletcher Anon   Results for orders placed or performed in visit on 06/04/18  NM Myocar Multi W/Spect W/Wall Motion / EF  Result Value Ref Range   Rest HR 66 bpm   Rest BP 152/84  mmHg   Percent HR 60 %   Peak HR 88 bpm   Peak BP 140/80 mmHg   TID 1.02    LV sys vol 28 mL   LV dias vol 82 46 - 106 mL      Assessment & Plan:   Problem List Items Addressed This Visit    Allergic rhinosinusitis    Seasonal, now affecting her sinus/breathing, worse at night Off Flonase now Using nasal saline PRN  Restart nasal steroid Flonase 2 sprays in each nostril daily for 4-6 weeks, may repeat course seasonally or as needed       Relevant  Medications   fluticasone (FLONASE) 50 MCG/ACT nasal spray   Dyspnea - Primary    Clinically stable, now with persistent DOE only on notable exertion Essentially ruled out cardiac etiology, with [facility] Cards Dr Fletcher Anon work-up recently had nuclear stress and ECHO, has had vascular studies Former smoker, possible early/mild COPD underlying - to be determined Uncertain if possible OSA Likely multifactorial problem with obesity and deconditioning physically  Plan - Reassurance from Cardiac work-up - Now proceed with improving daily exercise walking, improve exercise tolerance, see if wt loss and improve conditioning - Offered refer to Pulm at this time, we agree to defer for short term / hold PFTs as well, also offered daily maintenance inhaler such as a spiriva vs ICS - we will defer these options for now, if she notifies me via mychart or call or come back sooner if not improving we can proceed with one of these options - Proceed with PSG testing for possible OSA now, send referral - Follow-up 3 months  Return criteria given if acute worsening symptoms      Former smoker    See A&P Prior concern former smoker >30 yr, quit 30 yr ago, and 2nd hand smoke May have underlying COPD  Discussed option for screening imaging with Low Dose CT Chest - send info to request scheduling if she meets criteria for this at [facility]      Obesity (BMI 35.0-39.9 without comorbidity)    Recent persistent wt gain abnormal Concern with obesity BMI  >38 Likely factor for DOE  Restart walking, wt loss strategy, diet improve Follow-up      Suspected sleep apnea    Persistent clinical concern for suspected obstructive sleep apnea given reported symptoms snoring and sleep disturbance, fatigue / excessive sleepiness. - Screening: ESS score 10 / STOP-Bang Score 6 - Neck Circumference: 16.5" - Co-morbidities: HTN, Obesity  Plan: 1. Discussion on initial diagnosis and testing for OSA, risk factors, management, complications 2. Agree to proceed with sleep study testing based on clinical concerns - referral sent to [place] - stay tuned       Other Visit Diagnoses    Needs flu shot       Relevant Orders   Flu vaccine HIGH DOSE PF (Completed)      Meds ordered this encounter  Medications  . fluticasone (FLONASE) 50 MCG/ACT nasal spray    Sig: Place 2 sprays into both nostrils daily.    Dispense:  16 g    Refill:  5     Follow up plan: Return in about 3 months (around 10/13/2018) for Annual Physical (f/u dyspnea - ?pulm).  Future labs ordered for 10/08/18  Order to be faxed today to [place].  Nobie Putnam, [facility] 07/13/2018, 3:21 PM

## 2018-07-13 NOTE — Patient Instructions (Addendum)
Thank you for coming to the office today.  Referral sent to [place] stay tuned for further information from them on Home or Center Sleep Study  Continue to stay active and start back walking, and continue with plan as discussed  If at any point worsening or need to speak with Pulmonology lung specialist and proceed with breathing testing let me know we can refer.  If you want to try a daily inhaler for COPD to test it out we can send this to pharmacy, let me know.  Low Dose Chest CT Lung CA Screening - Age 63-74 - Smoking history >30 pack years - Annual screening for 15 years after quit date  [facility]) Burgess Estelle, RN, [place] Smoking Cessation Class Ph: [phone]   DUE for FASTING BLOOD WORK (no food or drink after midnight before the lab appointment, only water or coffee without cream/sugar on the morning of)  SCHEDULE "Lab Only" visit in the morning at the clinic for lab draw in 3 MONTHS   - Make sure Lab Only appointment is at about 1 week before your next appointment, so that results will be available  For Lab Results, once available within 2-3 days of blood draw, you can can log in to MyChart online to view your results and a brief explanation. Also, we can discuss results at next follow-up visit.   Please schedule a Follow-up Appointment to: Return in about 3 months (around 10/13/2018) for Annual Physical (f/u dyspnea - ?pulm).  If you have any other questions or concerns, please feel free to call the office or send a message through [place]. You may also schedule an earlier appointment if necessary.  Additionally, you may be receiving a survey about your experience at our office within a few days to 1 week by e-mail or mail. We value your feedback.  Nobie Putnam, DO [place]

## 2018-07-13 NOTE — Assessment & Plan Note (Signed)
Persistent clinical concern for suspected obstructive sleep apnea given reported symptoms snoring and sleep disturbance, fatigue / excessive sleepiness. - Screening: ESS score 10 / STOP-Bang Score 6 - Neck Circumference: 16.5" - Co-morbidities: HTN, Obesity  Plan: 1. Discussion on initial diagnosis and testing for OSA, risk factors, management, complications 2. Agree to proceed with sleep study testing based on clinical concerns - referral sent to [place] - stay tuned

## 2018-07-13 NOTE — Assessment & Plan Note (Signed)
Seasonal, now affecting her sinus/breathing, worse at night Off Flonase now Using nasal saline PRN  Restart nasal steroid Flonase 2 sprays in each nostril daily for 4-6 weeks, may repeat course seasonally or as needed

## 2018-07-13 NOTE — Assessment & Plan Note (Signed)
Recent persistent wt gain abnormal Concern with obesity BMI >38 Likely factor for DOE  Restart walking, wt loss strategy, diet improve Follow-up

## 2018-07-13 NOTE — Assessment & Plan Note (Signed)
See A&P Prior concern former smoker >30 yr, quit 30 yr ago, and 2nd hand smoke May have underlying COPD  Discussed option for screening imaging with Low Dose CT Chest - send info to request scheduling if she meets criteria for this at [facility]

## 2018-07-13 NOTE — Assessment & Plan Note (Addendum)
Clinically stable, now with persistent DOE only on notable exertion Essentially ruled out cardiac etiology, with [facility] Cards Dr Fletcher Anon work-up recently had nuclear stress and ECHO, has had vascular studies Former smoker, possible early/mild COPD underlying - to be determined Uncertain if possible OSA Likely multifactorial problem with obesity and deconditioning physically  Plan - Reassurance from Cardiac work-up - Now proceed with improving daily exercise walking, improve exercise tolerance, see if wt loss and improve conditioning - Offered refer to Pulm at this time, we agree to defer for short term / hold PFTs as well, also offered daily maintenance inhaler such as a spiriva vs ICS - we will defer these options for now, if she notifies me via mychart or call or come back sooner if not improving we can proceed with one of these options - Proceed with PSG testing for possible OSA now, send referral - Follow-up 3 months  Return criteria given if acute worsening symptoms

## 2018-07-28 DIAGNOSIS — L820 Inflamed seborrheic keratosis: Secondary | ICD-10-CM | POA: Diagnosis not present

## 2018-07-28 DIAGNOSIS — L821 Other seborrheic keratosis: Secondary | ICD-10-CM | POA: Diagnosis not present

## 2018-07-28 DIAGNOSIS — L720 Epidermal cyst: Secondary | ICD-10-CM | POA: Diagnosis not present

## 2018-09-08 ENCOUNTER — Other Ambulatory Visit: Payer: Self-pay | Admitting: Family Medicine

## 2018-09-08 DIAGNOSIS — I10 Essential (primary) hypertension: Secondary | ICD-10-CM

## 2018-10-08 ENCOUNTER — Other Ambulatory Visit: Payer: Medicare HMO

## 2018-10-08 DIAGNOSIS — E7849 Other hyperlipidemia: Secondary | ICD-10-CM | POA: Diagnosis not present

## 2018-10-08 DIAGNOSIS — E669 Obesity, unspecified: Secondary | ICD-10-CM | POA: Diagnosis not present

## 2018-10-08 DIAGNOSIS — R0609 Other forms of dyspnea: Secondary | ICD-10-CM

## 2018-10-08 DIAGNOSIS — R635 Abnormal weight gain: Secondary | ICD-10-CM | POA: Diagnosis not present

## 2018-10-08 DIAGNOSIS — Z0000 Encounter for general adult medical examination without abnormal findings: Secondary | ICD-10-CM | POA: Diagnosis not present

## 2018-10-08 DIAGNOSIS — R7303 Prediabetes: Secondary | ICD-10-CM

## 2018-10-08 DIAGNOSIS — Z789 Other specified health status: Secondary | ICD-10-CM | POA: Diagnosis not present

## 2018-10-08 DIAGNOSIS — E559 Vitamin D deficiency, unspecified: Secondary | ICD-10-CM

## 2018-10-08 DIAGNOSIS — R0600 Dyspnea, unspecified: Secondary | ICD-10-CM

## 2018-10-08 DIAGNOSIS — I10 Essential (primary) hypertension: Secondary | ICD-10-CM

## 2018-10-12 LAB — HEMOGLOBIN A1C
Hgb A1c MFr Bld: 6.6 % of total Hgb — ABNORMAL HIGH (ref <5.7)
Mean Plasma Glucose: 143 (calc)
eAG (mmol/L): 7.9 (calc)

## 2018-10-12 LAB — CBC WITH DIFFERENTIAL/PLATELET
Absolute Monocytes: 391 cells/uL (ref 200–950)
Basophils Absolute: 19 cells/uL (ref 0–200)
Basophils Relative: 0.5 %
Eosinophils Absolute: 49 cells/uL (ref 15–500)
Eosinophils Relative: 1.3 %
HCT: 38.7 % (ref 35.0–45.0)
Hemoglobin: 13.3 g/dL (ref 11.7–15.5)
Lymphs Abs: 961 cells/uL (ref 850–3900)
MCH: 30.2 pg (ref 27.0–33.0)
MCHC: 34.4 g/dL (ref 32.0–36.0)
MCV: 87.8 fL (ref 80.0–100.0)
MPV: 11.9 fL (ref 7.5–12.5)
Monocytes Relative: 10.3 %
Neutro Abs: 2379 cells/uL (ref 1500–7800)
Neutrophils Relative %: 62.6 %
Platelets: 171 10*3/uL (ref 140–400)
RBC: 4.41 10*6/uL (ref 3.80–5.10)
RDW: 13.7 % (ref 11.0–15.0)
Total Lymphocyte: 25.3 %
WBC: 3.8 10*3/uL (ref 3.8–10.8)

## 2018-10-12 LAB — MEASLES/MUMPS/RUBELLA IMMUNITY
Mumps IgG: <9 AU/mL — ABNORMAL LOW
Rubella: 16.4 index
Rubeola IgG: 29.9 AU/mL

## 2018-10-12 LAB — TSH: TSH: 3.99 mIU/L (ref 0.40–4.50)

## 2018-10-12 LAB — COMPLETE METABOLIC PANEL WITH GFR
AG Ratio: 2 (calc) (ref 1.0–2.5)
ALT: 27 U/L (ref 6–29)
AST: 36 U/L — ABNORMAL HIGH (ref 10–35)
Albumin: 4.2 g/dL (ref 3.6–5.1)
Alkaline phosphatase (APISO): 76 U/L (ref 33–130)
BUN: 13 mg/dL (ref 7–25)
CO2: 28 mmol/L (ref 20–32)
Calcium: 9.2 mg/dL (ref 8.6–10.4)
Chloride: 104 mmol/L (ref 98–110)
Creat: 0.62 mg/dL (ref 0.60–0.93)
GFR, Est African American: 103 mL/min/{1.73_m2} (ref >=60)
GFR, Est Non African American: 89 mL/min/{1.73_m2} (ref >=60)
Globulin: 2.1 g/dL (calc) (ref 1.9–3.7)
Glucose, Bld: 161 mg/dL — ABNORMAL HIGH (ref 65–99)
Potassium: 3.7 mmol/L (ref 3.5–5.3)
Sodium: 141 mmol/L (ref 135–146)
Total Bilirubin: 0.8 mg/dL (ref 0.2–1.2)
Total Protein: 6.3 g/dL (ref 6.1–8.1)

## 2018-10-12 LAB — LIPID PANEL
Cholesterol: 255 mg/dL — ABNORMAL HIGH (ref <200)
HDL: 49 mg/dL — ABNORMAL LOW (ref >50)
LDL Cholesterol (Calc): 166 mg/dL (calc) — ABNORMAL HIGH
Non-HDL Cholesterol (Calc): 206 mg/dL (calc) — ABNORMAL HIGH (ref <130)
Total CHOL/HDL Ratio: 5.2 (calc) — ABNORMAL HIGH (ref <5.0)
Triglycerides: 238 mg/dL — ABNORMAL HIGH (ref <150)

## 2018-10-12 LAB — VITAMIN D 25 HYDROXY (VIT D DEFICIENCY, FRACTURES): Vit D, 25-Hydroxy: 26 ng/mL — ABNORMAL LOW (ref 30–100)

## 2018-10-12 LAB — T4, FREE: Free T4: 1.1 ng/dL (ref 0.8–1.8)

## 2018-10-15 ENCOUNTER — Other Ambulatory Visit: Payer: Self-pay | Admitting: Family Medicine

## 2018-10-15 ENCOUNTER — Encounter: Payer: Self-pay | Admitting: Family Medicine

## 2018-10-15 ENCOUNTER — Ambulatory Visit (INDEPENDENT_AMBULATORY_CARE_PROVIDER_SITE_OTHER): Payer: Medicare HMO | Admitting: Family Medicine

## 2018-10-15 VITALS — BP 134/74 | HR 72 | Temp 97.8°F | Resp 16 | Ht 66.0 in | Wt 237.0 lb

## 2018-10-15 DIAGNOSIS — R7303 Prediabetes: Secondary | ICD-10-CM

## 2018-10-15 DIAGNOSIS — M159 Polyosteoarthritis, unspecified: Secondary | ICD-10-CM

## 2018-10-15 DIAGNOSIS — Z0000 Encounter for general adult medical examination without abnormal findings: Secondary | ICD-10-CM

## 2018-10-15 DIAGNOSIS — I10 Essential (primary) hypertension: Secondary | ICD-10-CM

## 2018-10-15 DIAGNOSIS — E7849 Other hyperlipidemia: Secondary | ICD-10-CM | POA: Diagnosis not present

## 2018-10-15 DIAGNOSIS — M150 Primary generalized (osteo)arthritis: Secondary | ICD-10-CM | POA: Diagnosis not present

## 2018-10-15 DIAGNOSIS — M8949 Other hypertrophic osteoarthropathy, multiple sites: Secondary | ICD-10-CM

## 2018-10-15 DIAGNOSIS — E559 Vitamin D deficiency, unspecified: Secondary | ICD-10-CM | POA: Diagnosis not present

## 2018-10-15 DIAGNOSIS — E669 Obesity, unspecified: Secondary | ICD-10-CM

## 2018-10-15 MED ORDER — VITAMIN D3 50 MCG (2000 UT) PO CAPS: 2000.0000 [IU] | ORAL_CAPSULE | Freq: Every day | ORAL | Status: DC

## 2018-10-15 NOTE — Assessment & Plan Note (Signed)
Clinically morbid obesity with BMI >38 with comorbid conditions Hypertension, Hyperlipidemia, Pre-Diabetes, Osteoporosis  With some recent mild weight loss Improved goals and strategy for wt loss diet primarily, low carb reduce portions, gradually increasing home exercise plan

## 2018-10-15 NOTE — Progress Notes (Signed)
Subjective:    Patient ID: Yvette [name], female    DOB: [birth year], 73 y.o.   MRN: [id number]  Yvette [name] is a 73 y.o. female presenting on 10/15/2018 for Annual Exam   HPI   Here for Annual Physical and Lab Review  Pre-Diabetes / Hyperglycemia / Morbid Obesity BMI >38 Last lab 10/2018 showed elevated A1c hyperglycemia up to 6.6, previously around A1c 6.0 - She holidays, affecting her, eating more sweets, past few years, also admits eating more breads and potatoes and starches - Recent inflammation flare with arthritis - improved exercises and returned to PT with some improvement CBGs: rarely checks CBG Meds: none Currently on ACEi, ASA 81, statin Lifestyle: - Diet: Poor food choices recently, carbs, sodas now and sweets, not as well hydrated - Exercise: Limited due to arthritis - she is more sedentary  HYPERLIPIDEMIA - Reports concerns with cholesterol, prior history elevated. Last lipid panel 10/2018, abnormal elevated TG, LDL - Previously on Simvastatin 20mg  had myalgias and muscle aches in past. Still has some weakness in legs at times.  CHRONIC HTN: Reportsnot checking BP at home regularly Current Meds -Amlodipine 10mg  daily, Lisinopril 40mg  daily, HCTZ 12.5mg  daily Reportsnormally good compliance.Tolerating well, w/o complaints. Improved urgency  Vitamin D Deficiency Improved from Vitamin D 19 up to 26, improved on Vit D 5k daily  Suspected Sleep Apnea She will contact [employer] to find out why PSG was not covered, [facility] was referred but they could not proceed. See last office visit with ESS, STOP-BANG Score - patient is high risk for OSA.   Health Maintenance: UTD Flu Vaccine UTD Pneumonia vaccine series UTD Cologuard 12/2016, good for 3 years Last mammogram UTD 10/24/17, negative bi rads 1. Fam history of breast cancer in mother  Depression screen [facility] 2/9 10/15/2018 07/13/2018 04/01/2018  Decreased Interest 0 0 0  Down, Depressed,  Hopeless 0 0 0  PHQ - 2 Score 0 0 0  Altered sleeping - - -  Tired, decreased energy - - -  Change in appetite - - -  Feeling bad or failure about yourself  - - -  Trouble concentrating - - -  Moving slowly or fidgety/restless - - -  Suicidal thoughts - - -  PHQ-9 Score - - -  Difficult doing work/chores - - -    Past Medical History:  Diagnosis Date  . Arthropathy, unspecified, site unspecified   . Esophageal reflux   . Osteoporosis, unspecified   . Other abnormal glucose   . Other and unspecified hyperlipidemia   . Symptomatic menopausal or female climacteric states   . Syncope and collapse    Past Surgical History:  Procedure Laterality Date  . ABDOMINAL HYSTERECTOMY    . APPENDECTOMY    . BREAST BIOPSY Left 1970's   neg  . COLONOSCOPY    . FINGER FRACTURE SURGERY     Social History   Socioeconomic History  . Marital status: Widowed    Spouse name: Not on file  . Number of children: Not on file  . Years of education: Not on file  . Highest education level: Not on file  Occupational History  . Not on file  Social Needs  . Financial resource strain: Not on file  . Food insecurity:    Worry: Not on file    Inability: Not on file  . Transportation needs:    Medical: Not on file    Non-medical: Not on file  Tobacco Use  .  Smoking status: Former Smoker    Packs/day: 1.00    Years: 26.00    Pack years: 26.00    Types: Cigarettes    Last attempt to quit: 06/07/1992    Years since quitting: 26.3  . Smokeless tobacco: Never Used  Substance and Sexual Activity  . Alcohol use: Yes    Alcohol/week: 0.0 standard drinks    Comment: occassional  . Drug use: No  . Sexual activity: Not on file  Lifestyle  . Physical activity:    Days per week: Not on file    Minutes per session: Not on file  . Stress: Not on file  Relationships  . Social connections:    Talks on phone: Not on file    Gets together: Not on file    Attends religious service: Not on file     Active member of club or organization: Not on file    Attends meetings of clubs or organizations: Not on file    Relationship status: Not on file  . Intimate partner violence:    Fear of current or ex partner: Not on file    Emotionally abused: Not on file    Physically abused: Not on file    Forced sexual activity: Not on file  Other Topics Concern  . Not on file  Social History Narrative  . Not on file   Family History  Problem Relation Age of Onset  . Diabetes Mother   . Heart disease Mother   . Heart attack Mother   . Ulcers Father   . Gallstones Father   . Heart attack Brother    Current Outpatient Medications on File Prior to Visit  Medication Sig  . acetaminophen (TYLENOL) 500 MG tablet Take 1,000 mg by mouth as needed.  Marland Kitchen albuterol (PROVENTIL HFA;VENTOLIN HFA) 108 (90 Base) MCG/ACT inhaler Inhale 2 puffs into the lungs every 4 (four) hours as needed for wheezing or shortness of breath (cough).  Marland Kitchen amLODipine (NORVASC) 10 MG tablet Take 1 tablet (10 mg total) by mouth daily.  Marland Kitchen aspirin 81 MG tablet Take 81 mg by mouth daily.  . beta carotene w/minerals (OCUVITE) tablet Take 1 tablet by mouth daily.  . Calcium Carb-Cholecalciferol (CALTRATE 600+D) 600-800 MG-UNIT TABS Take 2 tablets by mouth daily.  . fluticasone (FLONASE) 50 MCG/ACT nasal spray Place 2 sprays into both nostrils daily.  . hydrochlorothiazide (HYDRODIURIL) 12.5 MG tablet Take 1 tablet (12.5 mg total) by mouth daily.  Marland Kitchen ketoconazole (NIZORAL) 2 % cream   . Krill Oil 300 MG CAPS Take by mouth daily.  Marland Kitchen lisinopril (PRINIVIL,ZESTRIL) 40 MG tablet Take 1 tablet (40 mg total) by mouth daily.  Marland Kitchen loratadine (CLARITIN) 10 MG tablet Take 10 mg by mouth daily.  . Multiple Vitamin (MULTIVITAMIN) tablet Take 1 tablet by mouth daily.  . naproxen (NAPROSYN) 250 MG tablet   . pantoprazole (PROTONIX) 40 MG tablet Take 1 tablet (40 mg total) by mouth daily before breakfast.  . raloxifene (EVISTA) 60 MG tablet Take 1 tablet  (60 mg total) by mouth daily.  Marland Kitchen scopolamine (TRANSDERM-SCOP) 1 MG/3DAYS Place 1 patch (1.5 mg total) onto the skin every 3 (three) days. For motion sickness. Start 2 hr before onset symptoms, up to 12 hr before   No current facility-administered medications on file prior to visit.     Review of Systems Per HPI unless specifically indicated above     Objective:    BP 134/74 (BP Location: Left Arm, Cuff Size: Normal)  Pulse 72   Temp 97.8 F (36.6 C) (Oral)   Resp 16   Ht 5\' 6"  (1.676 m)   Wt 237 lb (107.5 kg)   BMI 38.25 kg/m   Wt Readings from Last 3 Encounters:  10/15/18 237 lb (107.5 kg)  07/13/18 241 lb (109.3 kg)  06/04/18 241 lb 8 oz (109.5 kg)    Physical Exam Vitals signs and nursing note reviewed.  Constitutional:      General: She is not in acute distress.    Appearance: She is well-developed. She is not diaphoretic.     Comments: Well-appearing, comfortable, cooperative, obese  HENT:     Head: Normocephalic and atraumatic.     Comments: Dry mucus membranes  Frontal / maxillary sinuses non-tender. Nares patent without purulence or edema. Bilateral TMs clear without erythema, effusion or bulging. Oropharynx clear without erythema, exudates, edema or asymmetry. Eyes:     General:        Right eye: No discharge.        Left eye: No discharge.     Conjunctiva/sclera: Conjunctivae normal.     Pupils: Pupils are equal, round, and reactive to light.  Neck:     Musculoskeletal: Normal range of motion and neck supple.     Thyroid: No thyromegaly.  Cardiovascular:     Rate and Rhythm: Normal rate and regular rhythm.     Heart sounds: Normal heart sounds. No murmur.  Pulmonary:     Effort: Pulmonary effort is normal. No respiratory distress.     Breath sounds: Normal breath sounds. No wheezing or rales.  Abdominal:     General: Bowel sounds are normal. There is no distension.     Palpations: Abdomen is soft. There is no mass.     Tenderness: There is no  abdominal tenderness.  Musculoskeletal: Normal range of motion.        General: No tenderness.     Right lower leg: Edema (trace ankle) present.     Left lower leg: Edema (trace ankle) present.     Comments: Upper / Lower Extremities: - Normal muscle tone, strength bilateral upper extremities 5/5, lower extremities 5/5  Lymphadenopathy:     Cervical: No cervical adenopathy.  Skin:    General: Skin is warm and dry.     Findings: No erythema or rash.     Comments: No palpable nodule clavicular region  Neurological:     Mental Status: She is alert and oriented to person, place, and time.     Comments: Distal sensation intact to light touch all extremities  Psychiatric:        Behavior: Behavior normal.     Comments: Well groomed, good eye contact, normal speech and thoughts      Results for orders placed or performed in visit on 10/08/18  T4, free  Result Value Ref Range   Free T4 1.1 0.8 - 1.8 ng/dL  TSH  Result Value Ref Range   TSH 3.99 0.40 - 4.50 mIU/L  Measles/Mumps/Rubella Immunity  Result Value Ref Range   Rubeola IgG 29.90 AU/mL   Mumps IgG <9.00 (L) AU/mL   Rubella 16.40 index  VITAMIN D 25 Hydroxy (Vit-D Deficiency, Fractures)  Result Value Ref Range   Vit D, 25-Hydroxy 26 (L) 30 - 100 ng/mL  Lipid panel  Result Value Ref Range   Cholesterol 255 (H) <200 mg/dL   HDL 49 (L) >50 mg/dL   Triglycerides 238 (H) <150 mg/dL   LDL Cholesterol (Calc) 166 (H)  mg/dL (calc)   Total CHOL/HDL Ratio 5.2 (H) <5.0 (calc)   Non-HDL Cholesterol (Calc) 206 (H) <130 mg/dL (calc)  COMPLETE METABOLIC PANEL WITH GFR  Result Value Ref Range   Glucose, Bld 161 (H) 65 - 99 mg/dL   BUN 13 7 - 25 mg/dL   Creat 0.62 0.60 - 0.93 mg/dL   GFR, Est Non African American 89 > OR = 60 mL/min/1.21m2   GFR, Est African American 103 > OR = 60 mL/min/1.31m2   BUN/Creatinine Ratio NOT APPLICABLE 6 - 22 (calc)   Sodium 141 135 - 146 mmol/L   Potassium 3.7 3.5 - 5.3 mmol/L   Chloride 104 98 - 110  mmol/L   CO2 28 20 - 32 mmol/L   Calcium 9.2 8.6 - 10.4 mg/dL   Total Protein 6.3 6.1 - 8.1 g/dL   Albumin 4.2 3.6 - 5.1 g/dL   Globulin 2.1 1.9 - 3.7 g/dL (calc)   AG Ratio 2.0 1.0 - 2.5 (calc)   Total Bilirubin 0.8 0.2 - 1.2 mg/dL   Alkaline phosphatase (APISO) 76 33 - 130 U/L   AST 36 (H) 10 - 35 U/L   ALT 27 6 - 29 U/L  CBC with Differential/Platelet  Result Value Ref Range   WBC 3.8 3.8 - 10.8 Thousand/uL   RBC 4.41 3.80 - 5.10 Million/uL   Hemoglobin 13.3 11.7 - 15.5 g/dL   HCT 38.7 35.0 - 45.0 %   MCV 87.8 80.0 - 100.0 fL   MCH 30.2 27.0 - 33.0 pg   MCHC 34.4 32.0 - 36.0 g/dL   RDW 13.7 11.0 - 15.0 %   Platelets 171 140 - 400 Thousand/uL   MPV 11.9 7.5 - 12.5 fL   Neutro Abs 2,379 1,500 - 7,800 cells/uL   Lymphs Abs 961 850 - 3,900 cells/uL   Absolute Monocytes 391 200 - 950 cells/uL   Eosinophils Absolute 49 15 - 500 cells/uL   Basophils Absolute 19 0 - 200 cells/uL   Neutrophils Relative % 62.6 %   Total Lymphocyte 25.3 %   Monocytes Relative 10.3 %   Eosinophils Relative 1.3 %   Basophils Relative 0.5 %  Hemoglobin A1c  Result Value Ref Range   Hgb A1c MFr Bld 6.6 (H) <5.7 % of total Hgb   Mean Plasma Glucose 143 (calc)   eAG (mmol/L) 7.9 (calc)      Assessment & Plan:   Problem List Items Addressed This Visit    Essential hypertension    Mildly elevated BP today, repeat improved - overall trend improved  Plan: 1. Continue current Amlodipine 10mg , HCTZ 12.5mg  daily, Continue Lisinopril 40mg  daily 2. Encouraged to continue adjusting diet plan to more regular diet, reviewed low sodium diet, start more regular exercise 3. Monitor BP at home, follow-up sooner if elevated      Hyperlipidemia    Uncontrolled cholesterol off statin, poor lifestyle Last lipid panel 10/2018, LDL >160 History of myalgia intolerance to simvastatin Calculated ASCVD 10 yr risk score elevated  Plan: 1. Advised strongly consider trial of other statin such as lower dose  Rosuvastatin and intermittent dosing - for ASCVD risk however she prefers to try lifestyle intervention first - and re-check lipids 2. Continue ASA 81mg  for primary ASCVD risk reduction 3. Encourage improved lifestyle - low carb/cholesterol, reduce portion size, continue improving regular exercise 4. Follow-up 4 months lipids      Morbid obesity (HCC)    Clinically morbid obesity with BMI >38 with comorbid conditions Hypertension, Hyperlipidemia, Pre-Diabetes,  Osteoporosis  With some recent mild weight loss Improved goals and strategy for wt loss diet primarily, low carb reduce portions, gradually increasing home exercise plan      Pre-diabetes    Recent worsening significant elevated A1c 6.6 with hyperglycemia, no prior >6.5, still considered PreDM at this time. Concern with obesity, HTN, HLD  Plan:  1. Not on any therapy currently - discussed plan to hold now given result and goal to reduce < 6.5 with lifestyle intervention, to more accurately determine if actually diagnosed diabetes or not 2. Encourage improved lifestyle - low carb, low sugar diet, reduce portion size, continue improving regular exercise - try swimming at gym, silver sneakers - or home exercise regimen 3. Follow-up in 4 months - Lab A1c, if >6.5 then new dx DM - future consider therapy even if PreDM      Primary osteoarthritis involving multiple joints    Persistent chronic problem, multiple joint OA/DJD Improving with PT in past Still limited in mobility somewhat Follow-up as planned      Vitamin D deficiency    Improved to 26 On vitamin D 5k still - advised reduce to 2k      Relevant Medications   Cholecalciferol (VITAMIN D3) 50 MCG (2000 UT) capsule    Other Visit Diagnoses    Annual physical exam    -  Primary      Updated Health Maintenance information Reviewed recent lab results with patient Encouraged improvement to lifestyle with diet and exercise - Goal of weight loss   Meds ordered this  encounter  Medications  . Cholecalciferol (VITAMIN D3) 50 MCG (2000 UT) capsule    Sig: Take 1 capsule (2,000 Units total) by mouth daily.    Follow up plan: Return in about 4 months (around 02/13/2019) for PreDM, HLD, HTN.   Future labs ordered for 02/17/19  Nobie Putnam, DO [facility] 10/15/2018, 11:00 AM

## 2018-10-15 NOTE — Assessment & Plan Note (Signed)
Persistent chronic problem, multiple joint OA/DJD Improving with PT in past Still limited in mobility somewhat Follow-up as planned

## 2018-10-15 NOTE — Assessment & Plan Note (Signed)
Mildly elevated BP today, repeat improved - overall trend improved  Plan: 1. Continue current Amlodipine 10mg , HCTZ 12.5mg  daily, Continue Lisinopril 40mg  daily 2. Encouraged to continue adjusting diet plan to more regular diet, reviewed low sodium diet, start more regular exercise 3. Monitor BP at home, follow-up sooner if elevated

## 2018-10-15 NOTE — Assessment & Plan Note (Signed)
Uncontrolled cholesterol off statin, poor lifestyle Last lipid panel 10/2018, LDL >160 History of myalgia intolerance to simvastatin Calculated ASCVD 10 yr risk score elevated  Plan: 1. Advised strongly consider trial of other statin such as lower dose Rosuvastatin and intermittent dosing - for ASCVD risk however she prefers to try lifestyle intervention first - and re-check lipids 2. Continue ASA 81mg  for primary ASCVD risk reduction 3. Encourage improved lifestyle - low carb/cholesterol, reduce portion size, continue improving regular exercise 4. Follow-up 4 months lipids

## 2018-10-15 NOTE — Assessment & Plan Note (Signed)
Improved to 26 On vitamin D 5k still - advised reduce to 2k

## 2018-10-15 NOTE — Assessment & Plan Note (Signed)
Recent worsening significant elevated A1c 6.6 with hyperglycemia, no prior >6.5, still considered PreDM at this time. Concern with obesity, HTN, HLD  Plan:  1. Not on any therapy currently - discussed plan to hold now given result and goal to reduce < 6.5 with lifestyle intervention, to more accurately determine if actually diagnosed diabetes or not 2. Encourage improved lifestyle - low carb, low sugar diet, reduce portion size, continue improving regular exercise - try swimming at gym, silver sneakers - or home exercise regimen 3. Follow-up in 4 months - Lab A1c, if >6.5 then new dx DM - future consider therapy even if PreDM

## 2018-10-15 NOTE — Patient Instructions (Addendum)
Thank you for coming to the office today.  Reminder to call [employer] to find out about sleep study coverage - what to do next  -----------  Try drinking more water! Fill up on a glass of water BEFORE meal.  Eat slower, take a break, see if you are full sooner rather than eating too fast.  Limit soda. Try flavored water or other drinks.  Eat at least 3 meals and 1-2 snacks per day (don't skip breakfast).  Aim for no more than 5 hours between eating. - Tip: If you go >5 hours without eating and become very hungry, your body will supply it's own resources temporarily and you can gain extra weight when you eat.  The 5 Minute Rule of Exercise - Promise yourself to at least do 5 minutes of exercise (make sure you time it), and if at the end of 5 minutes (this is the hardest part of the work-out), if you still feel like you want stop (or not motivated to continue) then allow yourself to stop. Otherwise, more often than not you will feel encouraged that you can continue for a little while longer or even more!  These are general tips for healthy living. Try to start with 1 or 2 habit TODAY and make it a part of your life for several months. You set a goal today to work on:  Once you have 1 or 2 habits down for several months, try to begin working on your next healthy habit. With every single step you take, you will be leading a healthier lifestyle!  Diet Recommendations for Preventing Diabetes   LIMIT Starchy (carb) foods include: Bread, rice, pasta, potatoes, corn, crackers, bagels, muffins, all baked goods.   Protein foods include: Meat, fish, poultry, eggs, dairy foods, and beans such as pinto and kidney beans (beans also provide carbohydrate).   1. Eat at least 3 meals and 1-2 snacks per day. Never go more than 4-5 hours while awake without eating.   2. Limit starchy foods to TWO per meal and ONE per snack. ONE portion of a starchy  food is equal to the following:   - ONE slice of bread (or  its equivalent, such as half of a hamburger bun).   - 1/2 cup of a "scoopable" starchy food such as potatoes or rice.   - 1 OUNCE (28 grams) of starchy snacks (crackers or pretzels, look on label).   - 15 grams of carbohydrate as shown on food label.   3. Both lunch and dinner should include a protein food, a carb food, and vegetables.   - Obtain twice as many veg's as protein or carbohydrate foods for both lunch and dinner.   - Try to keep frozen veg's on hand for a quick vegetable serving.     - Fresh or frozen veg's are best.   4. Breakfast should always include protein.     DUE for FASTING BLOOD WORK (no food or drink after midnight before the lab appointment, only water or coffee without cream/sugar on the morning of)  SCHEDULE "Lab Only" visit in the morning at the clinic for lab draw in 4 MONTHS   - Make sure Lab Only appointment is at about 1 week before your next appointment, so that results will be available  For Lab Results, once available within 2-3 days of blood draw, you can can log in to MyChart online to view your results and a brief explanation. Also, we can discuss results at next follow-up  visit.    Please schedule a Follow-up Appointment to: Return in about 4 months (around 02/13/2019) for PreDM, HLD, HTN.  If you have any other questions or concerns, please feel free to call the office or send a message through [facility]. You may also schedule an earlier appointment if necessary.  Additionally, you may be receiving a survey about your experience at our office within a few days to 1 week by e-mail or mail. We value your feedback.  Nobie Putnam, DO [facility]

## 2018-12-10 ENCOUNTER — Other Ambulatory Visit: Payer: Self-pay | Admitting: Nurse Practitioner

## 2018-12-10 ENCOUNTER — Other Ambulatory Visit: Payer: Self-pay | Admitting: Family Medicine

## 2018-12-10 DIAGNOSIS — I10 Essential (primary) hypertension: Secondary | ICD-10-CM

## 2019-02-17 ENCOUNTER — Other Ambulatory Visit: Payer: Self-pay

## 2019-02-17 ENCOUNTER — Other Ambulatory Visit: Payer: Medicare HMO

## 2019-02-17 DIAGNOSIS — E7849 Other hyperlipidemia: Secondary | ICD-10-CM | POA: Diagnosis not present

## 2019-02-17 DIAGNOSIS — E669 Obesity, unspecified: Secondary | ICD-10-CM | POA: Diagnosis not present

## 2019-02-17 DIAGNOSIS — R7303 Prediabetes: Secondary | ICD-10-CM | POA: Diagnosis not present

## 2019-02-18 LAB — LIPID PANEL
Cholesterol: 245 mg/dL — ABNORMAL HIGH (ref <200)
HDL: 46 mg/dL — ABNORMAL LOW (ref >=50)
LDL Cholesterol (Calc): 154 mg/dL (calc) — ABNORMAL HIGH
Non-HDL Cholesterol (Calc): 199 mg/dL (calc) — ABNORMAL HIGH (ref <130)
Total CHOL/HDL Ratio: 5.3 (calc) — ABNORMAL HIGH (ref <5.0)
Triglycerides: 277 mg/dL — ABNORMAL HIGH (ref <150)

## 2019-02-18 LAB — HEMOGLOBIN A1C
Hgb A1c MFr Bld: 6.6 % of total Hgb — ABNORMAL HIGH (ref <5.7)
Mean Plasma Glucose: 143 (calc)
eAG (mmol/L): 7.9 (calc)

## 2019-02-24 ENCOUNTER — Telehealth: Payer: Self-pay

## 2019-02-24 ENCOUNTER — Ambulatory Visit (INDEPENDENT_AMBULATORY_CARE_PROVIDER_SITE_OTHER): Payer: Medicare HMO | Admitting: Family Medicine

## 2019-02-24 ENCOUNTER — Other Ambulatory Visit: Payer: Self-pay

## 2019-02-24 ENCOUNTER — Encounter: Payer: Self-pay | Admitting: Family Medicine

## 2019-02-24 ENCOUNTER — Other Ambulatory Visit: Payer: Self-pay | Admitting: Family Medicine

## 2019-02-24 DIAGNOSIS — I10 Essential (primary) hypertension: Secondary | ICD-10-CM | POA: Diagnosis not present

## 2019-02-24 DIAGNOSIS — E7849 Other hyperlipidemia: Secondary | ICD-10-CM | POA: Diagnosis not present

## 2019-02-24 DIAGNOSIS — R7303 Prediabetes: Secondary | ICD-10-CM | POA: Diagnosis not present

## 2019-02-24 NOTE — Assessment & Plan Note (Signed)
Clinically morbid obesity with BMI >38 with comorbid conditions Hypertension, Hyperlipidemia, Pre-Diabetes, Osteoporosis  Improved goals and strategy for wt loss diet primarily, low carb reduce portions, gradually increasing home exercise plan

## 2019-02-24 NOTE — Assessment & Plan Note (Signed)
Still reported mild elevated BP lately but seems improving by readings  Plan: 1. Continue current Amlodipine 10mg , HCTZ 12.5mg  daily, Continue Lisinopril 40mg  daily 2. Encouraged to continue adjusting diet plan to more regular diet, reviewed low sodium diet, start more regular exercise 3. Monitor BP at home, follow-up sooner if elevated

## 2019-02-24 NOTE — Progress Notes (Signed)
Virtual Visit via Telephone The purpose of this virtual visit is to provide medical care while limiting exposure to the novel coronavirus (COVID19) for both patient and office staff.  Consent was obtained for phone visit:  Yes.   Answered questions that patient had about telehealth interaction:  Yes.   I discussed the limitations, risks, security and privacy concerns of performing an evaluation and management service by telephone. I also discussed with the patient that there may be a patient responsible charge related to this service. The patient expressed understanding and agreed to proceed.  Patient Location: Home Provider Location: [facility])  ---------------------------------------------------------------------- Chief Complaint  Patient presents with  . Hypertension    S: Reviewed CMA documentation. I have called patient and gathered additional HPI as follows:  Pre-Diabetes / Hyperglycemia / Morbid Obesity BMI >38 - Last visit with me 10/2018, for same problem PreDiabetes, treated with lifestyle measures, see prior notes for background information. - Interval update with repeat lab A1c 6.6 again, attributed to poor lifestyle due to current coronavirus pandemic with limitations on  CBGs: rarely checks CBG Meds: none Currently on ACEi, ASA 81, statin Lifestyle: - Diet: Poor food choices recently, carbs, sodas now and sweets, not as well hydrated - Exercise: Limited due to arthritis - she is more sedentary but plans to restart some walking  HYPERLIPIDEMIA - Reports concernswith cholesterol, prior history elevated. Last lipid panelrepeated 02/2019 abnormalelevated TG, LDL still. - Previously on Simvastatin 20mg  had myalgias and muscle aches in past and with weakness in legs. - Considering Rosuvastatin low dose, as discussed before, she is not ready to try this yet  CHRONIC HTN: Reports checking occasionally, mild elevated at times. Current Meds  -Amlodipine 10mg  daily, Lisinopril 40mg  daily, HCTZ 12.5mg  daily Reportsnormally good compliance.Tolerating well, w/o complaints. Denies CP, dyspnea, HA, edema, dizziness / lightheadedness  Denies any high risk travel to areas of current concern for COVID19. Denies any known or suspected exposure to person with or possibly with COVID19.  Denies any fevers, chills, sweats, body ache, cough, shortness of breath, sinus pain or pressure, headache, abdominal pain, diarrhea  Past Medical History:  Diagnosis Date  . Arthropathy, unspecified, site unspecified   . Esophageal reflux   . Osteoporosis, unspecified   . Other abnormal glucose   . Other and unspecified hyperlipidemia   . Symptomatic menopausal or female climacteric states   . Syncope and collapse    Social History   Tobacco Use  . Smoking status: Former Smoker    Packs/day: 1.00    Years: 26.00    Pack years: 26.00    Types: Cigarettes    Last attempt to quit: 06/07/1992    Years since quitting: 26.7  . Smokeless tobacco: Never Used  Substance Use Topics  . Alcohol use: Yes    Alcohol/week: 0.0 standard drinks    Comment: occassional  . Drug use: No    Current Outpatient Medications:  .  acetaminophen (TYLENOL) 500 MG tablet, Take 1,000 mg by mouth as needed., Disp: , Rfl:  .  albuterol (PROVENTIL HFA;VENTOLIN HFA) 108 (90 Base) MCG/ACT inhaler, Inhale 2 puffs into the lungs every 4 (four) hours as needed for wheezing or shortness of breath (cough)., Disp: 1 Inhaler, Rfl: 0 .  amLODipine (NORVASC) 10 MG tablet, Take 1 tablet (10 mg total) by mouth daily., Disp: 90 tablet, Rfl: 1 .  aspirin 81 MG tablet, Take 81 mg by mouth daily., Disp: , Rfl:  .  beta carotene w/minerals (OCUVITE)  tablet, Take 1 tablet by mouth daily., Disp: , Rfl:  .  Calcium Carb-Cholecalciferol (CALTRATE 600+D) 600-800 MG-UNIT TABS, Take 2 tablets by mouth daily., Disp: , Rfl:  .  Cholecalciferol (VITAMIN D3) 50 MCG (2000 UT) capsule, Take 1  capsule (2,000 Units total) by mouth daily., Disp: , Rfl:  .  fluticasone (FLONASE) 50 MCG/ACT nasal spray, Place 2 sprays into both nostrils daily., Disp: 16 g, Rfl: 5 .  hydrochlorothiazide (HYDRODIURIL) 12.5 MG tablet, Take 1 tablet (12.5 mg total) by mouth daily., Disp: 90 tablet, Rfl: 1 .  ketoconazole (NIZORAL) 2 % cream, , Disp: , Rfl:  .  Krill Oil 300 MG CAPS, Take by mouth daily., Disp: , Rfl:  .  lisinopril (PRINIVIL,ZESTRIL) 40 MG tablet, Take 1 tablet (40 mg total) by mouth daily., Disp: 90 tablet, Rfl: 1 .  loratadine (CLARITIN) 10 MG tablet, Take 10 mg by mouth daily., Disp: , Rfl:  .  Multiple Vitamin (MULTIVITAMIN) tablet, Take 1 tablet by mouth daily., Disp: , Rfl:  .  naproxen (NAPROSYN) 250 MG tablet, , Disp: , Rfl:  .  pantoprazole (PROTONIX) 40 MG tablet, Take 1 tablet (40 mg total) by mouth daily before breakfast., Disp: 90 tablet, Rfl: 3 .  raloxifene (EVISTA) 60 MG tablet, Take 1 tablet (60 mg total) by mouth daily. (Patient not taking: Reported on 02/24/2019), Disp: 90 tablet, Rfl: 3  Depression screen [facility] 2/9 02/24/2019 10/15/2018 07/13/2018  Decreased Interest 0 0 0  Down, Depressed, Hopeless 0 0 0  PHQ - 2 Score 0 0 0  Altered sleeping - - -  Tired, decreased energy - - -  Change in appetite - - -  Feeling bad or failure about yourself  - - -  Trouble concentrating - - -  Moving slowly or fidgety/restless - - -  Suicidal thoughts - - -  PHQ-9 Score - - -  Difficult doing work/chores - - -    No flowsheet data found.  -------------------------------------------------------------------------- O: No physical exam performed due to remote telephone encounter.  Lab results reviewed.  Recent Labs    10/08/18 0821 02/17/19 0835  HGBA1C 6.6* 6.6*     Recent Results (from the past 2160 hour(s))  Lipid panel     Status: Abnormal   Collection Time: 02/17/19  8:35 AM  Result Value Ref Range   Cholesterol 245 (H) <200 mg/dL   HDL 46 (L) > OR = 50 mg/dL    Triglycerides 277 (H) <150 mg/dL    Comment: . If a non-fasting specimen was collected, consider repeat triglyceride testing on a fasting specimen if clinically indicated.  Yates Decamp et al. J. of Clin. Lipidol. 2025;4:270-623. Marland Kitchen    LDL Cholesterol (Calc) 154 (H) mg/dL (calc)    Comment: Reference range: <100 . Desirable range <100 mg/dL for primary prevention;   <70 mg/dL for patients with CHD or diabetic patients  with > or = 2 CHD risk factors. Marland Kitchen LDL-C is now calculated using the Martin-Hopkins  calculation, which is a validated novel method providing  better accuracy than the Friedewald equation in the  estimation of LDL-C.  Cresenciano Genre et al. Annamaria Helling. 7628;315(17): 2061-2068  (http://education.[facility].com/faq/FAQ164)    Total CHOL/HDL Ratio 5.3 (H) <5.0 (calc)   Non-HDL Cholesterol (Calc) 199 (H) <130 mg/dL (calc)    Comment: For patients with diabetes plus 1 major ASCVD risk  factor, treating to a non-HDL-C goal of <100 mg/dL  (LDL-C of <70 mg/dL) is considered a therapeutic  option.   Hemoglobin  A1c     Status: Abnormal   Collection Time: 02/17/19  8:35 AM  Result Value Ref Range   Hgb A1c MFr Bld 6.6 (H) <5.7 % of total Hgb    Comment: For someone without known diabetes, a hemoglobin A1c value of 6.5% or greater indicates that they may have  diabetes and this should be confirmed with a follow-up  test. . For someone with known diabetes, a value <7% indicates  that their diabetes is well controlled and a value  greater than or equal to 7% indicates suboptimal  control. A1c targets should be individualized based on  duration of diabetes, age, comorbid conditions, and  other considerations. . Currently, no consensus exists regarding use of hemoglobin A1c for diagnosis of diabetes for children. .    Mean Plasma Glucose 143 (calc)   eAG (mmol/L) 7.9 (calc)    -------------------------------------------------------------------------- A&P:  Problem List  Items Addressed This Visit    Essential hypertension    Still reported mild elevated BP lately but seems improving by readings  Plan: 1. Continue current Amlodipine 10mg , HCTZ 12.5mg  daily, Continue Lisinopril 40mg  daily 2. Encouraged to continue adjusting diet plan to more regular diet, reviewed low sodium diet, start more regular exercise 3. Monitor BP at home, follow-up sooner if elevated      Hyperlipidemia    Uncontrolled cholesterol off statin, poor lifestyle Last lipid panel 02/2019, LDL >150, somewhat improved History of myalgia intolerance to simvastatin Calculated ASCVD 10 yr risk score elevated  Plan: 1. Again - Advised strongly consider trial of other statin such as lower dose Rosuvastatin and intermittent dosing - for ASCVD risk however she prefers to try lifestyle intervention still - and re-check lipids 2. Continue ASA 81mg  for primary ASCVD risk reduction 3. Encourage improved lifestyle - low carb/cholesterol, reduce portion size, continue improving regular exercise      Morbid obesity (HCC)    Clinically morbid obesity with BMI >38 with comorbid conditions Hypertension, Hyperlipidemia, Pre-Diabetes, Osteoporosis  Improved goals and strategy for wt loss diet primarily, low carb reduce portions, gradually increasing home exercise plan      Pre-diabetes - Primary    Persistent elevated A1c 6.6 with hyperglycemia, last 6.6 as well - attributed to lifestyle, limited by coronavirus pandemic preventing her from improving her lifestyle mostly Defer dx diabetes based on current course Concern with obesity, HTN, HLD  Plan:  1. Not on any therapy currently - discussed plan to hold now given result and goal to reduce < 6.5 with lifestyle intervention, to more accurately determine if actually diagnosed diabetes or not 2. Encourage improved lifestyle - low carb, low sugar diet, reduce portion size, continue improving regular exercise - try swimming at gym, silver sneakers - or  home exercise regimen 3. Follow-up in 3 months - Lab A1c, if >6.5 then new dx DM - future consider therapy even if PreDM         No orders of the defined types were placed in this encounter.   Follow-up: - Return in 3 months for PreDM, HLD, HTN lab results - Future labs ordered for 05/21/19  Patient verbalizes understanding with the above medical recommendations including the limitation of remote medical advice.  Specific follow-up and call-back criteria were given for patient to follow-up or seek medical care more urgently if needed.   - Time spent in direct consultation with patient on phone: 17 minutes   Nobie Putnam, [facility] 02/24/2019, 11:15 AM

## 2019-02-24 NOTE — Patient Instructions (Addendum)
Recent Labs    10/08/18 0821 02/17/19 0835  HGBA1C 6.6* 6.6*   Still elevated A1c sugar.  Still elevated cholesterol.  Try to improve diet and lifestyle as discussed, low carb low sugar and reduce portions. More water for hydration.  Start walking again if possible.  If you can control A1c sugar < 6.5 then it is still consistent with Pre-Diabetes, otherwise we could consider treatment for Type 2 Diabetes, such as Metformin medicine as discussed.  Next time I would also strongly consider low dose Rosuvastatin cholesterol medicine (generic crestor) it is better tolerated than the Simvastatin and could help reduce risk of cardiovascular issues.  Please schedule a Follow-up Appointment to: Return in about 3 months (around 05/27/2019) for PreDM, HLD, HTN lab results.  If you have any other questions or concerns, please feel free to call the office or send a message through [place]. You may also schedule an earlier appointment if necessary.  Additionally, you may be receiving a survey about your experience at our office within a few days to 1 week by e-mail or mail. We value your feedback.  Nobie Putnam, DO [place]

## 2019-02-24 NOTE — Assessment & Plan Note (Addendum)
Persistent elevated A1c 6.6 with hyperglycemia, last 6.6 as well - attributed to lifestyle, limited by coronavirus pandemic preventing her from improving her lifestyle mostly Defer dx diabetes based on current course Concern with obesity, HTN, HLD  Plan:  1. Not on any therapy currently - discussed plan to hold now given result and goal to reduce < 6.5 with lifestyle intervention, to more accurately determine if actually diagnosed diabetes or not 2. Encourage improved lifestyle - low carb, low sugar diet, reduce portion size, continue improving regular exercise - try swimming at gym, silver sneakers - or home exercise regimen 3. Follow-up in 3 months - Lab A1c, if >6.5 then new dx DM - future consider therapy even if PreDM

## 2019-02-24 NOTE — Telephone Encounter (Signed)
Error

## 2019-02-24 NOTE — Assessment & Plan Note (Signed)
Uncontrolled cholesterol off statin, poor lifestyle Last lipid panel 02/2019, LDL >150, somewhat improved History of myalgia intolerance to simvastatin Calculated ASCVD 10 yr risk score elevated  Plan: 1. Again - Advised strongly consider trial of other statin such as lower dose Rosuvastatin and intermittent dosing - for ASCVD risk however she prefers to try lifestyle intervention still - and re-check lipids 2. Continue ASA 81mg  for primary ASCVD risk reduction 3. Encourage improved lifestyle - low carb/cholesterol, reduce portion size, continue improving regular exercise

## 2019-05-21 ENCOUNTER — Other Ambulatory Visit: Payer: Medicare HMO

## 2019-05-21 ENCOUNTER — Other Ambulatory Visit: Payer: Self-pay

## 2019-05-21 DIAGNOSIS — E7849 Other hyperlipidemia: Secondary | ICD-10-CM

## 2019-05-21 DIAGNOSIS — R7303 Prediabetes: Secondary | ICD-10-CM | POA: Diagnosis not present

## 2019-05-22 LAB — HEMOGLOBIN A1C
Hgb A1c MFr Bld: 7 % of total Hgb — ABNORMAL HIGH (ref <5.7)
Mean Plasma Glucose: 154 (calc)
eAG (mmol/L): 8.5 (calc)

## 2019-05-22 LAB — LIPID PANEL
Cholesterol: 233 mg/dL — ABNORMAL HIGH (ref <200)
HDL: 47 mg/dL — ABNORMAL LOW (ref >=50)
LDL Cholesterol (Calc): 148 mg/dL (calc) — ABNORMAL HIGH
Non-HDL Cholesterol (Calc): 186 mg/dL (calc) — ABNORMAL HIGH (ref <130)
Total CHOL/HDL Ratio: 5 (calc) — ABNORMAL HIGH (ref <5.0)
Triglycerides: 249 mg/dL — ABNORMAL HIGH (ref <150)

## 2019-05-24 ENCOUNTER — Encounter: Payer: Self-pay | Admitting: Family Medicine

## 2019-05-25 ENCOUNTER — Other Ambulatory Visit: Payer: Self-pay

## 2019-05-28 ENCOUNTER — Other Ambulatory Visit: Payer: Self-pay

## 2019-05-28 ENCOUNTER — Ambulatory Visit (INDEPENDENT_AMBULATORY_CARE_PROVIDER_SITE_OTHER): Payer: Medicare HMO | Admitting: Family Medicine

## 2019-05-28 ENCOUNTER — Encounter: Payer: Self-pay | Admitting: Family Medicine

## 2019-05-28 ENCOUNTER — Other Ambulatory Visit: Payer: Self-pay | Admitting: Family Medicine

## 2019-05-28 VITALS — BP 176/72 | HR 67 | Temp 98.1°F | Resp 16 | Ht 66.0 in | Wt 236.0 lb

## 2019-05-28 DIAGNOSIS — E785 Hyperlipidemia, unspecified: Secondary | ICD-10-CM

## 2019-05-28 DIAGNOSIS — I10 Essential (primary) hypertension: Secondary | ICD-10-CM | POA: Diagnosis not present

## 2019-05-28 DIAGNOSIS — E1169 Type 2 diabetes mellitus with other specified complication: Secondary | ICD-10-CM

## 2019-05-28 DIAGNOSIS — K219 Gastro-esophageal reflux disease without esophagitis: Secondary | ICD-10-CM

## 2019-05-28 MED ORDER — ROSUVASTATIN CALCIUM 5 MG PO TABS: 5.0000 mg | ORAL_TABLET | ORAL | 1 refills | Status: DC

## 2019-05-28 MED ORDER — HYDROCHLOROTHIAZIDE 12.5 MG PO TABS: 12.5000 mg | ORAL_TABLET | Freq: Every day | ORAL | 3 refills | Status: DC

## 2019-05-28 MED ORDER — AMLODIPINE BESYLATE 10 MG PO TABS: 10.0000 mg | ORAL_TABLET | Freq: Every day | ORAL | 3 refills | Status: DC

## 2019-05-28 MED ORDER — METFORMIN HCL 500 MG PO TABS: 500.0000 mg | ORAL_TABLET | Freq: Two times a day (BID) | ORAL | 1 refills | Status: DC

## 2019-05-28 MED ORDER — LISINOPRIL 40 MG PO TABS: 40.0000 mg | ORAL_TABLET | Freq: Every day | ORAL | 3 refills | Status: DC

## 2019-05-28 NOTE — Patient Instructions (Addendum)
Thank you for coming to the office today.  If ready - start Metformin 500mg  once daily with supper then eventually if tolerated can add 2nd dose to AM with breakfast.   Future can check w/ insurance on glucometer or [employer] Relion meter.  You are at increased risk of future Cardiovascular complications such as Heart Attack or Stroke from an artery blockage due to abnormal cholesterol and/or risk factors. - As discussed, Statin Cholesterol pills both can both LOWER cholesterol and REDUCE this future risk of heart attack and stroke - Start Rosuvastatin (generic Crestor) 5mg  pill once at bedtime WEEKLY  If you develop mild aches or pains in muscle or joint that does NOT improve or go away after first 3-4 weeks then this may require [place] to adjust the dose. First I would recommend STOPPING the medication for a few weeks until your ache and pain symptoms completely RESOLVE. Then you can restart at a LOWER DOSE either HALF a pill at bedtime every night or LESS OFTEN such as one pill a week only and then gradually increase to every other day or max dose of 3 times a week  Lastly, sometimes we need to try other versions of this medicine to find one that works for you and does not cause side effects.   ------------------------------------------------------  Please check back in with Dr Wyatt Portela at [place] provider would like to you have your annual eye exam. Please contact your current eye doctor or here are some good options for you to contact.   [facility]   Address: [address] [place], [place] [address] Phone: [phone]  Website: [email or web address] [address] [place], [place] [address] Phone: [phone] [email or web address]  [facility]  Address: [place], [place], [place] [address] Phone: [phone]   [facility] [address] [place], [place] [address] Phone: [phone]  [facility] Address: [place], [place], [place] [address]  Phone: [phone]  ------------------------------------------------  Recommend to start taking Tylenol Extra Strength 500mg  tabs - take 1 to 2 tabs per dose (max 1000mg ) every 6-8 hours for pain (take regularly, don't skip a dose for next 7 days), max 24 hour daily dose is 6 tablets or 3000mg . In the future you can repeat the same everyday Tylenol course for 1-2 weeks at a time.    Please schedule a Follow-up Appointment to: Return in about 3 months (around 08/28/2019) for DM A1c.  If you have any other questions or concerns, please feel free to call the office or send a message through [place]. You may also schedule an earlier appointment if necessary.  Additionally, you may be receiving a survey about your experience at our office within a few days to 1 week by e-mail or mail. We value your feedback.  Nobie Putnam, DO [place]

## 2019-05-28 NOTE — Assessment & Plan Note (Signed)
Clinically morbid obesity with BMI >38 with comorbid conditions Hypertension, Hyperlipidemia, Pre-Diabetes, Osteoporosis  Goals and strategy for wt loss diet primarily, low carb reduce portions, gradually increasing home exercise plan

## 2019-05-28 NOTE — Assessment & Plan Note (Addendum)
New dx Type 2 Diabetes with A1c up to 7.0, previously 6.6 x 2 readings Poor lifestyle limited by COVID19 restrictions now Concern with obesity, HTN, HLD  Plan:  1. START Metformin IR 500mg  BID - dosing at daily wc first to adjust  2. Encourage improved lifestyle - low carb, low sugar diet, reduce portion size, continue improving regular exercise - home exercise regimen - request to get children to help with getting fresh groceries to limit take out - Discussed GLP1 in future, she is hesitant about injection but may consider this 3. DM Foot exam today, recommend DM eye exam  Follow-up in 3 months - DM A1c  Referral to CCM Nurse CM for further DM education on new diagnosis and may benefit from resources to help with meals if possible

## 2019-05-28 NOTE — Progress Notes (Signed)
Subjective:    Patient ID: Yvette [name], female    DOB: [birth year], 73 y.o.   MRN: [id number]  Yvette [name] is a 73 y.o. female presenting on 05/28/2019 for Hyperlipidemia   HPI   Pre-Diabetes/ Hyperglycemia/ Morbid Obesity BMI >38 Recent lab review, A1c up to 7.0, previously 6.6 x 2 readings since 10/2018 Update now still attributed to poor lifestyle due to current coronavirus pandemic with limitations on diet / exercise CBGs: rarely checks CBG Meds: none - never on before. Currently on ACEi, ASA 81, statin Lifestyle: - Weight stable to down 1 lb in 7 months - Diet: Poor food choices still mostly take out and limited home cooking - Exercise: Limited due to arthritisand also more sedentary limited ability to go exercise now with COVID19 pandemic - She has children helping pick up some groceries and some food but this is limited as well Denies hypoglycemia, polyuria, visual changes, numbness or tingling.  HYPERLIPIDEMIA - Reports concernswith cholesterol, prior history elevated. Last lipid panelrepeated 05/2019 still abnormalelevated TG, LDL with mild improvement - Off statin currently. Previously myalgias on Simvastatin, considered Rosuvastatin has not tried it before  CHRONIC HTN: Out of meds 4-5 days, elevated BP Current Meds -Amlodipine10mg  daily, Lisinopril 40mg  daily, HCTZ12.5mg  daily Reportsnormally good compliance.Tolerating well, w/o complaints. Denies CP, dyspnea, HA, edema, dizziness / lightheadedness   Depression screen [facility] 2/9 05/28/2019 02/24/2019 10/15/2018  Decreased Interest 0 0 0  Down, Depressed, Hopeless 0 0 0  PHQ - 2 Score 0 0 0  Altered sleeping - - -  Tired, decreased energy - - -  Change in appetite - - -  Feeling bad or failure about yourself  - - -  Trouble concentrating - - -  Moving slowly or fidgety/restless - - -  Suicidal thoughts - - -  PHQ-9 Score - - -  Difficult doing work/chores - - -    Social History   Tobacco Use   . Smoking status: Former Smoker    Packs/day: 1.00    Years: 26.00    Pack years: 26.00    Types: Cigarettes    Quit date: 06/07/1992    Years since quitting: 26.9  . Smokeless tobacco: Never Used  Substance Use Topics  . Alcohol use: Yes    Alcohol/week: 0.0 standard drinks    Comment: occassional  . Drug use: No    Review of Systems Per HPI unless specifically indicated above     Objective:    BP (!) 176/72   Pulse 67   Temp 98.1 F (36.7 C) (Oral)   Resp 16   Ht 5\' 6"  (1.676 m)   Wt 236 lb (107 kg)   BMI 38.09 kg/m   Wt Readings from Last 3 Encounters:  05/28/19 236 lb (107 kg)  10/15/18 237 lb (107.5 kg)  07/13/18 241 lb (109.3 kg)    Physical Exam Vitals signs and nursing note reviewed.  Constitutional:      General: She is not in acute distress.    Appearance: She is well-developed. She is not diaphoretic.     Comments: Well-appearing, comfortable, cooperative, obese  HENT:     Head: Normocephalic and atraumatic.  Eyes:     General:        Right eye: No discharge.        Left eye: No discharge.     Conjunctiva/sclera: Conjunctivae normal.  Neck:     Musculoskeletal: Normal range of motion and neck supple.  Thyroid: No thyromegaly.  Cardiovascular:     Rate and Rhythm: Normal rate and regular rhythm.     Heart sounds: Normal heart sounds. No murmur.  Pulmonary:     Effort: Pulmonary effort is normal. No respiratory distress.     Breath sounds: Normal breath sounds. No wheezing or rales.  Musculoskeletal: Normal range of motion.     Right lower leg: No edema.     Left lower leg: No edema.  Lymphadenopathy:     Cervical: No cervical adenopathy.  Skin:    General: Skin is warm and dry.     Findings: No erythema or rash.  Neurological:     Mental Status: She is alert and oriented to person, place, and time.  Psychiatric:        Behavior: Behavior normal.     Comments: Well groomed, good eye contact, normal speech and thoughts      Diabetic  Foot Exam - Simple   Simple Foot Form Diabetic Foot exam was performed with the following findings: Yes 05/28/2019 10:45 AM  Visual Inspection See comments: Yes Sensation Testing Intact to touch and monofilament testing bilaterally: Yes Pulse Check Posterior Tibialis and Dorsalis pulse intact bilaterally: Yes Comments Bilateral feet with some callus formation forefeet, great toe, and heels, also L foot has large bunion, uncomplicated. No ulceration.    Recent Labs    10/08/18 0821 02/17/19 0835 05/21/19 0804  HGBA1C 6.6* 6.6* 7.0*     Results for orders placed or performed in visit on 05/21/19  Lipid panel  Result Value Ref Range   Cholesterol 233 (H) <200 mg/dL   HDL 47 (L) > OR = 50 mg/dL   Triglycerides 249 (H) <150 mg/dL   LDL Cholesterol (Calc) 148 (H) mg/dL (calc)   Total CHOL/HDL Ratio 5.0 (H) <5.0 (calc)   Non-HDL Cholesterol (Calc) 186 (H) <130 mg/dL (calc)  Hemoglobin A1c  Result Value Ref Range   Hgb A1c MFr Bld 7.0 (H) <5.7 % of total Hgb   Mean Plasma Glucose 154 (calc)   eAG (mmol/L) 8.5 (calc)      Assessment & Plan:   Problem List Items Addressed This Visit    Essential hypertension    Elevated BP off meds, 4-5 days, needs refills  Plan: 1. Continue current Amlodipine 10mg , HCTZ 12.5mg  daily, Continue Lisinopril 40mg  daily - REFILL meds 2. Encouraged to continue adjusting diet plan to more regular diet, reviewed low sodium diet, start more regular exercise 3. Monitor BP at home, follow-up sooner if elevated      Relevant Medications   rosuvastatin (CRESTOR) 5 MG tablet   hydrochlorothiazide (HYDRODIURIL) 12.5 MG tablet   amLODipine (NORVASC) 10 MG tablet   lisinopril (ZESTRIL) 40 MG tablet   Other Relevant Orders   Ambulatory referral to Chronic Care Management Services   Hyperlipidemia associated with type 2 diabetes mellitus ([place])    Uncontrolled cholesterol off statin, poor lifestyle Last lipid panel 05/2019 still elevated LDL History of  myalgia intolerance to simvastatin Calculated ASCVD 10 yr risk score elevated  Plan: 1. START INTERMITTENT STATIN - Rosuvastatin 5mg  weekly low dose, #13 pills with refill for 90 day supply, may adjust in future, she agrees to try statin again 2. Continue ASA 81mg  for primary ASCVD risk reduction 3. Encourage improved lifestyle - low carb/cholesterol, reduce portion size, continue improving regular exercise      Relevant Medications   metFORMIN (GLUCOPHAGE) 500 MG tablet   rosuvastatin (CRESTOR) 5 MG tablet   lisinopril (ZESTRIL)  40 MG tablet   Other Relevant Orders   Ambulatory referral to Chronic Care Management Services   Morbid obesity ([place])    Clinically morbid obesity with BMI >38 with comorbid conditions Hypertension, Hyperlipidemia, Pre-Diabetes, Osteoporosis  Goals and strategy for wt loss diet primarily, low carb reduce portions, gradually increasing home exercise plan      Relevant Medications   metFORMIN (GLUCOPHAGE) 500 MG tablet   Type 2 diabetes mellitus with other specified complication ([place]) - Primary    New dx Type 2 Diabetes with A1c up to 7.0, previously 6.6 x 2 readings Poor lifestyle limited by COVID19 restrictions now Concern with obesity, HTN, HLD  Plan:  1. START Metformin IR 500mg  BID - dosing at daily wc first to adjust  2. Encourage improved lifestyle - low carb, low sugar diet, reduce portion size, continue improving regular exercise - home exercise regimen - request to get children to help with getting fresh groceries to limit take out - Discussed GLP1 in future, she is hesitant about injection but may consider this 3. DM Foot exam today, recommend DM eye exam  Follow-up in 3 months - DM A1c  Referral to CCM Nurse CM for further DM education on new diagnosis and may benefit from resources to help with meals if possible      Relevant Medications   metFORMIN (GLUCOPHAGE) 500 MG tablet   rosuvastatin (CRESTOR) 5 MG tablet   lisinopril (ZESTRIL) 40  MG tablet   Other Relevant Orders   Ambulatory referral to Chronic Care Management Services      Meds ordered this encounter  Medications  . metFORMIN (GLUCOPHAGE) 500 MG tablet    Sig: Take 1 tablet (500 mg total) by mouth 2 (two) times daily with a meal.    Dispense:  180 tablet    Refill:  1  . rosuvastatin (CRESTOR) 5 MG tablet    Sig: Take 1 tablet (5 mg total) by mouth once a week.    Dispense:  13 tablet    Refill:  1  . hydrochlorothiazide (HYDRODIURIL) 12.5 MG tablet    Sig: Take 1 tablet (12.5 mg total) by mouth daily.    Dispense:  90 tablet    Refill:  3  . amLODipine (NORVASC) 10 MG tablet    Sig: Take 1 tablet (10 mg total) by mouth daily.    Dispense:  90 tablet    Refill:  3  . lisinopril (ZESTRIL) 40 MG tablet    Sig: Take 1 tablet (40 mg total) by mouth daily.    Dispense:  90 tablet    Refill:  3     Follow up plan: Return in about 3 months (around 08/28/2019) for DM A1c.   Nobie Putnam, DO [facility] 05/28/2019, 10:28 AM

## 2019-05-28 NOTE — Assessment & Plan Note (Signed)
Uncontrolled cholesterol off statin, poor lifestyle Last lipid panel 05/2019 still elevated LDL History of myalgia intolerance to simvastatin Calculated ASCVD 10 yr risk score elevated  Plan: 1. START INTERMITTENT STATIN - Rosuvastatin 5mg  weekly low dose, #13 pills with refill for 90 day supply, may adjust in future, she agrees to try statin again 2. Continue ASA 81mg  for primary ASCVD risk reduction 3. Encourage improved lifestyle - low carb/cholesterol, reduce portion size, continue improving regular exercise

## 2019-05-28 NOTE — Assessment & Plan Note (Signed)
Elevated BP off meds, 4-5 days, needs refills  Plan: 1. Continue current Amlodipine 10mg , HCTZ 12.5mg  daily, Continue Lisinopril 40mg  daily - REFILL meds 2. Encouraged to continue adjusting diet plan to more regular diet, reviewed low sodium diet, start more regular exercise 3. Monitor BP at home, follow-up sooner if elevated

## 2019-05-31 ENCOUNTER — Telehealth: Payer: Self-pay | Admitting: Family Medicine

## 2019-05-31 NOTE — Chronic Care Management (AMB) (Signed)
Chronic Care Management   Note  05/31/2019 Name: Yvette [name] MRN: [id number] DOB: 20-Feb-1946  Yvette [name] is a 73 y.o. year old female who is a primary care patient of Olin Hauser, DO. I reached out to Elie Confer by phone today in response to a referral sent by [name] health plan.    [name] was given information about Chronic Care Management services today including:  1. CCM service includes personalized support from designated clinical staff supervised by her physician, including individualized plan of care and coordination with other care providers 2. 24/7 contact phone numbers for assistance for urgent and routine care needs. 3. Service will only be billed when office clinical staff spend 20 minutes or more in a month to coordinate care. 4. Only one practitioner may furnish and bill the service in a calendar month. 5. The patient may stop CCM services at any time (effective at the end of the month) by phone call to the office staff. 6. The patient will be responsible for cost sharing (co-pay) of up to 20% of the service fee (after annual deductible is met).  Patient agreed to services and verbal consent obtained.   Follow up plan: Telephone appointment with CCM team member scheduled for: 06/08/2019  [place]  ??[email or web address]   ??[phone]

## 2019-06-03 ENCOUNTER — Telehealth: Payer: Medicare HMO

## 2019-06-08 ENCOUNTER — Telehealth: Payer: Medicare HMO

## 2019-06-08 ENCOUNTER — Ambulatory Visit: Payer: Self-pay | Admitting: Pharmacist

## 2019-06-08 NOTE — Chronic Care Management (AMB) (Signed)
  Chronic Care Management   Follow Up Note   06/08/2019 Name: MAVIS KARNITZ MRN: [id number] DOB: 1946/05/24  Referred by: Olin Hauser, DO Reason for referral : Chronic Care Management (Initial Patient Outreach Call)   PACIE SCHLACK is a 73 y.o. year old female who is a primary care patient of Olin Hauser, DO. The CCM team was consulted for assistance with chronic disease management and care coordination needs by patient's health plan.    I reached out to patient by phone today as scheduled by Care Guide. Was unable to reach patient via telephone today and have left HIPAA compliant voicemail asking patient to return my call.   Plan  The care management team will reach out to the patient again over the next 14 days.   Harlow Asa, PharmD, [place] [employer] [phone]

## 2019-06-09 ENCOUNTER — Ambulatory Visit (INDEPENDENT_AMBULATORY_CARE_PROVIDER_SITE_OTHER): Payer: Medicare HMO | Admitting: Pharmacist

## 2019-06-09 DIAGNOSIS — I10 Essential (primary) hypertension: Secondary | ICD-10-CM

## 2019-06-09 DIAGNOSIS — E1169 Type 2 diabetes mellitus with other specified complication: Secondary | ICD-10-CM

## 2019-06-09 DIAGNOSIS — E785 Hyperlipidemia, unspecified: Secondary | ICD-10-CM

## 2019-06-09 NOTE — Chronic Care Management (AMB) (Addendum)
Chronic Care Management   Note  06/09/2019 Name: Yvette [name] MRN: [id number] DOB: 05/01/1946   Subjective:   Yvette [name] is a 73 y.o. year old female who is a primary care patient of Olin Hauser, DO. The CCM team was consulted for assistance with chronic disease management and care coordination needs by patient's health plan. Yvette [name] has a past medical history including but not limited to type 2 diabetes, hypertension, seasonal allergies, GERD, hyperlipidemia, osteoporosis and Vitamin D deficiency.   I reached out to patient by phone today as scheduled by Care Guide.   Review of patient status, including review of consultants reports, laboratory and other test data, was performed as part of comprehensive evaluation and provision of chronic care management services.   SDOH (Social Determinants of Health) screening performed today: Physical Activity. See Care Plan for related entries.   Objective:  Lab Results  Component Value Date   CREATININE 0.62 10/08/2018   CREATININE 0.69 10/13/2017   CREATININE 0.66 10/16/2016    Lab Results  Component Value Date   HGBA1C 7.0 (H) 05/21/2019       Component Value Date/Time   CHOL 233 (H) 05/21/2019 0804   TRIG 249 (H) 05/21/2019 0804   HDL 47 (L) 05/21/2019 0804   CHOLHDL 5.0 (H) 05/21/2019 0804   VLDL 59 (H) 10/16/2016 0001   LDLCALC 148 (H) 05/21/2019 0804    ASCVD: The 10-year ASCVD risk score Yvette [name]., et al., 2013) is: 58.6%   Values used to calculate the score:     Age: 60 years     Sex: Female     Is Non-Hispanic African American: No     Diabetic: Yes     Tobacco smoker: No     Systolic Blood Pressure: 0000000 mmHg     Is BP treated: Yes     HDL Cholesterol: 47 mg/dL     Total Cholesterol: 233 mg/dL    BP Readings from Last 3 Encounters:  05/28/19 (!) 176/72  10/15/18 134/74  07/13/18 137/70    Allergies  Allergen Reactions  . Simvastatin Other (See Comments)    Myalgia, muscle aches  lower extremity  . Erythromycin Nausea And Vomiting    Medications Reviewed Today    Reviewed by Vella Raring, [facility] (Pharmacist) on 06/09/19 at 1458  Med List Status: <None>  Medication Order Taking? Sig Documenting Provider Last Dose Status Informant  acetaminophen (TYLENOL) 500 MG tablet [id number] Yes Take 500 mg by mouth 2 (two) times daily as needed.  [provider] Taking Active   albuterol (PROVENTIL HFA;VENTOLIN HFA) 108 (90 Base) MCG/ACT inhaler [id number] No Inhale 2 puffs into the lungs every 4 (four) hours as needed for wheezing or shortness of breath (cough).  Patient not taking: Reported on 06/09/2019   Olin Hauser, DO Not Taking Active   amLODipine (NORVASC) 10 MG tablet [id number] Yes Take 1 tablet (10 mg total) by mouth daily. Olin Hauser, DO Taking Active   aspirin 81 MG tablet [id number] Yes Take 81 mg by mouth daily. [provider] Taking Active   beta carotene w/minerals (OCUVITE) tablet [id number] Yes Take 1 tablet by mouth daily. [provider] Taking Active   Biotin 5000 MCG TABS [id number] Yes Take 1 tablet by mouth daily. [provider] Taking Active   Calcium Carb-Cholecalciferol (CALTRATE 600+D) 600-800 MG-UNIT TABS [id number] No Take 2 tablets by mouth daily. [provider] Not Taking Active   Cholecalciferol (VITAMIN D3) 50  MCG (2000 UT) capsule [id number] Yes Take 1 capsule (2,000 Units total) by mouth daily. Olin Hauser, DO Taking Active   Co-Enzyme Q-10 100 MG CAPS [id number] Yes Take 1 capsule by mouth daily. [provider] Taking Active   Cyanocobalamin ([employer] VITAMIN B-12) 5000 MCG SUBL [id number] Yes Place 1 tablet under the tongue daily. [provider] Taking Active   fluticasone (FLONASE) 50 MCG/ACT nasal spray [id number] Yes Place 2 sprays into both nostrils daily. Olin Hauser, DO Taking Active   hydrochlorothiazide (HYDRODIURIL) 12.5 MG tablet  [id number] Yes Take 1 tablet (12.5 mg total) by mouth daily. Olin Hauser, DO Taking Active   ketoconazole (NIZORAL) 2 % cream [id number] No  [provider] Not Taking Active   Krill Oil [place] [id number] Yes Take by mouth daily. [provider] Taking Active   lisinopril (ZESTRIL) 40 MG tablet [id number] Yes Take 1 tablet (40 mg total) by mouth daily. Olin Hauser, DO Taking Active   loratadine (CLARITIN) 10 MG tablet [id number] Yes Take 10 mg by mouth daily. [provider] Taking Active   Mag Aspart-Potassium Aspart (POTASSIUM & MAGNESIUM ASPARTAT PO) [id number] Yes Take 1 tablet by mouth daily. [provider] Taking Active   metFORMIN (GLUCOPHAGE) 500 MG tablet [id number] Yes Take 1 tablet (500 mg total) by mouth 2 (two) times daily with a meal. Parks Ranger, Devonne Doughty, DO Taking Active   Multiple Vitamin (MULTIVITAMIN) tablet [id number] Yes Take 1 tablet by mouth daily. [provider] Taking Active         Discontinued 06/09/19 1458 (Discontinued by provider)   pantoprazole (PROTONIX) 40 MG tablet [id number] Yes Take 1 tablet (40 mg total) by mouth daily before breakfast. Olin Hauser, DO Taking Active   raloxifene (EVISTA) 60 MG tablet [id number] Yes Take 1 tablet (60 mg total) by mouth daily. Olin Hauser, DO Taking Active   rosuvastatin (CRESTOR) 5 MG tablet [id number] Yes Take 1 tablet (5 mg total) by mouth once a week. Olin Hauser, DO Taking Active            Med Note [facility], [place] Casalino A   Wed Jun 09, 2019  2:33 PM) Takes on Sunday evening           Assessment:   Goals Addressed            This Visit's Progress   . PharmD - Medication Management       Current Barriers:  Marland Kitchen Knowledge Deficits related to basic Diabetes pathophysiology and self-care/management  Pharmacist Clinical Goal(s):  Marland Kitchen Over the next 30 days, patient will work with CM Pharmacist and PCP to address needs  related to diabetes management  Interventions: . Comprehensive medication review performed; medication list updated in electronic medical record o Patient reports taking metformin 500 mg once daily with breakfast and planning to increase to 500 mg BID after 2 weeks as directed by PCP - Reports some nausea with staring the medication. Encourage patient to maintain adherence to aid with tolerance o Reports started taking a Vitamin B12- 5,000 mcg daily supplement as a friend suggested to take this with metformin - Counsel patient about risk of Vitamin B12 deficiency in patients with long-term use of metformin. Share that American Diabetes Association recommends periodic monitoring of Vitamin B12 levels in patients on metformin chronically - Ms. Kennemore states that she will hold off on taking this supplement for now. o Reports not taking calcium supplement, may have had side effects  -  Per chart patient has history of osteoporosis - Encourage patient to try calcium citrate form instead, particularly as patient is on PPI (affect on absorption) o Caution patient about limited data with supplements, as unlike prescription medications, these do not undergo FDA approval and there may be insufficient data to confirm safety, efficacy or potential for interactions. Encourage when taking supplements, to select USP verified products, when available. Marland Kitchen Counsel patient regarding importance of blood sugar control o Counsel on importance of dietary choices in blood sugar control o Encourage patient to find safe ways to exercise during COVID-19 pandemic. Reports currently swimming and doing yardwork at daughter's house a couple of days/week . Inquire about patient's blood pressure control o Reports taking amlodipine, hydrochlorotiazide and lisinopril as directed o Reports checking BP occasionally at home; has an upper arm monitor - Reports readings typically: systolic 0000000 0000000 - Encourage patient to  keep a log when she checks and bring to PCP appointments . Will ask PCP to consider checking patient's Vitamin B12 level periodically (~every 1-2 years). . Discussed plans with patient for ongoing care management follow up and provided patient with direct contact information for care management team  Patient Self Care Activities:  . Self administers medications as prescribed o Reports using weekly pillbox as adherence tool . Attends all scheduled provider appointments . Calls pharmacy for medication refills . Calls provider office for new concerns or questions  Initial goal documentation         Plan:  The care management team will reach out to the patient again over the next 30 days.   Harlow Asa, PharmD, [place] [employer] [phone]

## 2019-06-09 NOTE — Patient Instructions (Signed)
Thank you allowing the Chronic Care Management Team to be a part of your care! It was a pleasure speaking with you today!     CCM (Chronic Care Management) Team    Janci Minor RN, BSN Nurse Care Coordinator  [phone]   Harlow Asa PharmD  Clinical Pharmacist  [phone]   [name] LCSW Clinical Social Worker [phone]  Visit Information  Goals Addressed            This Visit's Progress   . PharmD - Medication Management       Current Barriers:  Marland Kitchen Knowledge Deficits related to basic Diabetes pathophysiology and self-care/management  Pharmacist Clinical Goal(s):  Marland Kitchen Over the next 30 days, patient will work with CM Pharmacist and PCP to address needs related to diabetes management  Interventions: . Comprehensive medication review performed; medication list updated in electronic medical record o Patient reports taking metformin 500 mg once daily with breakfast and planning to increase to 500 mg twice daily after 2 weeks as directed by PCP - Reports some nausea with staring the medication. Encourage patient to maintain adherence to aid with tolerance o Reports started taking a Vitamin B12- 5,000 mcg daily supplement as a friend suggested to take this with metformin - Counsel patient about risk of Vitamin B12 deficiency in patients with long-term use of metformin. Share that American Diabetes Association recommends periodic monitoring of Vitamin B12 levels in patients on metformin chronically - Ms. Lackland states that she will hold off on taking this supplement for now. o Reports not taking calcium supplement, may have had side effects  - Per chart patient has history of osteoporosis - Encourage patient to try calcium citrate form instead, particularly as patient is on PPI (affect on absorption) . Counsel patient regarding importance of blood sugar control o Counsel on importance of dietary choices in blood sugar control o Encourage patient to find safe ways  to exercise during COVID-19 pandemic. Reports currently swimming and doing yardwork at daughter's house a couple of days/week . Inquire about patient's blood pressure control o Reports taking amlodipine, hydrochlorotiazide and lisinopril as directed o Reports checking BP occasionally at home; has an upper arm monitor - Reports readings typically: systolic 0000000 0000000 - Encourage patient to keep a log when she checks and bring to PCP appointments . Will ask PCP to consider checking patient's Vitamin B12 level periodically (~every 1-2 years).  Patient Self Care Activities:  . Self administers medications as prescribed o Reports using weekly pillbox as adherence tool . Attends all scheduled provider appointments . Calls pharmacy for medication refills . Calls provider office for new concerns or questions  Initial goal documentation        The patient verbalized understanding of instructions provided today and declined a print copy of patient instruction materials.   The care management team will reach out to the patient again over the next 30 days.   Harlow Asa, PharmD, [place] [employer] [phone]

## 2019-06-10 ENCOUNTER — Ambulatory Visit: Payer: Self-pay | Admitting: *Deleted

## 2019-06-10 ENCOUNTER — Telehealth: Payer: Self-pay

## 2019-06-10 DIAGNOSIS — E1169 Type 2 diabetes mellitus with other specified complication: Secondary | ICD-10-CM

## 2019-06-10 NOTE — Chronic Care Management (AMB) (Signed)
  Chronic Care Management   Outreach Note  06/10/2019 Name: Yvette [name] MRN: [id number] DOB: 08-18-1946  Referred by: Olin Hauser, DO Reason for referral : Chronic Care Management (Unsuccessful outreach )   An unsuccessful telephone outreach was attempted today. The patient was referred to the case management team by for assistance with chronic care management and care coordination.   Follow Up Plan: A HIPPA compliant phone message was left for the patient providing contact information and requesting a return call.  The care management team will reach out to the patient again over the next 30 days.    Merlene Morse Tee Richeson RN, BSN Nurse Case Pharmacist, [facility]  [phone]) [employer]

## 2019-06-17 ENCOUNTER — Telehealth: Payer: Self-pay

## 2019-06-24 ENCOUNTER — Telehealth: Payer: Self-pay

## 2019-07-01 ENCOUNTER — Telehealth: Payer: Self-pay

## 2019-07-05 ENCOUNTER — Ambulatory Visit: Payer: Medicare HMO | Admitting: Pharmacist

## 2019-07-05 DIAGNOSIS — I10 Essential (primary) hypertension: Secondary | ICD-10-CM

## 2019-07-05 DIAGNOSIS — E1169 Type 2 diabetes mellitus with other specified complication: Secondary | ICD-10-CM

## 2019-07-05 NOTE — Chronic Care Management (AMB) (Signed)
Chronic Care Management   Follow Up Note   07/05/2019 Name: ARICKA CAUGHELL MRN: [id number] DOB: [birth year]  Referred by: Olin Hauser, DO Reason for referral : Chronic Care Management (Patient Phone Call)   Yvette [name] is a 73 y.o. year old female who is a primary care patient of Olin Hauser, DO. The CCM team was consulted for assistance with chronic disease management and care coordination needs.  Ms. Mcinerny has a past medical history including but not limited to type 2 diabetes, hypertension, seasonal allergies, GERD, hyperlipidemia, osteoporosis and Vitamin D deficiency.  I reached out to Elie Confer by phone today.   Review of patient status, including review of consultants reports, relevant laboratory and other test results, and collaboration with appropriate care team members and the patient's provider was performed as part of comprehensive patient evaluation and provision of chronic care management services.    Outpatient Encounter Medications as of 07/05/2019  Medication Sig Note  . amLODipine (NORVASC) 10 MG tablet Take 1 tablet (10 mg total) by mouth daily.   . Calcium-Phosphorus-Vitamin D (CITRACAL +D3 PO) Take 1 Dose by mouth 2 (two) times daily.   . hydrochlorothiazide (HYDRODIURIL) 12.5 MG tablet Take 1 tablet (12.5 mg total) by mouth daily.   Marland Kitchen lisinopril (ZESTRIL) 40 MG tablet Take 1 tablet (40 mg total) by mouth daily.   . metFORMIN (GLUCOPHAGE) 500 MG tablet Take 1 tablet (500 mg total) by mouth 2 (two) times daily with a meal.   . acetaminophen (TYLENOL) 500 MG tablet Take 500 mg by mouth 2 (two) times daily as needed.    Marland Kitchen albuterol (PROVENTIL HFA;VENTOLIN HFA) 108 (90 Base) MCG/ACT inhaler Inhale 2 puffs into the lungs every 4 (four) hours as needed for wheezing or shortness of breath (cough). (Patient not taking: Reported on 06/09/2019)   . aspirin 81 MG tablet Take 81 mg by mouth daily.   . beta carotene w/minerals (OCUVITE) tablet Take 1  tablet by mouth daily.   . Biotin 5000 MCG TABS Take 1 tablet by mouth daily.   . Cholecalciferol (VITAMIN D3) 50 MCG (2000 UT) capsule Take 1 capsule (2,000 Units total) by mouth daily.   Marland Kitchen Co-Enzyme Q-10 100 MG CAPS Take 1 capsule by mouth daily.   . fluticasone (FLONASE) 50 MCG/ACT nasal spray Place 2 sprays into both nostrils daily.   Marland Kitchen ketoconazole (NIZORAL) 2 % cream    . Krill Oil 300 MG CAPS Take by mouth daily.   Marland Kitchen loratadine (CLARITIN) 10 MG tablet Take 10 mg by mouth daily.   . Mag Aspart-Potassium Aspart (POTASSIUM & MAGNESIUM ASPARTAT PO) Take 1 tablet by mouth daily.   . Multiple Vitamin (MULTIVITAMIN) tablet Take 1 tablet by mouth daily.   . pantoprazole (PROTONIX) 40 MG tablet Take 1 tablet (40 mg total) by mouth daily before breakfast.   . raloxifene (EVISTA) 60 MG tablet Take 1 tablet (60 mg total) by mouth daily.   . rosuvastatin (CRESTOR) 5 MG tablet Take 1 tablet (5 mg total) by mouth once a week. 06/09/2019: Takes on Sunday evening  . [DISCONTINUED] Calcium Carb-Cholecalciferol (CALTRATE 600+D) 600-800 MG-UNIT TABS Take 2 tablets by mouth daily.    No facility-administered encounter medications on file as of 07/05/2019.     Goals Addressed            This Visit's Progress   . PharmD - Medication Management       Current Barriers:  Marland Kitchen Knowledge Deficits related to basic  Diabetes pathophysiology and self-care/management  Pharmacist Clinical Goal(s):  Marland Kitchen Over the next 30 days, patient will work with CM Pharmacist and PCP to address needs related to diabetes management  Interventions: . Counsel patient regarding importance of blood sugar control o Reports has increased metformin dose to 500 mg twice daily as directed.  - Reports some nausea with initial increase, but now improved - Counsel on importance of adherence for tolerability . Inquire about patient's blood pressure control o Reports taking amlodipine, hydrochlorotiazide and lisinopril as directed o Reports  needing new blood pressure monitor cuff needing to be replaced due to mechanical issue. Replacement ordered from pharmacy - Encourage patient to resume checking and keep a log when she obtains new cuff . Follow up regarding calcium supplement tolerability. Confirms switched from calcium carbonate to calcium citrate and denies any side effects with citrate form. Myles Rosenthal on heath plan benefits - [employer] over the counter benefit  Patient Self Care Activities:  . Self administers medications as prescribed o Reports using weekly pillbox as adherence tool . Attends all scheduled provider appointments . Calls pharmacy for medication refills . Calls provider office for new concerns or questions  Please see past updates related to this goal by clicking on the "Past Updates" button in the selected goal          Plan  Telephone follow up appointment with care management team member scheduled for: 10/12 at 1:30 pm   Harlow Asa, PharmD, [place] [phone]

## 2019-07-05 NOTE — Patient Instructions (Signed)
Thank you allowing the Chronic Care Management Team to be a part of your care! It was a pleasure speaking with you today!     CCM (Chronic Care Management) Team    Janci Minor RN, BSN Nurse Care Coordinator  [phone]   Harlow Asa PharmD  Clinical Pharmacist  [phone]   [name] LCSW Clinical Social Worker [phone]  Visit Information  Goals Addressed            This Visit's Progress   . PharmD - Medication Management       Current Barriers:  Marland Kitchen Knowledge Deficits related to basic Diabetes pathophysiology and self-care/management  Pharmacist Clinical Goal(s):  Marland Kitchen Over the next 30 days, patient will work with CM Pharmacist and PCP to address needs related to diabetes management  Interventions: . Counsel patient regarding importance of blood sugar control o Reports has increased metformin dose to 500 mg twice daily as directed.  - Reports some nausea with initial increase, but now improved - Counsel on importance of adherence for tolerability . Inquire about patient's blood pressure control o Reports needing new blood pressure monitor cuff needing to be replaced due to mechanical issue. Replacement ordered from pharmacy - Encourage patient to resume checking and keep a log when she obtains new cuff . Follow up regarding calcium supplement tolerability. Confirms switched from calcium carbonate to calcium citrate and denies any side effects with citrate form. Myles Rosenthal on heath plan benefits - [employer] over the counter benefit  Patient Self Care Activities:  . Self administers medications as prescribed o Reports using weekly pillbox as adherence tool . Attends all scheduled provider appointments . Calls pharmacy for medication refills . Calls provider office for new concerns or questions  Please see past updates related to this goal by clicking on the "Past Updates" button in the selected goal          The patient verbalized understanding of  instructions provided today and declined a print copy of patient instruction materials.   Telephone follow up appointment with care management team member scheduled for:  10/12 at 1:30 pm   Harlow Asa, PharmD, [place] [phone]

## 2019-07-08 ENCOUNTER — Ambulatory Visit: Payer: Medicare HMO | Admitting: Licensed Clinical Social Worker

## 2019-07-08 ENCOUNTER — Telehealth: Payer: Self-pay

## 2019-07-08 DIAGNOSIS — F411 Generalized anxiety disorder: Secondary | ICD-10-CM

## 2019-07-08 NOTE — Chronic Care Management (AMB) (Signed)
Chronic Care Management    Clinical Social Work Follow Up Note  07/08/2019 Name: Yvette [name] MRN: [id number] DOB: 03-11-1946  Yvette [name] is a 73 y.o. year old female who is a primary care patient of Olin Hauser, DO. The CCM team was consulted for assistance with Food Insecurity and Financial Difficulties related to managing health care expenses  Review of patient status, including review of consultants reports, other relevant assessments, and collaboration with appropriate care team members and the patient's provider was performed as part of comprehensive patient evaluation and provision of chronic care management services.    SDOH (Social Determinants of Health) screening performed today: Food Insecurity . See Care Plan for related entries.   Outpatient Encounter Medications as of 07/08/2019  Medication Sig Note  . acetaminophen (TYLENOL) 500 MG tablet Take 500 mg by mouth 2 (two) times daily as needed.    Marland Kitchen albuterol (PROVENTIL HFA;VENTOLIN HFA) 108 (90 Base) MCG/ACT inhaler Inhale 2 puffs into the lungs every 4 (four) hours as needed for wheezing or shortness of breath (cough). (Patient not taking: Reported on 06/09/2019)   . amLODipine (NORVASC) 10 MG tablet Take 1 tablet (10 mg total) by mouth daily.   Marland Kitchen aspirin 81 MG tablet Take 81 mg by mouth daily.   . beta carotene w/minerals (OCUVITE) tablet Take 1 tablet by mouth daily.   . Biotin 5000 MCG TABS Take 1 tablet by mouth daily.   . Calcium-Phosphorus-Vitamin D (CITRACAL +D3 PO) Take 1 Dose by mouth 2 (two) times daily.   . Cholecalciferol (VITAMIN D3) 50 MCG (2000 UT) capsule Take 1 capsule (2,000 Units total) by mouth daily.   Marland Kitchen Co-Enzyme Q-10 100 MG CAPS Take 1 capsule by mouth daily.   . fluticasone (FLONASE) 50 MCG/ACT nasal spray Place 2 sprays into both nostrils daily.   . hydrochlorothiazide (HYDRODIURIL) 12.5 MG tablet Take 1 tablet (12.5 mg total) by mouth daily.   Marland Kitchen ketoconazole (NIZORAL) 2 % cream    .  Krill Oil 300 MG CAPS Take by mouth daily.   Marland Kitchen lisinopril (ZESTRIL) 40 MG tablet Take 1 tablet (40 mg total) by mouth daily.   Marland Kitchen loratadine (CLARITIN) 10 MG tablet Take 10 mg by mouth daily.   . Mag Aspart-Potassium Aspart (POTASSIUM & MAGNESIUM ASPARTAT PO) Take 1 tablet by mouth daily.   . metFORMIN (GLUCOPHAGE) 500 MG tablet Take 1 tablet (500 mg total) by mouth 2 (two) times daily with a meal.   . Multiple Vitamin (MULTIVITAMIN) tablet Take 1 tablet by mouth daily.   . pantoprazole (PROTONIX) 40 MG tablet Take 1 tablet (40 mg total) by mouth daily before breakfast.   . raloxifene (EVISTA) 60 MG tablet Take 1 tablet (60 mg total) by mouth daily.   . rosuvastatin (CRESTOR) 5 MG tablet Take 1 tablet (5 mg total) by mouth once a week. 06/09/2019: Takes on Sunday evening   No facility-administered encounter medications on file as of 07/08/2019.      Goals Addressed    . SW- I need additional support and resource connection. I have little support since my spouse passed 4 years ago" (pt-stated)       Current Barriers:  . Acute Mental Health needs related to Anxiety and ongoing grief symptoms  . Financial constraints related to managing health care expenses and affording bills . Limited social support . Limited access to food . Level of care concerns . ADL IADL limitations . Limited access to caregiver . Inability to perform  ADL's independently . Inability to perform IADL's independently . Lacks knowledge of community resource: available food support resources  Clinical Social Work Goal(s):  Marland Kitchen Over the next 120 days, patient will work with SW bi-monthly by telephone or in person to reduce or manage symptoms related to anxiety . Over the next 120 days, client will work with SW to address concerns related to gaining additional support/resources in order to improve self-care, physical health, emotional health and overall well-being.   Interventions: . Patient interviewed and appropriate  assessments performed: brief mental health assessment . Patient interviewed and appropriate assessments performed . Provided patient with information about available financial and food support resources within the area. Patient is agreeable to [facility] referral through [facility] contract. LCSW completed referral and asked for meals to be delivered for 60 days while she is on the wait list for services.  . Discussed plans with patient for ongoing care management follow up and provided patient with direct contact information for care management team . Collaborated with [facility] and [facility] (community agency) re: referral for services. . Assisted patient/caregiver with obtaining information about health plan benefits . Provided education and assistance to client regarding Advanced Directives. . Provided education to patient/caregiver regarding level of care options. Marland Kitchen Psychoeducation and/or Health Education provided  Patient Self Care Activities:  . Attends all scheduled provider appointments . Calls provider office for new concerns or questions  . Unable to independently prepare meals for self  Initial goal documentation     Follow Up Plan: SW will follow up with patient by phone over the next quarter  [name], [place], MSW, [email or web address] Phone: [phone]

## 2019-07-15 ENCOUNTER — Telehealth: Payer: Self-pay

## 2019-07-15 ENCOUNTER — Ambulatory Visit (INDEPENDENT_AMBULATORY_CARE_PROVIDER_SITE_OTHER): Payer: Medicare HMO | Admitting: *Deleted

## 2019-07-15 DIAGNOSIS — E1169 Type 2 diabetes mellitus with other specified complication: Secondary | ICD-10-CM

## 2019-07-15 NOTE — Chronic Care Management (AMB) (Signed)
  Chronic Care Management   Outreach Note  07/15/2019 Name: Yvette [name] MRN: [id number] DOB: Mar 31, 1946  Referred by: Olin Hauser, DO Reason for referral : No chief complaint on file.   An unsuccessful telephone outreach was attempted today. The patient was referred to the case management team by for assistance with care management and care coordination.   Follow Up Plan: A HIPPA compliant phone message was left for the patient providing contact information and requesting a return call.  The care management team will reach out to the patient again over the next 30 days.   Merlene Morse Katti Pelle RN, BSN Nurse Case Pharmacist, [facility]  [phone]) [employer]

## 2019-07-19 ENCOUNTER — Ambulatory Visit: Payer: Medicare HMO | Admitting: Pharmacist

## 2019-07-19 DIAGNOSIS — I10 Essential (primary) hypertension: Secondary | ICD-10-CM

## 2019-07-19 NOTE — Chronic Care Management (AMB) (Signed)
Chronic Care Management   Follow Up Note   07/19/2019 Name: MCKENSI MARINER MRN: [id number] DOB: 07/06/1946  Referred by: Olin Hauser, DO Reason for referral : Chronic Care Management (Patient Phone Call)   BIANNCA SHIRAISHI is a 73 y.o. year old female who is a primary care patient of Olin Hauser, DO. The CCM team was consulted for assistance with chronic disease management and care coordination needs.  Ms. Randleman has a past medical history including but not limited to type 2 diabetes, hypertension, seasonal allergies, GERD, hyperlipidemia, osteoporosis and Vitamin D deficiency.  I reached out to Elie Confer by phone today.   Review of patient status, including review of consultants reports, relevant laboratory and other test results, and collaboration with appropriate care team members and the patient's provider was performed as part of comprehensive patient evaluation and provision of chronic care management services.     Outpatient Encounter Medications as of 07/19/2019  Medication Sig Note  . metFORMIN (GLUCOPHAGE) 500 MG tablet Take 1 tablet (500 mg total) by mouth 2 (two) times daily with a meal.   . acetaminophen (TYLENOL) 500 MG tablet Take 500 mg by mouth 2 (two) times daily as needed.    Marland Kitchen albuterol (PROVENTIL HFA;VENTOLIN HFA) 108 (90 Base) MCG/ACT inhaler Inhale 2 puffs into the lungs every 4 (four) hours as needed for wheezing or shortness of breath (cough). (Patient not taking: Reported on 06/09/2019)   . amLODipine (NORVASC) 10 MG tablet Take 1 tablet (10 mg total) by mouth daily.   Marland Kitchen aspirin 81 MG tablet Take 81 mg by mouth daily.   . beta carotene w/minerals (OCUVITE) tablet Take 1 tablet by mouth daily.   . Biotin 5000 MCG TABS Take 1 tablet by mouth daily.   . Calcium-Phosphorus-Vitamin D (CITRACAL +D3 PO) Take 1 Dose by mouth 2 (two) times daily.   . Cholecalciferol (VITAMIN D3) 50 MCG (2000 UT) capsule Take 1 capsule (2,000 Units total) by  mouth daily.   Marland Kitchen Co-Enzyme Q-10 100 MG CAPS Take 1 capsule by mouth daily.   . fluticasone (FLONASE) 50 MCG/ACT nasal spray Place 2 sprays into both nostrils daily.   . hydrochlorothiazide (HYDRODIURIL) 12.5 MG tablet Take 1 tablet (12.5 mg total) by mouth daily.   Marland Kitchen ketoconazole (NIZORAL) 2 % cream    . Krill Oil 300 MG CAPS Take by mouth daily.   Marland Kitchen lisinopril (ZESTRIL) 40 MG tablet Take 1 tablet (40 mg total) by mouth daily.   Marland Kitchen loratadine (CLARITIN) 10 MG tablet Take 10 mg by mouth daily.   . Mag Aspart-Potassium Aspart (POTASSIUM & MAGNESIUM ASPARTAT PO) Take 1 tablet by mouth daily.   . Multiple Vitamin (MULTIVITAMIN) tablet Take 1 tablet by mouth daily.   . pantoprazole (PROTONIX) 40 MG tablet Take 1 tablet (40 mg total) by mouth daily before breakfast.   . raloxifene (EVISTA) 60 MG tablet Take 1 tablet (60 mg total) by mouth daily.   . rosuvastatin (CRESTOR) 5 MG tablet Take 1 tablet (5 mg total) by mouth once a week. 06/09/2019: Takes on Sunday evening   No facility-administered encounter medications on file as of 07/19/2019.     Goals Addressed            This Visit's Progress   . PharmD - Medication Management       Current Barriers:  Marland Kitchen Knowledge Deficits related to basic Diabetes pathophysiology and self-care/management  Pharmacist Clinical Goal(s):  Marland Kitchen Over the next 30 days, patient  will work with CM Pharmacist and PCP to address needs related to diabetes management  Interventions: . Inquire about patient's blood pressure control o Reports still needing new blood pressure monitor cuff needing to be replaced due to mechanical issue. Replacement ordered from pharmacy was still wrong size. Checking in with pharmacy again today. - Encourage patient to resume checking and keep a log when she obtains new cuff . Counsel on heath plan benefits - [employer] over the counter benefit  Patient Self Care Activities:  . Self administers medications as prescribed o Reports using weekly  pillbox as adherence tool . Attends all scheduled provider appointments . Calls pharmacy for medication refills . Calls provider office for new concerns or questions  Please see past updates related to this goal by clicking on the "Past Updates" button in the selected goal          Plan  Telephone follow up appointment with care management team member scheduled for: 10/19 at 2:30 pm  Harlow Asa, PharmD, [place] [phone]

## 2019-07-19 NOTE — Patient Instructions (Signed)
Thank you allowing the Chronic Care Management Team to be a part of your care! It was a pleasure speaking with you today!     CCM (Chronic Care Management) Team    Janci Minor RN, BSN Nurse Care Coordinator  [phone]   Harlow Asa PharmD  Clinical Pharmacist  [phone]   [name] LCSW Clinical Social Worker [phone]  Visit Information  Goals Addressed            This Visit's Progress   . PharmD - Medication Management       Current Barriers:  Marland Kitchen Knowledge Deficits related to basic Diabetes pathophysiology and self-care/management  Pharmacist Clinical Goal(s):  Marland Kitchen Over the next 30 days, patient will work with CM Pharmacist and PCP to address needs related to diabetes management  Interventions: . Inquire about patient's blood pressure control o Reports still needing new blood pressure monitor cuff needing to be replaced due to mechanical issue. Replacement ordered from pharmacy was still wrong size. Checking in with pharmacy again today. - Encourage patient to resume checking and keep a log when she obtains new cuff . Counsel on heath plan benefits - [employer] over the counter benefit  Patient Self Care Activities:  . Self administers medications as prescribed o Reports using weekly pillbox as adherence tool . Attends all scheduled provider appointments . Calls pharmacy for medication refills . Calls provider office for new concerns or questions  Please see past updates related to this goal by clicking on the "Past Updates" button in the selected goal          The patient verbalized understanding of instructions provided today and declined a print copy of patient instruction materials.   Telephone follow up appointment with care management team member scheduled for: 10/19 at 2:30 pm  Harlow Asa, PharmD, [place] [phone]

## 2019-07-26 ENCOUNTER — Telehealth: Payer: Medicare HMO

## 2019-07-26 ENCOUNTER — Ambulatory Visit: Payer: Self-pay | Admitting: Pharmacist

## 2019-07-26 NOTE — Chronic Care Management (AMB) (Signed)
  Chronic Care Management   Follow Up Note   07/26/2019 Name: Yvette [name] MRN: [id number] DOB: [birth year]  Referred by: Olin Hauser, DO Reason for referral : Chronic Care Management (Patient Phone Call)   Yvette [name] is a 73 y.o. year old female who is a primary care patient of Olin Hauser, DO. The CCM team was consulted for assistance with chronic disease management and care coordination needs.    Was unable to reach patient via telephone today and have left HIPAA compliant voicemail asking patient to return my call.   Plan  The care management team will reach out to the patient again over the next 30 days.   Harlow Asa, PharmD, [place] [employer] [phone]

## 2019-08-05 ENCOUNTER — Telehealth: Payer: Self-pay

## 2019-08-12 ENCOUNTER — Telehealth: Payer: Medicare HMO

## 2019-08-13 ENCOUNTER — Telehealth: Payer: Self-pay

## 2019-08-13 ENCOUNTER — Ambulatory Visit: Payer: Self-pay | Admitting: Pharmacist

## 2019-08-13 NOTE — Chronic Care Management (AMB) (Signed)
  Chronic Care Management   Follow Up Note   08/13/2019 Name: Yvette [name] MRN: [id number] DOB: 12/09/1945  Referred by: Olin Hauser, DO Reason for referral : Chronic Care Management (Patient Phone Call)   Yvette [name] is a 73 y.o. year old female who is a primary care patient of Olin Hauser, DO. The CCM team was consulted for assistance with chronic disease management and care coordination needs.    Was unable to reach patient via telephone today and have left HIPAA compliant voicemail asking patient to return my call. Outreach attempt #2.   Plan  The care management team will reach out to the patient again over the next 30 days.   Harlow Asa, PharmD, [place] [employer] [phone]

## 2019-08-25 DIAGNOSIS — E119 Type 2 diabetes mellitus without complications: Secondary | ICD-10-CM | POA: Diagnosis not present

## 2019-08-30 ENCOUNTER — Ambulatory Visit (INDEPENDENT_AMBULATORY_CARE_PROVIDER_SITE_OTHER): Payer: Medicare HMO | Admitting: Family Medicine

## 2019-08-30 ENCOUNTER — Encounter: Payer: Self-pay | Admitting: Family Medicine

## 2019-08-30 ENCOUNTER — Other Ambulatory Visit: Payer: Self-pay

## 2019-08-30 ENCOUNTER — Other Ambulatory Visit: Payer: Self-pay | Admitting: Family Medicine

## 2019-08-30 VITALS — BP 146/70 | HR 72 | Temp 98.2°F | Resp 18 | Ht 66.0 in | Wt 248.4 lb

## 2019-08-30 DIAGNOSIS — E1169 Type 2 diabetes mellitus with other specified complication: Secondary | ICD-10-CM

## 2019-08-30 DIAGNOSIS — R0600 Dyspnea, unspecified: Secondary | ICD-10-CM | POA: Diagnosis not present

## 2019-08-30 DIAGNOSIS — Z23 Encounter for immunization: Secondary | ICD-10-CM

## 2019-08-30 DIAGNOSIS — I10 Essential (primary) hypertension: Secondary | ICD-10-CM

## 2019-08-30 DIAGNOSIS — Z6841 Body Mass Index (BMI) 40.0 and over, adult: Secondary | ICD-10-CM | POA: Diagnosis not present

## 2019-08-30 DIAGNOSIS — E785 Hyperlipidemia, unspecified: Secondary | ICD-10-CM

## 2019-08-30 DIAGNOSIS — M47816 Spondylosis without myelopathy or radiculopathy, lumbar region: Secondary | ICD-10-CM | POA: Diagnosis not present

## 2019-08-30 DIAGNOSIS — Z0000 Encounter for general adult medical examination without abnormal findings: Secondary | ICD-10-CM

## 2019-08-30 DIAGNOSIS — M7989 Other specified soft tissue disorders: Secondary | ICD-10-CM

## 2019-08-30 DIAGNOSIS — E538 Deficiency of other specified B group vitamins: Secondary | ICD-10-CM

## 2019-08-30 DIAGNOSIS — E559 Vitamin D deficiency, unspecified: Secondary | ICD-10-CM

## 2019-08-30 LAB — POCT GLYCOSYLATED HEMOGLOBIN (HGB A1C): Hemoglobin A1C: 6.4 % — AB (ref 4.0–5.6)

## 2019-08-30 MED ORDER — HYDROCHLOROTHIAZIDE 25 MG PO TABS: 25.0000 mg | ORAL_TABLET | Freq: Every day | ORAL | 3 refills | Status: DC

## 2019-08-30 MED ORDER — ALBUTEROL SULFATE HFA 108 (90 BASE) MCG/ACT IN AERS: 2.0000 | INHALATION_SPRAY | RESPIRATORY_TRACT | 2 refills | Status: DC | PRN

## 2019-08-30 NOTE — Assessment & Plan Note (Signed)
Clinically morbid obesity with BMI >38 with comorbid conditions Hypertension, Hyperlipidemia, Pre-Diabetes, Osteoporosis  Goals and strategy for wt loss diet primarily, low carb reduce portions, gradually increasing home exercise plan

## 2019-08-30 NOTE — Patient Instructions (Addendum)
Thank you for coming to the office today.  Recent Labs    02/17/19 0835 05/21/19 0804 08/30/19 0919  HGBA1C 6.6* 7.0* 6.4*    Continue Metformin twice a day, in future may increase if needed.  Also consider.  Call insurance find cost and coverage of the following  1. Ozempic (Semaglutide injection) 2. Bydureon BCise (Exenatide ER) 3. Trulicity (Dulaglutide)   -------------  Increase Hydrochlorothiazide 12.5mg  up to 25mg  once daily.  ----------- Try OTC anti histamine eye drops - Systane, generic Olopatadine ([employer] brand)   DUE for FASTING BLOOD WORK (no food or drink after midnight before the lab appointment, only water or coffee without cream/sugar on the morning of)  SCHEDULE "Lab Only" visit in the morning at the clinic for lab draw in 3 MONTHS   - Make sure Lab Only appointment is at about 1 week before your next appointment, so that results will be available  For Lab Results, once available within 2-3 days of blood draw, you can can log in to MyChart online to view your results and a brief explanation. Also, we can discuss results at next follow-up visit.    Please schedule a Follow-up Appointment to: Return in about 3 months (around 11/30/2019) for Annual Physical.  If you have any other questions or concerns, please feel free to call the office or send a message through [place]. You may also schedule an earlier appointment if necessary.  Additionally, you may be receiving a survey about your experience at our office within a few days to 1 week by e-mail or mail. We value your feedback.  Nobie Putnam, DO [place]

## 2019-08-30 NOTE — Progress Notes (Signed)
Subjective:    Patient ID: Yvette [name], female    DOB: 04-04-1946, 73 y.o.   MRN: [id number]  Yvette [name] is a 73 y.o. female presenting on 08/30/2019 for Diabetes   HPI   Type 2 Diabetes/ Hyperglycemia/ Morbid Obesity BMI >40 - Last visit with me 05/2019, for same problem with new diagnosis Type 2 DM treated with new start metformin, see prior notes for background information. - Interval update with improvement - Today patient reports A1c now 6.4 Meds: Metformin 500 BID Currently on ACEi, ASA 81, statin Lifestyle: - Weigh gain - Diet goal to improve diet still - Exercise: Limited due to arthritisand also more sedentary limited ability to go exercise now with COVID19 pandemic Admits had some polyuria prior, now seems to be reduced  08/2019 - Dr Wyatt Port[facility] [place], mild cataract no surgery needed, no DM retinopathy, should send record. Denies hypoglycemia, visual changes, numbness or tingling.  HYPERLIPIDEMIA - Reports concernswith cholesterol, prior history elevated. Last lipid panelrepeated 05/2019 stillabnormalelevated TG, LDLwith mild improvement On Rosuvastatin intermittent  CHRONIC HTN: Admits elevated BP - previously was on amlod 5 and hctz 25 but had polyuria Current Meds -Amlodipine10mg  daily, Lisinopril 40mg  daily, HCTZ12.5mg  daily Reportsnormally good compliance.Tolerating well, w/o complaints. Admits swelling of feet and hands over past 2 weeks, she does limit salt intake Denies CP, dyspnea, HA, dizziness / lightheadedness  Chronic Back Pain - previous flare, now improved.   Health Maintenance: Due for FLu shot today  Depression screen [facility] 2/9 08/30/2019 05/28/2019 02/24/2019  Decreased Interest 0 0 0  Down, Depressed, Hopeless 0 0 0  PHQ - 2 Score 0 0 0  Altered sleeping 0 - -  Tired, decreased energy 0 - -  Change in appetite 0 - -  Feeling bad or failure about yourself  0 - -  Trouble concentrating 0 - -  Moving slowly or  fidgety/restless 0 - -  Suicidal thoughts 0 - -  PHQ-9 Score 0 - -  Difficult doing work/chores - - -    Social History   Tobacco Use  . Smoking status: Former Smoker    Packs/day: 1.00    Years: 26.00    Pack years: 26.00    Types: Cigarettes    Quit date: 06/07/1992    Years since quitting: 27.2  . Smokeless tobacco: Never Used  Substance Use Topics  . Alcohol use: Yes    Alcohol/week: 0.0 standard drinks    Comment: occassional  . Drug use: No    Review of Systems Per HPI unless specifically indicated above     Objective:    BP (!) 146/70 (BP Location: Left Arm, Cuff Size: Large)   Pulse 72   Temp 98.2 F (36.8 C) (Oral)   Resp 18   Ht 5\' 6"  (1.676 m)   Wt 248 lb 6.4 oz (112.7 kg)   SpO2 97%   BMI 40.09 kg/m   Wt Readings from Last 3 Encounters:  08/30/19 248 lb 6.4 oz (112.7 kg)  05/28/19 236 lb (107 kg)  10/15/18 237 lb (107.5 kg)    Physical Exam Vitals signs and nursing note reviewed.  Constitutional:      General: She is not in acute distress.    Appearance: She is well-developed. She is not diaphoretic.     Comments: Well-appearing, comfortable, cooperative, comfort  HENT:     Head: Normocephalic and atraumatic.  Eyes:     General:  Right eye: No discharge.        Left eye: No discharge.     Conjunctiva/sclera: Conjunctivae normal.  Neck:     Musculoskeletal: Normal range of motion and neck supple.     Thyroid: No thyromegaly.  Cardiovascular:     Rate and Rhythm: Normal rate and regular rhythm.     Heart sounds: Normal heart sounds. No murmur.  Pulmonary:     Effort: Pulmonary effort is normal. No respiratory distress.     Breath sounds: Normal breath sounds. No wheezing or rales.  Musculoskeletal: Normal range of motion.  Lymphadenopathy:     Cervical: No cervical adenopathy.  Skin:    General: Skin is warm and dry.     Findings: No erythema or rash.  Neurological:     Mental Status: She is alert and oriented to person, place,  and time.  Psychiatric:        Behavior: Behavior normal.     Comments: Well groomed, good eye contact, normal speech and thoughts       Recent Labs    02/17/19 0835 05/21/19 0804 08/30/19 0919  HGBA1C 6.6* 7.0* 6.4*    Results for orders placed or performed in visit on 08/30/19  [place] - Hemoglobin A1c POCT (Hb A1C)  Result Value Ref Range   Hemoglobin A1C 6.4 (A) 4.0 - 5.6 %      Assessment & Plan:   Problem List Items Addressed This Visit    Type 2 diabetes mellitus with other specified complication ([place]) - Primary    Improved control now A1c 6.4 Improving lifestyle, but weight gain, now on medicine Concern with obesity, HTN, HLD  Plan:  1. Continue Metformin IR 500mg  BID - future dose adjust as needed 2. Encourage improved lifestyle - low carb, low sugar diet, reduce portion size, continue improving regular exercise - home exercise regimen - request to get children to help with getting fresh groceries to limit take out - Discussed GLP1 for future use      Relevant Orders   [facility] - Hemoglobin A1c POCT (Hb A1C) (Completed)   Spondylosis of lumbar region without myelopathy or radiculopathy   Morbid obesity ([place])    Clinically morbid obesity with BMI >38 with comorbid conditions Hypertension, Hyperlipidemia, Pre-Diabetes, Osteoporosis  Goals and strategy for wt loss diet primarily, low carb reduce portions, gradually increasing home exercise plan      Essential hypertension    Elevated BP  Adjust BP meds Increase HCTZ from 12.5 up to 25mg  daily for improved diuresis and BP control Continue other meds Amlodipine 10 and Lisionpril 40 Monitor BP Follow-up as planned      Relevant Medications   hydrochlorothiazide (HYDRODIURIL) 25 MG tablet   Dyspnea    Improved overall Refill Albuterol PRN      Relevant Medications   albuterol (VENTOLIN HFA) 108 (90 Base) MCG/ACT inhaler    Other Visit Diagnoses    Needs flu shot       Relevant Orders   Flu Vaccine QUAD  High Dose(Fluad) (Completed)   Bilateral swelling of feet          Meds ordered this encounter  Medications  . hydrochlorothiazide (HYDRODIURIL) 25 MG tablet    Sig: Take 1 tablet (25 mg total) by mouth daily.    Dispense:  90 tablet    Refill:  3  . albuterol (VENTOLIN HFA) 108 (90 Base) MCG/ACT inhaler    Sig: Inhale 2 puffs into the lungs every 4 (four) hours  as needed for wheezing or shortness of breath (cough).    Dispense:  6.7 g    Refill:  2     Follow up plan: Return in about 3 months (around 11/30/2019) for Annual Physical.  Future labs ordered for 2/21   Nobie Putnam, DO [facility] 08/30/2019, 9:11 AM

## 2019-08-30 NOTE — Assessment & Plan Note (Signed)
Improved overall Refill Albuterol PRN

## 2019-08-30 NOTE — Assessment & Plan Note (Signed)
Elevated BP  Adjust BP meds Increase HCTZ from 12.5 up to 25mg  daily for improved diuresis and BP control Continue other meds Amlodipine 10 and Lisionpril 40 Monitor BP Follow-up as planned

## 2019-08-30 NOTE — Assessment & Plan Note (Signed)
Improved control now A1c 6.4 Improving lifestyle, but weight gain, now on medicine Concern with obesity, HTN, HLD  Plan:  1. Continue Metformin IR 500mg  BID - future dose adjust as needed 2. Encourage improved lifestyle - low carb, low sugar diet, reduce portion size, continue improving regular exercise - home exercise regimen - request to get children to help with getting fresh groceries to limit take out - Discussed GLP1 for future use

## 2019-08-30 NOTE — Addendum Note (Signed)
Addended by: Olin Hauser on: 08/30/2019 10:34 PM   Modules accepted: Orders

## 2019-09-05 DIAGNOSIS — K219 Gastro-esophageal reflux disease without esophagitis: Secondary | ICD-10-CM

## 2019-09-06 ENCOUNTER — Telehealth: Payer: Self-pay

## 2019-09-06 MED ORDER — PANTOPRAZOLE SODIUM 40 MG PO TBEC: 40.0000 mg | DELAYED_RELEASE_TABLET | Freq: Every day | ORAL | 1 refills | Status: DC

## 2019-09-07 ENCOUNTER — Ambulatory Visit (INDEPENDENT_AMBULATORY_CARE_PROVIDER_SITE_OTHER): Payer: Medicare HMO | Admitting: Pharmacist

## 2019-09-07 DIAGNOSIS — I10 Essential (primary) hypertension: Secondary | ICD-10-CM

## 2019-09-07 DIAGNOSIS — E785 Hyperlipidemia, unspecified: Secondary | ICD-10-CM

## 2019-09-07 DIAGNOSIS — E1169 Type 2 diabetes mellitus with other specified complication: Secondary | ICD-10-CM | POA: Diagnosis not present

## 2019-09-07 DIAGNOSIS — J3089 Other allergic rhinitis: Secondary | ICD-10-CM

## 2019-09-07 NOTE — Chronic Care Management (AMB) (Signed)
Chronic Care Management   Follow Up Note   09/07/2019 Name: KERSTI DELVECCHIO MRN: [id number] DOB: 03/19/1946  Referred by: Olin Hauser, DO Reason for referral : Chronic Care Management (Patient Phone Call)   DAWNE COREA is a 73 y.o. year old female who is a primary care patient of Olin Hauser, DO. The CCM team was consulted for assistance with chronic disease management and care coordination needs.  Ms. Hofacker has a past medical history including but not limited to type 2 diabetes, hypertension, seasonal allergies, GERD, hyperlipidemia, osteoporosis and Vitamin D deficiency.  I reached out to Elie Confer by phone today.   Review of patient status, including review of consultants reports, relevant laboratory and other test results, and collaboration with appropriate care team members and the patient's provider was performed as part of comprehensive patient evaluation and provision of chronic care management services.     Outpatient Encounter Medications as of 09/07/2019  Medication Sig Note  . amLODipine (NORVASC) 10 MG tablet Take 1 tablet (10 mg total) by mouth daily.   . fluticasone (FLONASE) 50 MCG/ACT nasal spray Place 2 sprays into both nostrils daily.   . hydrochlorothiazide (HYDRODIURIL) 25 MG tablet Take 1 tablet (25 mg total) by mouth daily.   Marland Kitchen lisinopril (ZESTRIL) 40 MG tablet Take 1 tablet (40 mg total) by mouth daily.   Marland Kitchen loratadine (CLARITIN) 10 MG tablet Take 10 mg by mouth daily.   . metFORMIN (GLUCOPHAGE) 500 MG tablet Take 1 tablet (500 mg total) by mouth 2 (two) times daily with a meal.   . rosuvastatin (CRESTOR) 5 MG tablet Take 1 tablet (5 mg total) by mouth once a week. 06/09/2019: Takes on Sunday evening  . acetaminophen (TYLENOL) 500 MG tablet Take 500 mg by mouth 2 (two) times daily as needed.    Marland Kitchen albuterol (VENTOLIN HFA) 108 (90 Base) MCG/ACT inhaler Inhale 2 puffs into the lungs every 4 (four) hours as needed for wheezing or shortness  of breath (cough).   Marland Kitchen aspirin 81 MG tablet Take 81 mg by mouth daily.   . beta carotene w/minerals (OCUVITE) tablet Take 1 tablet by mouth daily.   . Biotin 5000 MCG TABS Take 1 tablet by mouth daily.   . Calcium-Phosphorus-Vitamin D (CITRACAL +D3 PO) Take 1 Dose by mouth 2 (two) times daily.   . Cholecalciferol (VITAMIN D3) 50 MCG (2000 UT) capsule Take 1 capsule (2,000 Units total) by mouth daily.   Marland Kitchen Co-Enzyme Q-10 100 MG CAPS Take 1 capsule by mouth daily.   Marland Kitchen ketoconazole (NIZORAL) 2 % cream    . Krill Oil 300 MG CAPS Take by mouth daily.   . Mag Aspart-Potassium Aspart (POTASSIUM & MAGNESIUM ASPARTAT PO) Take 1 tablet by mouth daily.   . Multiple Vitamin (MULTIVITAMIN) tablet Take 1 tablet by mouth daily.   . pantoprazole (PROTONIX) 40 MG tablet Take 1 tablet (40 mg total) by mouth daily before breakfast.   . raloxifene (EVISTA) 60 MG tablet Take 1 tablet (60 mg total) by mouth daily. (Patient not taking: Reported on 08/30/2019)    No facility-administered encounter medications on file as of 09/07/2019.     Goals Addressed            This Visit's Progress   . PharmD - Medication Management       Current Barriers:  Marland Kitchen Knowledge Deficits related to basic Diabetes pathophysiology and self-care/management  Pharmacist Clinical Goal(s):  Marland Kitchen Over the next 30 days, patient will  work with [employer] Pharmacist and PCP to address needs related to diabetes management  Interventions: . Perform chart review o Note patient seen 11/23 by PCP - PCP advised to increase HCTZ from 12.5 up to 25mg  daily for improved diuresis and BP control . Counsel on importance of blood pressure monitoring and control o Reports currently taking: - amlodipine 10 mg QAM - hydrochlorothiazide 25 mg QAM (increased from 12.5 mg on 11/29) - lisinopril 40 mg QAM o Confirms obtained new BP cuff  o Review recent blood pressure results (see below)  o Reports swelling in feet resolved with start of HCTZ and possible  reduction in dietary sodium - Reports had noticed food received from [place] on Wheels to be salty. Decided to stop receiving for now. Reports getting back to cooking for herself so she can better control her nutrition, including salt intake o Review blood pressure monitoring technique . Patient reports has occasionally experienced dizziness when standing too quickly o Counsel patient to make positional changes more gradually - pausing after going from laying down to sitting, sitting to standing and once standing. o Encourage patient to follow up with PCP if continues or becomes worse . Counsel on strategies for COVID-19 prevention including social distancing, face masking and handwashing. . Provide counseling on over the counter options for seasonal allergy management o Reports symptom relief with fluticasone nasal spray o Patient expresses interest in trying fexofenadine as an alternative to loratadine. States previously found cetirizine ineffective.  Patient Self Care Activities:  . Self administers medications as prescribed o Reports using weekly pillbox as adherence tool . Attends all scheduled provider appointments . Calls pharmacy for medication refills . Calls provider office for new concerns or questions . Keep log of blood pressure results Date AM BP PM BP Notes  25 - November 128/73, HR 74 123/67, HR 79   26 - November 146/67, HR 68 138/71, HR 82   27 - November 115/70, HR 84 134/63, HR 83   28 - November 119/68, HR 73 -   29 - November 130/64, HR 77 145/72, HR 85 *Increased HCTZ dose to 25 mg QAM  30 - November 142/69, HR 73 140/72, HR 86   1 - December -      Please see past updates related to this goal by clicking on the "Past Updates" button in the selected goal          Plan  Telephone follow up appointment with care management team member scheduled for: 1/4 at 2 pm   Harlow Asa, PharmD, [place] [phone]

## 2019-09-07 NOTE — Patient Instructions (Signed)
Thank you allowing the Chronic Care Management Team to be a part of your care! It was a pleasure speaking with you today!     CCM (Chronic Care Management) Team    Janci Minor RN, BSN Nurse Care Coordinator  [phone]   Harlow Asa PharmD  Clinical Pharmacist  [phone]   [name] LCSW Clinical Social Worker [phone]  Visit Information  Goals Addressed            This Visit's Progress   . PharmD - Medication Management       Current Barriers:  Marland Kitchen Knowledge Deficits related to basic Diabetes pathophysiology and self-care/management  Pharmacist Clinical Goal(s):  Marland Kitchen Over the next 30 days, patient will work with CM Pharmacist and PCP to address needs related to diabetes management  Interventions: . Perform chart review o Note patient seen 11/23 by PCP - PCP advised to increase HCTZ from 12.5 up to 25mg  daily for improved diuresis and BP control . Counsel on importance of blood pressure monitoring and control o Reports currently taking: - amlodipine 10 mg QAM - hydrochlorothiazide 25 mg QAM (increased from 12.5 mg on 11/29) - lisinopril 40 mg QAM o Confirms obtained new BP cuff  o Review recent blood pressure results (see below)  o Reports swelling in feet resolved with start of HCTZ and possible reduction in dietary sodium - Reports had noticed food received from [place] on Wheels to be salty. Decided to stop receiving for now. Reports getting back to cooking for herself so she can better control her nutrition, including salt intake o Review blood pressure monitoring technique . Patient reports has occasionally experienced dizziness when standing too quickly o Counsel patient to make positional changes more gradually - pausing after going from laying down to sitting, sitting to standing and once standing. o Encourage patient to follow up with PCP if continues or becomes worse . Counsel on strategies for COVID-19 prevention including social distancing,  face masking and handwashing. . Provide counseling on over the counter options for seasonal allergy management o Reports symptom relief with fluticasone nasal spray o Patient expresses interest in trying fexofenadine as an alternative to loratadine. States previously found cetirizine ineffective.  Patient Self Care Activities:  . Self administers medications as prescribed o Reports using weekly pillbox as adherence tool . Attends all scheduled provider appointments . Calls pharmacy for medication refills . Calls provider office for new concerns or questions . Keep log of blood pressure results Date AM BP PM BP Notes  25 - November 128/73, HR 74 123/67, HR 79   26 - November 146/67, HR 68 138/71, HR 82   27 - November 115/70, HR 84 134/63, HR 83   28 - November 119/68, HR 73 -   29 - November 130/64, HR 77 145/72, HR 85 *Increased HCTZ dose to 25 mg QAM  30 - November 142/69, HR 73 140/72, HR 86   1 - December -      Please see past updates related to this goal by clicking on the "Past Updates" button in the selected goal          The patient verbalized understanding of instructions provided today and declined a print copy of patient instruction materials.   Telephone follow up appointment with care management team member scheduled for: 1/4 at 2 pm   Harlow Asa, PharmD, [place] [phone]

## 2019-09-23 ENCOUNTER — Telehealth: Payer: Self-pay | Admitting: Family Medicine

## 2019-09-23 NOTE — Telephone Encounter (Signed)
    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 Patient stated that she just asked to be taken off the route for MOW she had been receiving since October. She is feeling stronger and is able to prepare her own meals at this point, does not need further assistance. She indicated that she did not need financial help and I told her that I would pass along the [employer] guide should she need it. Closing referral pending any other needs of patient.  [place] . Embedded Care Coordination [place]  Care Management ??[email or web address]  ??[phone]

## 2019-09-23 NOTE — Telephone Encounter (Signed)
MyChart msg Good Afternoon Ms. Yvette [name], Thank you for speaking with me this afternoon regarding community resources. I'm glad to hear that you have regained your strength and are able to prepare meals for yourself now. I will notate that you have requested the Meals on Wheels route to be on hold for now.  Below is the link for [employer] https://www.[place].com/resources they update their resource list yearly with organizations in the county that help assist seniors. Please don't hesitate to reach out to me if I can be of further assistance.  All the best,  [place] . Embedded Care Coordination [place]  Care Management ??[email or web address] ??[phone]    This MyChart message has not been read.

## 2019-09-24 NOTE — Telephone Encounter (Signed)
Dr. Raliegh Ip- Please see below.

## 2019-09-26 DIAGNOSIS — Z20828 Contact with and (suspected) exposure to other viral communicable diseases: Secondary | ICD-10-CM | POA: Diagnosis not present

## 2019-10-11 ENCOUNTER — Ambulatory Visit: Payer: Medicare HMO | Admitting: Pharmacist

## 2019-10-11 DIAGNOSIS — I10 Essential (primary) hypertension: Secondary | ICD-10-CM

## 2019-10-11 DIAGNOSIS — E1169 Type 2 diabetes mellitus with other specified complication: Secondary | ICD-10-CM

## 2019-10-11 NOTE — Chronic Care Management (AMB) (Signed)
Chronic Care Management   Follow Up Note   10/11/2019 Name: ASHRITHA PINS MRN: [id number] DOB: Oct 24, 1945  Referred by: Olin Hauser, DO Reason for referral : Chronic Care Management (Patient Phone Call)   ROSMERI PEETZ is a 74 y.o. year old female who is a primary care patient of Olin Hauser, DO. The CCM team was consulted for assistance with chronic disease management and care coordination needs.  Ms. Lupo has a past medical history including but not limited to type 2 diabetes, hypertension, seasonal allergies, GERD, hyperlipidemia, osteoporosis and Vitamin D deficiency.  I reached out to Elie Confer by phone today.   Review of patient status, including review of consultants reports, relevant laboratory and other test results, and collaboration with appropriate care team members and the patient's provider was performed as part of comprehensive patient evaluation and provision of chronic care management services.     Outpatient Encounter Medications as of 10/11/2019  Medication Sig Note  . acetaminophen (TYLENOL) 500 MG tablet Take 500 mg by mouth 2 (two) times daily as needed.    Marland Kitchen albuterol (VENTOLIN HFA) 108 (90 Base) MCG/ACT inhaler Inhale 2 puffs into the lungs every 4 (four) hours as needed for wheezing or shortness of breath (cough).   Marland Kitchen amLODipine (NORVASC) 10 MG tablet Take 1 tablet (10 mg total) by mouth daily.   Marland Kitchen aspirin 81 MG tablet Take 81 mg by mouth daily.   . beta carotene w/minerals (OCUVITE) tablet Take 1 tablet by mouth daily.   . Biotin 5000 MCG TABS Take 1 tablet by mouth daily.   . Calcium-Phosphorus-Vitamin D (CITRACAL +D3 PO) Take 1 Dose by mouth 2 (two) times daily.   . Cholecalciferol (VITAMIN D3) 50 MCG (2000 UT) capsule Take 1 capsule (2,000 Units total) by mouth daily.   Marland Kitchen Co-Enzyme Q-10 100 MG CAPS Take 1 capsule by mouth daily.   . fluticasone (FLONASE) 50 MCG/ACT nasal spray Place 2 sprays into both nostrils daily.   .  hydrochlorothiazide (HYDRODIURIL) 25 MG tablet Take 1 tablet (25 mg total) by mouth daily.   Marland Kitchen ketoconazole (NIZORAL) 2 % cream    . Krill Oil 300 MG CAPS Take by mouth daily.   Marland Kitchen lisinopril (ZESTRIL) 40 MG tablet Take 1 tablet (40 mg total) by mouth daily.   Marland Kitchen loratadine (CLARITIN) 10 MG tablet Take 10 mg by mouth daily.   . Mag Aspart-Potassium Aspart (POTASSIUM & MAGNESIUM ASPARTAT PO) Take 1 tablet by mouth daily.   . metFORMIN (GLUCOPHAGE) 500 MG tablet Take 1 tablet (500 mg total) by mouth 2 (two) times daily with a meal.   . Multiple Vitamin (MULTIVITAMIN) tablet Take 1 tablet by mouth daily.   . pantoprazole (PROTONIX) 40 MG tablet Take 1 tablet (40 mg total) by mouth daily before breakfast.   . raloxifene (EVISTA) 60 MG tablet Take 1 tablet (60 mg total) by mouth daily. (Patient not taking: Reported on 08/30/2019)   . rosuvastatin (CRESTOR) 5 MG tablet Take 1 tablet (5 mg total) by mouth once a week. (Patient not taking: Reported on 10/11/2019) 06/09/2019: Takes on Sunday evening   No facility-administered encounter medications on file as of 10/11/2019.    Goals Addressed            This Visit's Progress   . PharmD - Medication Management       Current Barriers:  Marland Kitchen Knowledge Deficits related to basic Diabetes pathophysiology and self-care/management  Pharmacist Clinical Goal(s):  Marland Kitchen Over the  next 30 days, patient will work with CM Pharmacist and PCP to address needs related to diabetes management  Interventions: . Follow up with patient regarding statin side effects o Ms. Lemert reports having continued off of rosuvastatin since messaging PCP regarding leg weakness on 12/18 o Reports her legs have been "somewhat better" since stopping rosuvastatin o States she plans to stay off of statin until next appointment with PCP in February and then discuss with him at that time . Follow up with patient regarding importance of blood pressure control and monitoring o Ms. Deberry unable to  provide BP results at this time (record not with her). Schedule appointment to review these numbers o Review counseling for blood pressure monitoring technique  Patient Self Care Activities:  . Self administers medications as prescribed o Reports using weekly pillbox as adherence tool . Attends all scheduled provider appointments . Calls pharmacy for medication refills . Calls provider office for new concerns or questions . Keep log of blood pressure results   Please see past updates related to this goal by clicking on the "Past Updates" button in the selected goal          Plan  Telephone follow up appointment with care management team member scheduled for: 1/14 at 2:30 pm  Harlow Asa, PharmD, [place] [phone]

## 2019-10-11 NOTE — Patient Instructions (Signed)
Thank you allowing the Chronic Care Management Team to be a part of your care! It was a pleasure speaking with you today!     CCM (Chronic Care Management) Team    Janci Minor RN, BSN Nurse Care Coordinator  [phone]   Harlow Asa PharmD  Clinical Pharmacist  [phone]   [name] LCSW Clinical Social Worker [phone]  Visit Information  Goals Addressed            This Visit's Progress   . PharmD - Medication Management       Current Barriers:  Marland Kitchen Knowledge Deficits related to basic Diabetes pathophysiology and self-care/management  Pharmacist Clinical Goal(s):  Marland Kitchen Over the next 30 days, patient will work with CM Pharmacist and PCP to address needs related to diabetes management  Interventions: . Follow up with patient regarding statin side effects o Ms. Ervine reports having continued off of rosuvastatin since messaging PCP regarding leg weakness on 12/18 o Reports her legs have been "somewhat better" since stopping rosuvastatin o States she plans to stay off of statin until next appointment with PCP in February and then discuss with him at that time . Follow up with patient regarding importance of blood pressure control and monitoring o Ms. Quin unable to provide BP results at this time (record not with her). Schedule appointment to review these numbers o Review counseling for blood pressure monitoring technique  Patient Self Care Activities:  . Self administers medications as prescribed o Reports using weekly pillbox as adherence tool . Attends all scheduled provider appointments . Calls pharmacy for medication refills . Calls provider office for new concerns or questions . Keep log of blood pressure results   Please see past updates related to this goal by clicking on the "Past Updates" button in the selected goal          The patient verbalized understanding of instructions provided today and declined a print copy of patient  instruction materials.   Telephone follow up appointment with care management team member scheduled for: 1/14 at 2:30 pm  Harlow Asa, PharmD, [place] [phone]

## 2019-10-18 ENCOUNTER — Telehealth: Payer: Self-pay

## 2019-10-21 ENCOUNTER — Telehealth: Payer: Medicare HMO

## 2019-10-21 ENCOUNTER — Telehealth: Payer: Self-pay

## 2019-10-21 ENCOUNTER — Ambulatory Visit: Payer: Self-pay | Admitting: Pharmacist

## 2019-10-21 NOTE — Chronic Care Management (AMB) (Signed)
  Chronic Care Management   Follow Up Note   10/21/2019 Name: Yvette [name] MRN: [id number] DOB: [birth year]  Referred by: Olin Hauser, DO Reason for referral : Chronic Care Management (Patient Phone Call)   Yvette [name] is a 74 y.o. year old female who is a primary care patient of Olin Hauser, DO. The CCM team was consulted for assistance with chronic disease management and care coordination needs.    Was unable to reach patient via telephone today and have left HIPAA compliant voicemail asking patient to return my call.    Plan  The care management team will reach out to the patient again over the next 30 days.   Harlow Asa, PharmD, [place] [employer] [phone]

## 2019-11-08 ENCOUNTER — Telehealth: Payer: Medicare HMO

## 2019-11-09 ENCOUNTER — Ambulatory Visit: Payer: Self-pay | Admitting: Pharmacist

## 2019-11-09 ENCOUNTER — Telehealth: Payer: Medicare HMO

## 2019-11-09 ENCOUNTER — Ambulatory Visit (INDEPENDENT_AMBULATORY_CARE_PROVIDER_SITE_OTHER): Payer: Medicare HMO | Admitting: Licensed Clinical Social Worker

## 2019-11-09 DIAGNOSIS — I10 Essential (primary) hypertension: Secondary | ICD-10-CM | POA: Diagnosis not present

## 2019-11-09 DIAGNOSIS — E1169 Type 2 diabetes mellitus with other specified complication: Secondary | ICD-10-CM | POA: Diagnosis not present

## 2019-11-09 DIAGNOSIS — F419 Anxiety disorder, unspecified: Secondary | ICD-10-CM

## 2019-11-09 DIAGNOSIS — F411 Generalized anxiety disorder: Secondary | ICD-10-CM

## 2019-11-09 NOTE — Chronic Care Management (AMB) (Signed)
  Chronic Care Management   Follow Up Note   11/09/2019 Name: Yvette [name] MRN: [id number] DOB: 15-Sep-1946  Referred by: Olin Hauser, DO Reason for referral : Chronic Care Management (Patient Phone Call)   Yvette [name] is a 74 y.o. year old female who is a primary care patient of Olin Hauser, DO. The CCM team was consulted for assistance with chronic disease management and care coordination needs.    Was unable to reach patient via telephone today and have left HIPAA compliant voicemail asking patient to return my call. Outreach attempt #2.   Plan  The care management team will reach out to the patient again over the next 30 days.   Harlow Asa, PharmD, [place] [employer] [phone]

## 2019-11-10 NOTE — Chronic Care Management (AMB) (Signed)
Chronic Care Management    Clinical Social Work Follow Up Note  11/10/2019 Name: Yvette [name] MRN: [id number] DOB: 28-Dec-1945  Yvette [name] is a 74 y.o. year old female who is a primary care patient of Olin Hauser, DO. The CCM team was consulted for assistance with Grief Counseling.   Review of patient status, including review of consultants reports, other relevant assessments, and collaboration with appropriate care team members and the patient's provider was performed as part of comprehensive patient evaluation and provision of chronic care management services.    SDOH (Social Determinants of Health) screening performed today: Depression  . See Care Plan for related entries.   Advanced Directives Status: <no information> See Care Plan for related entries.   Outpatient Encounter Medications as of 11/09/2019  Medication Sig Note  . acetaminophen (TYLENOL) 500 MG tablet Take 500 mg by mouth 2 (two) times daily as needed.    Marland Kitchen albuterol (VENTOLIN HFA) 108 (90 Base) MCG/ACT inhaler Inhale 2 puffs into the lungs every 4 (four) hours as needed for wheezing or shortness of breath (cough).   Marland Kitchen amLODipine (NORVASC) 10 MG tablet Take 1 tablet (10 mg total) by mouth daily.   Marland Kitchen aspirin 81 MG tablet Take 81 mg by mouth daily.   . beta carotene w/minerals (OCUVITE) tablet Take 1 tablet by mouth daily.   . Biotin 5000 MCG TABS Take 1 tablet by mouth daily.   . Calcium-Phosphorus-Vitamin D (CITRACAL +D3 PO) Take 1 Dose by mouth 2 (two) times daily.   . Cholecalciferol (VITAMIN D3) 50 MCG (2000 UT) capsule Take 1 capsule (2,000 Units total) by mouth daily.   Marland Kitchen Co-Enzyme Q-10 100 MG CAPS Take 1 capsule by mouth daily.   . fluticasone (FLONASE) 50 MCG/ACT nasal spray Place 2 sprays into both nostrils daily.   . hydrochlorothiazide (HYDRODIURIL) 25 MG tablet Take 1 tablet (25 mg total) by mouth daily.   Marland Kitchen ketoconazole (NIZORAL) 2 % cream    . Krill Oil 300 MG CAPS Take by mouth daily.   Marland Kitchen  lisinopril (ZESTRIL) 40 MG tablet Take 1 tablet (40 mg total) by mouth daily.   Marland Kitchen loratadine (CLARITIN) 10 MG tablet Take 10 mg by mouth daily.   . Mag Aspart-Potassium Aspart (POTASSIUM & MAGNESIUM ASPARTAT PO) Take 1 tablet by mouth daily.   . metFORMIN (GLUCOPHAGE) 500 MG tablet Take 1 tablet (500 mg total) by mouth 2 (two) times daily with a meal.   . Multiple Vitamin (MULTIVITAMIN) tablet Take 1 tablet by mouth daily.   . pantoprazole (PROTONIX) 40 MG tablet Take 1 tablet (40 mg total) by mouth daily before breakfast.   . raloxifene (EVISTA) 60 MG tablet Take 1 tablet (60 mg total) by mouth daily. (Patient not taking: Reported on 08/30/2019)   . rosuvastatin (CRESTOR) 5 MG tablet Take 1 tablet (5 mg total) by mouth once a week. (Patient not taking: Reported on 10/11/2019) 06/09/2019: Takes on Sunday evening   No facility-administered encounter medications on file as of 11/09/2019.     Goals Addressed    . SW- I need additional support and resource connection. I have little support since my spouse passed 4 years ago" (pt-stated)       Current Barriers:  . Acute Mental Health needs related to Anxiety and ongoing grief symptoms  . Financial constraints related to managing health care expenses and affording bills . Limited social support . Limited access to food . Level of care concerns . ADL IADL limitations .  Limited access to caregiver . Inability to perform ADL's independently . Inability to perform IADL's independently . Lacks knowledge of community resource: available food support resources  Clinical Social Work Goal(s):  Marland Kitchen Over the next 120 days, patient will work with SW bi-monthly by telephone or in person to reduce or manage symptoms related to anxiety . Over the next 120 days, client will work with SW to address concerns related to gaining additional support/resources in order to improve self-care, physical health, emotional health and overall well-being.    Interventions: . Patient interviewed and appropriate assessments performed: brief mental health assessment . Patient interviewed and appropriate assessments performed . Provided patient with information about available financial and food support resources within the area. Patient is agreeable to [facility] referral through [facility] contract. LCSW completed referral and asked for meals to be delivered for 60 days while she is on the wait list for services. UPDATE- Patient is no longer receiving meals but is on wait list. Patient reports that she is actually cooking again and does not need meal prep assistance at this time. Patient reports that she will be cooking ahi tuna steak tonight for herself.   . Discussed plans with patient for ongoing care management follow up and provided patient with direct contact information for care management team . Assisted patient/caregiver with obtaining information about health plan benefits . Provided education and assistance to client regarding Advanced Directives. . Provided education to patient/caregiver regarding level of care options. . 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  Marland Kitchen Psychoeducation and/or Health Education provided  Patient Self Care Activities:  . Attends all scheduled provider appointments . Calls provider office for new concerns or questions  . Unable to independently prepare meals for self  Please see past updates related to this goal by clicking on the "Past Updates" button in the selected goal      Follow Up Plan: SW will follow up with patient by phone over the next quarter  [name], [place], MSW, [email or web address] Phone: [phone]

## 2019-11-19 ENCOUNTER — Telehealth: Payer: Medicare HMO

## 2019-11-19 ENCOUNTER — Ambulatory Visit: Payer: Self-pay | Admitting: Pharmacist

## 2019-11-19 NOTE — Chronic Care Management (AMB) (Signed)
  Chronic Care Management   Follow Up Note   11/19/2019 Name: Yvette [name] MRN: [id number] DOB: 01-13-1946  Referred by: Olin Hauser, DO Reason for referral : Chronic Care Management (Patient Phone Call)   [name] is a 74 y.o. year old female who is a primary care patient of Olin Hauser, DO. The CCM team was consulted for assistance with chronic disease management and care coordination needs.    Was unable to reach patient via telephone today and have left HIPAA compliant voicemail asking patient to return my call. Outreach attempt #3.   Plan  The care management team is available to follow up with the patient after provider conversation with the patient regarding recommendation for care management engagement and subsequent re-referral to the care management team.   Harlow Asa, PharmD, [place] [phone]

## 2019-11-25 ENCOUNTER — Other Ambulatory Visit: Payer: Medicare HMO

## 2019-11-29 ENCOUNTER — Other Ambulatory Visit: Payer: Medicare HMO

## 2019-11-29 DIAGNOSIS — E785 Hyperlipidemia, unspecified: Secondary | ICD-10-CM | POA: Diagnosis not present

## 2019-11-29 DIAGNOSIS — Z0000 Encounter for general adult medical examination without abnormal findings: Secondary | ICD-10-CM | POA: Diagnosis not present

## 2019-11-29 DIAGNOSIS — E538 Deficiency of other specified B group vitamins: Secondary | ICD-10-CM | POA: Diagnosis not present

## 2019-11-29 DIAGNOSIS — E1169 Type 2 diabetes mellitus with other specified complication: Secondary | ICD-10-CM | POA: Diagnosis not present

## 2019-11-29 DIAGNOSIS — I10 Essential (primary) hypertension: Secondary | ICD-10-CM | POA: Diagnosis not present

## 2019-11-29 DIAGNOSIS — E559 Vitamin D deficiency, unspecified: Secondary | ICD-10-CM | POA: Diagnosis not present

## 2019-11-30 ENCOUNTER — Ambulatory Visit (INDEPENDENT_AMBULATORY_CARE_PROVIDER_SITE_OTHER): Payer: Medicare HMO

## 2019-11-30 VITALS — BP 148/74 | HR 65 | Ht 66.0 in | Wt 248.4 lb

## 2019-11-30 DIAGNOSIS — Z1231 Encounter for screening mammogram for malignant neoplasm of breast: Secondary | ICD-10-CM

## 2019-11-30 DIAGNOSIS — Z0000 Encounter for general adult medical examination without abnormal findings: Secondary | ICD-10-CM

## 2019-11-30 DIAGNOSIS — Z1211 Encounter for screening for malignant neoplasm of colon: Secondary | ICD-10-CM

## 2019-11-30 LAB — CBC WITH DIFFERENTIAL/PLATELET
Absolute Monocytes: 402 cells/uL (ref 200–950)
Basophils Absolute: 31 cells/uL (ref 0–200)
Basophils Relative: 0.8 %
Eosinophils Absolute: 82 cells/uL (ref 15–500)
Eosinophils Relative: 2.1 %
HCT: 38.6 % (ref 35.0–45.0)
Hemoglobin: 12.9 g/dL (ref 11.7–15.5)
Lymphs Abs: 1115 cells/uL (ref 850–3900)
MCH: 30.4 pg (ref 27.0–33.0)
MCHC: 33.4 g/dL (ref 32.0–36.0)
MCV: 90.8 fL (ref 80.0–100.0)
MPV: 11.5 fL (ref 7.5–12.5)
Monocytes Relative: 10.3 %
Neutro Abs: 2270 cells/uL (ref 1500–7800)
Neutrophils Relative %: 58.2 %
Platelets: 166 10*3/uL (ref 140–400)
RBC: 4.25 10*6/uL (ref 3.80–5.10)
RDW: 13.5 % (ref 11.0–15.0)
Total Lymphocyte: 28.6 %
WBC: 3.9 10*3/uL (ref 3.8–10.8)

## 2019-11-30 LAB — HEMOGLOBIN A1C
Hgb A1c MFr Bld: 6.5 % of total Hgb — ABNORMAL HIGH (ref <5.7)
Mean Plasma Glucose: 140 (calc)
eAG (mmol/L): 7.7 (calc)

## 2019-11-30 LAB — COMPLETE METABOLIC PANEL WITH GFR
AG Ratio: 1.9 (calc) (ref 1.0–2.5)
ALT: 53 U/L — ABNORMAL HIGH (ref 6–29)
AST: 63 U/L — ABNORMAL HIGH (ref 10–35)
Albumin: 4.1 g/dL (ref 3.6–5.1)
Alkaline phosphatase (APISO): 51 U/L (ref 37–153)
BUN: 9 mg/dL (ref 7–25)
CO2: 28 mmol/L (ref 20–32)
Calcium: 9.3 mg/dL (ref 8.6–10.4)
Chloride: 104 mmol/L (ref 98–110)
Creat: 0.63 mg/dL (ref 0.60–0.93)
GFR, Est African American: 102 mL/min/{1.73_m2} (ref >=60)
GFR, Est Non African American: 88 mL/min/{1.73_m2} (ref >=60)
Globulin: 2.2 g/dL (calc) (ref 1.9–3.7)
Glucose, Bld: 149 mg/dL — ABNORMAL HIGH (ref 65–99)
Potassium: 4.3 mmol/L (ref 3.5–5.3)
Sodium: 140 mmol/L (ref 135–146)
Total Bilirubin: 0.6 mg/dL (ref 0.2–1.2)
Total Protein: 6.3 g/dL (ref 6.1–8.1)

## 2019-11-30 LAB — VITAMIN B12: Vitamin B-12: >2000 pg/mL — ABNORMAL HIGH (ref 200–1100)

## 2019-11-30 LAB — LIPID PANEL
Cholesterol: 225 mg/dL — ABNORMAL HIGH (ref <200)
HDL: 49 mg/dL — ABNORMAL LOW (ref >=50)
LDL Cholesterol (Calc): 137 mg/dL (calc) — ABNORMAL HIGH
Non-HDL Cholesterol (Calc): 176 mg/dL (calc) — ABNORMAL HIGH (ref <130)
Total CHOL/HDL Ratio: 4.6 (calc) (ref <5.0)
Triglycerides: 254 mg/dL — ABNORMAL HIGH (ref <150)

## 2019-11-30 LAB — VITAMIN D 25 HYDROXY (VIT D DEFICIENCY, FRACTURES): Vit D, 25-Hydroxy: 32 ng/mL (ref 30–100)

## 2019-11-30 NOTE — Progress Notes (Addendum)
Subjective:   Yvette [name] is a 74 y.o. female who presents for an Initial Medicare Annual Wellness Visit.  This visit is being conducted via phone call  - after an attmept to do on video chat - due to the COVID-19 pandemic. This patient has given me verbal consent via phone to conduct this visit, patient states they are participating from their home address. Some vital signs may be absent or patient reported.   Patient identification: identified by name, DOB, and current address.    Review of Systems      Cardiac Risk Factors include: advanced age (>57men, >56 women);dyslipidemia;hypertension     Objective:    Today's Vitals   11/30/19 1457 11/30/19 1458  BP: (!) 148/74   Pulse: 65   Weight: 248 lb 6.4 oz (112.7 kg)   Height: 5\' 6"  (1.676 m)   PainSc:  8    Body mass index is 40.09 kg/m.  Advanced Directives 11/30/2019 06/06/2015 06/06/2015  Does Patient Have a Medical Advance Directive? Yes Yes No;Yes  Type of Advance Directive Living will;Healthcare Power of Attorney Living will Living will  Does patient want to make changes to medical advance directive? - No - Patient declined -  Copy of [place] in Chart? No - copy requested No - copy requested No - copy requested    Current Medications (verified) Outpatient Encounter Medications as of 11/30/2019  Medication Sig  . Acetaminophen (TYLENOL 8 HOUR ARTHRITIS PAIN PO) Take by mouth. 2 in am 1 in pm  . albuterol (VENTOLIN HFA) 108 (90 Base) MCG/ACT inhaler Inhale 2 puffs into the lungs every 4 (four) hours as needed for wheezing or shortness of breath (cough).  Marland Kitchen amLODipine (NORVASC) 10 MG tablet Take 1 tablet (10 mg total) by mouth daily.  Marland Kitchen aspirin 81 MG tablet Take 81 mg by mouth daily.  . beta carotene w/minerals (OCUVITE) tablet Take 1 tablet by mouth daily.  . Biotin 5000 MCG TABS Take 1 tablet by mouth daily.  . Calcium-Phosphorus-Vitamin D (CITRACAL +D3 PO) Take 1 Dose by mouth 2 (two) times  daily.  . Cetirizine HCl 10 MG CAPS Take by mouth.  Marland Kitchen Co-Enzyme Q-10 100 MG CAPS Take 1 capsule by mouth daily.  . fluticasone (FLONASE) 50 MCG/ACT nasal spray Place 2 sprays into both nostrils daily.  . hydrochlorothiazide (HYDRODIURIL) 25 MG tablet Take 1 tablet (25 mg total) by mouth daily.  Javier Docker Oil 300 MG CAPS Take by mouth daily.  Marland Kitchen lisinopril (ZESTRIL) 40 MG tablet Take 1 tablet (40 mg total) by mouth daily.  . Mag Aspart-Potassium Aspart (POTASSIUM & MAGNESIUM ASPARTAT PO) Take 1 tablet by mouth daily.  . metFORMIN (GLUCOPHAGE) 500 MG tablet Take 1 tablet (500 mg total) by mouth 2 (two) times daily with a meal.  . Multiple Vitamin (MULTIVITAMIN) tablet Take 1 tablet by mouth daily.  . pantoprazole (PROTONIX) 40 MG tablet Take 1 tablet (40 mg total) by mouth daily before breakfast.  . [DISCONTINUED] acetaminophen (TYLENOL) 500 MG tablet Take 500 mg by mouth 2 (two) times daily as needed.   . [DISCONTINUED] Cholecalciferol (VITAMIN D3) 50 MCG (2000 UT) capsule Take 1 capsule (2,000 Units total) by mouth daily. (Patient not taking: Reported on 11/30/2019)  . [DISCONTINUED] ketoconazole (NIZORAL) 2 % cream   . [DISCONTINUED] loratadine (CLARITIN) 10 MG tablet Take 10 mg by mouth daily.  . [DISCONTINUED] raloxifene (EVISTA) 60 MG tablet Take 1 tablet (60 mg total) by mouth daily. (Patient not  taking: Reported on 08/30/2019)  . [DISCONTINUED] rosuvastatin (CRESTOR) 5 MG tablet Take 1 tablet (5 mg total) by mouth once a week. (Patient not taking: Reported on 10/11/2019)   No facility-administered encounter medications on file as of 11/30/2019.    Allergies (verified) Simvastatin and Erythromycin   History: Past Medical History:  Diagnosis Date  . Arthropathy, unspecified, site unspecified   . Esophageal reflux   . Osteoporosis, unspecified   . Other abnormal glucose   . Other and unspecified hyperlipidemia   . Symptomatic menopausal or female climacteric states   . Syncope and  collapse    Past Surgical History:  Procedure Laterality Date  . ABDOMINAL HYSTERECTOMY    . APPENDECTOMY    . BREAST BIOPSY Left 1970's   neg  . COLONOSCOPY    . FINGER FRACTURE SURGERY     Family History  Problem Relation Age of Onset  . Diabetes Mother   . Heart disease Mother   . Heart attack Mother   . Ulcers Father   . Gallstones Father   . Heart attack Brother    Social History   Socioeconomic History  . Marital status: Widowed    Spouse name: Not on file  . Number of children: Not on file  . Years of education: Not on file  . Highest education level: Not on file  Occupational History  . Not on file  Tobacco Use  . Smoking status: Former Smoker    Packs/day: 1.00    Years: 26.00    Pack years: 26.00    Types: Cigarettes    Quit date: 06/07/1992    Years since quitting: 27.4  . Smokeless tobacco: Never Used  Substance and Sexual Activity  . Alcohol use: Not Currently    Alcohol/week: 0.0 standard drinks    Comment: occassional  . Drug use: No  . Sexual activity: Not on file  Other Topics Concern  . Not on file  Social History Narrative  . Not on file   Social Determinants of Health   Financial Resource Strain: Low Risk   . Difficulty of Paying Living Expenses: Not very hard  Food Insecurity: No Food Insecurity  . Worried About Charity fundraiser in the Last Year: Never true  . Ran Out of Food in the Last Year: Never true  Transportation Needs: No Transportation Needs  . Lack of Transportation (Medical): No  . Lack of Transportation (Non-Medical): No  Physical Activity: Insufficiently Active  . Days of Exercise per Week: 2 days  . Minutes of Exercise per Session: 30 min  Stress: No Stress Concern Present  . Feeling of Stress : Not at all  Social Connections: Severely Isolated  . Frequency of Communication with Friends and Family: Once a week  . Frequency of Social Gatherings with Friends and Family: Once a week  . Attends Religious Services:  Never  . Active Member of Clubs or Organizations: No  . Attends Archivist Meetings: Never  . Marital Status: Widowed    Tobacco Counseling Counseling given: Not Answered   Clinical Intake:  Pre-visit preparation completed: Yes  Pain : 0-10 Pain Score: 8  Pain Type: Chronic pain Pain Location: Back Pain Descriptors / Indicators: Aching Pain Onset: More than a month ago Pain Frequency: Constant     Nutritional Status: BMI > 30  Obese Nutritional Risks: None Diabetes: No  How often do you need to have someone help you when you read instructions, pamphlets, or other written materials from  your doctor or pharmacy?: 1 - Never  Interpreter Needed?: No  Information entered by :: Khaliya Golinski,LPN   Activities of Daily Living In your present state of health, do you have any difficulty performing the following activities: 11/30/2019  Hearing? N  Comment no hearing aids  Vision? N  Comment Dr.Shade annually, reading glasses  Difficulty concentrating or making decisions? N  Walking or climbing stairs? Y  Comment back pain  Dressing or bathing? N  Doing errands, shopping? N  Preparing Food and eating ? N  Using the Toilet? N  In the past six months, have you accidently leaked urine? N  Do you have problems with loss of bowel control? N  Managing your Medications? N  Managing your Finances? N  Housekeeping or managing your Housekeeping? N  Some recent data might be hidden     Immunizations and Health Maintenance Immunization History  Administered Date(s) Administered  . Fluad Quad(high Dose 65+) 08/30/2019  . Influenza Split 07/07/2013  . Influenza, High Dose Seasonal PF 07/08/2017, 07/13/2018  . Influenza,inj,Quad PF,6+ Mos 06/07/2015, 08/15/2016  . Pneumococcal Conjugate-13 12/21/2014  . Pneumococcal Polysaccharide-23 07/08/2017  . Zoster Recombinat (Shingrix) 02/13/2018, 04/16/2018   Health Maintenance Due  Topic Date Due  . OPHTHALMOLOGY EXAM   06/15/2015    Patient Care Team: Olin Hauser, DO as PCP - General (Family Medicine) [place], Virl Diamond, [place] as Pharmacist Greg Cutter, LCSW as Social Worker (Licensed Clinical Social Worker) Minor, Dalbert Garnet, RN (Inactive) as [place] any recent [place] you may have received from other than [facility] providers in the past year (date may be approximate).     Assessment:   This is a routine wellness examination for Shealin.  Hearing/Vision screen  Hearing Screening   125Hz  250Hz  500Hz  1000Hz  2000Hz  3000Hz  4000Hz  6000Hz  8000Hz   Right ear:           Left ear:           Vision Screening Comments: Dr.Shade   Dietary issues and exercise activities discussed: Current Exercise Habits: Home exercise routine, Type of exercise: walking, Time (Minutes): 30, Frequency (Times/Week): 3, Weekly Exercise (Minutes/Week): 90, Intensity: Mild, Exercise limited by: None identified  Goals    . SW- I need additional support and resource connection. I have little support since my spouse passed 4 years ago" (pt-stated)     Current Barriers:  . Acute Mental Health needs related to Anxiety and ongoing grief symptoms  . Financial constraints related to managing health care expenses and affording bills . Limited social support . Limited access to food . Level of care concerns . ADL IADL limitations . Limited access to caregiver . Inability to perform ADL's independently . Inability to perform IADL's independently . Lacks knowledge of community resource: available food support resources  Clinical Social Work Goal(s):  Marland Kitchen Over the next 120 days, patient will work with SW bi-monthly by telephone or in person to reduce or manage symptoms related to anxiety . Over the next 120 days, client will work with SW to address concerns related to gaining additional support/resources in order to improve self-care, physical health, emotional health and overall  well-being.   Interventions: . Patient interviewed and appropriate assessments performed: brief mental health assessment . Patient interviewed and appropriate assessments performed . Provided patient with information about available financial and food support resources within the area. Patient is agreeable to M[facility] referral through [facility] contract. LCSW completed referral and asked for meals to  be delivered for 60 days while she is on the wait list for services. UPDATE- Patient is no longer receiving meals but is on wait list. Patient reports that she is actually cooking again and does not need meal prep assistance at this time. Patient reports that she will be cooking ahi tuna steak tonight for herself.   . Discussed plans with patient for ongoing care management follow up and provided patient with direct contact information for care management team . Assisted patient/caregiver with obtaining information about health plan benefits . Provided education and assistance to client regarding Advanced Directives. . Provided education to patient/caregiver regarding level of care options. . 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  Marland Kitchen Psychoeducation and/or Health Education provided  Patient Self Care Activities:  . Attends all scheduled provider appointments . Calls provider office for new concerns or questions  . Unable to independently prepare meals for self  Please see past updates related to this goal by clicking on the "Past Updates" button in the selected goal        Depression Screen PHQ 2/9 Scores 11/30/2019 08/30/2019 05/28/2019 02/24/2019 10/15/2018 07/13/2018 04/01/2018  PHQ - 2 Score 0 0 0 0 0 0 0  PHQ- 9 Score - 0 - - - - -    Fall Risk Fall Risk  11/30/2019 05/28/2019 02/24/2019 10/15/2018 07/13/2018  Falls in the past year? 0 0 0 0 No  Number falls in past yr: 0 - - - -  Injury with Fall? 0 -  - - -  Follow up - Falls evaluation completed Falls evaluation completed Falls evaluation completed -   FALL RISK PREVENTION PERTAINING TO THE HOME:  Any stairs in or around the home? No  If so, are there any without handrails? No   Home free of loose throw rugs in walkways, pet beds, electrical cords, etc? Yes  Adequate lighting in your home to reduce risk of falls? Yes   ASSISTIVE DEVICES UTILIZED TO PREVENT FALLS:  Life alert? No  Use of a cane, walker or w/c? Yes  cane as needed Grab bars in the bathroom? No  Shower chair or bench in shower? No  Elevated toilet seat or a handicapped toilet? No    DME ORDERS:  DME order needed?  No   TIMED UP AND GO:  Unable to perform     Cognitive Function:        Screening Tests Health Maintenance  Topic Date Due  . OPHTHALMOLOGY EXAM  06/15/2015  . Fecal DNA (Cologuard)  12/12/2019  . FOOT EXAM  05/27/2020  . HEMOGLOBIN A1C  05/28/2020  . TETANUS/TDAP  03/08/2023  . INFLUENZA VACCINE  Completed  . DEXA SCAN  Completed  . Hepatitis C Screening  Completed  . PNA vac Low Risk Adult  Completed    Qualifies for Shingles Vaccine? Shingrix completed   Tdap: up to date   Flu Vaccine: up to date   Pneumococcal Vaccine: up to date .   Covid-19: information provided.   Cancer Screenings:  Colorectal Screening: Completed cologuard 2018. Repeat every 3 years. ordered  Mammogram: Completed 2019, ordered    Bone Density: Completed 2014.   Lung Cancer Screening: (Low Dose CT Chest recommended if Age 45-80 years, 30 pack-year currently smoking OR have quit w/in 15years.) does not qualify.    Additional Screening:  Hepatitis C Screening: does qualify; Completed 2014  Vision Screening: Recommended annual ophthalmology  exams for early detection of glaucoma and other disorders of the eye. Is the patient up to date with their annual eye exam?  Yes  Who is the provider or what is the name of the office in which the pt  attends annual eye exams? Dr.Shade - had diabetic eye exam in Jan 2021   Dental Screening: Recommended annual dental exams for proper oral hygiene  Community Resource Referral:  CRR required this visit?  No       Plan:  I have personally reviewed and addressed the Medicare Annual Wellness questionnaire and have noted the following in the patient's chart:  A. Medical and social history B. Use of alcohol, tobacco or illicit drugs  C. Current medications and supplements D. Functional ability and status E.  Nutritional status F.  Physical activity G. Advance directives H. List of other physicians I.  Hospitalizations, surgeries, and ER visits in previous 12 months J.  [place] such as hearing and vision if needed, cognitive and depression L. Referrals and appointments   In addition, I have reviewed and discussed with patient certain preventive protocols, quality metrics, and best practice recommendations. A written personalized care plan for preventive services as well as general preventive health recommendations were provided to patient.   Signed,    Bevelyn Ngo, LPN   [id number]  Nurse Health Advisor   Nurse Notes: Patient injured left leg back in December. States it teared the skin, healing took about a month and in the last 2 weeks it has worsened.  It is red, swollen and tender and warm to the touch. Swelling is radiating down into foot. No issues with walking, is afebrile. She is not scheduled to come in until 12/02/2019 for CPE. Will advise PCP. Discussed with Dr.Karamalegos who advises patient go to urgent care or schedule an appt for tomorrow in office. Called patient back and scheduled office visit for tomorrow 2/24/201 with Dr.Karamaelgos.

## 2019-11-30 NOTE — Patient Instructions (Signed)
Yvette [name] , Thank you for taking time to come for your Medicare Wellness Visit. I appreciate your ongoing commitment to your health goals. Please review the following plan we discussed and let me know if I can assist you in the future.   Screening recommendations/referrals: Colonoscopy: cologuard ordered  Recommended yearly ophthalmology/optometry visit for glaucoma screening and checkup Recommended yearly dental visit for hygiene and checkup  Vaccinations: Influenza vaccine: up to date  Pneumococcal vaccine: up to date  Tdap vaccine: up to date  Shingles vaccine: up to date     Advanced directives: Please bring a copy of your health care power of attorney and living will to the office at your convenience.  Conditions/risks identified: diabetic, in contact with chronic care management already.   Next appointment: Follow up in one year for your annual wellness visit.   Preventive Care 76 Years and Older, Female Preventive care refers to lifestyle choices and visits with your health care provider that can promote health and wellness. What does preventive care include?  A yearly physical exam. This is also called an annual well check.  Dental exams once or twice a year.  Routine eye exams. Ask your health care provider how often you should have your eyes checked.  Personal lifestyle choices, including:  Daily care of your teeth and gums.  Regular physical activity.  Eating a healthy diet.  Avoiding tobacco and drug use.  Limiting alcohol use.  Practicing safe sex.  Taking low doses of aspirin every day.  Taking vitamin and mineral supplements as recommended by your health care provider. What happens during an annual well check? The services and screenings done by your health care provider during your annual well check will depend on your age, overall health, lifestyle risk factors, and family history of disease. Counseling  Your health care provider may ask you questions  about your:  Alcohol use.  Tobacco use.  Drug use.  Emotional well-being.  Home and relationship well-being.  Sexual activity.  Eating habits.  History of falls.  Memory and ability to understand (cognition).  Work and work Statistician. Screening  You may have the following tests or measurements:  Height, weight, and BMI.  Blood pressure.  Lipid and cholesterol levels. These may be checked every 5 years, or more frequently if you are over 80 years old.  Skin check.  Lung cancer screening. You may have this screening every year starting at age 76 if you have a 30-pack-year history of smoking and currently smoke or have quit within the past 15 years.  Fecal occult blood test (FOBT) of the stool. You may have this test every year starting at age 76.  Flexible sigmoidoscopy or colonoscopy. You may have a sigmoidoscopy every 5 years or a colonoscopy every 10 years starting at age 82.  Prostate cancer screening. Recommendations will vary depending on your family history and other risks.  Hepatitis C blood test.  Hepatitis B blood test.  Sexually transmitted disease (STD) testing.  Diabetes screening. This is done by checking your blood sugar (glucose) after you have not eaten for a while (fasting). You may have this done every 1-3 years.  Abdominal aortic aneurysm (AAA) screening. You may need this if you are a current or former smoker.  Osteoporosis. You may be screened starting at age 20 if you are at high risk. Talk with your health care provider about your test results, treatment options, and if necessary, the need for more tests. Vaccines  Your health care  provider may recommend certain vaccines, such as:  Influenza vaccine. This is recommended every year.  Tetanus, diphtheria, and acellular pertussis (Tdap, Td) vaccine. You may need a Td booster every 10 years.  Zoster vaccine. You may need this after age 55.  Pneumococcal 13-valent conjugate (PCV13)  vaccine. One dose is recommended after age 29.  Pneumococcal polysaccharide (PPSV23) vaccine. One dose is recommended after age 88. Talk to your health care provider about which screenings and vaccines you need and how often you need them. This information is not intended to replace advice given to you by your health care provider. Make sure you discuss any questions you have with your health care provider. Document Released: 10/20/2015 Document Revised: 06/12/2016 Document Reviewed: 07/25/2015 Elsevier Interactive Patient Education  2017 [place] Prevention in the Home Falls can cause injuries. They can happen to people of all ages. There are many things you can do to make your home safe and to help prevent falls. What can I do on the outside of my home?  Regularly fix the edges of walkways and driveways and fix any cracks.  Remove anything that might make you trip as you walk through a door, such as a raised step or threshold.  Trim any bushes or trees on the path to your home.  Use bright outdoor lighting.  Clear any walking paths of anything that might make someone trip, such as rocks or tools.  Regularly check to see if handrails are loose or broken. Make sure that both sides of any steps have handrails.  Any raised decks and porches should have guardrails on the edges.  Have any leaves, snow, or ice cleared regularly.  Use sand or salt on walking paths during winter.  Clean up any spills in your garage right away. This includes oil or grease spills. What can I do in the bathroom?  Use night lights.  Install grab bars by the toilet and in the tub and shower. Do not use towel bars as grab bars.  Use non-skid mats or decals in the tub or shower.  If you need to sit down in the shower, use a plastic, non-slip stool.  Keep the floor dry. Clean up any water that spills on the floor as soon as it happens.  Remove soap buildup in the tub or shower  regularly.  Attach bath mats securely with double-sided non-slip rug tape.  Do not have throw rugs and other things on the floor that can make you trip. What can I do in the bedroom?  Use night lights.  Make sure that you have a light by your bed that is easy to reach.  Do not use any sheets or blankets that are too big for your bed. They should not hang down onto the floor.  Have a firm chair that has side arms. You can use this for support while you get dressed.  Do not have throw rugs and other things on the floor that can make you trip. What can I do in the kitchen?  Clean up any spills right away.  Avoid walking on wet floors.  Keep items that you use a lot in easy-to-reach places.  If you need to reach something above you, use a strong step stool that has a grab bar.  Keep electrical cords out of the way.  Do not use floor polish or wax that makes floors slippery. If you must use wax, use non-skid floor wax.  Do not have throw  rugs and other things on the floor that can make you trip. What can I do with my stairs?  Do not leave any items on the stairs.  Make sure that there are handrails on both sides of the stairs and use them. Fix handrails that are broken or loose. Make sure that handrails are as long as the stairways.  Check any carpeting to make sure that it is firmly attached to the stairs. Fix any carpet that is loose or worn.  Avoid having throw rugs at the top or bottom of the stairs. If you do have throw rugs, attach them to the floor with carpet tape.  Make sure that you have a light switch at the top of the stairs and the bottom of the stairs. If you do not have them, ask someone to add them for you. What else can I do to help prevent falls?  Wear shoes that:  Do not have high heels.  Have rubber bottoms.  Are comfortable and fit you well.  Are closed at the toe. Do not wear sandals.  If you use a stepladder:  Make sure that it is fully  opened. Do not climb a closed stepladder.  Make sure that both sides of the stepladder are locked into place.  Ask someone to hold it for you, if possible.  Clearly mark and make sure that you can see:  Any grab bars or handrails.  First and last steps.  Where the edge of each step is.  Use tools that help you move around (mobility aids) if they are needed. These include:  Canes.  Walkers.  Scooters.  Crutches.  Turn on the lights when you go into a dark area. Replace any light bulbs as soon as they burn out.  Set up your furniture so you have a clear path. Avoid moving your furniture around.  If any of your floors are uneven, fix them.  If there are any pets around you, be aware of where they are.  Review your medicines with your doctor. Some medicines can make you feel dizzy. This can increase your chance of falling. Ask your doctor what other things that you can do to help prevent falls. This information is not intended to replace advice given to you by your health care provider. Make sure you discuss any questions you have with your health care provider. Document Released: 07/20/2009 Document Revised: 02/29/2016 Document Reviewed: 10/28/2014 Elsevier Interactive Patient Education  2017 [employer].

## 2019-12-01 ENCOUNTER — Ambulatory Visit (INDEPENDENT_AMBULATORY_CARE_PROVIDER_SITE_OTHER): Payer: Medicare HMO | Admitting: Family Medicine

## 2019-12-01 ENCOUNTER — Other Ambulatory Visit: Payer: Self-pay

## 2019-12-01 ENCOUNTER — Encounter: Payer: Self-pay | Admitting: Family Medicine

## 2019-12-01 VITALS — BP 135/66 | HR 73 | Temp 97.8°F | Resp 16 | Ht 66.0 in | Wt 235.0 lb

## 2019-12-01 DIAGNOSIS — I872 Venous insufficiency (chronic) (peripheral): Secondary | ICD-10-CM

## 2019-12-01 MED ORDER — FUROSEMIDE 20 MG PO TABS: 20.0000 mg | ORAL_TABLET | Freq: Every day | ORAL | 2 refills | Status: DC | PRN

## 2019-12-01 MED ORDER — MUPIROCIN CALCIUM 2 % EX CREA: 1.0000 "application " | TOPICAL_CREAM | Freq: Two times a day (BID) | CUTANEOUS | 1 refills | Status: DC | PRN

## 2019-12-01 NOTE — Progress Notes (Signed)
Subjective:    Patient ID: Yvette [name], female    DOB: [birth year], 74 y.o.   MRN: [id number]  Yvette [name] is a 74 y.o. female presenting on 12/01/2019 for Foot Swelling (injured at the end of 09/2019, by end of 10/2019 bruces were gone but now from 11/2019 she feels pain and swelling internal and swelling noticed as day progressed and by elevation at night swelling gets improved )   HPI   Left Lower Extremity / Edema / Venous Stasis Dermatitis History of Left lower extremity laceration / contusion Reports known injury back in 09/2019 accidental hit shin / leg on chair and laceration, has since healed. No further break in skin or bleeding She described past week has had worse left lower extremity redness, swelling, and warmth and tenderness. Seems to have episodic swelling in foot and lower extremity - Known chronic history of swelling in LE. She is former smoker, quit >30 years, had some long history of 2nd hand smoke, never dx with COPD - History of weight gain No personal history of DVT / PE - Fam history of granddaughter with blood clot in leg, they did not diagnose a hereditary condition for increased, they think it was potentially provoked by long car trip - Prior vascular ultrasound in 10/2017 negative  Denies numbness tingling in lower extremity    Depression screen [facility] 2/9 11/30/2019 08/30/2019 05/28/2019  Decreased Interest 0 0 0  Down, Depressed, Hopeless 0 0 0  PHQ - 2 Score 0 0 0  Altered sleeping - 0 -  Tired, decreased energy - 0 -  Change in appetite - 0 -  Feeling bad or failure about yourself  - 0 -  Trouble concentrating - 0 -  Moving slowly or fidgety/restless - 0 -  Suicidal thoughts - 0 -  PHQ-9 Score - 0 -  Difficult doing work/chores - - -    Social History   Tobacco Use  . Smoking status: Former Smoker    Packs/day: 1.00    Years: 26.00    Pack years: 26.00    Types: Cigarettes    Quit date: 06/07/1992    Years since quitting: 27.5  .  Smokeless tobacco: Never Used  Substance Use Topics  . Alcohol use: Not Currently    Alcohol/week: 0.0 standard drinks    Comment: occassional  . Drug use: No    Review of Systems Per HPI unless specifically indicated above     Objective:    BP 135/66   Pulse 73   Temp 97.8 F (36.6 C) (Oral)   Resp 16   Ht 5\' 6"  (1.676 m)   Wt 235 lb (106.6 kg)   BMI 37.93 kg/m   Wt Readings from Last 3 Encounters:  12/01/19 235 lb (106.6 kg)  11/30/19 248 lb 6.4 oz (112.7 kg)  08/30/19 248 lb 6.4 oz (112.7 kg)    Physical Exam Vitals and nursing note reviewed.  Constitutional:      General: She is not in acute distress.    Appearance: She is well-developed. She is not diaphoretic.     Comments: Well-appearing, comfortable, cooperative  HENT:     Head: Normocephalic and atraumatic.  Eyes:     General:        Right eye: No discharge.        Left eye: No discharge.     Conjunctiva/sclera: Conjunctivae normal.  Cardiovascular:     Rate and Rhythm: Normal rate.     Pulses:  Normal pulses.     Heart sounds: No murmur.  Pulmonary:     Effort: Pulmonary effort is normal.     Breath sounds: Normal breath sounds. No wheezing or rales.  Musculoskeletal:     Right lower leg: Edema present.     Left lower leg: Edema (slightly increased compared to Right. L side has some slight discoloration but not erythema. no laceration or ulcer) present.  Skin:    General: Skin is warm and dry.     Findings: No erythema or rash.  Neurological:     Mental Status: She is alert and oriented to person, place, and time.  Psychiatric:        Behavior: Behavior normal.     Comments: Well groomed, good eye contact, normal speech and thoughts    Results for orders placed or performed in visit on 11/25/19  [facility] - Vitamin B12 level  Result Value Ref Range   Vitamin B-12 >2,000 (H) 200 - 1,100 pg/mL  [facility] - VITAMIN D 25 Hydroxy (Vit-D Deficiency, Fractures)  Result Value Ref Range   Vit D, 25-Hydroxy 32  30 - 100 ng/mL  [facility] - Lipid panel physical  Result Value Ref Range   Cholesterol 225 (H) <200 mg/dL   HDL 49 (L) > OR = 50 mg/dL   Triglycerides 254 (H) <150 mg/dL   LDL Cholesterol (Calc) 137 (H) mg/dL (calc)   Total CHOL/HDL Ratio 4.6 <5.0 (calc)   Non-HDL Cholesterol (Calc) 176 (H) <130 mg/dL (calc)  [facility] - CMET w/ GFR CMP Complete Metabolic Panel physical  Result Value Ref Range   Glucose, Bld 149 (H) 65 - 99 mg/dL   BUN 9 7 - 25 mg/dL   Creat 0.63 0.60 - 0.93 mg/dL   GFR, Est Non African American 88 > OR = 60 mL/min/1.75m2   GFR, Est African American 102 > OR = 60 mL/min/1.27m2   BUN/Creatinine Ratio NOT APPLICABLE 6 - 22 (calc)   Sodium 140 135 - 146 mmol/L   Potassium 4.3 3.5 - 5.3 mmol/L   Chloride 104 98 - 110 mmol/L   CO2 28 20 - 32 mmol/L   Calcium 9.3 8.6 - 10.4 mg/dL   Total Protein 6.3 6.1 - 8.1 g/dL   Albumin 4.1 3.6 - 5.1 g/dL   Globulin 2.2 1.9 - 3.7 g/dL (calc)   AG Ratio 1.9 1.0 - 2.5 (calc)   Total Bilirubin 0.6 0.2 - 1.2 mg/dL   Alkaline phosphatase (APISO) 51 37 - 153 U/L   AST 63 (H) 10 - 35 U/L   ALT 53 (H) 6 - 29 U/L  [facility] - CBC with Differential/Platelet physical  Result Value Ref Range   WBC 3.9 3.8 - 10.8 Thousand/uL   RBC 4.25 3.80 - 5.10 Million/uL   Hemoglobin 12.9 11.7 - 15.5 g/dL   HCT 38.6 35.0 - 45.0 %   MCV 90.8 80.0 - 100.0 fL   MCH 30.4 27.0 - 33.0 pg   MCHC 33.4 32.0 - 36.0 g/dL   RDW 13.5 11.0 - 15.0 %   Platelets 166 140 - 400 Thousand/uL   MPV 11.5 7.5 - 12.5 fL   Neutro Abs 2,270 1,500 - 7,800 cells/uL   Lymphs Abs 1,115 850 - 3,900 cells/uL   Absolute Monocytes 402 200 - 950 cells/uL   Eosinophils Absolute 82 15 - 500 cells/uL   Basophils Absolute 31 0 - 200 cells/uL   Neutrophils Relative % 58.2 %   Total Lymphocyte 28.6 %   Monocytes  Relative 10.3 %   Eosinophils Relative 2.1 %   Basophils Relative 0.8 %  [facility] - A1c LAB Hemoglobin A1C physical  Result Value Ref Range   Hgb A1c MFr Bld 6.5 (H) <5.7 % of total Hgb    Mean Plasma Glucose 140 (calc)   eAG (mmol/L) 7.7 (calc)    *[place], [place] [address]               [phone]   -------------------------------------------------------------------  Transthoracic Echocardiography   Patient:  Nakesia, Mcnees  MR #:    [id number]  Study Date: 04/10/2018  Gender:   F  Age:    78  Height:   170.2 cm  Weight:   109.8 kg  BSA:    2.33 m^2  Pt. Status:  Room:   ATTENDING  Larina Earthly  REFERRING  Lilleigh Hechavarria, Amelia, [facility]  SONOGRAPHER Charmayne Sheer, RDCS   cc:   -------------------------------------------------------------------  LV EF: 60% -  65%   -------------------------------------------------------------------  Indications:   Dyspnea on exertion R06.09, Bilateral lower  extremity edema I10.   -------------------------------------------------------------------  History:  PMH:  Syncope. Risk factors: Hypertension.   -------------------------------------------------------------------  Study Conclusions   - Left ventricle: The cavity size was normal. Systolic function was  normal. The estimated ejection fraction was in the range of 60%  to 65%. Wall motion was normal; there were no regional wall  motion abnormalities. Doppler parameters are consistent with  abnormal left ventricular relaxation (grade 1 diastolic  dysfunction).  - Left atrium: The atrium was mildly dilated.  - Right ventricle: Systolic function was normal.  - Pulmonary arteries: Systolic pressure could not be accurately  estimated.   -------------------------------------------------------------------  Study data:  Study status: Routine. Procedure: The patient  reported no pain pre or post test. Transthoracic echocardiography.    Image quality was fair.     Transthoracic echocardiography.  M-mode, complete 2D, spectral Doppler, and color Doppler.  Birthdate: Patient birthdate: 1946/06/19. Age: Patient is 74 yr  old. Sex: Gender: female.  BMI: 37.9 kg/m^2. Blood pressure:  158/73 Patient status: Outpatient. Study date: Study date:  04/10/2018. Study time: 10:53 AM. Location: Echo laboratory.   -------------------------------------------------------------------   -------------------------------------------------------------------  Left ventricle: The cavity size was normal. Systolic function was  normal. The estimated ejection fraction was in the range of 60% to  65%. Wall motion was normal; there were no regional wall motion  abnormalities. Doppler parameters are consistent with abnormal left  ventricular relaxation (grade 1 diastolic dysfunction).   -------------------------------------------------------------------  Aortic valve:  Trileaflet; normal thickness leaflets. Mobility was  not restricted. Doppler: Transvalvular velocity was within the  normal range. There was no stenosis. There was no regurgitation.  VTI ratio of LVOT to aortic valve: 0.9. Valve area (VTI): 3.42  cm^2. Indexed valve area (VTI): 1.47 cm^2/m^2. Peak velocity ratio  of LVOT to aortic valve: 0.97. Valve area (Vmax): 3.68 cm^2.  Indexed valve area (Vmax): 1.58 cm^2/m^2. Mean velocity ratio of  LVOT to aortic valve: 0.92. Valve area (Vmean): 3.51 cm^2. Indexed  valve area (Vmean): 1.51 cm^2/m^2.  Mean gradient (S): 3 mm Hg.  Peak gradient (S): 7 mm Hg.   -------------------------------------------------------------------  Aorta: Aortic root: The aortic root was normal in size.   -------------------------------------------------------------------  Mitral valve:  Structurally normal valve.  Mobility was not  restricted. Doppler: Transvalvular velocity was within the normal  range. There was no evidence  for stenosis. There was no  regurgitation.  Valve area by pressure half-time: 2.65 cm^2.  Indexed valve area by pressure half-time: 1.14 cm^2/m^2. Valve area  by continuity equation (using LVOT flow): 2.88 cm^2. Indexed valve  area by continuity equation (using LVOT flow): 1.24 cm^2/m^2.  Mean gradient (D): 2 mm Hg.   -------------------------------------------------------------------  Left atrium: The atrium was mildly dilated.   -------------------------------------------------------------------  Right ventricle: The cavity size was normal. Wall thickness was  normal. Systolic function was normal.   -------------------------------------------------------------------  Pulmonic valve:  Doppler: Transvalvular velocity was within the  normal range. There was no evidence for stenosis.   -------------------------------------------------------------------  Tricuspid valve:  Structurally normal valve.  Doppler:  Transvalvular velocity was within the normal range. There was no  regurgitation.   -------------------------------------------------------------------  Pulmonary artery:  The main pulmonary artery was normal-sized.  Systolic pressure could not be accurately estimated.   -------------------------------------------------------------------  Right atrium: The atrium was normal in size.   -------------------------------------------------------------------  Pericardium: There was no pericardial effusion.   -------------------------------------------------------------------  Systemic veins:  Inferior vena cava: The vessel was normal in size.   -------------------------------------------------------------------  Measurements   Left ventricle              Value     Reference  LV ID, ED, PLAX chordal         49.3 mm    43 - 52  LV ID, ES, PLAX chordal         28.2 mm    23 - 38  LV fx shortening, PLAX chordal      43   %    >=29  LV PW thickness, ED           10.7 mm    ----------  IVS/LV PW ratio, ED           0.85      <=1.3  Stroke volume, 2D            83  ml    ----------  Stroke volume/bsa, 2D          36  ml/m^2  ----------  LV e&', lateral              5.77 cm/s   ----------  LV E/e&', lateral             11.77     ----------  LV e&', medial              7.72 cm/s   ----------  LV E/e&', medial             8.8      ----------  LV e&', average              6.75 cm/s   ----------  LV E/e&', average             10.07     ----------    Ventricular septum            Value     Reference  IVS thickness, ED            9.11 mm    ----------    LVOT                   Value     Reference  LVOT ID, S  22  mm    ----------  LVOT area                3.8  cm^2   ----------  LVOT peak velocity, S          124  cm/s   ----------  LVOT mean velocity, S          79.5 cm/s   ----------  LVOT VTI, S               21.9 cm    ----------  LVOT peak gradient, S          6   mm Hg  ----------    Aortic valve               Value     Reference  Aortic valve peak velocity, S      128  cm/s   ----------  Aortic valve mean velocity, S      86  cm/s   ----------  Aortic valve VTI, S           24.3 cm    ----------  Aortic mean gradient, S         3   mm Hg  ----------  Aortic peak gradient, S         7   mm Hg  ----------  VTI ratio, LVOT/AV            0.9      ----------  Aortic valve area, VTI          3.42 cm^2   ----------  Aortic valve area/bsa, VTI         1.47 cm^2/m^2 ----------  Velocity ratio, peak, LVOT/AV      0.97      ----------  Aortic valve area, peak velocity     3.68 cm^2   ----------  Aortic valve area/bsa, peak       1.58 cm^2/m^2 ----------  velocity  Velocity ratio, mean, LVOT/AV      0.92      ----------  Aortic valve area, mean velocity     3.51 cm^2   ----------  Aortic valve area/bsa, mean       1.51 cm^2/m^2 ----------  velocity    Aorta                  Value     Reference  Aortic root ID, ED            34  mm    ----------    Left atrium               Value     Reference  LA ID, A-P, ES              40  mm    ----------  LA ID/bsa, A-P              1.72 cm/m^2  <=2.2  LA volume, ES, 1-p A4C          62.2 ml    ----------  LA volume/bsa, ES, 1-p A4C        26.7 ml/m^2  ----------    Mitral valve               Value     Reference  Mitral E-wave peak velocity       67.9 cm/s   ----------  Mitral A-wave peak velocity       78.6  cm/s   ----------  Mitral mean velocity, D         69.4 cm/s   ----------  Mitral deceleration time         227  ms    150 - 230  Mitral pressure half-time        83  ms    ----------  Mitral mean gradient, D         2   mm Hg  ----------  Mitral E/A ratio, peak          0.9      ----------  Mitral valve area, PHT, DP        2.65 cm^2   ----------  Mitral valve area/bsa, PHT, DP      1.14 cm^2/m^2 ----------  Mitral valve area, LVOT         2.88 cm^2   ----------  continuity  Mitral valve area/bsa, LVOT       1.24 cm^2/m^2 ----------  continuity  Mitral annulus VTI, D          28.9 cm    ----------    Right atrium                Value     Reference  RA ID, S-I, ES, A4C       (H)   52.7 mm    34 - 49  RA area, ES, A4C             18.3 cm^2   8.3 - 19.5  RA volume, ES, A/L            51.5 ml    ----------  RA volume/bsa, ES, A/L          22.1 ml/m^2  ----------    Right ventricle             Value     Reference  RV ID, ED, PLAX         (H)   38.6 mm    19 - 38  RV s&', lateral, S            15.8 cm/s   ----------    Pulmonic valve              Value     Reference  Pulmonic valve peak velocity, S     96.6 cm/s   ----------  Pulmonic acceleration time        137  ms    ----------   Legend:  (L) and (H) mark values outside specified reference range.   -------------------------------------------------------------------  Prepared and Electronically Authenticated by   Esmond Plants, MD, [facility]  2019-07-05T12:07:35    Assessment & Plan:   Problem List Items Addressed This Visit    Venous insufficiency of both lower extremities - Primary   Relevant Medications   furosemide (LASIX) 20 MG tablet    Other Visit Diagnoses    Venous stasis dermatitis of left lower extremity       Relevant Medications   mupirocin cream (BACTROBAN) 2 %      Clinically LE L>R Venous insufficiency Reassurance, no evidence of acute secondary infection Has stasis dermatitis No ulceration Reviewed reduced healing w/ edema Trial RICE therapy Add rx mupirocin BID for 1-2 weeks PRN over area of redness and any skin break for prevention START Lasix 20mg  1-2 per day PRN edema, use intermittent only  Future refer to vascular if indicated, would anticipate needs updated Venous dopplers   Meds  ordered this encounter  Medications  . mupirocin cream (BACTROBAN) 2 %    Sig: Apply 1 application topically 2 (two) times daily as needed. To affected area as directed for up to 7-14  days as needed    Dispense:  30 g    Refill:  1  . furosemide (LASIX) 20 MG tablet    Sig: Take 1-2 tablets (20-40 mg total) by mouth daily as needed for fluid or edema. Recommend use up to 3-5 days at a time if needed.    Dispense:  30 tablet    Refill:  2      Follow up plan: Return if symptoms worsen or fail to improve.   Nobie Putnam, [facility] 12/01/2019, 10:21 AM

## 2019-12-01 NOTE — Patient Instructions (Addendum)
Thank you for coming to the office today.  Swelling appears to be due to venous insufficiency, weaker veins difficulty pumping fluid back up from legs. Causes redness and skin changes or "stasis dermatitis"  Try topical antibiotic cream on this area of redness twice a day for 1-2 weeks, anytime you have redness and warmth of leg or if you have break in skin or abrasion. It can prevent infection or treat mild skin infection  I don't think it is from your heart.   If we need we can refer to Dr Lucky Cowboy at [facility] in the future.  Use RICE therapy: - R - Rest / relative rest with activity modification avoid overuse of joint - I - Ice packs (make sure you use a towel or sock / something to protect skin) - C - Compression with ACE wrap to apply pressure and reduce swelling allowing more support - E - Elevation - if significant swelling, lift leg above heart level (toes above your nose) to help reduce swelling, most helpful at night after day of being on your feet  Add Furosemide (lasix) 20mg  take 1-2 pills daily as needed short course only few days to 3-5 days max preferred, it can affect kidney, should increase urine and reduce swelling.  If not improving you may need to return for re-evaluation. But if more severe worsening such as spreading redness or streaking redness, significantly larger size, persistent drainage of pus, increased pain, fevers/chills, nausea vomiting notify us sooner or go to hospital if need   Please schedule a Follow-up Appointment to: Return if symptoms worsen or fail to improve.  If you have any other questions or concerns, please feel free to call the office or send a message through [place]. You may also schedule an earlier appointment if necessary.  Additionally, you may be receiving a survey about your experience at our office within a few days to 1 week by e-mail or mail. We value your feedback.  Nobie Putnam, DO [place]

## 2019-12-02 ENCOUNTER — Encounter: Payer: Self-pay | Admitting: Family Medicine

## 2019-12-02 ENCOUNTER — Ambulatory Visit (INDEPENDENT_AMBULATORY_CARE_PROVIDER_SITE_OTHER): Payer: Medicare HMO | Admitting: Family Medicine

## 2019-12-02 VITALS — BP 146/65 | HR 66 | Temp 97.5°F | Resp 16 | Ht 66.0 in | Wt 235.0 lb

## 2019-12-02 DIAGNOSIS — M791 Myalgia, unspecified site: Secondary | ICD-10-CM | POA: Diagnosis not present

## 2019-12-02 DIAGNOSIS — R0600 Dyspnea, unspecified: Secondary | ICD-10-CM | POA: Diagnosis not present

## 2019-12-02 DIAGNOSIS — I10 Essential (primary) hypertension: Secondary | ICD-10-CM | POA: Diagnosis not present

## 2019-12-02 DIAGNOSIS — I872 Venous insufficiency (chronic) (peripheral): Secondary | ICD-10-CM

## 2019-12-02 DIAGNOSIS — E1169 Type 2 diabetes mellitus with other specified complication: Secondary | ICD-10-CM | POA: Diagnosis not present

## 2019-12-02 DIAGNOSIS — R0609 Other forms of dyspnea: Secondary | ICD-10-CM

## 2019-12-02 DIAGNOSIS — R7989 Other specified abnormal findings of blood chemistry: Secondary | ICD-10-CM

## 2019-12-02 DIAGNOSIS — Z0000 Encounter for general adult medical examination without abnormal findings: Secondary | ICD-10-CM

## 2019-12-02 DIAGNOSIS — E785 Hyperlipidemia, unspecified: Secondary | ICD-10-CM | POA: Diagnosis not present

## 2019-12-02 DIAGNOSIS — T466X5A Adverse effect of antihyperlipidemic and antiarteriosclerotic drugs, initial encounter: Secondary | ICD-10-CM

## 2019-12-02 MED ORDER — METFORMIN HCL 500 MG PO TABS: 500.0000 mg | ORAL_TABLET | Freq: Two times a day (BID) | ORAL | 3 refills | Status: DC

## 2019-12-02 NOTE — Progress Notes (Signed)
Subjective:    Patient ID: Yvette [name], female    DOB: [birth year], 74 y.o.   MRN: [id number]  Yvette [name] is a 74 y.o. female presenting on 12/02/2019 for Annual Exam   HPI   Here for Annual Physical and Lab Review.  Type 2 Diabetes/ Hyperglycemia/ Morbid Obesity BMI >37 - Last visit with me 08/2019, for same problem with new diagnosis Type 2 DM treated with new start metformin, see prior notes for background information. - Interval update with improvement - Today patient reports A1c now 6.5, previous 6.5 Meds: Metformin 500 BID - Of note she called insurance, Ozempic, Trulicity covered Currently on ACEi, ASA 81, statin Lifestyle: - Weight down 10 lbs - Diet goal to improve diet still - Exercise: Limited due to arthritisand also more sedentary limited ability to go exercise now with COVID19 pandemic Admits had some polyuria prior, now seems to be reduced  08/2019 - Dr Wyatt [facility] [place], mild cataract no surgery needed, no DM retinopathy, should send record. Denies hypoglycemia, visual changes, numbness or tingling.  Elevated Liver Enzymes Recent lab shows elevated AST ALT 60/50, she has known elevated cholesterol History of episodic RUQ discomfort, history of gallbladder flare, still has gallbladder.  HYPERLIPIDEMIA - Reports concernswith cholesterol, prior history elevated. Last lipid panel2/2021 with some improved LDL and other lipids, cannot take Rosuvastatin   Vitamin B12 Last lab >2000 elevated, she has been taking B12 supplement every other day Vitamin D lab shows improved >30 now  CHRONIC HTN: Admits elevated BP - previously was on amlod 5 and hctz 25 but had polyuria Current Meds -Amlodipine10mg  daily, Lisinopril 40mg  daily, HCTZ12.5mg  daily Reportsnormally good compliance.Tolerating well, w/o complaints. Admits swelling of feet and hands over past 2 weeks, she does limit salt intake Denies CP, dyspnea, HA, dizziness /  lightheadedness  Dyspnea Chronic problem, previous work up was negative for cardiac cause of dyspnea. Did not see Pulmonologist, trial improved on inhaler PRN, using more often now albuterol rescue. Has not tried maintenance therapy   Additional topics  Follow up LE edema venous insufficiency, improved, less red, hasn't taken lasix, swelling improved  Past Surgical History:  Procedure Laterality Date  . ABDOMINAL HYSTERECTOMY    . APPENDECTOMY    . BREAST BIOPSY Left 1970's   neg  . COLONOSCOPY    . FINGER FRACTURE SURGERY      Depression screen [facility] 2/9 12/02/2019 11/30/2019 08/30/2019  Decreased Interest 0 0 0  Down, Depressed, Hopeless 0 0 0  PHQ - 2 Score 0 0 0  Altered sleeping - - 0  Tired, decreased energy - - 0  Change in appetite - - 0  Feeling bad or failure about yourself  - - 0  Trouble concentrating - - 0  Moving slowly or fidgety/restless - - 0  Suicidal thoughts - - 0  PHQ-9 Score - - 0  Difficult doing work/chores - - -    Past Medical History:  Diagnosis Date  . Arthropathy, unspecified, site unspecified   . Esophageal reflux   . Osteoporosis, unspecified   . Other abnormal glucose   . Other and unspecified hyperlipidemia   . Symptomatic menopausal or female climacteric states   . Syncope and collapse    Past Surgical History:  Procedure Laterality Date  . ABDOMINAL HYSTERECTOMY    . APPENDECTOMY    . BREAST BIOPSY Left 1970's   neg  . COLONOSCOPY    . FINGER FRACTURE SURGERY  Social History   Socioeconomic History  . Marital status: Widowed    Spouse name: Not on file  . Number of children: Not on file  . Years of education: Not on file  . Highest education level: Not on file  Occupational History  . Not on file  Tobacco Use  . Smoking status: Former Smoker    Packs/day: 1.00    Years: 26.00    Pack years: 26.00    Types: Cigarettes    Quit date: 06/07/1992    Years since quitting: 27.5  . Smokeless tobacco: Former Chief Strategy Officer and Sexual Activity  . Alcohol use: Not Currently    Alcohol/week: 0.0 standard drinks    Comment: occassional  . Drug use: No  . Sexual activity: Not on file  Other Topics Concern  . Not on file  Social History Narrative  . Not on file   Social Determinants of Health   Financial Resource Strain: Low Risk   . Difficulty of Paying Living Expenses: Not very hard  Food Insecurity: No Food Insecurity  . Worried About Charity fundraiser in the Last Year: Never true  . Ran Out of Food in the Last Year: Never true  Transportation Needs: No Transportation Needs  . Lack of Transportation (Medical): No  . Lack of Transportation (Non-Medical): No  Physical Activity: Insufficiently Active  . Days of Exercise per Week: 2 days  . Minutes of Exercise per Session: 30 min  Stress: No Stress Concern Present  . Feeling of Stress : Not at all  Social Connections: Severely Isolated  . Frequency of Communication with Friends and Family: Once a week  . Frequency of Social Gatherings with Friends and Family: Once a week  . Attends Religious Services: Never  . Active Member of Clubs or Organizations: No  . Attends Archivist Meetings: Never  . Marital Status: Widowed  Intimate Partner Violence: Not At Risk  . Fear of Current or Ex-Partner: No  . Emotionally Abused: No  . Physically Abused: No  . Sexually Abused: No   Family History  Problem Relation Age of Onset  . Diabetes Mother   . Heart disease Mother   . Heart attack Mother   . Ulcers Father   . Gallstones Father   . Heart attack Brother    Current Outpatient Medications on File Prior to Visit  Medication Sig  . Acetaminophen (TYLENOL 8 HOUR ARTHRITIS PAIN PO) Take by mouth. 2 in am 1 in pm  . albuterol (VENTOLIN HFA) 108 (90 Base) MCG/ACT inhaler Inhale 2 puffs into the lungs every 4 (four) hours as needed for wheezing or shortness of breath (cough).  Marland Kitchen amLODipine (NORVASC) 10 MG tablet Take 1 tablet (10 mg  total) by mouth daily.  Marland Kitchen aspirin 81 MG tablet Take 81 mg by mouth daily.  . beta carotene w/minerals (OCUVITE) tablet Take 1 tablet by mouth daily.  . Biotin 5000 MCG TABS Take 1 tablet by mouth daily.  . Calcium-Phosphorus-Vitamin D (CITRACAL +D3 PO) Take 1 Dose by mouth 2 (two) times daily.  . Cetirizine HCl 10 MG CAPS Take by mouth.  Marland Kitchen Co-Enzyme Q-10 100 MG CAPS Take 1 capsule by mouth daily.  . fluticasone (FLONASE) 50 MCG/ACT nasal spray Place 2 sprays into both nostrils daily.  . furosemide (LASIX) 20 MG tablet Take 1-2 tablets (20-40 mg total) by mouth daily as needed for fluid or edema. Recommend use up to 3-5 days at a time if needed.  Marland Kitchen  hydrochlorothiazide (HYDRODIURIL) 25 MG tablet Take 1 tablet (25 mg total) by mouth daily.  Javier Docker Oil 300 MG CAPS Take by mouth daily.  Marland Kitchen lisinopril (ZESTRIL) 40 MG tablet Take 1 tablet (40 mg total) by mouth daily.  . Mag Aspart-Potassium Aspart (POTASSIUM & MAGNESIUM ASPARTAT PO) Take 1 tablet by mouth daily.  . Multiple Vitamin (MULTIVITAMIN) tablet Take 1 tablet by mouth daily.  . mupirocin cream (BACTROBAN) 2 % Apply 1 application topically 2 (two) times daily as needed. To affected area as directed for up to 7-14 days as needed  . pantoprazole (PROTONIX) 40 MG tablet Take 1 tablet (40 mg total) by mouth daily before breakfast.   No current facility-administered medications on file prior to visit.    Review of Systems  Constitutional: Negative for activity change, appetite change, chills, diaphoresis, fatigue and fever.  HENT: Negative for congestion and hearing loss.   Eyes: Negative for visual disturbance.  Respiratory: Positive for shortness of breath. Negative for cough, chest tightness and wheezing.   Cardiovascular: Positive for leg swelling. Negative for chest pain and palpitations.  Gastrointestinal: Negative for abdominal pain, anal bleeding, blood in stool, constipation, diarrhea, nausea and vomiting.  Endocrine: Negative for  cold intolerance.  Genitourinary: Negative for dysuria, frequency and hematuria.  Musculoskeletal: Negative for arthralgias, back pain and neck pain.  Skin: Negative for rash.  Allergic/Immunologic: Negative for environmental allergies.  Neurological: Negative for dizziness, weakness, light-headedness, numbness and headaches.  Hematological: Negative for adenopathy.  Psychiatric/Behavioral: Negative for behavioral problems, dysphoric mood and sleep disturbance. The patient is not nervous/anxious.    Per HPI unless specifically indicated above      Objective:    BP (!) 146/65   Pulse 66   Temp (!) 97.5 F (36.4 C) (Temporal)   Resp 16   Ht 5\' 6"  (1.676 m)   Wt 235 lb (106.6 kg)   BMI 37.93 kg/m   Wt Readings from Last 3 Encounters:  12/02/19 235 lb (106.6 kg)  12/01/19 235 lb (106.6 kg)  11/30/19 248 lb 6.4 oz (112.7 kg)    Physical Exam Vitals and nursing note reviewed.  Constitutional:      General: She is not in acute distress.    Appearance: She is well-developed. She is not diaphoretic.     Comments: Well-appearing, comfortable, cooperative  HENT:     Head: Normocephalic and atraumatic.  Eyes:     General:        Right eye: No discharge.        Left eye: No discharge.     Conjunctiva/sclera: Conjunctivae normal.     Pupils: Pupils are equal, round, and reactive to light.  Neck:     Thyroid: No thyromegaly.  Cardiovascular:     Rate and Rhythm: Normal rate and regular rhythm.     Pulses: Normal pulses.     Heart sounds: Normal heart sounds. No murmur.  Pulmonary:     Effort: Pulmonary effort is normal. No respiratory distress.     Breath sounds: Normal breath sounds. No wheezing or rales.  Abdominal:     General: Bowel sounds are normal. There is no distension.     Palpations: Abdomen is soft. There is no mass.     Tenderness: There is no abdominal tenderness.  Musculoskeletal:        General: No tenderness. Normal range of motion.     Cervical back:  Normal range of motion and neck supple.     Right lower leg:  Edema present.     Left lower leg: Edema (Improved edema, less discoloration, no erythema. no laceration or ulcer) present.     Comments: Upper / Lower Extremities: - Normal muscle tone, strength bilateral upper extremities 5/5, lower extremities 5/5  Lymphadenopathy:     Cervical: No cervical adenopathy.  Skin:    General: Skin is warm and dry.     Findings: No erythema or rash.  Neurological:     Mental Status: She is alert and oriented to person, place, and time.     Comments: Distal sensation intact to light touch all extremities  Psychiatric:        Behavior: Behavior normal.     Comments: Well groomed, good eye contact, normal speech and thoughts    Results for orders placed or performed in visit on 11/25/19  [facility] - Vitamin B12 level  Result Value Ref Range   Vitamin B-12 >2,000 (H) 200 - 1,100 pg/mL  [facility] - VITAMIN D 25 Hydroxy (Vit-D Deficiency, Fractures)  Result Value Ref Range   Vit D, 25-Hydroxy 32 30 - 100 ng/mL  [facility] - Lipid panel physical  Result Value Ref Range   Cholesterol 225 (H) <200 mg/dL   HDL 49 (L) > OR = 50 mg/dL   Triglycerides 254 (H) <150 mg/dL   LDL Cholesterol (Calc) 137 (H) mg/dL (calc)   Total CHOL/HDL Ratio 4.6 <5.0 (calc)   Non-HDL Cholesterol (Calc) 176 (H) <130 mg/dL (calc)  [facility] - CMET w/ GFR CMP Complete Metabolic Panel physical  Result Value Ref Range   Glucose, Bld 149 (H) 65 - 99 mg/dL   BUN 9 7 - 25 mg/dL   Creat 0.63 0.60 - 0.93 mg/dL   GFR, Est Non African American 88 > OR = 60 mL/min/1.91m2   GFR, Est African American 102 > OR = 60 mL/min/1.41m2   BUN/Creatinine Ratio NOT APPLICABLE 6 - 22 (calc)   Sodium 140 135 - 146 mmol/L   Potassium 4.3 3.5 - 5.3 mmol/L   Chloride 104 98 - 110 mmol/L   CO2 28 20 - 32 mmol/L   Calcium 9.3 8.6 - 10.4 mg/dL   Total Protein 6.3 6.1 - 8.1 g/dL   Albumin 4.1 3.6 - 5.1 g/dL   Globulin 2.2 1.9 - 3.7 g/dL (calc)   AG Ratio 1.9 1.0 -  2.5 (calc)   Total Bilirubin 0.6 0.2 - 1.2 mg/dL   Alkaline phosphatase (APISO) 51 37 - 153 U/L   AST 63 (H) 10 - 35 U/L   ALT 53 (H) 6 - 29 U/L  [facility] - CBC with Differential/Platelet physical  Result Value Ref Range   WBC 3.9 3.8 - 10.8 Thousand/uL   RBC 4.25 3.80 - 5.10 Million/uL   Hemoglobin 12.9 11.7 - 15.5 g/dL   HCT 38.6 35.0 - 45.0 %   MCV 90.8 80.0 - 100.0 fL   MCH 30.4 27.0 - 33.0 pg   MCHC 33.4 32.0 - 36.0 g/dL   RDW 13.5 11.0 - 15.0 %   Platelets 166 140 - 400 Thousand/uL   MPV 11.5 7.5 - 12.5 fL   Neutro Abs 2,270 1,500 - 7,800 cells/uL   Lymphs Abs 1,115 850 - 3,900 cells/uL   Absolute Monocytes 402 200 - 950 cells/uL   Eosinophils Absolute 82 15 - 500 cells/uL   Basophils Absolute 31 0 - 200 cells/uL   Neutrophils Relative % 58.2 %   Total Lymphocyte 28.6 %   Monocytes Relative 10.3 %   Eosinophils Relative  2.1 %   Basophils Relative 0.8 %  [facility] - A1c LAB Hemoglobin A1C physical  Result Value Ref Range   Hgb A1c MFr Bld 6.5 (H) <5.7 % of total Hgb   Mean Plasma Glucose 140 (calc)   eAG (mmol/L) 7.7 (calc)      Assessment & Plan:   Problem List Items Addressed This Visit    Venous insufficiency of both lower extremities   Type 2 diabetes mellitus with other specified complication ([place])    Controlled DM, with A1c 6.5 Concern with obesity, HTN, HLD  Plan:  1. Continue Metformin IR 500mg  BID - future dose adjust as needed 2. Encourage improved lifestyle - low carb, low sugar diet, reduce portion size, continue improving regular exercise - home exercise regimen - request to get children to help with getting fresh groceries to limit take out - Discussed GLP1 for future use - she looked into it and Ozempic or Trulicity would be covered options, she declines to start now but reconsider in 6 months      Relevant Medications   metFORMIN (GLUCOPHAGE) 500 MG tablet   Morbid obesity ([place])   Relevant Medications   metFORMIN (GLUCOPHAGE) 500 MG tablet    Hyperlipidemia associated with type 2 diabetes mellitus ([place])    Uncontrolled cholesterol off statin Last lipid panel 11/2019 still elevated LDL History of myalgia intolerance to simvastatin Calculated ASCVD 10 yr risk score elevated  Plan: 1. Hold statin now 2. Continue ASA 81mg  for primary ASCVD risk reduction 3. Encourage improved lifestyle - low carb/cholesterol, reduce portion size, continue improving regular exercise   Repeat Lipid 6 months, consider if need to add low dose Pravastatin intermittent      Relevant Medications   metFORMIN (GLUCOPHAGE) 500 MG tablet   Essential hypertension   Dyspnea    Other Visit Diagnoses    Annual physical exam    -  Primary   Myalgia due to statin       Elevated LFTs          Updated Health Maintenance information Reviewed recent lab results with patient Encouraged improvement to lifestyle with diet and exercise - Goal of weight loss  Additionally - she switched loratadine to cetirizine OTC, she says more tired and napping more. She wants to switch back to loratadine as it was more helpful.  #elevated LFT Likely from hyperlipidemia Re-check CMET LFTs in 6 months, if still elevated can do RUQ [place]   Meds ordered this encounter  Medications  . metFORMIN (GLUCOPHAGE) 500 MG tablet    Sig: Take 1 tablet (500 mg total) by mouth 2 (two) times daily with a meal.    Dispense:  180 tablet    Refill:  3      Follow up plan: Return in about 6 months (around 05/31/2020) for 6 month follow-up Lab results DM, Liver Enzyme, Lipids (Statin? Trulicity?).   Nobie Putnam, [facility] 12/02/2019, 9:04 AM

## 2019-12-02 NOTE — Patient Instructions (Addendum)
Thank you for coming to the office today.  Try sample inhaler  Check with [employer] for cost coverage of the following  - Anoro - Breztri - Symbicort - Breo - Advair - Spiriva - Incruse  6 months we can consider trulicity for sugar Re-check liver and may do ultrasound  Switch cetirizine over to loratadine  DUE for FASTING BLOOD WORK (no food or drink after midnight before the lab appointment, only water or coffee without cream/sugar on the morning of)  SCHEDULE "Lab Only" visit in the morning at the clinic for lab draw in 6 MONTHS   - Make sure Lab Only appointment is at about 1 week before your next appointment, so that results will be available  For Lab Results, once available within 2-3 days of blood draw, you can can log in to MyChart online to view your results and a brief explanation. Also, we can discuss results at next follow-up visit.   Please schedule a Follow-up Appointment to: Return in about 6 months (around 05/31/2020) for 6 month follow-up Lab results DM, Liver Enzyme, Lipids (Statin? Trulicity?).  If you have any other questions or concerns, please feel free to call the office or send a message through [facility]. You may also schedule an earlier appointment if necessary.  Additionally, you may be receiving a survey about your experience at our office within a few days to 1 week by e-mail or mail. We value your feedback.  Nobie Putnam, DO [place]

## 2019-12-02 NOTE — Assessment & Plan Note (Signed)
Controlled DM, with A1c 6.5 Concern with obesity, HTN, HLD  Plan:  1. Continue Metformin IR 500mg  BID - future dose adjust as needed 2. Encourage improved lifestyle - low carb, low sugar diet, reduce portion size, continue improving regular exercise - home exercise regimen - request to get children to help with getting fresh groceries to limit take out - Discussed GLP1 for future use - she looked into it and Ozempic or Trulicity would be covered options, she declines to start now but reconsider in 6 months

## 2019-12-02 NOTE — Assessment & Plan Note (Signed)
Uncontrolled cholesterol off statin Last lipid panel 11/2019 still elevated LDL History of myalgia intolerance to simvastatin Calculated ASCVD 10 yr risk score elevated  Plan: 1. Hold statin now 2. Continue ASA 81mg  for primary ASCVD risk reduction 3. Encourage improved lifestyle - low carb/cholesterol, reduce portion size, continue improving regular exercise   Repeat Lipid 6 months, consider if need to add low dose Pravastatin intermittent

## 2019-12-16 IMAGING — DX DG LUMBAR SPINE COMPLETE 4+V
6 series · 6 of 6 positions shown · non-contrast
Comparison: Coronal and sagittal CT images through the lumbar spine
June 06, 2015

CLINICAL DATA: Intermittent low back pain for the past 7-8 months
associated with bilateral leg weakness with tingling and numbness.
History of a coccygeal fracture during childbirth. Subsequent
coccygeal removal.

EXAM:
Lumbar spine, complete four+ view

[l-spine ap]
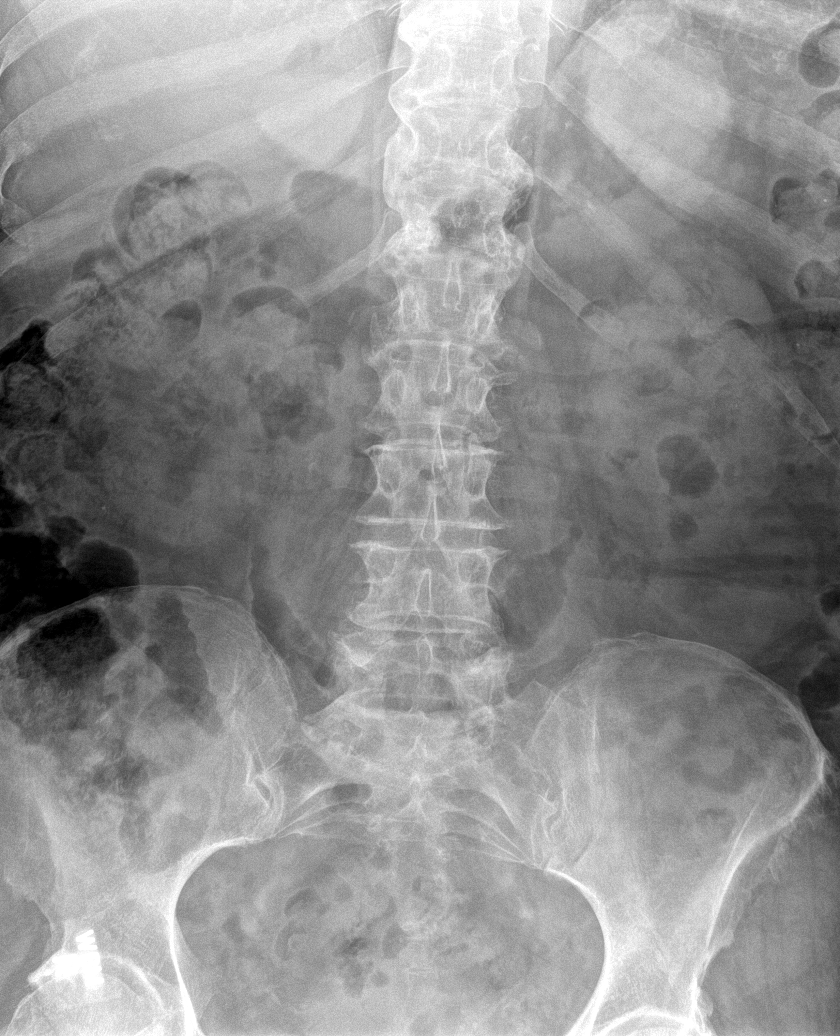

[l-spine obl (1 of 3)]
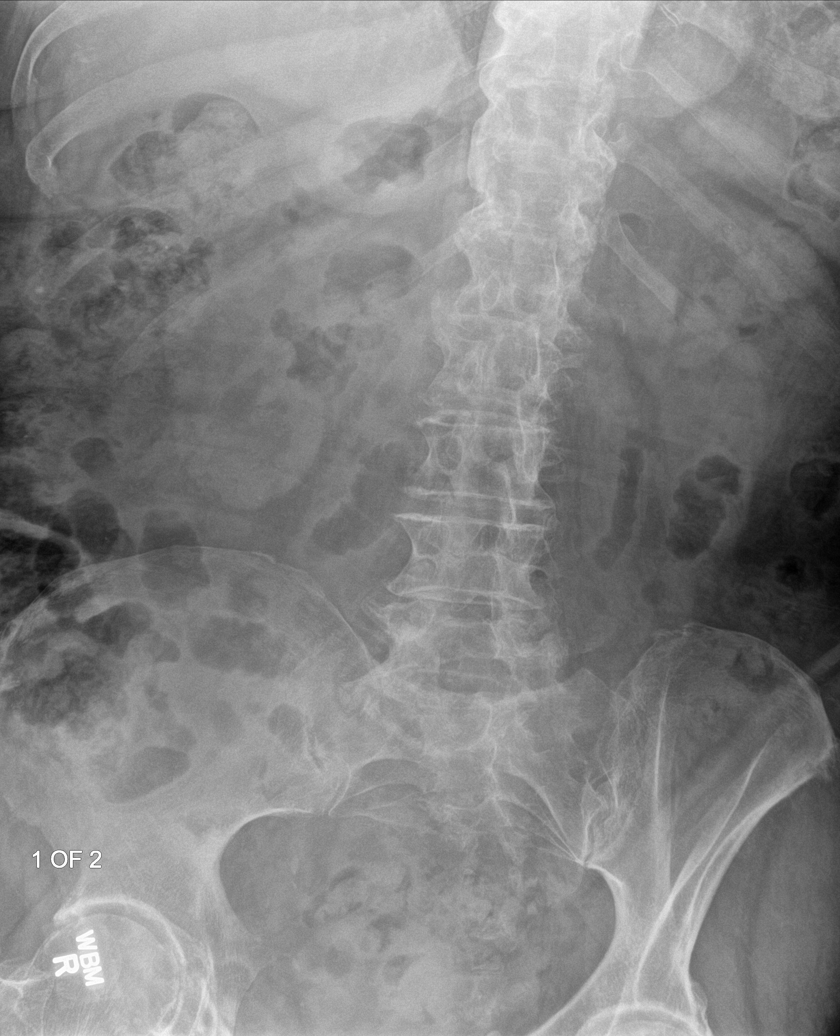

[l-spine obl (2 of 3)]
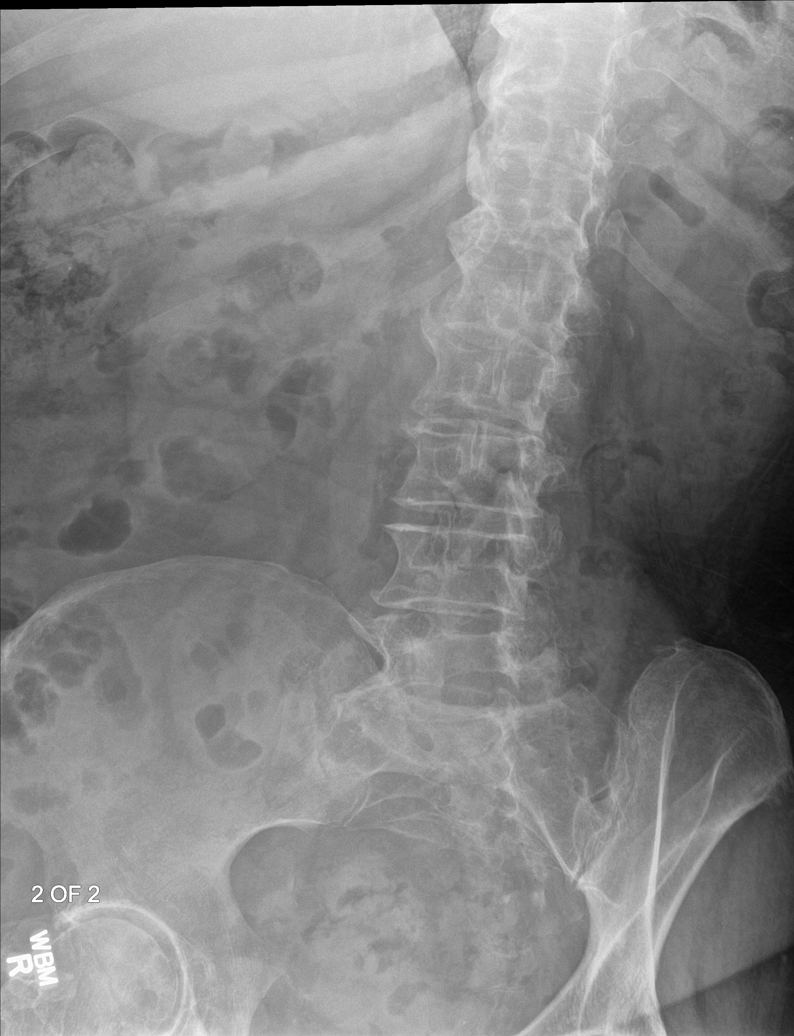

[l-spine lat]
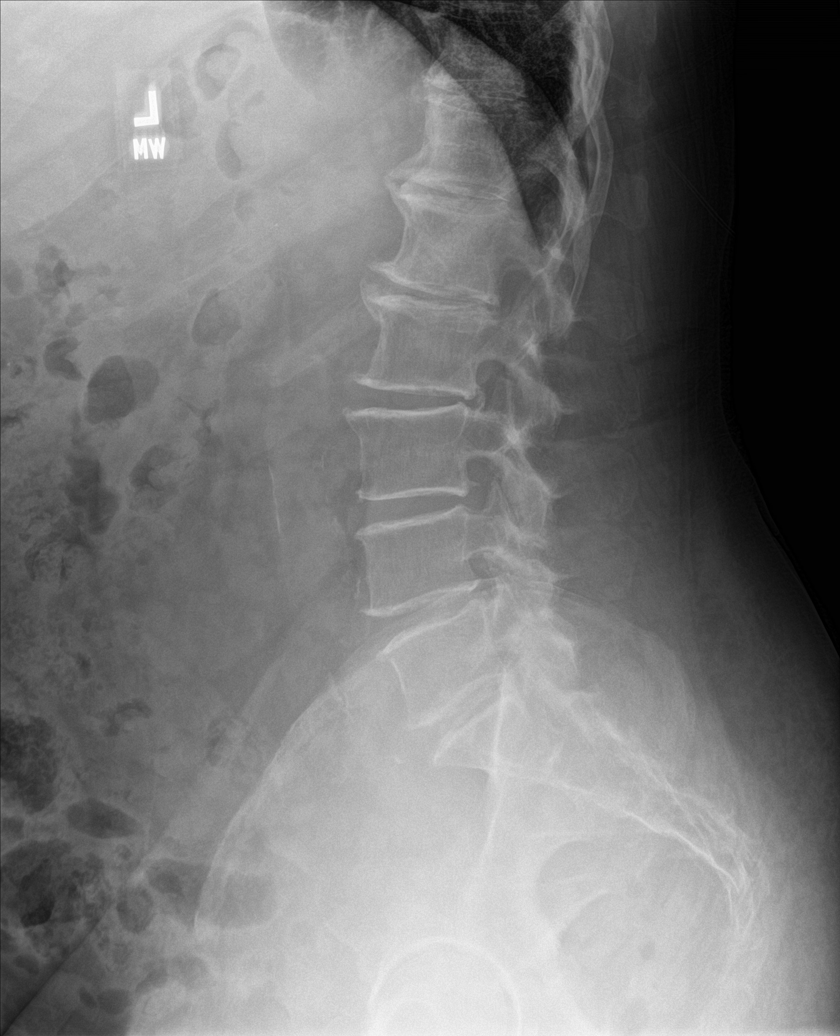

[l-spine spot]
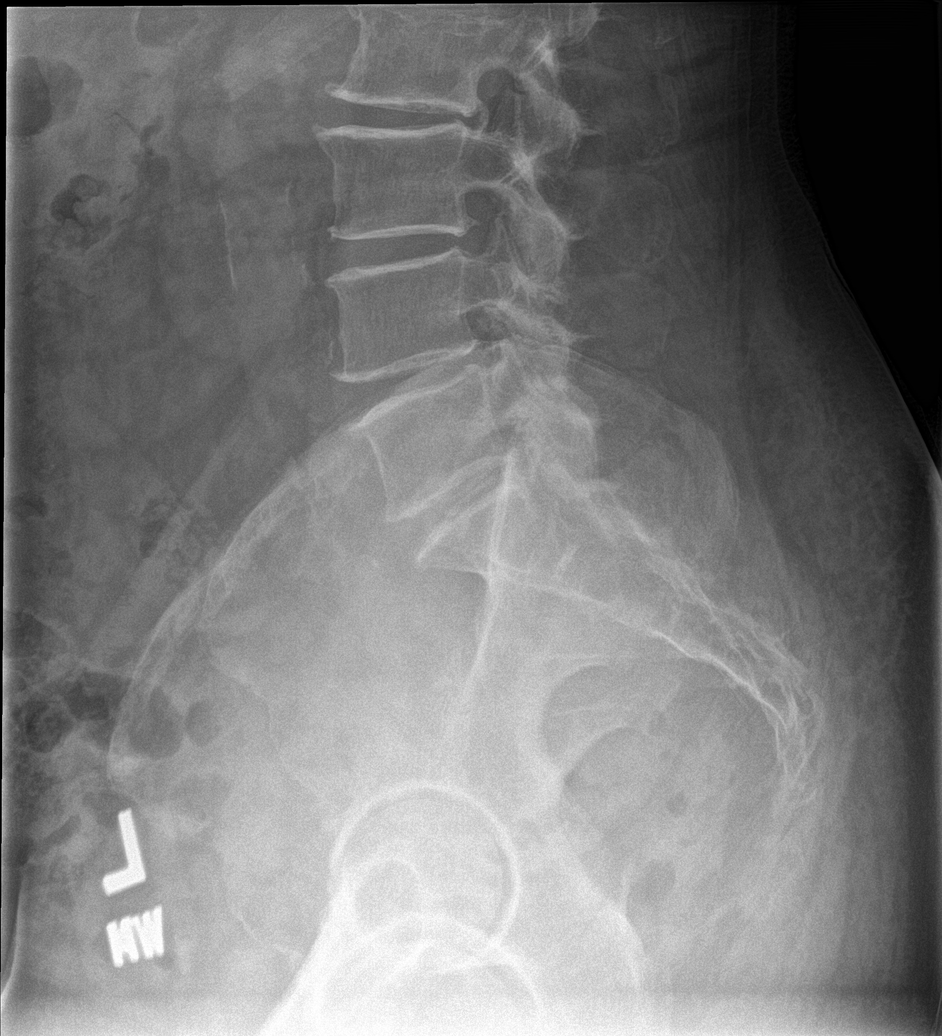

[l-spine obl (3 of 3)]
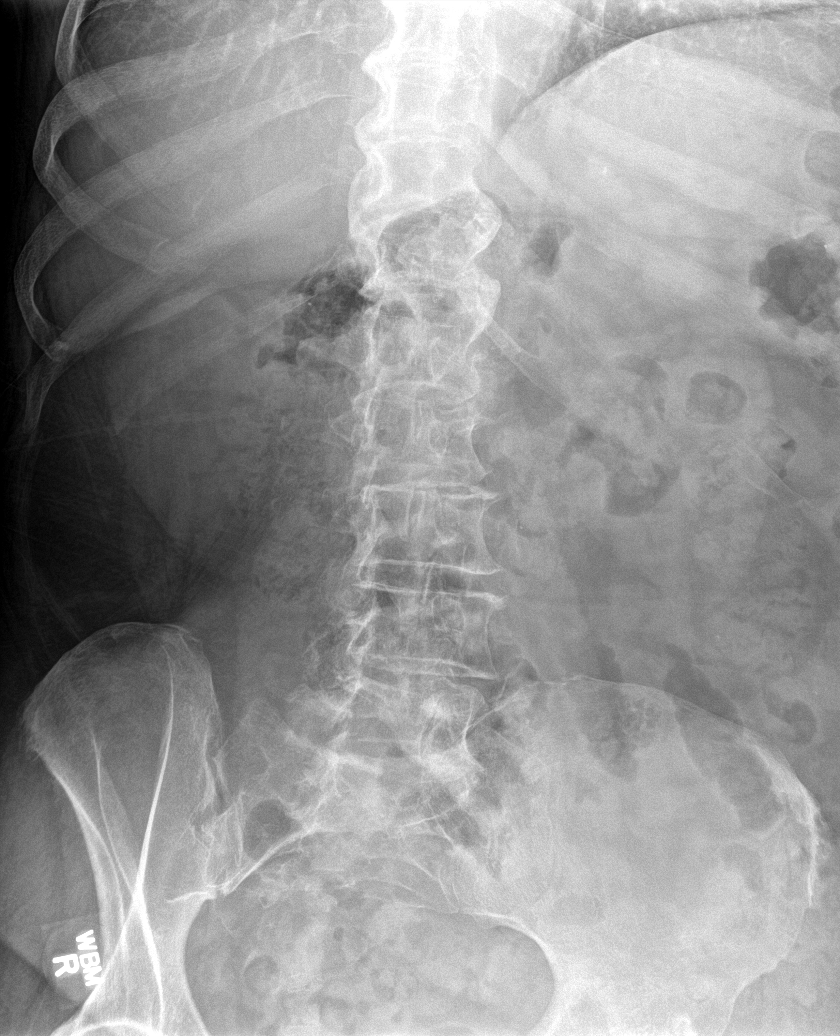

[6 of 6 positions shown; findings below may reference images not displayed]

FINDINGS: The lumbar vertebral bodies are preserved in height. The pedicles
and transverse processes are intact where visualized. There is mild
disc space narrowing at L1-2, L2-3, and L4-5. There is no
spondylolisthesis. There is facet joint hypertrophy at L5-S1. The
observed portions of the sacrum are normal. There are prominent
anterior endplate osteophytes in the lower thoracic and upper lumbar
spine.
IMPRESSION: Mild multilevel degenerative disc disease of the lumbar spine with
some involvement of the lower thoracic spine. No compression
fracture or spondylolisthesis.

## 2020-01-10 NOTE — Telephone Encounter (Signed)
Scheduled for tomorrow 4/6  Nobie Putnam, DO [facility] 01/10/2020, 3:14 PM

## 2020-01-11 ENCOUNTER — Ambulatory Visit (INDEPENDENT_AMBULATORY_CARE_PROVIDER_SITE_OTHER): Payer: Medicare HMO | Admitting: Family Medicine

## 2020-01-11 ENCOUNTER — Other Ambulatory Visit: Payer: Self-pay

## 2020-01-11 ENCOUNTER — Encounter: Payer: Self-pay | Admitting: Family Medicine

## 2020-01-11 VITALS — BP 143/63 | HR 71 | Temp 98.0°F | Ht 66.0 in | Wt 233.0 lb

## 2020-01-11 DIAGNOSIS — M47816 Spondylosis without myelopathy or radiculopathy, lumbar region: Secondary | ICD-10-CM | POA: Diagnosis not present

## 2020-01-11 DIAGNOSIS — G8929 Other chronic pain: Secondary | ICD-10-CM

## 2020-01-11 DIAGNOSIS — M5441 Lumbago with sciatica, right side: Secondary | ICD-10-CM

## 2020-01-11 MED ORDER — BACLOFEN 10 MG PO TABS: 5.0000 mg | ORAL_TABLET | Freq: Two times a day (BID) | ORAL | 2 refills | Status: DC | PRN

## 2020-01-11 MED ORDER — NAPROXEN 500 MG PO TABS: 500.0000 mg | ORAL_TABLET | Freq: Two times a day (BID) | ORAL | 2 refills | Status: DC

## 2020-01-11 NOTE — Patient Instructions (Addendum)
Thank you for coming to the office today.  1. For your Back Pain - I think that this is due to Muscle Spasms or strain. Your Sciatic Nerve can be affected causing some of your radiation and numbness down your legs. 2. Start with anti-inflammatory Naproxen 500mg  twice daily (12 hrs apart, with food, breakfast and dinner) every day for next 2 to 4 weeks if helping, then can use only as needed 3. Start Baclofen (Lioresal) 10mg  tablets - cut in half for 5mg  at night for muscle relaxant - may make you sedated or sleepy (be careful driving or working on this) if tolerated you can take every 8 to 12 hours, half or whole tab 4. May use Tylenol Extra Str 500mg  tabs - may take 1-2 tablets every 6 hours as needed 5. Recommend to start using heating pad on your lower back 1-2x daily for few weeks  Also try a Wedge Seat Cushion to avoid nerve pinching when sitting prolonged period of time.  May consider switch Baclofen over to Gabapentin in future if needed for nerve pain and to help rest.  This pain may take weeks to months to fully resolve, but hopefully it will respond to the medicine initially. All back injuries (small or serious) are slow to heal since we use our back muscles every day. Be careful with turning, twisting, lifting, sitting / standing for prolonged periods, and avoid re-injury.  If your symptoms significantly worsen with more pain, or new symptoms with weakness in one or both legs, new or different shooting leg pains, numbness in legs or groin, loss of control or retention of urine or bowel movements, please call back for advice and you may need to go directly to the Emergency Department.   Please schedule a Follow-up Appointment to: Return if symptoms worsen or fail to improve, for back pain.  If you have any other questions or concerns, please feel free to call the office or send a message through [facility]. You may also schedule an earlier appointment if necessary.  Additionally, you may  be receiving a survey about your experience at our office within a few days to 1 week by e-mail or mail. We value your feedback.  Nobie Putnam, DO [place]

## 2020-01-11 NOTE — Progress Notes (Addendum)
Subjective:    Patient ID: Elie Confer, female    DOB: 1946/04/08, 74 y.o.   MRN: [id number]  RABECKA SHATTUCK is a 74 y.o. female presenting on 01/11/2020 for Back Pain (Lower back pain since last tuesday and since has been having pain but isn't as severe now. 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, last MRI 2019  Reports onset since last 1 week was severe up to 8 to 10 out of 10, she said woke up overnight next night with the pain last week, described a compression feeling of low back and hips, with some radiating pain into both legs, not just one, mostly in anterior aspect of legs, compared to back side of legs - Today significantly improved but still has some residual symptoms - No injury or trigger at onset of pain - Describes that the pain has interfered with her sleep over past week - Tried Tylenol Ext Str 500mg  x 2 in AM, 1 at lunch, then 2 at PM - Tried Heat / Ice packs and muscle rubs OTC with limited relief - Described pain could not get comfortable had pressure on buttocks and low back, even sitting in recliner it caused her some persistent back pain. She describes having lumbar support in her recliner  Previously more Left sided, now improved. Seems to be worse on R >L  In past was on Naproxen PRN for knee pain, did well with this. Not taking any NSAIDs now.  - Denies any fevers/chills, numbness, tingling, weakness, loss of control bladder/bowel incontinence or retention, unintentional wt loss, night sweats     Depression screen [facility] 2/9 12/02/2019 11/30/2019 08/30/2019  Decreased Interest 0 0 0  Down, Depressed, Hopeless 0 0 0  PHQ - 2 Score 0 0 0  Altered sleeping - - 0  Tired, decreased energy - - 0  Change in appetite - - 0  Feeling bad or failure about yourself  - - 0  Trouble  concentrating - - 0  Moving slowly or fidgety/restless - - 0  Suicidal thoughts - - 0  PHQ-9 Score - - 0  Difficult doing work/chores - - -    Social History   Tobacco Use  . Smoking status: Former Smoker    Packs/day: 1.00    Years: 26.00    Pack years: 26.00    Types: Cigarettes    Quit date: 06/07/1992    Years since quitting: 27.6  . Smokeless tobacco: Former Network engineer Use Topics  . Alcohol use: Not Currently    Alcohol/week: 0.0 standard drinks    Comment: occassional  . Drug use: No    Review of Systems Per HPI unless specifically indicated above     Objective:    BP (!) 143/63   Pulse 71   Temp 98 F (36.7 C) (Temporal)   Ht 5\' 6"  (1.676 m)   Wt 233 lb (105.7 kg)   SpO2 97%   BMI 37.61 kg/m   Wt Readings from Last 3 Encounters:  01/11/20 233 lb (105.7 kg)  12/02/19 235 lb (106.6 kg)  12/01/19 235 lb (106.6 kg)    Physical Exam Vitals and nursing note reviewed.  Constitutional:      General: She is not in acute distress.  Appearance: She is well-developed. She is not diaphoretic.     Comments: Well-appearing, comfortable, cooperative  HENT:     Head: Normocephalic and atraumatic.  Eyes:     General:        Right eye: No discharge.        Left eye: No discharge.     Conjunctiva/sclera: Conjunctivae normal.  Cardiovascular:     Rate and Rhythm: Normal rate.  Pulmonary:     Effort: Pulmonary effort is normal.  Musculoskeletal:     Comments: Low Back Inspection: Normal appearance, Large body habitus, no spinal deformity, symmetrical. Palpation: No tenderness over spinous processes. Bilateral lumbar paraspinal muscles R>L mild-tender and w significant hypertonicity/spasm. ROM: Full active ROM forward flex / back extension, rotation L/R without discomfort Special Testing: Seated SLR negative for radicular pain bilaterally  Strength: Bilateral hip flex/ext 5/5, knee flex/ext 5/5, ankle dorsiflex/plantarflex 5/5 Neurovascular: intact distal  sensation to light touch   Skin:    General: Skin is warm and dry.     Findings: No erythema or rash.  Neurological:     Mental Status: She is alert and oriented to person, place, and time.  Psychiatric:        Behavior: Behavior normal.     Comments: Well groomed, good eye contact, normal speech and thoughts      I have personally reviewed the radiology report from Lumbar MRI on 11/08/17.  CLINICAL DATA:  Low back pain for 3 years. No pain radiating into the legs.  EXAM: MRI LUMBAR SPINE WITHOUT CONTRAST  TECHNIQUE: Multiplanar, multisequence MR imaging of the lumbar spine was performed. No intravenous contrast was administered.  COMPARISON:  None.  FINDINGS: Segmentation:  Standard.  Alignment:  2 mm retrolisthesis of L1 on L2 and L2 on L3.  Vertebrae:  No fracture, evidence of discitis, or bone lesion.  Conus medullaris and cauda equina: Conus extends to the L1 level. Conus and cauda equina appear normal.  Paraspinal and other soft tissues: No acute paraspinal abnormality. Bilateral parapelvic cysts.  Disc levels:  Disc spaces: Degenerative disc disease with disc height loss at L1-2 and L2-3. Disc desiccation throughout the lumbar spine.  T12-L1: No significant disc bulge. No evidence of neural foraminal stenosis. No central canal stenosis.  L1-L2: Broad-based disc bulge eccentric towards the left. No evidence of neural foraminal stenosis. No central canal stenosis.  L2-L3: Broad-based disc bulge with a small central disc protrusion. Mild bilateral facet arthropathy ligamentum flavum infolding. Mild spinal stenosis. No evidence of neural foraminal stenosis.  L3-L4: Broad-based disc bulge flattening the ventral thecal sac. Mild bilateral facet arthropathy. Mild spinal stenosis. Right lateral recess narrowing. No evidence of neural foraminal stenosis.  L4-L5: Broad-based disc bulge flattening the ventral thecal sac. Mild bilateral facet  arthropathy. Bilateral lateral recess stenosis. Mild bilateral foraminal stenosis. No central canal stenosis.  L5-S1: Minimal broad-based disc bulge. Mild bilateral facet arthropathy. No evidence of neural foraminal stenosis. No central canal stenosis.  IMPRESSION: 1. Diffuse lumbar spine spondylosis as described above. 2. At L2-3 there is a broad-based disc bulge with a small central disc protrusion. Mild bilateral facet arthropathy ligamentum flavum infolding. Mild spinal stenosis. 3.  No acute osseous injury of the lumbar spine.   Electronically Signed   By: Kathreen Devoid   On: 11/08/2017 10:23  Results for orders placed or performed in visit on 11/25/19  [facility] - Vitamin B12 level  Result Value Ref Range   Vitamin B-12 >2,000 (H) 200 - 1,100 pg/mL  [place] - VITAMIN D 25 Hydroxy (Vit-D Deficiency, Fractures)  Result Value Ref Range   Vit D, 25-Hydroxy 32 30 - 100 ng/mL  [facility] - Lipid panel physical  Result Value Ref Range   Cholesterol 225 (H) <200 mg/dL   HDL 49 (L) > OR = 50 mg/dL   Triglycerides 254 (H) <150 mg/dL   LDL Cholesterol (Calc) 137 (H) mg/dL (calc)   Total CHOL/HDL Ratio 4.6 <5.0 (calc)   Non-HDL Cholesterol (Calc) 176 (H) <130 mg/dL (calc)  [facility] - CMET w/ GFR CMP Complete Metabolic Panel physical  Result Value Ref Range   Glucose, Bld 149 (H) 65 - 99 mg/dL   BUN 9 7 - 25 mg/dL   Creat 0.63 0.60 - 0.93 mg/dL   GFR, Est Non African American 88 > OR = 60 mL/min/1.30m2   GFR, Est African American 102 > OR = 60 mL/min/1.69m2   BUN/Creatinine Ratio NOT APPLICABLE 6 - 22 (calc)   Sodium 140 135 - 146 mmol/L   Potassium 4.3 3.5 - 5.3 mmol/L   Chloride 104 98 - 110 mmol/L   CO2 28 20 - 32 mmol/L   Calcium 9.3 8.6 - 10.4 mg/dL   Total Protein 6.3 6.1 - 8.1 g/dL   Albumin 4.1 3.6 - 5.1 g/dL   Globulin 2.2 1.9 - 3.7 g/dL (calc)   AG Ratio 1.9 1.0 - 2.5 (calc)   Total Bilirubin 0.6 0.2 - 1.2 mg/dL   Alkaline phosphatase (APISO) 51 37 - 153 U/L   AST 63 (H) 10 -  35 U/L   ALT 53 (H) 6 - 29 U/L  [facility] - CBC with Differential/Platelet physical  Result Value Ref Range   WBC 3.9 3.8 - 10.8 Thousand/uL   RBC 4.25 3.80 - 5.10 Million/uL   Hemoglobin 12.9 11.7 - 15.5 g/dL   HCT 38.6 35.0 - 45.0 %   MCV 90.8 80.0 - 100.0 fL   MCH 30.4 27.0 - 33.0 pg   MCHC 33.4 32.0 - 36.0 g/dL   RDW 13.5 11.0 - 15.0 %   Platelets 166 140 - 400 Thousand/uL   MPV 11.5 7.5 - 12.5 fL   Neutro Abs 2,270 1,500 - 7,800 cells/uL   Lymphs Abs 1,115 850 - 3,900 cells/uL   Absolute Monocytes 402 200 - 950 cells/uL   Eosinophils Absolute 82 15 - 500 cells/uL   Basophils Absolute 31 0 - 200 cells/uL   Neutrophils Relative % 58.2 %   Total Lymphocyte 28.6 %   Monocytes Relative 10.3 %   Eosinophils Relative 2.1 %   Basophils Relative 0.8 %  [facility] - A1c LAB Hemoglobin A1C physical  Result Value Ref Range   Hgb A1c MFr Bld 6.5 (H) <5.7 % of total Hgb   Mean Plasma Glucose 140 (calc)   eAG (mmol/L) 7.7 (calc)      Assessment & Plan:   Problem List Items Addressed This Visit    Spondylosis of lumbar region without myelopathy or radiculopathy - Primary   Relevant Medications   naproxen (NAPROSYN) 500 MG tablet   baclofen (LIORESAL) 10 MG tablet   Chronic right-sided low back pain with sciatica   Relevant Medications   naproxen (NAPROSYN) 500 MG tablet   baclofen (LIORESAL) 10 MG tablet      Acute on chronic R>L LBP with associated R>L sciatica. Suspect likely due to muscle spasm/strain, without known injury or trauma. In setting of known chronic LBP with DJD - No red flag symptoms. Negative SLR for radiculopathy -  Inadequate conservative therapy , not on NSAID  Last MRI lumbar spine 2019  Plan: - May start treatment now if need but can discontinue and use PRN if resolved. She may keep rx and follow plan if recurrent back pain flare similar to this one.  1. Start anti-inflammatory trial with rx Naproxen 500mg  BID wc x 1-2 weeks, then PRN 2. Start muscle relaxant with  Baclofen 10mg  tabs - take 5-10mg  up to TID PRN, titrate up as tolerated 3. May use Tylenol PRN for breakthrough 4. Encouraged use of heating pad, ice, muscle rub PRN if helping 5. Follow-up 4-6 weeks if not improved for re-evaluation, consider repeat X-ray imaging, trial of PT, and possibly referral to Orthopedic  Also if med adjust needed we can consider trial switch muscle relaxant to a Gabapentin nightly or during day if need due to sciatica. If need short term we can do prednisone burst.   Meds ordered this encounter  Medications  . naproxen (NAPROSYN) 500 MG tablet    Sig: Take 1 tablet (500 mg total) by mouth 2 (two) times daily with a meal. For 1-2 weeks then as needed    Dispense:  60 tablet    Refill:  2  . baclofen (LIORESAL) 10 MG tablet    Sig: Take 0.5-1 tablets (5-10 mg total) by mouth 2 (two) times daily as needed for muscle spasms.    Dispense:  30 each    Refill:  2      Follow up plan: Return if symptoms worsen or fail to improve, for back pain.  Nobie Putnam, [facility] 01/11/2020, 3:13 PM

## 2020-01-14 ENCOUNTER — Other Ambulatory Visit: Payer: Self-pay | Admitting: Family Medicine

## 2020-01-16 NOTE — Telephone Encounter (Signed)
This medicine was discontinued after it was completed, it looks like last year. Can you call patient to see if she is supposed to continue this one or if she is requesting this refill?  Nobie Putnam, [facility] 01/16/2020, 9:55 AM

## 2020-01-18 ENCOUNTER — Telehealth: Payer: Medicare HMO

## 2020-01-26 ENCOUNTER — Other Ambulatory Visit: Payer: Self-pay | Admitting: Family Medicine

## 2020-01-27 ENCOUNTER — Other Ambulatory Visit: Payer: Self-pay | Admitting: Family Medicine

## 2020-02-08 ENCOUNTER — Ambulatory Visit: Payer: Medicare HMO

## 2020-02-08 NOTE — Chronic Care Management (AMB) (Signed)
  Care Management   Follow Up Note   02/08/2020 Name: [name] MRN: [id number] DOB: 1945/11/11  Referred by: Olin Hauser, DO Reason for referral : Care Coordination   Yvette [name] is a 74 y.o. year old female who is a primary care patient of Olin Hauser, DO. The care management team was consulted for assistance with care management and care coordination needs.    Review of patient status, including review of consultants reports, relevant laboratory and other test results, and collaboration with appropriate care team members and the patient's provider was performed as part of comprehensive patient evaluation and provision of chronic care management services.    LCSW completed CCM outreach attempt today but was unable to reach patient successfully. A HIPPA compliant voice message was left encouraging patient to return call once available. LCSW rescheduled CCM SW appointment as well.  A HIPPA compliant phone message was left for the patient providing contact information and requesting a return call.   [name], BSW, MSW, [email or web address] Phone: [phone]

## 2020-03-06 DIAGNOSIS — G8929 Other chronic pain: Secondary | ICD-10-CM

## 2020-03-06 DIAGNOSIS — M47816 Spondylosis without myelopathy or radiculopathy, lumbar region: Secondary | ICD-10-CM

## 2020-03-08 MED ORDER — PREDNISONE 10 MG PO TABS: ORAL_TABLET | ORAL | 0 refills | Status: DC

## 2020-03-08 MED ORDER — GABAPENTIN 100 MG PO CAPS: ORAL_CAPSULE | ORAL | 1 refills | Status: DC

## 2020-03-08 NOTE — Addendum Note (Signed)
Addended by: Olin Hauser on: 03/08/2020 11:15 AM   Modules accepted: Orders

## 2020-04-07 ENCOUNTER — Other Ambulatory Visit: Payer: Self-pay

## 2020-04-07 ENCOUNTER — Encounter: Payer: Self-pay | Admitting: Family Medicine

## 2020-04-07 ENCOUNTER — Ambulatory Visit (INDEPENDENT_AMBULATORY_CARE_PROVIDER_SITE_OTHER): Payer: Medicare HMO | Admitting: Family Medicine

## 2020-04-07 VITALS — BP 141/66 | HR 82 | Temp 98.0°F | Ht 66.0 in | Wt 231.0 lb

## 2020-04-07 DIAGNOSIS — L0291 Cutaneous abscess, unspecified: Secondary | ICD-10-CM | POA: Insufficient documentation

## 2020-04-07 DIAGNOSIS — N760 Acute vaginitis: Secondary | ICD-10-CM | POA: Diagnosis not present

## 2020-04-07 MED ORDER — SULFAMETHOXAZOLE-TRIMETHOPRIM 800-160 MG PO TABS: 1.0000 | ORAL_TABLET | Freq: Two times a day (BID) | ORAL | 0 refills | Status: AC

## 2020-04-07 MED ORDER — FLUCONAZOLE 150 MG PO TABS: ORAL_TABLET | ORAL | 0 refills | Status: DC

## 2020-04-07 NOTE — Progress Notes (Signed)
Subjective:    Patient ID: Yvette [name], female    DOB: Mar 02, 1946, 74 y.o.   MRN: [id number]  Yvette [name] is a 74 y.o. female presenting on 04/07/2020 for Cyst [phone] started getting bigger since then, hasnt bothered her until now,between shoulder blades, red, painful, itching, clear/ brown drainage, tender to touch  )   HPI  Yvette [name] presents to clinic with concerns of a cyst of her mid upper back.  Reports this has been present on/off since 2018, has met with dermatology and had it cut out once in the past, but has returned.  The last few days she has felt itchy, had some redness and pain when her clothing would touch the skin.  Felt her shirt was wet and touched her upper back, found white and brown drainage.  Denies fevers, n/v/d.  Depression screen [facility] 2/9 04/07/2020 12/02/2019 11/30/2019  Decreased Interest 0 0 0  Down, Depressed, Hopeless 0 0 0  PHQ - 2 Score 0 0 0  Altered sleeping 0 - -  Tired, decreased energy 0 - -  Change in appetite 0 - -  Feeling bad or failure about yourself  0 - -  Trouble concentrating 0 - -  Moving slowly or fidgety/restless 0 - -  Suicidal thoughts 0 - -  PHQ-9 Score 0 - -  Difficult doing work/chores Not difficult at all - -    Social History   Tobacco Use  . Smoking status: Former Smoker    Packs/day: 1.00    Years: 26.00    Pack years: 26.00    Types: Cigarettes    Quit date: 06/07/1992    Years since quitting: 27.8  . Smokeless tobacco: Former Network engineer Use Topics  . Alcohol use: Not Currently    Alcohol/week: 0.0 standard drinks    Comment: occassional  . Drug use: No    Review of Systems  Constitutional: Negative.   HENT: Negative.   Eyes: Negative.   Respiratory: Negative.   Cardiovascular: Negative.   Gastrointestinal: Negative.   Endocrine: Negative.   Genitourinary: Negative.   Musculoskeletal: Negative.   Skin:       Cyst mid upper back  Allergic/Immunologic: Negative.   Neurological: Negative.     Hematological: Negative.   Psychiatric/Behavioral: Negative.    Per HPI unless specifically indicated above     Objective:    BP (!) 141/66   Pulse 82   Temp 98 F (36.7 C) (Oral)   Ht _0  (1.676 m)   Wt 231 lb (104.8 kg)   SpO2 99%   BMI 37.28 kg/m   Wt Readings from Last 3 Encounters:  04/07/20 231 lb (104.8 kg)  01/11/20 233 lb (105.7 kg)  12/02/19 235 lb (106.6 kg)    Physical Exam Vitals reviewed.  Constitutional:      General: She is not in acute distress.    Appearance: Normal appearance. She is well-developed and well-groomed. She is obese. She is not ill-appearing or toxic-appearing.  HENT:     Head: Normocephalic and atraumatic.     Nose:     Comments: Lizbeth Bark is in place, covering mouth and nose. Eyes:     General: Lids are normal. Vision grossly intact.        Right eye: No discharge.        Left eye: No discharge.     Extraocular Movements: Extraocular movements intact.     Conjunctiva/sclera: Conjunctivae normal.     Pupils: Pupils  are equal, round, and reactive to light.  Cardiovascular:     Pulses: Normal pulses.          Dorsalis pedis pulses are 2+ on the right side and 2+ on the left side.  Pulmonary:     Effort: Pulmonary effort is normal. No respiratory distress.  Musculoskeletal:     Right lower leg: No edema.     Left lower leg: No edema.  Skin:    General: Skin is warm and dry.     Capillary Refill: Capillary refill takes less than 2 seconds.     Findings: Abscess present.          Comments: No area of fluctuance, hardened, red, approx 1.5 inches, scabbed over area of drainage  Neurological:     General: No focal deficit present.     Mental Status: She is alert and oriented to person, place, and time.  Psychiatric:        Attention and Perception: Attention and perception normal.        Mood and Affect: Mood and affect normal.        Speech: Speech normal.        Behavior: Behavior normal. Behavior is cooperative.         Thought Content: Thought content normal.        Cognition and Memory: Cognition and memory normal.        Judgment: Judgment normal.     Results for orders placed or performed in visit on 11/25/19  [facility] - Vitamin B12 level  Result Value Ref Range   Vitamin B-12 >2,000 (H) 200 - 1,100 pg/mL  [facility] - VITAMIN D 25 Hydroxy (Vit-D Deficiency, Fractures)  Result Value Ref Range   Vit D, 25-Hydroxy 32 30 - 100 ng/mL  [facility] - Lipid panel physical  Result Value Ref Range   Cholesterol 225 (H) <200 mg/dL   HDL 49 (L) > OR = 50 mg/dL   Triglycerides 254 (H) <150 mg/dL   LDL Cholesterol (Calc) 137 (H) mg/dL (calc)   Total CHOL/HDL Ratio 4.6 <5.0 (calc)   Non-HDL Cholesterol (Calc) 176 (H) <130 mg/dL (calc)  [facility] - CMET w/ GFR CMP Complete Metabolic Panel physical  Result Value Ref Range   Glucose, Bld 149 (H) 65 - 99 mg/dL   BUN 9 7 - 25 mg/dL   Creat 0.63 0.60 - 0.93 mg/dL   GFR, Est Non African American 88 > OR = 60 mL/min/1.92m   GFR, Est African American 102 > OR = 60 mL/min/1.731m  BUN/Creatinine Ratio NOT APPLICABLE 6 - 22 (calc)   Sodium 140 135 - 146 mmol/L   Potassium 4.3 3.5 - 5.3 mmol/L   Chloride 104 98 - 110 mmol/L   CO2 28 20 - 32 mmol/L   Calcium 9.3 8.6 - 10.4 mg/dL   Total Protein 6.3 6.1 - 8.1 g/dL   Albumin 4.1 3.6 - 5.1 g/dL   Globulin 2.2 1.9 - 3.7 g/dL (calc)   AG Ratio 1.9 1.0 - 2.5 (calc)   Total Bilirubin 0.6 0.2 - 1.2 mg/dL   Alkaline phosphatase (APISO) 51 37 - 153 U/L   AST 63 (H) 10 - 35 U/L   ALT 53 (H) 6 - 29 U/L  [facility] - CBC with Differential/Platelet physical  Result Value Ref Range   WBC 3.9 3.8 - 10.8 Thousand/uL   RBC 4.25 3.80 - 5.10 Million/uL   Hemoglobin 12.9 11.7 - 15.5 g/dL   HCT 38.6 35 - 45 %  MCV 90.8 80.0 - 100.0 fL   MCH 30.4 27.0 - 33.0 pg   MCHC 33.4 32.0 - 36.0 g/dL   RDW 13.5 11.0 - 15.0 %   Platelets 166 140 - 400 Thousand/uL   MPV 11.5 7.5 - 12.5 fL   Neutro Abs 2,270 1,500 - 7,800 cells/uL   Lymphs Abs 1,115 850 - 3,900  cells/uL   Absolute Monocytes 402 200 - 950 cells/uL   Eosinophils Absolute 82 15 - 500 cells/uL   Basophils Absolute 31 0 - 200 cells/uL   Neutrophils Relative % 58.2 %   Total Lymphocyte 28.6 %   Monocytes Relative 10.3 %   Eosinophils Relative 2.1 %   Basophils Relative 0.8 %  [facility] - A1c LAB Hemoglobin A1C physical  Result Value Ref Range   Hgb A1c MFr Bld 6.5 (H) <5.7 % of total Hgb   Mean Plasma Glucose 140 (calc)   eAG (mmol/L) 7.7 (calc)      Assessment & Plan:   Problem List Items Addressed This Visit      Other   Abscess - Primary    Mid upper back abscess, red without area of fluctuance.  Approx 1.5 inches across, localized, no streaking.  Beginning to resolve with scabbed over site of drainage.  No drainage on palpation.  Discussed may need to be excised since is recurrent and would need to be completed with dermatology.  Plan: 1. Begin bactrim DS, 1 tablet 2x per day for the next 5 days 2. Rx for diflucan sent into pharmacy for possible post-antibiotic vaginitis 3. Referral sent to dermatology      Relevant Medications   sulfamethoxazole-trimethoprim (BACTRIM DS) 800-160 MG tablet   Other Relevant Orders   Ambulatory referral to Dermatology    Other Visit Diagnoses    Acute vaginitis       Relevant Medications   fluconazole (DIFLUCAN) 150 MG tablet      Meds ordered this encounter  Medications  . sulfamethoxazole-trimethoprim (BACTRIM DS) 800-160 MG tablet    Sig: Take 1 tablet by mouth 2 (two) times daily for 5 days.    Dispense:  10 tablet    Refill:  0  . fluconazole (DIFLUCAN) 150 MG tablet    Sig: Take 1 tablet at onset of symptoms, can repeat dose 72 hours later    Dispense:  2 tablet    Refill:  0      Follow up plan: Return if symptoms worsen or fail to improve.   Harlin Rain, [place] [facility] 04/07/2020, 12:14 PM

## 2020-04-07 NOTE — Patient Instructions (Addendum)
As we discussed, you have an abscess on your mid upper back.  It would not be of benefit today to open this as there is no area of fluctuance and would would likely not remove any infection this way.  I have sent in a prescription for Bactrim DS to take 1 tablet 2x per day for the next 5 days and for diflucan to take 1 tablet at onset of vaginal itching/discharge and to repeat 72 hours later if symptoms persist.  Follow up with dermatology for skin sac removal  We will plan to see you back as needed for this  You will receive a survey after today's visit either digitally by e-mail or paper by [place] mail. Your experiences and feedback matter to [place].  Please respond so we know how we are doing as we provide care for you.  Call us with any questions/concerns/needs.  It is my goal to be available to you for your health concerns.  Thanks for choosing me to be a partner in your healthcare needs!  Harlin Rain, FNP-C Family Nurse Practitioner [facility] Phone: [phone]

## 2020-04-07 NOTE — Assessment & Plan Note (Signed)
Mid upper back abscess, red without area of fluctuance.  Approx 1.5 inches across, localized, no streaking.  Beginning to resolve with scabbed over site of drainage.  No drainage on palpation.  Discussed may need to be excised since is recurrent and would need to be completed with dermatology.  Plan: 1. Begin bactrim DS, 1 tablet 2x per day for the next 5 days 2. Rx for diflucan sent into pharmacy for possible post-antibiotic vaginitis 3. Referral sent to dermatology

## 2020-04-11 ENCOUNTER — Encounter: Payer: Self-pay | Admitting: Family Medicine

## 2020-04-11 DIAGNOSIS — Z1211 Encounter for screening for malignant neoplasm of colon: Secondary | ICD-10-CM

## 2020-04-13 DIAGNOSIS — L729 Follicular cyst of the skin and subcutaneous tissue, unspecified: Secondary | ICD-10-CM | POA: Diagnosis not present

## 2020-04-25 ENCOUNTER — Ambulatory Visit (INDEPENDENT_AMBULATORY_CARE_PROVIDER_SITE_OTHER): Payer: Medicare HMO | Admitting: Licensed Clinical Social Worker

## 2020-04-25 DIAGNOSIS — F411 Generalized anxiety disorder: Secondary | ICD-10-CM

## 2020-04-25 DIAGNOSIS — I10 Essential (primary) hypertension: Secondary | ICD-10-CM

## 2020-04-25 DIAGNOSIS — E785 Hyperlipidemia, unspecified: Secondary | ICD-10-CM | POA: Diagnosis not present

## 2020-04-25 DIAGNOSIS — E1169 Type 2 diabetes mellitus with other specified complication: Secondary | ICD-10-CM

## 2020-04-26 NOTE — Chronic Care Management (AMB) (Signed)
Chronic Care Management    Clinical Social Work Follow Up Note  04/26/2020 Name: Yvette [name] MRN: [id number] DOB: July 04, 1946  Yvette [name] is a 74 y.o. year old female who is a primary care patient of Olin Hauser, DO. The CCM team was consulted for assistance with Mental Health Counseling and Resources.   Review of patient status, including review of consultants reports, other relevant assessments, and collaboration with appropriate care team members and the patient's provider was performed as part of comprehensive patient evaluation and provision of chronic care management services.    SDOH (Social Determinants of Health) assessments performed: Yes    Outpatient Encounter Medications as of 04/25/2020  Medication Sig Note  . Acetaminophen (TYLENOL 8 HOUR ARTHRITIS PAIN PO) Take by mouth. 2 in am 1 in pm   . albuterol (VENTOLIN HFA) 108 (90 Base) MCG/ACT inhaler Inhale 2 puffs into the lungs every 4 (four) hours as needed for wheezing or shortness of breath (cough).   Marland Kitchen amLODipine (NORVASC) 10 MG tablet Take 1 tablet (10 mg total) by mouth daily.   Marland Kitchen aspirin 81 MG tablet Take 81 mg by mouth daily.   . baclofen (LIORESAL) 10 MG tablet Take 0.5-1 tablets (5-10 mg total) by mouth 2 (two) times daily as needed for muscle spasms.   . beta carotene w/minerals (OCUVITE) tablet Take 1 tablet by mouth daily.   . Biotin 5000 MCG TABS Take 1 tablet by mouth daily.   . Calcium-Phosphorus-Vitamin D (CITRACAL +D3 PO) Take 1 Dose by mouth 2 (two) times daily.   . Cetirizine HCl 10 MG CAPS Take by mouth.   Marland Kitchen Co-Enzyme Q-10 100 MG CAPS Take 1 capsule by mouth daily.   . fluconazole (DIFLUCAN) 150 MG tablet Take 1 tablet at onset of symptoms, can repeat dose 72 hours later   . fluticasone (FLONASE) 50 MCG/ACT nasal spray Place 2 sprays into both nostrils daily. 11/30/2019: Nightly   . furosemide (LASIX) 20 MG tablet Take 1-2 tablets (20-40 mg total) by mouth daily as needed for fluid or  edema. Recommend use up to 3-5 days at a time if needed.   . gabapentin (NEURONTIN) 100 MG capsule Start 1 capsule daily, increase by 1 cap every 2-3 days as tolerated up to 3 times a day, or may take 3 at once in evening.   . hydrochlorothiazide (HYDRODIURIL) 25 MG tablet Take 1 tablet (25 mg total) by mouth daily.   Javier Docker Oil 300 MG CAPS Take by mouth daily.   Marland Kitchen lisinopril (ZESTRIL) 40 MG tablet Take 1 tablet (40 mg total) by mouth daily.   . Mag Aspart-Potassium Aspart (POTASSIUM & MAGNESIUM ASPARTAT PO) Take 1 tablet by mouth daily.   . metFORMIN (GLUCOPHAGE) 500 MG tablet Take 1 tablet (500 mg total) by mouth 2 (two) times daily with a meal.   . Multiple Vitamin (MULTIVITAMIN) tablet Take 1 tablet by mouth daily.   . mupirocin cream (BACTROBAN) 2 % Apply 1 application topically 2 (two) times daily as needed. To affected area as directed for up to 7-14 days as needed   . naproxen (NAPROSYN) 500 MG tablet Take 1 tablet (500 mg total) by mouth 2 (two) times daily with a meal. For 1-2 weeks then as needed   . pantoprazole (PROTONIX) 40 MG tablet Take 1 tablet (40 mg total) by mouth daily before breakfast. (Patient not taking: Reported on 04/07/2020)    No facility-administered encounter medications on file as of 04/25/2020.  Goals Addressed    .  SW- I need additional support and resource connection. I have little support since my spouse passed 4 years ago" (pt-stated)        Current Barriers:  . Acute Mental Health needs related to Anxiety and ongoing grief symptoms  . Financial constraints related to managing health care expenses and affording bills . Limited social support . Limited access to food . Level of care concerns . ADL IADL limitations . Limited access to caregiver . Inability to perform ADL's independently . Inability to perform IADL's independently . Lacks knowledge of community resource: available food support resources  Clinical Social Work Goal(s):  Marland Kitchen Over the next  120 days, patient will work with SW bi-monthly by telephone or in person to reduce or manage symptoms related to anxiety . Over the next 120 days, client will work with SW to address concerns related to gaining additional support/resources in order to improve self-care, physical health, emotional health and overall well-being.   Interventions: . Patient interviewed and appropriate assessments performed: brief mental health assessment . Patient interviewed and appropriate assessments performed . Provided patient with information about available financial and food support resources within the area. Patient is agreeable to MOW referral through [facility] contract. LCSW completed referral and asked for meals to be delivered for 60 days while she is on the wait list for services. UPDATE- Patient is no longer receiving meals but is on wait list. Patient reports that she is actually cooking again and does not need meal prep assistance at this time. Patient reports that she will be cooking ahi tuna steak tonight for herself.   . Discussed plans with patient for ongoing care management follow up and provided patient with direct contact information for care management team . Assisted patient/caregiver with obtaining information about health plan benefits . Provided education and assistance to client regarding Advanced Directives. . Provided education to patient/caregiver regarding level of care options. . 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  Sonia Side confirms stable transportation to all future medical appointments. Marland Kitchen LCSW spent extensive time educating patient on anxiety management coping skills and health self care . Psychoeducation and/or Health Education provided  Patient Self Care Activities:  . Attends all scheduled provider appointments . Calls provider office for new concerns or questions  . Unable  to independently prepare meals for self  Please see past updates related to this goal by clicking on the "Past Updates" button in the selected goal       Follow Up Plan: SW will follow up with patient by phone over the next quarter  [name], [place], MSW, [email or web address] Phone: [phone]

## 2020-05-02 DIAGNOSIS — Z1212 Encounter for screening for malignant neoplasm of rectum: Secondary | ICD-10-CM | POA: Diagnosis not present

## 2020-05-02 DIAGNOSIS — Z1211 Encounter for screening for malignant neoplasm of colon: Secondary | ICD-10-CM | POA: Diagnosis not present

## 2020-05-02 LAB — COLOGUARD: Cologuard: NEGATIVE

## 2020-05-09 LAB — COLOGUARD: COLOGUARD: NEGATIVE

## 2020-05-10 ENCOUNTER — Encounter: Payer: Self-pay | Admitting: Family Medicine

## 2020-05-10 ENCOUNTER — Telehealth: Payer: Self-pay

## 2020-05-10 NOTE — Telephone Encounter (Signed)
Notified patient regarding cologuard result.

## 2020-05-11 DIAGNOSIS — L720 Epidermal cyst: Secondary | ICD-10-CM | POA: Diagnosis not present

## 2020-05-11 DIAGNOSIS — D492 Neoplasm of unspecified behavior of bone, soft tissue, and skin: Secondary | ICD-10-CM | POA: Diagnosis not present

## 2020-05-26 ENCOUNTER — Other Ambulatory Visit: Payer: Medicare HMO

## 2020-05-26 ENCOUNTER — Telehealth: Payer: Self-pay

## 2020-05-26 ENCOUNTER — Other Ambulatory Visit: Payer: Self-pay

## 2020-05-26 DIAGNOSIS — E785 Hyperlipidemia, unspecified: Secondary | ICD-10-CM | POA: Diagnosis not present

## 2020-05-26 DIAGNOSIS — E1169 Type 2 diabetes mellitus with other specified complication: Secondary | ICD-10-CM | POA: Diagnosis not present

## 2020-05-26 DIAGNOSIS — R7989 Other specified abnormal findings of blood chemistry: Secondary | ICD-10-CM

## 2020-05-26 NOTE — Telephone Encounter (Signed)
The pt come in today for lab work prior to appt.

## 2020-05-27 LAB — HEMOGLOBIN A1C
Hgb A1c MFr Bld: 6.3 % of total Hgb — ABNORMAL HIGH (ref <5.7)
Mean Plasma Glucose: 134 (calc)
eAG (mmol/L): 7.4 (calc)

## 2020-05-27 LAB — COMPREHENSIVE METABOLIC PANEL
AG Ratio: 2.3 (calc) (ref 1.0–2.5)
ALT: 31 U/L — ABNORMAL HIGH (ref 6–29)
AST: 40 U/L — ABNORMAL HIGH (ref 10–35)
Albumin: 4.6 g/dL (ref 3.6–5.1)
Alkaline phosphatase (APISO): 50 U/L (ref 37–153)
BUN: 15 mg/dL (ref 7–25)
CO2: 28 mmol/L (ref 20–32)
Calcium: 9.8 mg/dL (ref 8.6–10.4)
Chloride: 97 mmol/L — ABNORMAL LOW (ref 98–110)
Creat: 0.69 mg/dL (ref 0.60–0.93)
Globulin: 2 g/dL (calc) (ref 1.9–3.7)
Glucose, Bld: 152 mg/dL — ABNORMAL HIGH (ref 65–99)
Potassium: 4.5 mmol/L (ref 3.5–5.3)
Sodium: 135 mmol/L (ref 135–146)
Total Bilirubin: 0.8 mg/dL (ref 0.2–1.2)
Total Protein: 6.6 g/dL (ref 6.1–8.1)

## 2020-05-27 LAB — LIPID PANEL
Cholesterol: 242 mg/dL — ABNORMAL HIGH (ref <200)
HDL: 48 mg/dL — ABNORMAL LOW (ref >=50)
Non-HDL Cholesterol (Calc): 194 mg/dL (calc) — ABNORMAL HIGH (ref <130)
Total CHOL/HDL Ratio: 5 (calc) — ABNORMAL HIGH (ref <5.0)
Triglycerides: 410 mg/dL — ABNORMAL HIGH (ref <150)

## 2020-06-02 ENCOUNTER — Encounter: Payer: Self-pay | Admitting: Family Medicine

## 2020-06-02 ENCOUNTER — Other Ambulatory Visit: Payer: Self-pay

## 2020-06-02 ENCOUNTER — Ambulatory Visit (INDEPENDENT_AMBULATORY_CARE_PROVIDER_SITE_OTHER): Payer: Medicare HMO | Admitting: Family Medicine

## 2020-06-02 VITALS — BP 129/65 | HR 85 | Temp 97.1°F | Resp 16 | Ht 66.0 in | Wt 225.0 lb

## 2020-06-02 DIAGNOSIS — G720 Drug-induced myopathy: Secondary | ICD-10-CM | POA: Insufficient documentation

## 2020-06-02 DIAGNOSIS — G8929 Other chronic pain: Secondary | ICD-10-CM

## 2020-06-02 DIAGNOSIS — E1169 Type 2 diabetes mellitus with other specified complication: Secondary | ICD-10-CM

## 2020-06-02 DIAGNOSIS — M47816 Spondylosis without myelopathy or radiculopathy, lumbar region: Secondary | ICD-10-CM | POA: Diagnosis not present

## 2020-06-02 DIAGNOSIS — E785 Hyperlipidemia, unspecified: Secondary | ICD-10-CM | POA: Diagnosis not present

## 2020-06-02 DIAGNOSIS — R7989 Other specified abnormal findings of blood chemistry: Secondary | ICD-10-CM | POA: Diagnosis not present

## 2020-06-02 DIAGNOSIS — M5441 Lumbago with sciatica, right side: Secondary | ICD-10-CM | POA: Diagnosis not present

## 2020-06-02 MED ORDER — EZETIMIBE 10 MG PO TABS: 10.0000 mg | ORAL_TABLET | Freq: Every day | ORAL | 3 refills | Status: DC

## 2020-06-02 MED ORDER — GABAPENTIN 100 MG PO CAPS: 200.0000 mg | ORAL_CAPSULE | Freq: Every day | ORAL | 3 refills | Status: DC

## 2020-06-02 NOTE — Patient Instructions (Addendum)
Thank you for coming to the office today.  High cholesterol still Start Zetia (generic is ezetimibe) 10mg  daily Should not cause side effect  Check into newer injectable meds - Repatha or Praluent - with insurance  Re order Gabapentin 100mg , take 2 at bedtime.  Call insurance find cost and coverage of the following  Rybelsus (pill - oral semaglutide or ozempic) - once daily, taken first thing in morning without other meds Ozempic (injection once week Trulicity (injection once week  We can get these newer meds at low cost if you are interested.  3 benefits - 1 significantly reduced A1c sugar, and may be able to reduce or stop metformin in future - 2 reduced appetite and weight loss with good results - 3 cardiovascular risk reduction, less likely to have heart attack/stroke  Let me know if you want to start one of these, or talk to [place] for financial assistance or med review if you prefer - message me and I can get her to call you.  Please schedule a Follow-up Appointment to: Return in about 6 months (around 12/03/2020) for Follow-up 6 month fasting lab then 1 week later Annual Physical.  If you have any other questions or concerns, please feel free to call the office or send a message through [place]. You may also schedule an earlier appointment if necessary.  Additionally, you may be receiving a survey about your experience at our office within a few days to 1 week by e-mail or mail. We value your feedback.  Nobie Putnam, DO [place]

## 2020-06-02 NOTE — Progress Notes (Signed)
Subjective:    Patient ID: Yvette [name], female    DOB: 10-10-1945, 74 y.o.   MRN: [id number]  Yvette [name] is a 74 y.o. female presenting on 06/02/2020 for Diabetes   HPI   History of back pain / sciatica She is taking Gabapentin 200mg  nightly with good results - she had issues with taking too much Naproxen and Tylenol at that time in past, but now has improved and down no doses of these meds in past 2 weeks.  Type 2 Diabetes/ Hyperglycemia/ Morbid Obesity BMI >36 Improved 7-8 lbs weight loss in past >3 months A1c down to 6.3, with improvement. Meds:Metformin 500 BID - Of note she called insurance, Ozempic, Trulicity covered - but she declines to start today Currently on ACEi, ASA 81, statin Lifestyle: - Diet goal to improve diet still - Exercise: Limited due to arthritisand also more sedentary limited ability to go exercise now with COVID19 pandemic Admits had some polyuria prior, now seems to be reduced  08/2019 - Dr Wyatt [facility] - [place], mild cataract no surgery needed, no DM retinopathy, should send record. Denies hypoglycemia, visual changes, numbness or tingling.  Elevated Liver Enzymes improved liver enzymes, off naproxen and tylenol Still elevated cholesterol History of episodic RUQ discomfort, history of gallbladder flare, still has gallbladder.  HYPERLIPIDEMIA - Reports concernswith cholesterol, prior history elevated. Last lipid 05/2020 persistent elevatedLDL and other lipids, cannot take Rosuvastatin - Never on Zetia or PCSK9   Depression screen [facility] 2/9 06/02/2020 04/07/2020 12/02/2019  Decreased Interest 0 0 0  Down, Depressed, Hopeless 0 0 0  PHQ - 2 Score 0 0 0  Altered sleeping - 0 -  Tired, decreased energy - 0 -  Change in appetite - 0 -  Feeling bad or failure about yourself  - 0 -  Trouble concentrating - 0 -  Moving slowly or fidgety/restless - 0 -  Suicidal thoughts - 0 -  PHQ-9 Score - 0 -  Difficult doing work/chores - Not difficult at  all -    Social History   Tobacco Use  . Smoking status: Former Smoker    Packs/day: 1.00    Years: 26.00    Pack years: 26.00    Types: Cigarettes    Quit date: 06/07/1992    Years since quitting: 28.0  . Smokeless tobacco: Former Network engineer Use Topics  . Alcohol use: Not Currently    Alcohol/week: 0.0 standard drinks    Comment: occassional  . Drug use: No    Review of Systems Per HPI unless specifically indicated above     Objective:    BP 129/65   Pulse 85   Temp (!) 97.1 F (36.2 C) (Temporal)   Resp 16   Ht 5\' 6"  (1.676 m)   Wt 225 lb (102.1 kg)   SpO2 97%   BMI 36.32 kg/m   Wt Readings from Last 3 Encounters:  06/02/20 225 lb (102.1 kg)  04/07/20 231 lb (104.8 kg)  01/11/20 233 lb (105.7 kg)    Physical Exam Vitals and nursing note reviewed.  Constitutional:      General: She is not in acute distress.    Appearance: She is well-developed. She is not diaphoretic.     Comments: Well-appearing, comfortable, cooperative  HENT:     Head: Normocephalic and atraumatic.  Eyes:     General:        Right eye: No discharge.        Left eye: No  discharge.     Conjunctiva/sclera: Conjunctivae normal.  Neck:     Thyroid: No thyromegaly.  Cardiovascular:     Rate and Rhythm: Normal rate and regular rhythm.     Heart sounds: Normal heart sounds. No murmur heard.   Pulmonary:     Effort: Pulmonary effort is normal. No respiratory distress.     Breath sounds: Normal breath sounds. No wheezing or rales.  Musculoskeletal:        General: Normal range of motion.     Cervical back: Normal range of motion and neck supple.     Right lower leg: No edema.     Left lower leg: Edema present.  Lymphadenopathy:     Cervical: No cervical adenopathy.  Skin:    General: Skin is warm and dry.     Findings: No erythema or rash.  Neurological:     Mental Status: She is alert and oriented to person, place, and time.  Psychiatric:        Behavior: Behavior normal.      Comments: Well groomed, good eye contact, normal speech and thoughts      Diabetic Foot Exam - Simple   Simple Foot Form Diabetic Foot exam was performed with the following findings: Yes 06/02/2020 11:30 AM  Visual Inspection No deformities, no ulcerations, no other skin breakdown bilaterally: Yes Sensation Testing Intact to touch and monofilament testing bilaterally: Yes Pulse Check Posterior Tibialis and Dorsalis pulse intact bilaterally: Yes Comments     Results for orders placed or performed in visit on 05/26/20  Hemoglobin A1c  Result Value Ref Range   Hgb A1c MFr Bld 6.3 (H) <5.7 % of total Hgb   Mean Plasma Glucose 134 (calc)   eAG (mmol/L) 7.4 (calc)  Comprehensive metabolic panel  Result Value Ref Range   Glucose, Bld 152 (H) 65 - 99 mg/dL   BUN 15 7 - 25 mg/dL   Creat 0.69 0.60 - 0.93 mg/dL   BUN/Creatinine Ratio NOT APPLICABLE 6 - 22 (calc)   Sodium 135 135 - 146 mmol/L   Potassium 4.5 3.5 - 5.3 mmol/L   Chloride 97 (L) 98 - 110 mmol/L   CO2 28 20 - 32 mmol/L   Calcium 9.8 8.6 - 10.4 mg/dL   Total Protein 6.6 6.1 - 8.1 g/dL   Albumin 4.6 3.6 - 5.1 g/dL   Globulin 2.0 1.9 - 3.7 g/dL (calc)   AG Ratio 2.3 1.0 - 2.5 (calc)   Total Bilirubin 0.8 0.2 - 1.2 mg/dL   Alkaline phosphatase (APISO) 50 37 - 153 U/L   AST 40 (H) 10 - 35 U/L   ALT 31 (H) 6 - 29 U/L  Lipid panel  Result Value Ref Range   Cholesterol 242 (H) <200 mg/dL   HDL 48 (L) > OR = 50 mg/dL   Triglycerides 410 (H) <150 mg/dL   LDL Cholesterol (Calc)  mg/dL (calc)   Total CHOL/HDL Ratio 5.0 (H) <5.0 (calc)   Non-HDL Cholesterol (Calc) 194 (H) <130 mg/dL (calc)      Assessment & Plan:   Problem List Items Addressed This Visit    Type 2 diabetes mellitus with other specified complication (HCC) - Primary    Controlled DM, with A1c 6.3, improved Concern with obesity, HTN, HLD  Plan:  1. Continue Metformin IR 500mg  BID - future dose adjust as needed 2. Encourage improved lifestyle - low carb,  low sugar diet, reduce portion size, continue improving regular exercise  Continue to reconsider GLP1  therapy      Spondylosis of lumbar region without myelopathy or radiculopathy   Relevant Medications   gabapentin (NEURONTIN) 100 MG capsule   Morbid obesity ([place])    Clinically morbid obesity with BMI >36 with comorbid conditions Hypertension, Hyperlipidemia, Pre-Diabetes, Osteoporosis  Goals and strategy for wt loss diet primarily, low carb reduce portions, gradually increasing home exercise plan      Hyperlipidemia associated with type 2 diabetes mellitus ([place])    Uncontrolled cholesterol off statin still 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 START Zetia 10mg  daily, we discussed other option with PCSK9 but she declines today, we will consider in near future, she will check cost. Declines other statin such as Pravastatin due to myopathy  2. Continue ASA 81mg  for primary ASCVD risk reduction 3. 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 Failed Simvastatin, Rosuvastatin and intermittent low dosing      Chronic right-sided low back pain with sciatica    Re order Gabapentin adjust dose      Relevant Medications   gabapentin (NEURONTIN) 100 MG capsule    Other Visit Diagnoses    Elevated LFTs          Meds ordered this encounter  Medications  . gabapentin (NEURONTIN) 100 MG capsule    Sig: Take 2 capsules (200 mg total) by mouth at bedtime.    Dispense:  180 capsule    Refill:  3  . ezetimibe (ZETIA) 10 MG tablet    Sig: Take 1 tablet (10 mg total) by mouth daily.    Dispense:  90 tablet    Refill:  3    Forward info to Harlow Asa Alianza, had  been following patient but unable to reach.  Follow up plan: Return in about 6 months (around 12/03/2020) for Follow-up 6 month fasting lab then 1 week later Annual Physical.  Future labs ordered for 11/29/20   Nobie Putnam, [facility] 06/02/2020, 11:15 AM

## 2020-06-03 ENCOUNTER — Other Ambulatory Visit: Payer: Self-pay | Admitting: Family Medicine

## 2020-06-03 DIAGNOSIS — I10 Essential (primary) hypertension: Secondary | ICD-10-CM

## 2020-06-03 DIAGNOSIS — E785 Hyperlipidemia, unspecified: Secondary | ICD-10-CM

## 2020-06-03 DIAGNOSIS — Z0000 Encounter for general adult medical examination without abnormal findings: Secondary | ICD-10-CM

## 2020-06-03 DIAGNOSIS — G8929 Other chronic pain: Secondary | ICD-10-CM

## 2020-06-03 DIAGNOSIS — E1169 Type 2 diabetes mellitus with other specified complication: Secondary | ICD-10-CM

## 2020-06-03 NOTE — Assessment & Plan Note (Signed)
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 START Zetia 10mg  daily, we discussed other option with PCSK9 but she declines today, we will consider in near future, she will check cost. Declines other statin such as Pravastatin due to myopathy  2. Continue ASA 81mg  for primary ASCVD risk reduction 3. Encourage improved lifestyle - low carb/cholesterol, reduce portion size, continue improving regular exercise

## 2020-06-03 NOTE — Assessment & Plan Note (Signed)
Secondary to statin. Failed Simvastatin, Rosuvastatin and intermittent low dosing

## 2020-06-03 NOTE — Assessment & Plan Note (Signed)
Clinically morbid obesity with BMI >36 with comorbid conditions Hypertension, Hyperlipidemia, Pre-Diabetes, Osteoporosis  Goals and strategy for wt loss diet primarily, low carb reduce portions, gradually increasing home exercise plan 

## 2020-06-03 NOTE — Assessment & Plan Note (Signed)
Controlled DM, with A1c 6.3, improved Concern with obesity, HTN, HLD  Plan:  1. Continue Metformin IR 500mg BID - future dose adjust as needed 2. Encourage improved lifestyle - low carb, low sugar diet, reduce portion size, continue improving regular exercise  Continue to reconsider GLP1 therapy 

## 2020-06-03 NOTE — Assessment & Plan Note (Signed)
Re order Gabapentin adjust dose

## 2020-06-29 ENCOUNTER — Ambulatory Visit (INDEPENDENT_AMBULATORY_CARE_PROVIDER_SITE_OTHER): Payer: Medicare HMO | Admitting: Licensed Clinical Social Worker

## 2020-06-29 DIAGNOSIS — E1169 Type 2 diabetes mellitus with other specified complication: Secondary | ICD-10-CM

## 2020-06-29 DIAGNOSIS — I10 Essential (primary) hypertension: Secondary | ICD-10-CM | POA: Diagnosis not present

## 2020-06-29 DIAGNOSIS — F411 Generalized anxiety disorder: Secondary | ICD-10-CM

## 2020-06-29 DIAGNOSIS — E785 Hyperlipidemia, unspecified: Secondary | ICD-10-CM | POA: Diagnosis not present

## 2020-06-29 NOTE — Chronic Care Management (AMB) (Signed)
Chronic Care Management    Clinical Social Work Follow Up Note  06/29/2020 Name: Yvette [name] MRN: [id number] DOB: 1945/11/13  Yvette [name] is a 74 y.o. year old female who is a primary care patient of Olin Hauser, DO. The CCM team was consulted for assistance with Mental Health Counseling and Resources.   Review of patient status, including review of consultants reports, other relevant assessments, and collaboration with appropriate care team members and the patient's provider was performed as part of comprehensive patient evaluation and provision of chronic care management services.    SDOH (Social Determinants of Health) assessments performed: Yes    Outpatient Encounter Medications as of 06/29/2020  Medication Sig Note  . Acetaminophen (TYLENOL 8 HOUR ARTHRITIS PAIN PO) Take by mouth. 2 in am 1 in pm   . albuterol (VENTOLIN HFA) 108 (90 Base) MCG/ACT inhaler Inhale 2 puffs into the lungs every 4 (four) hours as needed for wheezing or shortness of breath (cough).   Marland Kitchen amLODipine (NORVASC) 10 MG tablet Take 1 tablet (10 mg total) by mouth daily.   Marland Kitchen aspirin 81 MG tablet Take 81 mg by mouth daily.   . baclofen (LIORESAL) 10 MG tablet Take 0.5-1 tablets (5-10 mg total) by mouth 2 (two) times daily as needed for muscle spasms.   . beta carotene w/minerals (OCUVITE) tablet Take 1 tablet by mouth daily.   . Biotin 5000 MCG TABS Take 1 tablet by mouth daily.   . Calcium-Phosphorus-Vitamin D (CITRACAL +D3 PO) Take 1 Dose by mouth 2 (two) times daily.   . Cetirizine HCl 10 MG CAPS Take by mouth.   Marland Kitchen Co-Enzyme Q-10 100 MG CAPS Take 1 capsule by mouth daily.   Marland Kitchen ezetimibe (ZETIA) 10 MG tablet Take 1 tablet (10 mg total) by mouth daily.   . fluconazole (DIFLUCAN) 150 MG tablet Take 1 tablet at onset of symptoms, can repeat dose 72 hours later   . fluticasone (FLONASE) 50 MCG/ACT nasal spray Place 2 sprays into both nostrils daily. 11/30/2019: Nightly   . furosemide (LASIX) 20 MG  tablet Take 1-2 tablets (20-40 mg total) by mouth daily as needed for fluid or edema. Recommend use up to 3-5 days at a time if needed.   . gabapentin (NEURONTIN) 100 MG capsule Take 2 capsules (200 mg total) by mouth at bedtime.   . hydrochlorothiazide (HYDRODIURIL) 25 MG tablet Take 1 tablet (25 mg total) by mouth daily.   Javier Docker Oil 300 MG CAPS Take by mouth daily.   Marland Kitchen lisinopril (ZESTRIL) 40 MG tablet Take 1 tablet (40 mg total) by mouth daily.   . Mag Aspart-Potassium Aspart (POTASSIUM & MAGNESIUM ASPARTAT PO) Take 1 tablet by mouth daily.   . metFORMIN (GLUCOPHAGE) 500 MG tablet Take 1 tablet (500 mg total) by mouth 2 (two) times daily with a meal.   . Multiple Vitamin (MULTIVITAMIN) tablet Take 1 tablet by mouth daily.   . mupirocin cream (BACTROBAN) 2 % Apply 1 application topically 2 (two) times daily as needed. To affected area as directed for up to 7-14 days as needed   . naproxen (NAPROSYN) 500 MG tablet Take 1 tablet (500 mg total) by mouth 2 (two) times daily with a meal. For 1-2 weeks then as needed   . pantoprazole (PROTONIX) 40 MG tablet Take 1 tablet (40 mg total) by mouth daily before breakfast. (Patient not taking: Reported on 04/07/2020)    No facility-administered encounter medications on file as of 06/29/2020.  Goals Addressed    .  SW- I need additional support and resource connection. I have little support since my spouse passed 4 years ago" (pt-stated)        Current Barriers:  . Acute Mental Health needs related to Anxiety and ongoing grief symptoms  . Financial constraints related to managing health care expenses and affording bills . Limited social support . Limited access to food . Level of care concerns . ADL IADL limitations . Limited access to caregiver . Inability to perform ADL's independently . Inability to perform IADL's independently . Lacks knowledge of community resource: available food support resources  Clinical Social Work Goal(s):  Marland Kitchen Over the  next 120 days, patient will work with SW bi-monthly by telephone or in person to reduce or manage symptoms related to anxiety . Over the next 120 days, client will work with SW to address concerns related to gaining additional support/resources in order to improve self-care, physical health, emotional health and overall well-being.   Interventions: . Patient interviewed and appropriate assessments performed: brief mental health assessment . Patient interviewed and appropriate assessments performed . Provided patient with information about available financial and food support resources within the area. Patient is agreeable to MOW referral through [facility] contract. LCSW completed referral and asked for meals to be delivered for 60 days while she is on the wait list for services. UPDATE- Patient is no longer receiving meals but is on wait list. Patient reports that she is actually cooking again and does not need meal prep assistance at this time. Patient reports that she has started cooking healthy meals each night for herself. . Discussed plans with patient for ongoing care management follow up and provided patient with direct contact information for care management team . Patient confirms healthy self-care implementation into her routine by eating healthy, drinking more fluids and stayting active. Patient reports that her this has improved her mental health as well. . Patient was provided positive reinforcement for recent self-care and healthy depression management implementation.  . Assisted patient/caregiver with obtaining information about health plan benefits . Provided education and assistance to client regarding Advanced Directives. . Provided education to patient/caregiver regarding level of care options. . 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  Sonia Side confirms stable transportation to all  future medical appointments. Marland Kitchen LCSW spent extensive time educating patient on anxiety management coping skills and health self care . Psychoeducation and/or Health Education provided  Patient Self Care Activities:  . Attends all scheduled provider appointments . Calls provider office for new concerns or questions  . Ongoing isolation within the home (fear of COVID)  Please see past updates related to this goal by clicking on the "Past Updates" button in the selected goal      Follow Up Plan: SW will follow up with patient by phone over the next quarter  [name], [place], MSW, [email or web address] Phone: [phone]

## 2020-07-14 ENCOUNTER — Encounter: Payer: Self-pay | Admitting: Family Medicine

## 2020-07-14 NOTE — Telephone Encounter (Signed)
Copied from [place] [id number]. Topic: General - Other >> Jul 14, 2020 10:44 AM Keene Breath wrote: Reason for CRM: Patient called to speak with the nurse because  she has a question about covid.  CB# [phone]

## 2020-08-15 ENCOUNTER — Ambulatory Visit: Payer: Medicare HMO | Admitting: Licensed Clinical Social Worker

## 2020-08-15 DIAGNOSIS — F411 Generalized anxiety disorder: Secondary | ICD-10-CM

## 2020-08-15 DIAGNOSIS — I10 Essential (primary) hypertension: Secondary | ICD-10-CM

## 2020-08-15 DIAGNOSIS — E1169 Type 2 diabetes mellitus with other specified complication: Secondary | ICD-10-CM

## 2020-08-15 NOTE — Chronic Care Management (AMB) (Signed)
Chronic Care Management    Clinical Social Work Follow Up Note  08/15/2020 Name: Yvette [name] MRN: [id number] DOB: 04/09/1946  Yvette [name] is a 74 y.o. year old female who is a primary care patient of Yvette Hauser, DO. The CCM team was consulted for assistance with Mental Health Counseling and Resources.   Review of patient status, including review of consultants reports, other relevant assessments, and collaboration with appropriate care team members and the patient's provider was performed as part of comprehensive patient evaluation and provision of chronic care management services.    SDOH (Social Determinants of Health) assessments performed: Yes    Outpatient Encounter Medications as of 08/15/2020  Medication Sig Note  . Acetaminophen (TYLENOL 8 HOUR ARTHRITIS PAIN PO) Take by mouth. 2 in am 1 in pm   . albuterol (VENTOLIN HFA) 108 (90 Base) MCG/ACT inhaler Inhale 2 puffs into the lungs every 4 (four) hours as needed for wheezing or shortness of breath (cough).   Marland Kitchen amLODipine (NORVASC) 10 MG tablet Take 1 tablet (10 mg total) by mouth daily.   Marland Kitchen aspirin 81 MG tablet Take 81 mg by mouth daily.   . baclofen (LIORESAL) 10 MG tablet Take 0.5-1 tablets (5-10 mg total) by mouth 2 (two) times daily as needed for muscle spasms.   . beta carotene w/minerals (OCUVITE) tablet Take 1 tablet by mouth daily.   . Biotin 5000 MCG TABS Take 1 tablet by mouth daily.   . Calcium-Phosphorus-Vitamin D (CITRACAL +D3 PO) Take 1 Dose by mouth 2 (two) times daily.   . Cetirizine HCl 10 MG CAPS Take by mouth.   Marland Kitchen Co-Enzyme Q-10 100 MG CAPS Take 1 capsule by mouth daily.   Marland Kitchen ezetimibe (ZETIA) 10 MG tablet Take 1 tablet (10 mg total) by mouth daily.   . fluconazole (DIFLUCAN) 150 MG tablet Take 1 tablet at onset of symptoms, can repeat dose 72 hours later   . fluticasone (FLONASE) 50 MCG/ACT nasal spray Place 2 sprays into both nostrils daily. 11/30/2019: Nightly   . furosemide (LASIX) 20 MG  tablet Take 1-2 tablets (20-40 mg total) by mouth daily as needed for fluid or edema. Recommend use up to 3-5 days at a time if needed.   . gabapentin (NEURONTIN) 100 MG capsule Take 2 capsules (200 mg total) by mouth at bedtime.   . hydrochlorothiazide (HYDRODIURIL) 25 MG tablet Take 1 tablet (25 mg total) by mouth daily.   Javier Docker Oil 300 MG CAPS Take by mouth daily.   Marland Kitchen lisinopril (ZESTRIL) 40 MG tablet Take 1 tablet (40 mg total) by mouth daily.   . Mag Aspart-Potassium Aspart (POTASSIUM & MAGNESIUM ASPARTAT PO) Take 1 tablet by mouth daily.   . metFORMIN (GLUCOPHAGE) 500 MG tablet Take 1 tablet (500 mg total) by mouth 2 (two) times daily with a meal.   . Multiple Vitamin (MULTIVITAMIN) tablet Take 1 tablet by mouth daily.   . mupirocin cream (BACTROBAN) 2 % Apply 1 application topically 2 (two) times daily as needed. To affected area as directed for up to 7-14 days as needed   . naproxen (NAPROSYN) 500 MG tablet Take 1 tablet (500 mg total) by mouth 2 (two) times daily with a meal. For 1-2 weeks then as needed   . pantoprazole (PROTONIX) 40 MG tablet Take 1 tablet (40 mg total) by mouth daily before breakfast. (Patient not taking: Reported on 04/07/2020)    No facility-administered encounter medications on file as of 08/15/2020.  Goals Addressed    .  SW- I need additional support and resource connection. I have little support since my spouse passed 4 years ago" (pt-stated)        Current Barriers:  . Acute Mental Health needs related to Anxiety and ongoing grief symptoms  . Financial constraints related to managing health care expenses and affording bills . Limited social support . Limited access to food . Level of care concerns . ADL IADL limitations . Limited access to caregiver . Inability to perform ADL's independently . Inability to perform IADL's independently . Lacks knowledge of community resource: available food support resources  Clinical Social Work Goal(s):  Marland Kitchen Over the  next 120 days, patient will work with SW bi-monthly by telephone or in person to reduce or manage symptoms related to anxiety . Over the next 120 days, client will work with SW to address concerns related to gaining additional support/resources in order to improve self-care, physical health, emotional health and overall well-being.   Interventions: . Patient interviewed and appropriate assessments performed: brief mental health assessment . Patient just got back from a weekend trip with her son in law and daughter. She shares that she is implementing socialization into her daily routine as much as possible. She shares that she has a group of friends that she meets with 2 x per week to eat and socialize. Patient shares that her church has become more active since [place] and she has started participating in their services again.  . Provided patient with information about available financial and food support resources within the area. Patient is agreeable to MOW referral through [facility] contract. LCSW completed referral and asked for meals to be delivered for 60 days while she is on the wait list for services. UPDATE- Patient is no longer receiving meals as she declined being in need of this service anymore. Patient reports that she is actually cooking again and does not need meal prep assistance at this time. Patient reports that she has started cooking healthy meals each night for herself. . 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  . Discussed plans with patient for ongoing care management follow up and provided patient with direct contact information for care management team . Patient confirms healthy self-care implementation into her routine by eating healthy, drinking more fluids and stayting active. Patient reports that her this has improved her mental health as well. . Patient was provided positive reinforcement for recent self-care and healthy depression  management implementation.  . Assisted patient/caregiver with obtaining information about health plan benefits . Provided education and assistance to client regarding Advanced Directives. . Provided education to patient/caregiver regarding level of care options. . 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  . Patient shares that she has been managing her grief well. She states that a dear friend of hers lost their daughter and she was able to be a positive support for friend without it triggering her own grief (loss of spouse.)  . Dannya confirms stable transportation to all future medical appointments. . Patient reports that the recent cholesterol medication that PCP put her on has started causing her leg weakness. She said that this very thing happened when she has tried other cholesterol medication. LCSW will update PCP.  Marland Kitchen LCSW spent extensive time educating patient on anxiety management coping skills and health self care . Psychoeducation and/or Health Education  provided  Patient Self Care Activities:  . Attends all scheduled provider appointments . Calls provider office for new concerns or questions .  Please see past updates related to this goal by clicking on the "Past Updates" button in the selected goal       Follow Up Plan: SW will follow up with patient by phone over the next quarter  [name], [place], MSW, [email or web address] Phone: [phone]

## 2020-08-30 ENCOUNTER — Other Ambulatory Visit: Payer: Self-pay | Admitting: Family Medicine

## 2020-08-30 DIAGNOSIS — M47816 Spondylosis without myelopathy or radiculopathy, lumbar region: Secondary | ICD-10-CM

## 2020-08-30 DIAGNOSIS — K219 Gastro-esophageal reflux disease without esophagitis: Secondary | ICD-10-CM

## 2020-08-30 DIAGNOSIS — M5441 Lumbago with sciatica, right side: Secondary | ICD-10-CM

## 2020-08-30 DIAGNOSIS — G8929 Other chronic pain: Secondary | ICD-10-CM

## 2020-09-18 ENCOUNTER — Other Ambulatory Visit: Payer: Self-pay | Admitting: Family Medicine

## 2020-09-18 DIAGNOSIS — I10 Essential (primary) hypertension: Secondary | ICD-10-CM

## 2020-09-18 NOTE — Telephone Encounter (Signed)
Requested Prescriptions  Pending Prescriptions Disp Refills  . lisinopril (ZESTRIL) 40 MG tablet [Pharmacy Med Name: LISINOPRIL 40 MG TABLET] 90 tablet 0    Sig: Take 1 tablet (40 mg total) by mouth daily.     Cardiovascular:  ACE Inhibitors Passed - 09/18/2020 10:33 AM      Passed - Cr in normal range and within 180 days    Creat  Date Value Ref Range Status  05/26/2020 0.69 0.60 - 0.93 mg/dL Final    Comment:    For patients >76 years of age, the reference limit for Creatinine is approximately 13% higher for people identified as African-American. .          Passed - K in normal range and within 180 days    Potassium  Date Value Ref Range Status  05/26/2020 4.5 3.5 - 5.3 mmol/L Final  11/20/2011 4.1 3.5 - 5.1 mmol/L Final         Passed - Patient is not pregnant      Passed - Last BP in normal range    BP Readings from Last 1 Encounters:  06/02/20 129/65         Passed - Valid encounter within last 6 months    Recent Outpatient Visits          3 months ago Type 2 diabetes mellitus with other specified complication, without long-term current use of insulin [facility])   [place], DO   5 months ago Abscess   [facility] [place], Lupita Raider, FNP   8 months ago Spondylosis of lumbar region without myelopathy or radiculopathy   [place], DO   9 months ago Annual physical exam   [place], DO   9 months ago Venous insufficiency of both lower extremities   [place], DO      Future Appointments            In 2 months [place], Devonne Doughty, [facility], [facility]

## 2020-10-06 ENCOUNTER — Other Ambulatory Visit: Payer: Self-pay | Admitting: Family Medicine

## 2020-10-06 DIAGNOSIS — I10 Essential (primary) hypertension: Secondary | ICD-10-CM

## 2020-10-12 ENCOUNTER — Other Ambulatory Visit: Payer: Self-pay | Admitting: Family Medicine

## 2020-10-12 DIAGNOSIS — K219 Gastro-esophageal reflux disease without esophagitis: Secondary | ICD-10-CM

## 2020-10-17 ENCOUNTER — Ambulatory Visit: Payer: Medicare HMO | Admitting: Licensed Clinical Social Worker

## 2020-10-17 DIAGNOSIS — F411 Generalized anxiety disorder: Secondary | ICD-10-CM

## 2020-10-17 DIAGNOSIS — E785 Hyperlipidemia, unspecified: Secondary | ICD-10-CM

## 2020-10-17 DIAGNOSIS — E1169 Type 2 diabetes mellitus with other specified complication: Secondary | ICD-10-CM

## 2020-10-17 DIAGNOSIS — I10 Essential (primary) hypertension: Secondary | ICD-10-CM

## 2020-10-17 NOTE — Chronic Care Management (AMB) (Signed)
Chronic Care Management    Clinical Social Work Note  10/17/2020 Name: Yvette [name] MRN: [id number] DOB: [birth year]  Yvette [name] is a 75 y.o. year old female who is a primary care patient of Olin Hauser, DO. The CCM team was consulted to assist the patient with chronic disease management and/or care coordination needs related to: Mental Health Counseling and Resources and Grief Counseling.   Engaged with patient by telephone for follow up visit in response to provider referral for social work chronic care management and care coordination services.   Consent to Services:  The patient was given the following information about Chronic Care Management services today, agreed to services, and gave verbal consent: 1. CCM service includes personalized support from designated clinical staff supervised by the primary care provider, including individualized plan of care and coordination with other care providers 2. 24/7 contact phone numbers for assistance for urgent and routine care needs. 3. Service will only be billed when office clinical staff spend 20 minutes or more in a month to coordinate care. 4. Only one practitioner may furnish and bill the service in a calendar month. 5.The patient may stop CCM services at any time (effective at the end of the month) by phone call to the office staff. 6. The patient will be responsible for cost sharing (co-pay) of up to 20% of the service fee (after annual deductible is met). Patient agreed to services and consent obtained.  Patient agreed to services and consent obtained.   Assessment/Interventions: Review of patient past medical history, allergies, medications, and health status, including review of relevant consultants reports was performed today as part of a comprehensive evaluation and provision of chronic care management and care coordination services.     SDOH (Social Determinants of Health) assessments and interventions performed:     Advanced Directives Status: See Care Plan for related entries.  CCM Care Plan  Allergies  Allergen Reactions  . Simvastatin Other (See Comments)    Myalgia, muscle aches lower extremity  . Erythromycin Nausea And Vomiting    Outpatient Encounter Medications as of 10/17/2020  Medication Sig Note  . Acetaminophen (TYLENOL 8 HOUR ARTHRITIS PAIN PO) Take by mouth. 2 in am 1 in pm   . albuterol (VENTOLIN HFA) 108 (90 Base) MCG/ACT inhaler Inhale 2 puffs into the lungs every 4 (four) hours as needed for wheezing or shortness of breath (cough).   Marland Kitchen amLODipine (NORVASC) 10 MG tablet Take 1 tablet (10 mg total) by mouth daily.   Marland Kitchen aspirin 81 MG tablet Take 81 mg by mouth daily.   . baclofen (LIORESAL) 10 MG tablet Take 0.5-1 tablets (5-10 mg total) by mouth 2 (two) times daily as needed for muscle spasms.   . beta carotene w/minerals (OCUVITE) tablet Take 1 tablet by mouth daily.   . Biotin 5000 MCG TABS Take 1 tablet by mouth daily.   . Calcium-Phosphorus-Vitamin D (CITRACAL +D3 PO) Take 1 Dose by mouth 2 (two) times daily.   . Cetirizine HCl 10 MG CAPS Take by mouth.   Marland Kitchen Co-Enzyme Q-10 100 MG CAPS Take 1 capsule by mouth daily.   Marland Kitchen ezetimibe (ZETIA) 10 MG tablet Take 1 tablet (10 mg total) by mouth daily.   . fluconazole (DIFLUCAN) 150 MG tablet Take 1 tablet at onset of symptoms, can repeat dose 72 hours later   . fluticasone (FLONASE) 50 MCG/ACT nasal spray Place 2 sprays into both nostrils daily. 11/30/2019: Nightly   . furosemide (LASIX) 20 MG tablet  Take 1-2 tablets (20-40 mg total) by mouth daily as needed for fluid or edema. Recommend use up to 3-5 days at a time if needed.   . gabapentin (NEURONTIN) 100 MG capsule Take 2 capsules (200 mg total) by mouth at bedtime.   . hydrochlorothiazide (HYDRODIURIL) 25 MG tablet Take 1 tablet (25 mg total) by mouth daily.   Javier Docker Oil 300 MG CAPS Take by mouth daily.   Marland Kitchen lisinopril (ZESTRIL) 40 MG tablet Take 1 tablet (40 mg total) by mouth  daily.   . Mag Aspart-Potassium Aspart (POTASSIUM & MAGNESIUM ASPARTAT PO) Take 1 tablet by mouth daily.   . metFORMIN (GLUCOPHAGE) 500 MG tablet Take 1 tablet (500 mg total) by mouth 2 (two) times daily with a meal.   . Multiple Vitamin (MULTIVITAMIN) tablet Take 1 tablet by mouth daily.   . mupirocin cream (BACTROBAN) 2 % Apply 1 application topically 2 (two) times daily as needed. To affected area as directed for up to 7-14 days as needed   . naproxen (NAPROSYN) 500 MG tablet Take 1 tablet (500 mg total) by mouth 2 (two) times daily with a meal. For 1-2 weeks then as needed   . pantoprazole (PROTONIX) 40 MG tablet Take 1 tablet (40 mg total) by mouth daily before breakfast.    No facility-administered encounter medications on file as of 10/17/2020.    Patient Active Problem List   Diagnosis Date Noted  . 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 Abscess 04/07/2020  . Venous insufficiency of both lower extremities 12/01/2019  . Suspected sleep apnea 07/13/2018  . Former smoker 07/13/2018  . Multiple renal cysts 11/12/2017  . Vitamin D deficiency 10/15/2017  . Spondylosis of lumbar region without myelopathy or radiculopathy 06/10/2017  . Chronic right-sided low back pain with sciatica 06/10/2017  . Primary osteoarthritis involving multiple joints 06/10/2017  . BPPV (benign paroxysmal positional vertigo), right 06/10/2017  . Nausea without vomiting 06/10/2017  . Environmental and seasonal allergies 02/12/2017  . 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/2017  . Morbid obesity ([place]) 02/12/2017  . 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  . Dupuytren's contracture of left hand 10/14/2016  . Anxiety disorder 09/03/2016  . GERD (gastroesophageal reflux disease) 09/03/2016  . Prophylactic use of raloxifene ([facility]) 09/03/2016  . Hyperlipidemia associated with type 2 diabetes mellitus ([place]) 09/03/2016  . Type 2 diabetes mellitus with other specified complication ([place]) 11/94/1740  . Dyspnea 07/23/2013  . Essential  hypertension     Conditions to be addressed/monitored: Anxiety and Depression; Limited social support, ADL IADL limitations, Mental Health Concerns , Social Isolation and Limited access to caregiver  Patient Care Plan: General Social Work (Adult)    Problem Identified: Coping Skills (General Plan of Care)     Goal: Coping Skills Enhanced   Start Date: 10/17/2020  This Visit's Progress: On track  Note:   Evidence-based guidance:   Acknowledge, normalize and validate difficulty of making life-long lifestyle changes.   Identify current effective and ineffective coping strategies.   Encourage patient and caregiver participation in care to increase self-esteem, confidence and feelings of control.   Consider alternative and complementary therapy approaches such as meditation, mindfulness or yoga.   Encourage participation in cognitive behavioral therapy to foster a positive identity, increase self-awareness, as well as bolster self-esteem, confidence and self-efficacy.   Discuss spirituality; be present as concerns are identified; encourage journaling, prayer, worship services, meditation or pastoral counseling.   Encourage participation in pleasurable group activities such as hobbies, singing, sports or volunteering).   Encourage the use  of mindfulness; refer for training or intensive intervention.   Consider the use of meditative movement therapy such as tai chi, yoga or qigong.   Promote a regular daily exercise program based on tolerance, ability and patient choice to support positive thinking about disease or aging.   Notes:   Current Barriers:  . Acute Mental Health needs related to Anxiety and ongoing grief symptoms  . Financial constraints related to managing health care expenses and affording bills . Limited social support . Limited access to food . Level of care concerns . ADL IADL limitations . Limited access to caregiver . Inability to perform ADL's  independently . Inability to perform IADL's independently . Lacks knowledge of community resource: available food support resources  Clinical Social Work Goal(s):  Marland Kitchen Over the next 120 days, patient will work with SW bi-monthly by telephone or in person to reduce or manage symptoms related to anxiety . Over the next 120 days, client will work with SW to address concerns related to gaining additional support/resources in order to improve self-care, physical health, emotional health and overall well-being.   Interventions: . Patient interviewed and appropriate assessments performed: brief mental health assessment . Patient just got back from a weekend trip with her son in law and daughter. She shares that she is implementing socialization into her daily routine as much as possible. She shares that she has a group of friends that she meets with 2 x per week to eat and socialize.  . Patient reports ongoing grief but DENIED wanting CCM LCSW to place grief therapy referral today on 10/17/20 . Provided patient with information about available financial and food support resources within the area. Patient is agreeable to MOW referral through [facility] contract. LCSW completed referral and asked for meals to be delivered for 60 days while she is on the wait list for services. UPDATE- Patient is no longer receiving meals as she declined being in need of this service anymore. Patient reports that she is actually cooking again and does not need meal prep assistance at this time. Patient reports that she has started cooking healthy meals each night for herself. . 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  . Discussed plans with patient for ongoing care management follow up and provided patient with direct contact information for care management team . Patient confirms healthy self-care implementation into her routine by eating healthy, drinking more fluids and stayting active.  Patient reports that her this has improved her mental health as well. . Patient was provided positive reinforcement for recent self-care and healthy depression management implementation.  . Assisted patient/caregiver with obtaining information about health plan benefits . Provided education and assistance to client regarding Advanced Directives. . Provided education to patient/caregiver regarding level of care options. . 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  . Patient reports that she is no longer able to get as much physical activity because her church is not allowing members to talk there like the use to. Patient shares that this was a great opportunity to socialize when she walked the church 23 laps as it equaled 1 mile.  . Patient shares that she has been managing her grief well. She states that a dear friend of hers lost their daughter and she was able to be a positive support for friend without it triggering her own grief (loss of spouse.)  .  Yvette [name] confirms stable transportation to all future medical appointments. . Patient reports that the recent cholesterol medication that PCP put her on has started causing her leg weakness. She said that this very thing happened when she has tried other cholesterol medication. LCSW will update PCP.  Marland Kitchen LCSW spent extensive time educating patient on anxiety management coping skills and health self care . Psychoeducation and/or Health Education provided  Patient Self Care Activities:  . Attends all scheduled provider appointments . Calls provider office for new concerns or questions .  Please see past updates related to this goal by clicking on the "Past Updates" button in the selected goal    Task: Support Psychosocial Response to Risk or Actual Health Condition   Note:   Care Management Activities:    - active listening utilized - counseling  provided - current coping strategies identified - decision-making supported - healthy lifestyle promoted - journaling promoted - meditative movement therapy encouraged - mindfulness encouraged - participation in counseling encouraged - problem-solving facilitated - relaxation techniques promoted - self-reflection promoted - spiritual activities promoted - verbalization of feelings encouraged    Notes:       Follow Up Plan: SW will follow up with patient by phone over the next quarter      [name], [place], MSW, [email or web address] Phone: [phone]

## 2020-11-08 ENCOUNTER — Other Ambulatory Visit: Payer: Self-pay

## 2020-11-08 ENCOUNTER — Ambulatory Visit
Admission: EM | Admit: 2020-11-08 | Discharge: 2020-11-08 | Disposition: A | Discharge status: Home / Self Care | Payer: Medicare HMO | Attending: Emergency Medicine | Admitting: Emergency Medicine

## 2020-11-08 DIAGNOSIS — L723 Sebaceous cyst: Secondary | ICD-10-CM

## 2020-11-08 HISTORY — DX: Unspecified osteoarthritis, unspecified site: M19.90

## 2020-11-08 MED ORDER — SULFAMETHOXAZOLE-TRIMETHOPRIM 800-160 MG PO TABS: 1.0000 | ORAL_TABLET | Freq: Two times a day (BID) | ORAL | 0 refills | Status: AC

## 2020-11-08 NOTE — ED Triage Notes (Addendum)
Pt has c/o possible abscess/cyst on her upper back that has been increasing over the past 2 weeks. She has had a cyst removed from this site previously. She states the area does seem to be draining some. She has tried to apply hydrogen peroxide to the area. The area is red and swollen and appears to have possibly 2 areas of infection.

## 2020-11-08 NOTE — Discharge Instructions (Addendum)
The Bactrim twice daily with full glass of water for 10 days for treatment of the infected cyst.  Call and schedule appointment tomorrow with dermatology for evaluation and removal.  Continue to apply warm compresses to the area as this may help to facilitate drainage.

## 2020-11-08 NOTE — ED Provider Notes (Signed)
[facility]    CSN: [id number] Arrival date & time: 11/08/20  1656      History   Chief Complaint Chief Complaint  Patient presents with  . Cyst    HPI Yvette [name] is a 75 y.o. female.   HPI   75 year old female here for evaluation of a cyst on her back on the right.  Patient reports that it has been there for the last 1.5 weeks.  It started off with itching and then it became painful and now every once in a while it will weep.  Patient denies fever.  Patient has had 2 sebaceous cysts in the same location removed at 2 different periods.  One was 5 years ago and the other one was a year ago.  Both were removed by dermatology.  Past Medical History:  Diagnosis Date  . Arthropathy, unspecified, site unspecified   . Esophageal reflux   . Osteoarthritis   . Osteoporosis, unspecified   . Other abnormal glucose   . Other and unspecified hyperlipidemia   . Symptomatic menopausal or female climacteric states   . Syncope and collapse     Patient Active Problem List   Diagnosis Date Noted  . 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 Abscess 04/07/2020  . Venous insufficiency of both lower extremities 12/01/2019  . Suspected sleep apnea 07/13/2018  . Former smoker 07/13/2018  . Multiple renal cysts 11/12/2017  . Vitamin D deficiency 10/15/2017  . Spondylosis of lumbar region without myelopathy or radiculopathy 06/10/2017  . Chronic right-sided low back pain with sciatica 06/10/2017  . Primary osteoarthritis involving multiple joints 06/10/2017  . BPPV (benign paroxysmal positional vertigo), right 06/10/2017  . Nausea without vomiting 06/10/2017  . Environmental and seasonal allergies 02/12/2017  . 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/2017  . Morbid obesity ([place]) 02/12/2017  . 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  . Dupuytren's contracture of left hand 10/14/2016  . Anxiety disorder 09/03/2016  . GERD (gastroesophageal reflux disease) 09/03/2016  . Prophylactic use of  raloxifene ([facility]) 09/03/2016  . Hyperlipidemia associated with type 2 diabetes mellitus ([place]) 09/03/2016  . Type 2 diabetes mellitus with other specified complication ([place]) 0000000  . Dyspnea 07/23/2013  . Essential hypertension     Past Surgical History:  Procedure Laterality Date  . ABDOMINAL HYSTERECTOMY    . APPENDECTOMY    . BREAST BIOPSY Left 1970's   neg  . COLONOSCOPY    . FINGER FRACTURE SURGERY      OB History    Gravida  2   Para      Term      Preterm      AB      Living  2     SAB      IAB      Ectopic      Multiple      Live Births               Home Medications    Prior to Admission medications   Medication Sig Start Date End Date Taking? Authorizing Provider  Acetaminophen (TYLENOL 8 HOUR ARTHRITIS PAIN PO) Take by mouth. 2 in am 1 in pm   Yes [provider]  albuterol (VENTOLIN HFA) 108 (90 Base) MCG/ACT inhaler Inhale 2 puffs into the lungs every 4 (four) hours as needed for wheezing or shortness of breath (cough). 08/30/19  Yes Karamalegos, Devonne Doughty, DO  amLODipine (NORVASC) 10 MG tablet Take 1 tablet (10 mg total) by mouth daily. 05/28/19  Yes Nobie Putnam  J, DO  aspirin 81 MG tablet Take 81 mg by mouth daily.   Yes [provider]  baclofen (LIORESAL) 10 MG tablet Take 0.5-1 tablets (5-10 mg total) by mouth 2 (two) times daily as needed for muscle spasms. 01/11/20  Yes Karamalegos, Devonne Doughty, DO  beta carotene w/minerals (OCUVITE) tablet Take 1 tablet by mouth daily.   Yes [provider]  Biotin 5000 MCG TABS Take 1 tablet by mouth daily.   Yes [provider]  Calcium-Phosphorus-Vitamin D (CITRACAL +D3 PO) Take 1 Dose by mouth 2 (two) times daily.   Yes [provider]  Cetirizine HCl 10 MG CAPS Take by mouth.   Yes [provider]  Co-Enzyme Q-10 100 MG CAPS Take 1 capsule by mouth daily.   Yes [provider]  fluticasone (FLONASE) 50 MCG/ACT nasal  spray Place 2 sprays into both nostrils daily. 07/13/18  Yes Karamalegos, Devonne Doughty, DO  furosemide (LASIX) 20 MG tablet Take 1-2 tablets (20-40 mg total) by mouth daily as needed for fluid or edema. Recommend use up to 3-5 days at a time if needed. 12/01/19  Yes Karamalegos, Devonne Doughty, DO  gabapentin (NEURONTIN) 100 MG capsule Take 2 capsules (200 mg total) by mouth at bedtime. 06/02/20  Yes Karamalegos, Devonne Doughty, DO  hydrochlorothiazide (HYDRODIURIL) 25 MG tablet Take 1 tablet (25 mg total) by mouth daily. 08/30/19  Yes Karamalegos, Devonne Doughty, DO  Krill Oil 300 MG CAPS Take by mouth daily.   Yes [provider]  lisinopril (ZESTRIL) 40 MG tablet Take 1 tablet (40 mg total) by mouth daily. 10/09/20  Yes Karamalegos, Devonne Doughty, DO  Mag Aspart-Potassium Aspart (POTASSIUM & MAGNESIUM ASPARTAT PO) Take 1 tablet by mouth daily.   Yes [provider]  metFORMIN (GLUCOPHAGE) 500 MG tablet Take 1 tablet (500 mg total) by mouth 2 (two) times daily with a meal. 12/02/19  Yes Karamalegos, Devonne Doughty, DO  Multiple Vitamin (MULTIVITAMIN) tablet Take 1 tablet by mouth daily.   Yes [provider]  mupirocin cream (BACTROBAN) 2 % Apply 1 application topically 2 (two) times daily as needed. To affected area as directed for up to 7-14 days as needed 12/01/19  Yes Karamalegos, Devonne Doughty, DO  naproxen (NAPROSYN) 500 MG tablet Take 1 tablet (500 mg total) by mouth 2 (two) times daily with a meal. For 1-2 weeks then as needed 08/30/20  Yes Karamalegos, Devonne Doughty, DO  pantoprazole (PROTONIX) 40 MG tablet Take 1 tablet (40 mg total) by mouth daily before breakfast. 08/30/20  Yes Karamalegos, Devonne Doughty, DO  sulfamethoxazole-trimethoprim (BACTRIM DS) 800-160 MG tablet Take 1 tablet by mouth 2 (two) times daily for 10 days. 11/08/20 11/18/20 Yes Margarette Canada, NP  ezetimibe (ZETIA) 10 MG tablet Take 1 tablet (10 mg total) by mouth daily. 06/02/20   Olin Hauser, DO    Family  History Family History  Problem Relation Age of Onset  . Diabetes Mother   . Heart disease Mother   . Heart attack Mother   . Ulcers Father   . Gallstones Father   . Heart attack Brother     Social History Social History   Tobacco Use  . Smoking status: Former Smoker    Packs/day: 1.00    Years: 26.00    Pack years: 26.00    Types: Cigarettes    Quit date: 06/07/1992    Years since quitting: 28.4  . Smokeless tobacco: Former Network engineer  . Vaping Use: Never  used  Substance Use Topics  . Alcohol use: Not Currently    Alcohol/week: 0.0 standard drinks    Comment: occassional  . Drug use: No     Allergies   Simvastatin and Erythromycin   Review of Systems Review of Systems  Constitutional: Negative for activity change and appetite change.  Skin: Positive for color change and wound.  Hematological: Negative.   Psychiatric/Behavioral: Negative.      Physical Exam Triage Vital Signs ED Triage Vitals  Enc Vitals Group     BP 11/08/20 1734 (!) 170/75     Pulse Rate 11/08/20 1734 69     Resp 11/08/20 1734 18     Temp 11/08/20 1734 98.3 F (36.8 C)     Temp Source 11/08/20 1734 Oral     SpO2 11/08/20 1734 98 %     Weight 11/08/20 1729 220 lb (99.8 kg)     Height 11/08/20 1729 5\' 7"  (1.702 m)     Head Circumference --      Peak Flow --      Pain Score 11/08/20 1728 3     Pain Loc --      Pain Edu? --      Excl. in [place]? --    No data found.  Updated Vital Signs BP (!) 170/75 (BP Location: Left Arm)   Pulse 69   Temp 98.3 F (36.8 C) (Oral)   Resp 18   Ht 5\' 7"  (1.702 m)   Wt 220 lb (99.8 kg)   SpO2 98%   BMI 34.46 kg/m   Visual Acuity Right Eye Distance:   Left Eye Distance:   Bilateral Distance:    Right Eye Near:   Left Eye Near:    Bilateral Near:     Physical Exam Vitals and nursing note reviewed.  Constitutional:      General: She is not in acute distress.    Appearance: Normal appearance.  HENT:     Head: Normocephalic and  atraumatic.  Musculoskeletal:        General: Tenderness present.  Skin:    General: Skin is warm and dry.     Capillary Refill: Capillary refill takes less than 2 seconds.     Findings: Lesion present.  Neurological:     General: No focal deficit present.     Mental Status: She is alert and oriented to person, place, and time.  Psychiatric:        Mood and Affect: Mood normal.        Behavior: Behavior normal.        Thought Content: Thought content normal.        Judgment: Judgment normal.      UC Treatments / Results  Labs (all labs ordered are listed, but only abnormal results are displayed) Labs Reviewed - No data to display  EKG   Radiology No results found.  Procedures Procedures (including critical care time)  Medications Ordered in UC Medications - No data to display  Initial Impression / Assessment and Plan / UC Course  I have reviewed the triage vital signs and the nursing notes.  Pertinent labs & imaging results that were available during my care of the patient were reviewed by me and considered in my medical decision making (see chart for details).   For evaluation of a cyst on her back and spine the last week and a half.  Physical exam reveals a 2 cm x 2 cm raised area of redness to the  right of midline at the level of T2.  There is fluctuance but no induration.  The lesion is draining a yellow pus.  Suspect lesion is an infected sebaceous cyst.  Will place patient on Bactrim twice daily for 10 days and have her follow-up with dermatology for removal.   Final Clinical Impressions(s) / UC Diagnoses   Final diagnoses:  Sebaceous cyst     Discharge Instructions     The Bactrim twice daily with full glass of water for 10 days for treatment of the infected cyst.  Call and schedule appointment tomorrow with dermatology for evaluation and removal.  Continue to apply warm compresses to the area as this may help to facilitate drainage.    ED  Prescriptions    Medication Sig Dispense Auth. Provider   sulfamethoxazole-trimethoprim (BACTRIM DS) 800-160 MG tablet Take 1 tablet by mouth 2 (two) times daily for 10 days. 20 tablet Margarette Canada, NP     PDMP not reviewed this encounter.   Margarette Canada, NP 11/08/20 1807

## 2020-11-14 ENCOUNTER — Ambulatory Visit: Payer: Medicare HMO | Admitting: Unknown Physician Specialty

## 2020-11-16 ENCOUNTER — Telehealth: Payer: Medicare HMO

## 2020-11-22 ENCOUNTER — Ambulatory Visit (INDEPENDENT_AMBULATORY_CARE_PROVIDER_SITE_OTHER): Payer: Medicare HMO | Admitting: Licensed Clinical Social Worker

## 2020-11-22 DIAGNOSIS — E785 Hyperlipidemia, unspecified: Secondary | ICD-10-CM | POA: Diagnosis not present

## 2020-11-22 DIAGNOSIS — E1169 Type 2 diabetes mellitus with other specified complication: Secondary | ICD-10-CM | POA: Diagnosis not present

## 2020-11-22 DIAGNOSIS — I10 Essential (primary) hypertension: Secondary | ICD-10-CM | POA: Diagnosis not present

## 2020-11-22 DIAGNOSIS — F411 Generalized anxiety disorder: Secondary | ICD-10-CM

## 2020-11-22 NOTE — Chronic Care Management (AMB) (Addendum)
Chronic Care Management    Clinical Social Work Note  11/22/2020 Name: AUDIANNA LANDGREN MRN: [id number] DOB: [birth year]  ANARELY NICHOLLS is a 75 y.o. year old female who is a primary care patient of Olin Hauser, DO. The CCM team was consulted to assist the patient with chronic disease management and/or care coordination needs related to: Mental Health Counseling and Resources.   Engaged with patient by telephone for follow up visit in response to provider referral for social work chronic care management and care coordination services.   Consent to Services:  The patient was given the following information about Chronic Care Management services today, agreed to services, and gave verbal consent: 1. CCM service includes personalized support from designated clinical staff supervised by the primary care provider, including individualized plan of care and coordination with other care providers 2. 24/7 contact phone numbers for assistance for urgent and routine care needs. 3. Service will only be billed when office clinical staff spend 20 minutes or more in a month to coordinate care. 4. Only one practitioner may furnish and bill the service in a calendar month. 5.The patient may stop CCM services at any time (effective at the end of the month) by phone call to the office staff. 6. The patient will be responsible for cost sharing (co-pay) of up to 20% of the service fee (after annual deductible is met). Patient agreed to services and consent obtained.  Patient agreed to services and consent obtained.   Assessment: Review of patient past medical history, allergies, medications, and health status, including review of relevant consultants reports was performed today as part of a comprehensive evaluation and provision of chronic care management and care coordination services.     SDOH (Social Determinants of Health) assessments and interventions performed:    Advanced Directives Status: See Care  Plan for related entries.  CCM Care Plan  Allergies  Allergen Reactions  . Simvastatin Other (See Comments)    Myalgia, muscle aches lower extremity  . Erythromycin Nausea And Vomiting    Outpatient Encounter Medications as of 11/22/2020  Medication Sig Note  . Acetaminophen (TYLENOL 8 HOUR ARTHRITIS PAIN PO) Take by mouth. 2 in am 1 in pm   . albuterol (VENTOLIN HFA) 108 (90 Base) MCG/ACT inhaler Inhale 2 puffs into the lungs every 4 (four) hours as needed for wheezing or shortness of breath (cough).   Marland Kitchen amLODipine (NORVASC) 10 MG tablet Take 1 tablet (10 mg total) by mouth daily.   Marland Kitchen aspirin 81 MG tablet Take 81 mg by mouth daily.   . baclofen (LIORESAL) 10 MG tablet Take 0.5-1 tablets (5-10 mg total) by mouth 2 (two) times daily as needed for muscle spasms.   . beta carotene w/minerals (OCUVITE) tablet Take 1 tablet by mouth daily.   . Biotin 5000 MCG TABS Take 1 tablet by mouth daily.   . Calcium-Phosphorus-Vitamin D (CITRACAL +D3 PO) Take 1 Dose by mouth 2 (two) times daily.   . Cetirizine HCl 10 MG CAPS Take by mouth.   Marland Kitchen Co-Enzyme Q-10 100 MG CAPS Take 1 capsule by mouth daily.   Marland Kitchen ezetimibe (ZETIA) 10 MG tablet Take 1 tablet (10 mg total) by mouth daily.   . fluticasone (FLONASE) 50 MCG/ACT nasal spray Place 2 sprays into both nostrils daily. 11/30/2019: Nightly   . furosemide (LASIX) 20 MG tablet Take 1-2 tablets (20-40 mg total) by mouth daily as needed for fluid or edema. Recommend use up to 3-5 days at a time  if needed.   . gabapentin (NEURONTIN) 100 MG capsule Take 2 capsules (200 mg total) by mouth at bedtime.   . hydrochlorothiazide (HYDRODIURIL) 25 MG tablet Take 1 tablet (25 mg total) by mouth daily.   Javier Docker Oil 300 MG CAPS Take by mouth daily.   Marland Kitchen lisinopril (ZESTRIL) 40 MG tablet Take 1 tablet (40 mg total) by mouth daily.   . Mag Aspart-Potassium Aspart (POTASSIUM & MAGNESIUM ASPARTAT PO) Take 1 tablet by mouth daily.   . metFORMIN (GLUCOPHAGE) 500 MG tablet Take 1  tablet (500 mg total) by mouth 2 (two) times daily with a meal.   . Multiple Vitamin (MULTIVITAMIN) tablet Take 1 tablet by mouth daily.   . mupirocin cream (BACTROBAN) 2 % Apply 1 application topically 2 (two) times daily as needed. To affected area as directed for up to 7-14 days as needed   . naproxen (NAPROSYN) 500 MG tablet Take 1 tablet (500 mg total) by mouth 2 (two) times daily with a meal. For 1-2 weeks then as needed   . pantoprazole (PROTONIX) 40 MG tablet Take 1 tablet (40 mg total) by mouth daily before breakfast.    No facility-administered encounter medications on file as of 11/22/2020.    Patient Active Problem List   Diagnosis Date Noted  . 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 Abscess 04/07/2020  . Venous insufficiency of both lower extremities 12/01/2019  . Suspected sleep apnea 07/13/2018  . Former smoker 07/13/2018  . Multiple renal cysts 11/12/2017  . Vitamin D deficiency 10/15/2017  . Spondylosis of lumbar region without myelopathy or radiculopathy 06/10/2017  . Chronic right-sided low back pain with sciatica 06/10/2017  . Primary osteoarthritis involving multiple joints 06/10/2017  . BPPV (benign paroxysmal positional vertigo), right 06/10/2017  . Nausea without vomiting 06/10/2017  . Environmental and seasonal allergies 02/12/2017  . 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/2017  . Morbid obesity ([place]) 02/12/2017  . 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  . Dupuytren's contracture of left hand 10/14/2016  . Anxiety disorder 09/03/2016  . GERD (gastroesophageal reflux disease) 09/03/2016  . Prophylactic use of raloxifene ([place]) 09/03/2016  . Hyperlipidemia associated with type 2 diabetes mellitus ([place]) 09/03/2016  . Type 2 diabetes mellitus with other specified complication ([place]) 45/12/8880  . Dyspnea 07/23/2013  . Essential hypertension     Conditions to be addressed/monitored: Depression; Mental Health Concerns  and Social Isolation  Care Plan : General Social Work (Adult)   Updates made by Greg Cutter, LCSW since 11/22/2020 12:00 AM    Problem: Coping Skills (General Plan of Care)     Long-Range Goal: Coping Skills Enhanced   Start Date: 11/22/2020  Recent Progress: On track  Note:   Evidence-based guidance:   Acknowledge, normalize and validate difficulty of making life-long lifestyle changes.   Identify current effective and ineffective coping strategies.   Encourage patient and caregiver participation in care to increase self-esteem, confidence and feelings of control.   Consider alternative and complementary therapy approaches such as meditation, mindfulness or yoga.   Encourage participation in cognitive behavioral therapy to foster a positive identity, increase self-awareness, as well as bolster self-esteem, confidence and self-efficacy.   Discuss spirituality; be present as concerns are identified; encourage journaling, prayer, worship services, meditation or pastoral counseling.   Encourage participation in pleasurable group activities such as hobbies, singing, sports or volunteering).   Encourage the use of mindfulness; refer for training or intensive intervention.   Consider the use of meditative movement therapy such as tai chi, yoga or qigong.  Promote a regular daily exercise program based on tolerance, ability and patient choice to support positive thinking about disease or aging.   Notes:   Current Barriers:  . Acute Mental Health needs related to Anxiety and ongoing grief symptoms  . Financial constraints related to managing health care expenses and affording bills . Limited social support . Limited access to food . Level of care concerns . ADL IADL limitations . Limited access to caregiver . Inability to perform ADL's independently . Inability to perform IADL's independently . Lacks knowledge of community resource: available food support resources  Timeframe:  Long-Range Goal Priority:  Medium  Start Date:  11/22/20                          Expected End Date:  02/19/21                    Follow Up Date- 01/16/21  Clinical Social Work Goal(s):  Marland Kitchen Over the next 120 days, patient will work with SW bi-monthly by telephone or in person to reduce or manage symptoms related to anxiety . Over the next 120 days, client will work with SW to address concerns related to gaining additional support/resources in order to improve self-care, physical health, emotional health and overall well-being.   Interventions: . Patient interviewed and appropriate assessments performed: brief mental health assessment . Patient has a sebaceous cyst between her shoulder blades that started on 11/06/20. She went to the ED for this concern on 11/08/20. Patient is still trying to get rid of this infection it has caused. Her dermatologist relocated to [facility] and she is waiting on call back from her insurance to make sure that if she completes a visit there, that it would be approved. Patient is taking an antibiotic but feels that the infection is still present. . Patient recently went on a weekend trip with her son in law and daughter. She shares that she is implementing socialization into her daily routine as much as possible. She shares that she has a group of friends that she meets with 2 x per week to eat and socialize.  . Patient reports ongoing grief but DENIED wanting CCM LCSW to place grief therapy referral on 10/17/20 and 11/22/20. Marland Kitchen Provided patient with information about available financial and food support resources within the area. Patient is agreeable to MOW referral through [facility] contract. LCSW completed referral and asked for meals to be delivered for 60 days while she is on the wait list for services. UPDATE- Patient is no longer receiving meals as she declined being in need of this service anymore. Patient reports that she is actually cooking again and does not need meal prep assistance at this time. Patient reports that she has started  cooking healthy meals each night for herself. . 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  . Discussed plans with patient for ongoing care management follow up and provided patient with direct contact information for care management team . Patient confirms healthy self-care implementation into her routine by eating healthy, drinking more fluids and stayting active. Patient reports that her this has improved her mental health as well. . Patient was provided positive reinforcement for recent self-care and healthy depression management implementation.  . Assisted patient/caregiver with obtaining information about health plan benefits . Provided education and assistance to client regarding Advanced Directives. . Provided education to patient/caregiver regarding level of  care options. . 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  . Patient reports that she is no longer able to get as much physical activity because her church is not allowing members to talk there like the use to. Patient shares that this was a great opportunity to socialize when she walked the church 23 laps as it equaled 1 mile.  . Patient shares that she has been managing her grief well. She states that a dear friend of hers lost their daughter and she was able to be a positive support for friend without it triggering her own grief (loss of spouse.)  . Bedelia confirms stable transportation to all future medical appointments. . Patient reports that the recent cholesterol medication that PCP put her on has started causing her leg weakness. She said that this very thing happened when she has tried other cholesterol medication. LCSW will update PCP.  Marland Kitchen LCSW spent extensive time educating patient on anxiety management coping skills and health self care . Psychoeducation and/or Health Education  provided  Patient Self Care Activities:  . Attends all scheduled provider appointments . Calls provider office for new concerns or questions .  Please see past updates related to this goal by clicking on the "Past Updates" button in the selected goal       Follow Up Plan: SW will follow up with patient by phone over the next quarter      [name], [place], MSW, [email or web address] Phone: [phone]

## 2020-11-23 DIAGNOSIS — D492 Neoplasm of unspecified behavior of bone, soft tissue, and skin: Secondary | ICD-10-CM | POA: Diagnosis not present

## 2020-11-23 DIAGNOSIS — L723 Sebaceous cyst: Secondary | ICD-10-CM | POA: Diagnosis not present

## 2020-11-24 DIAGNOSIS — D492 Neoplasm of unspecified behavior of bone, soft tissue, and skin: Secondary | ICD-10-CM | POA: Diagnosis not present

## 2020-11-24 DIAGNOSIS — L720 Epidermal cyst: Secondary | ICD-10-CM | POA: Diagnosis not present

## 2020-11-24 HISTORY — PX: CYST REMOVAL TRUNK: SHX6283

## 2020-11-27 DIAGNOSIS — Z7721 Contact with and (suspected) exposure to potentially hazardous body fluids: Secondary | ICD-10-CM | POA: Diagnosis not present

## 2020-11-29 ENCOUNTER — Other Ambulatory Visit: Payer: Self-pay | Admitting: *Deleted

## 2020-11-29 DIAGNOSIS — E785 Hyperlipidemia, unspecified: Secondary | ICD-10-CM

## 2020-11-29 DIAGNOSIS — Z0000 Encounter for general adult medical examination without abnormal findings: Secondary | ICD-10-CM

## 2020-11-29 DIAGNOSIS — I10 Essential (primary) hypertension: Secondary | ICD-10-CM

## 2020-11-29 DIAGNOSIS — E1169 Type 2 diabetes mellitus with other specified complication: Secondary | ICD-10-CM

## 2020-11-30 ENCOUNTER — Other Ambulatory Visit: Payer: Medicare HMO

## 2020-11-30 DIAGNOSIS — E1169 Type 2 diabetes mellitus with other specified complication: Secondary | ICD-10-CM | POA: Diagnosis not present

## 2020-11-30 DIAGNOSIS — E785 Hyperlipidemia, unspecified: Secondary | ICD-10-CM | POA: Diagnosis not present

## 2020-11-30 DIAGNOSIS — I10 Essential (primary) hypertension: Secondary | ICD-10-CM | POA: Diagnosis not present

## 2020-11-30 DIAGNOSIS — Z0000 Encounter for general adult medical examination without abnormal findings: Secondary | ICD-10-CM | POA: Diagnosis not present

## 2020-12-01 ENCOUNTER — Other Ambulatory Visit: Payer: Self-pay | Admitting: Family Medicine

## 2020-12-01 DIAGNOSIS — G8929 Other chronic pain: Secondary | ICD-10-CM

## 2020-12-01 DIAGNOSIS — M47816 Spondylosis without myelopathy or radiculopathy, lumbar region: Secondary | ICD-10-CM

## 2020-12-01 LAB — CBC WITH DIFFERENTIAL/PLATELET
Absolute Monocytes: 365 cells/uL (ref 200–950)
Basophils Absolute: 21 cells/uL (ref 0–200)
Basophils Relative: 0.5 %
Eosinophils Absolute: 82 cells/uL (ref 15–500)
Eosinophils Relative: 2 %
HCT: 36.6 % (ref 35.0–45.0)
Hemoglobin: 12.4 g/dL (ref 11.7–15.5)
Lymphs Abs: 1205 cells/uL (ref 850–3900)
MCH: 29.7 pg (ref 27.0–33.0)
MCHC: 33.9 g/dL (ref 32.0–36.0)
MCV: 87.6 fL (ref 80.0–100.0)
MPV: 11.7 fL (ref 7.5–12.5)
Monocytes Relative: 8.9 %
Neutro Abs: 2427 cells/uL (ref 1500–7800)
Neutrophils Relative %: 59.2 %
Platelets: 156 10*3/uL (ref 140–400)
RBC: 4.18 10*6/uL (ref 3.80–5.10)
RDW: 14.3 % (ref 11.0–15.0)
Total Lymphocyte: 29.4 %
WBC: 4.1 10*3/uL (ref 3.8–10.8)

## 2020-12-01 LAB — COMPLETE METABOLIC PANEL WITH GFR
AG Ratio: 2.1 (calc) (ref 1.0–2.5)
ALT: 11 U/L (ref 6–29)
AST: 14 U/L (ref 10–35)
Albumin: 4 g/dL (ref 3.6–5.1)
Alkaline phosphatase (APISO): 59 U/L (ref 37–153)
BUN/Creatinine Ratio: 16 (calc) (ref 6–22)
BUN: 9 mg/dL (ref 7–25)
CO2: 29 mmol/L (ref 20–32)
Calcium: 9 mg/dL (ref 8.6–10.4)
Chloride: 104 mmol/L (ref 98–110)
Creat: 0.57 mg/dL — ABNORMAL LOW (ref 0.60–0.93)
GFR, Est African American: 104 mL/min/{1.73_m2} (ref >=60)
GFR, Est Non African American: 90 mL/min/{1.73_m2} (ref >=60)
Globulin: 1.9 g/dL (calc) (ref 1.9–3.7)
Glucose, Bld: 126 mg/dL — ABNORMAL HIGH (ref 65–99)
Potassium: 4 mmol/L (ref 3.5–5.3)
Sodium: 141 mmol/L (ref 135–146)
Total Bilirubin: 0.6 mg/dL (ref 0.2–1.2)
Total Protein: 5.9 g/dL — ABNORMAL LOW (ref 6.1–8.1)

## 2020-12-01 LAB — LIPID PANEL
Cholesterol: 270 mg/dL — ABNORMAL HIGH (ref <200)
HDL: 42 mg/dL — ABNORMAL LOW (ref >=50)
LDL Cholesterol (Calc): 165 mg/dL (calc) — ABNORMAL HIGH
Non-HDL Cholesterol (Calc): 228 mg/dL (calc) — ABNORMAL HIGH (ref <130)
Total CHOL/HDL Ratio: 6.4 (calc) — ABNORMAL HIGH (ref <5.0)
Triglycerides: 371 mg/dL — ABNORMAL HIGH (ref <150)

## 2020-12-01 LAB — TSH: TSH: 3.87 mIU/L (ref 0.40–4.50)

## 2020-12-01 LAB — HEMOGLOBIN A1C
Hgb A1c MFr Bld: 6.3 % of total Hgb — ABNORMAL HIGH (ref <5.7)
Mean Plasma Glucose: 134 mg/dL
eAG (mmol/L): 7.4 mmol/L

## 2020-12-07 ENCOUNTER — Encounter: Payer: Self-pay | Admitting: Family Medicine

## 2020-12-07 ENCOUNTER — Ambulatory Visit (INDEPENDENT_AMBULATORY_CARE_PROVIDER_SITE_OTHER): Payer: Medicare HMO | Admitting: Family Medicine

## 2020-12-07 ENCOUNTER — Other Ambulatory Visit: Payer: Self-pay

## 2020-12-07 VITALS — BP 160/84 | HR 71 | Temp 96.9°F | Ht 66.0 in | Wt 225.2 lb

## 2020-12-07 DIAGNOSIS — E7849 Other hyperlipidemia: Secondary | ICD-10-CM | POA: Diagnosis not present

## 2020-12-07 DIAGNOSIS — I10 Essential (primary) hypertension: Secondary | ICD-10-CM | POA: Diagnosis not present

## 2020-12-07 DIAGNOSIS — Z0000 Encounter for general adult medical examination without abnormal findings: Secondary | ICD-10-CM | POA: Diagnosis not present

## 2020-12-07 DIAGNOSIS — E1169 Type 2 diabetes mellitus with other specified complication: Secondary | ICD-10-CM | POA: Diagnosis not present

## 2020-12-07 DIAGNOSIS — E785 Hyperlipidemia, unspecified: Secondary | ICD-10-CM | POA: Diagnosis not present

## 2020-12-07 DIAGNOSIS — G720 Drug-induced myopathy: Secondary | ICD-10-CM | POA: Diagnosis not present

## 2020-12-07 MED ORDER — METFORMIN HCL 500 MG PO TABS: 500.0000 mg | ORAL_TABLET | Freq: Two times a day (BID) | ORAL | 3 refills | Status: DC

## 2020-12-07 MED ORDER — IRBESARTAN 300 MG PO TABS: 300.0000 mg | ORAL_TABLET | Freq: Every day | ORAL | 3 refills | Status: DC

## 2020-12-07 MED ORDER — REPATHA PUSHTRONEX SYSTEM 420 MG/3.5ML ~~LOC~~ SOCT: 420.0000 mg | SUBCUTANEOUS | 11 refills | Status: DC

## 2020-12-07 MED ORDER — AMLODIPINE BESYLATE 10 MG PO TABS: 10.0000 mg | ORAL_TABLET | Freq: Every day | ORAL | 3 refills | Status: DC

## 2020-12-07 MED ORDER — HYDROCHLOROTHIAZIDE 25 MG PO TABS: 25.0000 mg | ORAL_TABLET | Freq: Every day | ORAL | 3 refills | Status: DC

## 2020-12-07 NOTE — Patient Instructions (Addendum)
Thank you for coming to the office today.  Recommend Diabetic Eye Exam - Dr Wyatt Portela - send [place] a copy of the report.  Start Repatha once monthly autoinjection unit Will need to get prior auth from insurance  Stop Lisinopril Start Irbesartan half pill daily for 1-2 weeks then inc to whole tab daily  Check BP regularly, goal < 140/90  Limit sodium  DUE for FASTING BLOOD WORK (no food or drink after midnight before the lab appointment, only water or coffee without cream/sugar on the morning of)  SCHEDULE "Lab Only" visit in the morning at the clinic for lab draw in 4 MONTHS   - Make sure Lab Only appointment is at about 1 week before your next appointment, so that results will be available  For Lab Results, once available within 2-3 days of blood draw, you can can log in to MyChart online to view your results and a brief explanation. Also, we can discuss results at next follow-up visit.    Please schedule a Follow-up Appointment to: Return in about 4 months (around 04/08/2021) for 4 month fasting lab then 1 week later HTN, HLD, med follow-up.  If you have any other questions or concerns, please feel free to call the office or send a message through [place]. You may also schedule an earlier appointment if necessary.  Additionally, you may be receiving a survey about your experience at our office within a few days to 1 week by e-mail or mail. We value your feedback.  Nobie Putnam, DO [place]

## 2020-12-07 NOTE — Assessment & Plan Note (Signed)
Elevated BP still  Discontinue Lisinopril 40 - switch to ARB. Irbesartan 300mg  daily - use half tab for 150mg  for first 1-2 week then inc to 300mg  Continue other meds Amlodipine 10 and HCTZ 25mg  daily Monitor BP Follow-up as planned

## 2020-12-07 NOTE — Assessment & Plan Note (Signed)
Clinically morbid obesity with BMI >36 with comorbid conditions Hypertension, Hyperlipidemia, Pre-Diabetes, Osteoporosis  Goals and strategy for wt loss diet primarily, low carb reduce portions, gradually increasing home exercise plan

## 2020-12-07 NOTE — Assessment & Plan Note (Signed)
Secondary to statin. Failed Zetia, Simvastatin, Rosuvastatin, Atorvastatin and intermittent low dosing  Order [id number] today

## 2020-12-07 NOTE — Assessment & Plan Note (Signed)
Controlled DM, with A1c 6.3, improved Concern with obesity, HTN, HLD  Plan:  1. Continue Metformin IR 500mg  BID - future dose adjust as needed 2. Encourage improved lifestyle - low carb, low sugar diet, reduce portion size, continue improving regular exercise  Continue to reconsider GLP1 therapy

## 2020-12-07 NOTE — Assessment & Plan Note (Signed)
Uncontrolled cholesterol Failed Statin therapy Simvastatin, Atorvastatin, Rosuvastatin, intermittent dosing and failed Zetia  The 10-year ASCVD risk score [name], et al., 2013) is: 55.3%  Plan: 1. START Repatha 420mg  monthly [facility] system - will need PA 2. Encourage improved lifestyle - low carb/cholesterol, reduce portion size, continue improving regular exercise  Consider refer to [place] if cannot get PA approved

## 2020-12-07 NOTE — Progress Notes (Signed)
Subjective:    Patient ID: Yvette [name], female    DOB: 02-28-1946, 75 y.o.   MRN: [id number]  Yvette [name] is a 75 y.o. female presenting on 12/07/2020 for Annual Exam (Pt has some tenderness going on for the past week on her right side below her rib cage. Pt notices it more while sitting than being active.)  Here with daughter, Jenny Reichmann.  HPI   Here for Annual Physical and Lab Review.  History of back pain / sciatica She is taking Gabapentin 200mg  nightly with good results  Type 2 Diabetes/ Hyperglycemia/ Morbid Obesity BMI >36 Weight stable in past 6 months A1c stable at 6.3, with improvement.  Meds:Metformin 500 BID - Of note she called insurance, Ozempic, Trulicity covered - but she declines to start today Currently on ACEi, ASA 81, statin Lifestyle: - Diet goal to improve diet still - Exercise: Limited due to arthritisand also more sedentary limited ability to go exercise now with COVID19 pandemic Admits had some polyuria prior, now seems to be reduced  Last done 2021 - Dr Wyatt [facility] [place], mild cataract no surgery needed, no DM retinopathy Denies hypoglycemia, visual changes, numbness or tingling.  Elevated Liver Enzymes improved liver enzymes, off naproxen and tylenol Still elevated cholesterol History of episodic RUQ discomfort, history of gallbladder flare, still has gallbladder.  HYPERLIPIDEMIA / Familial Hyperlipidemia - Reports concernswith cholesterol, prior history elevated. Last lipid 05/2020 and then 11/2020 persistent elevated LDL and other lipids, cannot take Rosuvastatin - Cannot take Zetia, failed trial on this with also having myalgia Additional statin therapy failed due to myalgia Atorvastatin, Rosuvastatin, Simvastatin Interested in PSK9  Vitamin B12 Last lab >2000 elevated, she has been taking B12 supplement every other day Vitamin D lab shows improved >30 now  CHRONIC HTN: Home BP elevated still. Higher in doctors office. Current  Meds -Amlodipine10mg  daily, Lisinopril 40mg  daily, HCTZ25mg  daily Reportsnormally good compliance.Tolerating well, w/o complaints. Admits swelling of feet and hands if inc salt intake Denies CP, dyspnea, HA, dizziness / lightheadedness  Follow up LE edema venous insufficiency, improved, less red, hasn't taken lasix, swelling improved     Depression screen [facility] 2/9 06/02/2020 04/07/2020 12/02/2019  Decreased Interest 0 0 0  Down, Depressed, Hopeless 0 0 0  PHQ - 2 Score 0 0 0  Altered sleeping - 0 -  Tired, decreased energy - 0 -  Change in appetite - 0 -  Feeling bad or failure about yourself  - 0 -  Trouble concentrating - 0 -  Moving slowly or fidgety/restless - 0 -  Suicidal thoughts - 0 -  PHQ-9 Score - 0 -  Difficult doing work/chores - Not difficult at all -    Past Medical History:  Diagnosis Date  . Arthropathy, unspecified, site unspecified   . Esophageal reflux   . Osteoarthritis   . Osteoporosis, unspecified   . Other abnormal glucose   . Other and unspecified hyperlipidemia   . Symptomatic menopausal or female climacteric states   . Syncope and collapse    Past Surgical History:  Procedure Laterality Date  . ABDOMINAL HYSTERECTOMY    . APPENDECTOMY    . BREAST BIOPSY Left 1970's   neg  . COLONOSCOPY    . FINGER FRACTURE SURGERY     Social History   Socioeconomic History  . Marital status: Widowed    Spouse name: Not on file  . Number of children: Not on file  . Years of education: Not on file  .  Highest education level: Not on file  Occupational History  . Not on file  Tobacco Use  . Smoking status: Former Smoker    Packs/day: 1.00    Years: 26.00    Pack years: 26.00    Types: Cigarettes    Quit date: 06/07/1992    Years since quitting: 28.5  . Smokeless tobacco: Former Network engineer  . Vaping Use: Never used  Substance and Sexual Activity  . Alcohol use: Not Currently    Alcohol/week: 0.0 standard drinks    Comment: occassional  .  Drug use: No  . Sexual activity: Not on file  Other Topics Concern  . Not on file  Social History Narrative  . Not on file   Social Determinants of Health   Financial Resource Strain: Not on file  Food Insecurity: Not on file  Transportation Needs: Not on file  Physical Activity: Not on file  Stress: Not on file  Social Connections: Not on file  Intimate Partner Violence: Not on file   Family History  Problem Relation Age of Onset  . Diabetes Mother   . Heart disease Mother   . Heart attack Mother   . Ulcers Father   . Gallstones Father   . Heart attack Brother    Current Outpatient Medications on File Prior to Visit  Medication Sig  . Acetaminophen (TYLENOL 8 HOUR ARTHRITIS PAIN PO) Take by mouth. 2 in am 1 in pm  . albuterol (VENTOLIN HFA) 108 (90 Base) MCG/ACT inhaler Inhale 2 puffs into the lungs every 4 (four) hours as needed for wheezing or shortness of breath (cough).  Marland Kitchen aspirin 81 MG tablet Take 81 mg by mouth daily.  . beta carotene w/minerals (OCUVITE) tablet Take 1 tablet by mouth daily.  . Calcium-Phosphorus-Vitamin D (CITRACAL +D3 PO) Take 1 Dose by mouth 2 (two) times daily.  . Cetirizine HCl 10 MG CAPS Take by mouth.  Marland Kitchen Co-Enzyme Q-10 100 MG CAPS Take 1 capsule by mouth daily.  . fluticasone (FLONASE) 50 MCG/ACT nasal spray Place 2 sprays into both nostrils daily.  . furosemide (LASIX) 20 MG tablet Take 1-2 tablets (20-40 mg total) by mouth daily as needed for fluid or edema. Recommend use up to 3-5 days at a time if needed.  . gabapentin (NEURONTIN) 100 MG capsule Take 2 capsules (200 mg total) by mouth at bedtime.  Javier Docker Oil 300 MG CAPS Take by mouth daily.  . Mag Aspart-Potassium Aspart (POTASSIUM & MAGNESIUM ASPARTAT PO) Take 1 tablet by mouth daily.  . Multiple Vitamin (MULTIVITAMIN) tablet Take 1 tablet by mouth daily.  . naproxen (NAPROSYN) 500 MG tablet Take 1 tablet (500 mg total) by mouth 2 (two) times daily with a meal. For 1-2 weeks then as needed   . pantoprazole (PROTONIX) 40 MG tablet Take 1 tablet (40 mg total) by mouth daily before breakfast.   No current facility-administered medications on file prior to visit.    Review of Systems  Constitutional: Negative for activity change, appetite change, chills, diaphoresis, fatigue and fever.  HENT: Negative for congestion and hearing loss.   Eyes: Negative for visual disturbance.  Respiratory: Negative for cough, chest tightness, shortness of breath and wheezing.   Cardiovascular: Negative for chest pain, palpitations and leg swelling.  Gastrointestinal: Negative for abdominal pain, constipation, diarrhea, nausea and vomiting.  Genitourinary: Negative for dysuria, frequency and hematuria.  Musculoskeletal: Negative for arthralgias and neck pain.  Skin: Negative for rash.  Allergic/Immunologic: Negative for environmental allergies.  Neurological: Negative for dizziness, weakness, light-headedness, numbness and headaches.  Hematological: Negative for adenopathy.  Psychiatric/Behavioral: Negative for behavioral problems, dysphoric mood and sleep disturbance.   Per HPI unless specifically indicated above     Objective:    BP (!) 160/84 (BP Location: Left Arm, Cuff Size: Normal)   Pulse 71   Temp (!) 96.9 F (36.1 C) (Temporal)   Ht 5\' 6"  (1.676 m)   Wt 225 lb 3.2 oz (102.2 kg)   SpO2 99%   BMI 36.35 kg/m   Wt Readings from Last 3 Encounters:  12/07/20 225 lb 3.2 oz (102.2 kg)  11/08/20 220 lb (99.8 kg)  06/02/20 225 lb (102.1 kg)    Physical Exam Vitals and nursing note reviewed.  Constitutional:      General: She is not in acute distress.    Appearance: She is well-developed and well-nourished. She is obese. She is not diaphoretic.     Comments: Well-appearing, comfortable, cooperative  HENT:     Head: Normocephalic and atraumatic.     Mouth/Throat:     Mouth: Oropharynx is clear and moist.  Eyes:     General:        Right eye: No discharge.        Left eye: No  discharge.     Extraocular Movements: EOM normal.     Conjunctiva/sclera: Conjunctivae normal.     Pupils: Pupils are equal, round, and reactive to light.  Neck:     Thyroid: No thyromegaly.  Cardiovascular:     Rate and Rhythm: Normal rate and regular rhythm.     Pulses: Intact distal pulses.     Heart sounds: Normal heart sounds. No murmur heard.   Pulmonary:     Effort: Pulmonary effort is normal. No respiratory distress.     Breath sounds: Normal breath sounds. No wheezing or rales.  Abdominal:     General: Bowel sounds are normal. There is no distension.     Palpations: Abdomen is soft. There is no mass.     Tenderness: There is no abdominal tenderness.  Musculoskeletal:        General: No tenderness. Normal range of motion.     Cervical back: Normal range of motion and neck supple.     Right lower leg: Edema (trace) present.     Left lower leg: Edema (trace) present.     Comments: Upper / Lower Extremities: - Normal muscle tone, strength bilateral upper extremities 5/5, lower extremities 5/5  Lymphadenopathy:     Cervical: No cervical adenopathy.  Skin:    General: Skin is warm and dry.     Findings: No erythema or rash.  Neurological:     Mental Status: She is alert and oriented to person, place, and time.     Comments: Distal sensation intact to light touch all extremities  Psychiatric:        Mood and Affect: Mood and affect normal.        Behavior: Behavior normal.     Comments: Well groomed, good eye contact, normal speech and thoughts        Results for orders placed or performed in visit on 11/29/20  TSH  Result Value Ref Range   TSH 3.87 0.40 - 4.50 mIU/L  Lipid panel  Result Value Ref Range   Cholesterol 270 (H) <200 mg/dL   HDL 42 (L) > OR = 50 mg/dL   Triglycerides 371 (H) <150 mg/dL   LDL Cholesterol (Calc) 165 (H) mg/dL (calc)  Total CHOL/HDL Ratio 6.4 (H) <5.0 (calc)   Non-HDL Cholesterol (Calc) 228 (H) <130 mg/dL (calc)  COMPLETE METABOLIC  PANEL WITH GFR  Result Value Ref Range   Glucose, Bld 126 (H) 65 - 99 mg/dL   BUN 9 7 - 25 mg/dL   Creat 0.57 (L) 0.60 - 0.93 mg/dL   GFR, Est Non African American 90 > OR = 60 mL/min/1.35m2   GFR, Est African American 104 > OR = 60 mL/min/1.11m2   BUN/Creatinine Ratio 16 6 - 22 (calc)   Sodium 141 135 - 146 mmol/L   Potassium 4.0 3.5 - 5.3 mmol/L   Chloride 104 98 - 110 mmol/L   CO2 29 20 - 32 mmol/L   Calcium 9.0 8.6 - 10.4 mg/dL   Total Protein 5.9 (L) 6.1 - 8.1 g/dL   Albumin 4.0 3.6 - 5.1 g/dL   Globulin 1.9 1.9 - 3.7 g/dL (calc)   AG Ratio 2.1 1.0 - 2.5 (calc)   Total Bilirubin 0.6 0.2 - 1.2 mg/dL   Alkaline phosphatase (APISO) 59 37 - 153 U/L   AST 14 10 - 35 U/L   ALT 11 6 - 29 U/L  CBC with Differential/Platelet  Result Value Ref Range   WBC 4.1 3.8 - 10.8 Thousand/uL   RBC 4.18 3.80 - 5.10 Million/uL   Hemoglobin 12.4 11.7 - 15.5 g/dL   HCT 36.6 35.0 - 45.0 %   MCV 87.6 80.0 - 100.0 fL   MCH 29.7 27.0 - 33.0 pg   MCHC 33.9 32.0 - 36.0 g/dL   RDW 14.3 11.0 - 15.0 %   Platelets 156 140 - 400 Thousand/uL   MPV 11.7 7.5 - 12.5 fL   Neutro Abs 2,427 1,500 - 7,800 cells/uL   Lymphs Abs 1,205 850 - 3,900 cells/uL   Absolute Monocytes 365 200 - 950 cells/uL   Eosinophils Absolute 82 15 - 500 cells/uL   Basophils Absolute 21 0 - 200 cells/uL   Neutrophils Relative % 59.2 %   Total Lymphocyte 29.4 %   Monocytes Relative 8.9 %   Eosinophils Relative 2.0 %   Basophils Relative 0.5 %  Hemoglobin A1c  Result Value Ref Range   Hgb A1c MFr Bld 6.3 (H) <5.7 % of total Hgb   Mean Plasma Glucose 134 mg/dL   eAG (mmol/L) 7.4 mmol/L      Assessment & Plan:   Problem List Items Addressed This Visit    Type 2 diabetes mellitus with other specified complication ([place])    Controlled DM, with A1c 6.3, improved Concern with obesity, HTN, HLD  Plan:  1. Continue Metformin IR 500mg  BID - future dose adjust as needed 2. Encourage improved lifestyle - low carb, low sugar diet,  reduce portion size, continue improving regular exercise  Continue to reconsider GLP1 therapy      Relevant Medications   metFORMIN (GLUCOPHAGE) 500 MG tablet   irbesartan (AVAPRO) 300 MG tablet   Morbid obesity (HCC)    Clinically morbid obesity with BMI >36 with comorbid conditions Hypertension, Hyperlipidemia, Pre-Diabetes, Osteoporosis  Goals and strategy for wt loss diet primarily, low carb reduce portions, gradually increasing home exercise plan      Relevant Medications   metFORMIN (GLUCOPHAGE) 500 MG tablet   Hyperlipidemia associated with type 2 diabetes mellitus (HCC)   Relevant Medications   REPATHA PUSHTRONEX SYSTEM 420 MG/3.5ML SOCT   metFORMIN (GLUCOPHAGE) 500 MG tablet   irbesartan (AVAPRO) 300 MG tablet   Familial hyperlipidemia    Uncontrolled cholesterol  Failed Statin therapy Simvastatin, Atorvastatin, Rosuvastatin, intermittent dosing and failed Zetia  The 10-year ASCVD risk score [name], et al., 2013) is: 55.3%  Plan: 1. START Repatha 420mg  monthly Pushtronex system - will need PA 2. Encourage improved lifestyle - low carb/cholesterol, reduce portion size, continue improving regular exercise  Consider refer to [place] if cannot get PA approved      Relevant Medications   REPATHA PUSHTRONEX SYSTEM 420 MG/3.5ML SOCT   hydrochlorothiazide (HYDRODIURIL) 25 MG tablet   amLODipine (NORVASC) 10 MG tablet   irbesartan (AVAPRO) 300 MG tablet   Essential hypertension    Elevated BP still  Discontinue Lisinopril 40 - switch to ARB. Irbesartan 300mg  daily - use half tab for 150mg  for first 1-2 week then inc to 300mg  Continue other meds Amlodipine 10 and HCTZ 25mg  daily Monitor BP Follow-up as planned      Relevant Medications   REPATHA PUSHTRONEX SYSTEM 420 MG/3.5ML SOCT   hydrochlorothiazide (HYDRODIURIL) 25 MG tablet   amLODipine (NORVASC) 10 MG tablet   irbesartan (AVAPRO) 300 MG tablet   Drug-induced myopathy    Secondary to  statin. Failed Zetia, Simvastatin, Rosuvastatin, Atorvastatin and intermittent low dosing  Order [id number] today       Other Visit Diagnoses    Annual physical exam    -  Primary      No orders of the defined types were placed in this encounter.  Updated Health Maintenance information Reviewed recent lab results with patient Encouraged improvement to lifestyle with diet and exercise Goal of weight loss   ____________________________________________________ Additional Rx Information (May be used for Prior Authorization if required)  Medication name and Strength: Repatha 420 mg / 3.5 mL Pushtronex System  1. Primary Diagnosis and ICD10 Code: Familial Hyperlipidemia (E78.49) 2. Secondary Diagnosis and ICD10 Code: Type 2 Diabetes, Morbid Obesity  (E11.69, E66.01) 3. Previous Failed Medications (Duration or Start Date) a. Atorvastatin (2016) b. Simvastatin (2016-2018) c. Rosuvastatin (2020-2021) d. Ezetimibe (2021-2022) 4. Quantity and Duration of New Medication: 3.5 mL for 30 days 5. Additional Supporting Information: a. Lab result cholesterol panel - Total Cholesterol 270, HDL 42, LDL 165, Triglyceride 371. Known family history of Hyperlipidemia ___________________________________________  CC chart to [place] ordered this encounter  Medications  . [place] 420 MG/3.5ML SOCT    Sig: Inject 420 mg into the skin every 30 (thirty) days.    Dispense:  3.5 mL    Refill:  11  . metFORMIN (GLUCOPHAGE) 500 MG tablet    Sig: Take 1 tablet (500 mg total) by mouth 2 (two) times daily with a meal.    Dispense:  180 tablet    Refill:  3  . hydrochlorothiazide (HYDRODIURIL) 25 MG tablet    Sig: Take 1 tablet (25 mg total) by mouth daily.    Dispense:  90 tablet    Refill:  3  . amLODipine (NORVASC) 10 MG tablet    Sig: Take 1 tablet (10 mg total) by mouth daily.    Dispense:  90 tablet    Refill:  3  . irbesartan (AVAPRO) 300 MG tablet    Sig:  Take 1 tablet (300 mg total) by mouth daily. May start with half tab daily for 1-2 weeks then increase to 1 whole tab daily    Dispense:  90 tablet    Refill:  3    Stop taking Lisinopril, switch to Irbesartan     Follow up plan: Return in  about 4 months (around 04/08/2021) for 4 month fasting lab then 1 week later HTN, HLD, med follow-up.  Nobie Putnam, [facility] 12/07/2020, 10:55 AM

## 2020-12-08 ENCOUNTER — Telehealth: Payer: Self-pay | Admitting: Family Medicine

## 2020-12-08 NOTE — Telephone Encounter (Signed)
I called the patient and notified her that the PA was done and improved on yesterday. I call [facility] and notified them of the approval. They resubmitted the claim. The medication was approved at $45.00 co-pay.

## 2020-12-08 NOTE — Telephone Encounter (Signed)
Pt was advised by the pharmacy that her RX for [place] 420 MG/3.5ML SOCT  Needs PA / please advise

## 2020-12-15 ENCOUNTER — Encounter: Payer: Self-pay | Admitting: Family Medicine

## 2020-12-15 DIAGNOSIS — H5203 Hypermetropia, bilateral: Secondary | ICD-10-CM | POA: Diagnosis not present

## 2020-12-15 LAB — HM DIABETES EYE EXAM

## 2020-12-21 ENCOUNTER — Telehealth: Payer: Self-pay

## 2020-12-21 DIAGNOSIS — R1900 Intra-abdominal and pelvic swelling, mass and lump, unspecified site: Secondary | ICD-10-CM | POA: Diagnosis not present

## 2020-12-21 DIAGNOSIS — R2231 Localized swelling, mass and lump, right upper limb: Secondary | ICD-10-CM | POA: Diagnosis not present

## 2020-12-21 DIAGNOSIS — D492 Neoplasm of unspecified behavior of bone, soft tissue, and skin: Secondary | ICD-10-CM | POA: Diagnosis not present

## 2020-12-21 DIAGNOSIS — Z79899 Other long term (current) drug therapy: Secondary | ICD-10-CM | POA: Diagnosis not present

## 2020-12-21 DIAGNOSIS — Z7984 Long term (current) use of oral hypoglycemic drugs: Secondary | ICD-10-CM | POA: Diagnosis not present

## 2020-12-21 DIAGNOSIS — I10 Essential (primary) hypertension: Secondary | ICD-10-CM | POA: Diagnosis not present

## 2020-12-21 DIAGNOSIS — R2241 Localized swelling, mass and lump, right lower limb: Secondary | ICD-10-CM | POA: Diagnosis not present

## 2020-12-21 DIAGNOSIS — E119 Type 2 diabetes mellitus without complications: Secondary | ICD-10-CM | POA: Diagnosis not present

## 2020-12-21 DIAGNOSIS — M1990 Unspecified osteoarthritis, unspecified site: Secondary | ICD-10-CM | POA: Diagnosis not present

## 2020-12-21 DIAGNOSIS — Z87891 Personal history of nicotine dependence: Secondary | ICD-10-CM | POA: Diagnosis not present

## 2020-12-21 NOTE — Telephone Encounter (Signed)
Attempted to contact the patient, no answer. Dr. Raliegh Ip sent a response to her mychart message.

## 2020-12-21 NOTE — Telephone Encounter (Signed)
Copied from [place] [id number]. Topic: General - Other >> Dec 21, 2020  2:59 PM Pawlus, Brayton Layman A wrote: Reason for CRM: Pt would like a call back regarding her message which was sent via [place].

## 2020-12-21 NOTE — Telephone Encounter (Signed)
I just sent her response. Message was sent this afternoon.  It did not seem urgent. Her BP readings are improved in 150-140 / 70-80 range.  Let me know if there is another concern that she has. I am okay with her readings  Nobie Putnam, DO [facility] 12/21/2020, 4:15 PM

## 2020-12-25 DIAGNOSIS — D487 Neoplasm of uncertain behavior of other specified sites: Secondary | ICD-10-CM | POA: Diagnosis not present

## 2020-12-25 DIAGNOSIS — L723 Sebaceous cyst: Secondary | ICD-10-CM | POA: Diagnosis not present

## 2020-12-25 DIAGNOSIS — Z01818 Encounter for other preprocedural examination: Secondary | ICD-10-CM | POA: Diagnosis not present

## 2020-12-25 DIAGNOSIS — I4510 Unspecified right bundle-branch block: Secondary | ICD-10-CM | POA: Diagnosis not present

## 2020-12-26 ENCOUNTER — Ambulatory Visit (INDEPENDENT_AMBULATORY_CARE_PROVIDER_SITE_OTHER): Payer: Medicare HMO

## 2020-12-26 VITALS — Ht 66.0 in | Wt 219.0 lb

## 2020-12-26 DIAGNOSIS — Z0000 Encounter for general adult medical examination without abnormal findings: Secondary | ICD-10-CM

## 2020-12-26 NOTE — Progress Notes (Signed)
I connected with Yvette [name] today by telephone and verified that I am speaking with the correct person using two identifiers. Location patient: home Location provider: work Persons participating in the virtual visit: Yvette [name], [name] LPN.   I discussed the limitations, risks, security and privacy concerns of performing an evaluation and management service by telephone and the availability of in person appointments. I also discussed with the patient that there may be a patient responsible charge related to this service. The patient expressed understanding and verbally consented to this telephonic visit.    Interactive audio and video telecommunications were attempted between this provider and patient, however failed, due to patient having technical difficulties OR patient did not have access to video capability.  We continued and completed visit with audio only.     Vital signs may be patient reported or missing.  Subjective:   Yvette [name] is a 75 y.o. female who presents for Medicare Annual (Subsequent) preventive examination.  Review of Systems     Cardiac Risk Factors include: advanced age (>75men, >13 women);diabetes mellitus;dyslipidemia;hypertension;obesity (BMI >30kg/m2)     Objective:    Today's Vitals   12/26/20 0856  Weight: 219 lb (99.3 kg)  Height: 5\' 6"  (1.676 m)   Body mass index is 35.35 kg/m.  Advanced Directives 12/26/2020 11/30/2019 06/06/2015 06/06/2015  Does Patient Have a Medical Advance Directive? Yes Yes Yes No;Yes  Type of Paramedic of [place];Living will Living will;Healthcare Power of Attorney Living will Living will  Does patient want to make changes to medical advance directive? - - No - Patient declined -  Copy of [place] in Chart? No - copy requested No - copy requested No - copy requested No - copy requested    Current Medications (verified) Outpatient Encounter Medications as of  12/26/2020  Medication Sig  . Acetaminophen (TYLENOL 8 HOUR ARTHRITIS PAIN PO) Take by mouth. 2 in am 1 in pm  . amLODipine (NORVASC) 10 MG tablet Take 1 tablet (10 mg total) by mouth daily.  Marland Kitchen aspirin 81 MG tablet Take 81 mg by mouth daily.  . beta carotene w/minerals (OCUVITE) tablet Take 1 tablet by mouth daily.  . Calcium-Phosphorus-Vitamin D (CITRACAL +D3 PO) Take 1 Dose by mouth 2 (two) times daily.  . Cetirizine HCl 10 MG CAPS Take by mouth.  Marland Kitchen Co-Enzyme Q-10 100 MG CAPS Take 1 capsule by mouth daily.  . fluticasone (FLONASE) 50 MCG/ACT nasal spray Place 2 sprays into both nostrils daily.  Marland Kitchen gabapentin (NEURONTIN) 100 MG capsule Take 2 capsules (200 mg total) by mouth at bedtime.  . irbesartan (AVAPRO) 300 MG tablet Take 1 tablet (300 mg total) by mouth daily. May start with half tab daily for 1-2 weeks then increase to 1 whole tab daily  . Krill Oil 300 MG CAPS Take by mouth daily.  . metFORMIN (GLUCOPHAGE) 500 MG tablet Take 1 tablet (500 mg total) by mouth 2 (two) times daily with a meal.  . Multiple Vitamin (MULTIVITAMIN) tablet Take 1 tablet by mouth daily.  . naproxen (NAPROSYN) 500 MG tablet Take 1 tablet (500 mg total) by mouth 2 (two) times daily with a meal. For 1-2 weeks then as needed  . pantoprazole (PROTONIX) 40 MG tablet Take 1 tablet (40 mg total) by mouth daily before breakfast.  . [place] 420 MG/3.5ML SOCT Inject 420 mg into the skin every 30 (thirty) days.  Marland Kitchen albuterol (VENTOLIN HFA) 108 (90 Base) MCG/ACT inhaler Inhale  2 puffs into the lungs every 4 (four) hours as needed for wheezing or shortness of breath (cough). (Patient not taking: Reported on 12/26/2020)  . furosemide (LASIX) 20 MG tablet Take 1-2 tablets (20-40 mg total) by mouth daily as needed for fluid or edema. Recommend use up to 3-5 days at a time if needed. (Patient not taking: Reported on 12/26/2020)  . hydrochlorothiazide (HYDRODIURIL) 25 MG tablet Take 1 tablet (25 mg total) by mouth  daily. (Patient not taking: Reported on 12/26/2020)  . Mag Aspart-Potassium Aspart (POTASSIUM & MAGNESIUM ASPARTAT PO) Take 1 tablet by mouth daily. (Patient not taking: Reported on 12/26/2020)   No facility-administered encounter medications on file as of 12/26/2020.    Allergies (verified) Simvastatin and Erythromycin   History: Past Medical History:  Diagnosis Date  . Arthropathy, unspecified, site unspecified   . Esophageal reflux   . Osteoarthritis   . Osteoporosis, unspecified   . Other abnormal glucose   . Other and unspecified hyperlipidemia   . Symptomatic menopausal or female climacteric states   . Syncope and collapse    Past Surgical History:  Procedure Laterality Date  . ABDOMINAL HYSTERECTOMY    . APPENDECTOMY    . BREAST BIOPSY Left 1970's   neg  . COLONOSCOPY    . CYST REMOVAL TRUNK  11/24/2020   from back  . FINGER FRACTURE SURGERY     Family History  Problem Relation Age of Onset  . Diabetes Mother   . Heart disease Mother   . Heart attack Mother   . Ulcers Father   . Gallstones Father   . Heart attack Brother    Social History   Socioeconomic History  . Marital status: Widowed    Spouse name: Not on file  . Number of children: Not on file  . Years of education: Not on file  . Highest education level: Not on file  Occupational History  . Occupation: retired  Tobacco Use  . Smoking status: Former Smoker    Packs/day: 1.00    Years: 26.00    Pack years: 26.00    Types: Cigarettes    Quit date: 06/07/1992    Years since quitting: 28.5  . Smokeless tobacco: Former Network engineer  . Vaping Use: Never used  Substance and Sexual Activity  . Alcohol use: Not Currently    Alcohol/week: 0.0 standard drinks    Comment: occassional  . Drug use: No  . Sexual activity: Not on file  Other Topics Concern  . Not on file  Social History Narrative  . Not on file   Social Determinants of Health   Financial Resource Strain: Low Risk   . Difficulty  of Paying Living Expenses: Not hard at all  Food Insecurity: No Food Insecurity  . Worried About Charity fundraiser in the Last Year: Never true  . Ran Out of Food in the Last Year: Never true  Transportation Needs: No Transportation Needs  . Lack of Transportation (Medical): No  . Lack of Transportation (Non-Medical): No  Physical Activity: Insufficiently Active  . Days of Exercise per Week: 5 days  . Minutes of Exercise per Session: 20 min  Stress: No Stress Concern Present  . Feeling of Stress : Not at all  Social Connections: Not on file    Tobacco Counseling Counseling given: Not Answered   Clinical Intake:  Pre-visit preparation completed: Yes  Pain : No/denies pain     Nutritional Status: BMI > 30  Obese Nutritional  Risks: None Diabetes: Yes  How often do you need to have someone help you when you read instructions, pamphlets, or other written materials from your doctor or pharmacy?: 1 - Never What is the last grade level you completed in school?: some college  Diabetic? Yes Nutrition Risk Assessment:  Has the patient had any N/V/D within the last 2 months?  No  Does the patient have any non-healing wounds?  No  Has the patient had any unintentional weight loss or weight gain?  No   Diabetes:  Is the patient diabetic?  Yes  If diabetic, was a CBG obtained today?  No  Did the patient bring in their glucometer from home?  No  How often do you monitor your CBG's? Every copy of days.   Financial Strains and Diabetes Management:  Are you having any financial strains with the device, your supplies or your medication? No .  Does the patient want to be seen by Chronic Care Management for management of their diabetes?  No  Would the patient like to be referred to a Nutritionist or for Diabetic Management?  No   Diabetic Exams:  Diabetic Eye Exam: Completed 12/15/2020 Diabetic Foot Exam: Completed 06/02/2020   Interpreter Needed?: No  Information entered by  :: NAllen LPN   Activities of Daily Living In your present state of health, do you have any difficulty performing the following activities: 12/26/2020  Hearing? N  Vision? N  Difficulty concentrating or making decisions? N  Walking or climbing stairs? N  Dressing or bathing? N  Doing errands, shopping? N  Preparing Food and eating ? N  Using the Toilet? N  In the past six months, have you accidently leaked urine? Y  Do you have problems with loss of bowel control? N  Managing your Medications? N  Managing your Finances? N  Housekeeping or managing your Housekeeping? N  Some recent data might be hidden    Patient Care Team: Olin Hauser, DO as PCP - General (Family Medicine) [place], Virl Diamond, [place] as Pharmacist Greg Cutter, LCSW as Social Worker (Licensed Clinical Social Worker) Minor, Dalbert Garnet, RN (Inactive) as [place] any recent [place] you may have received from other than [facility] providers in the past year (date may be approximate).     Assessment:   This is a routine wellness examination for Yvette [name].  Hearing/Vision screen No exam data present  Dietary issues and exercise activities discussed: Current Exercise Habits: Home exercise routine, Type of exercise: walking, Time (Minutes): 20, Frequency (Times/Week): 5, Weekly Exercise (Minutes/Week): 100  Goals    .  Patient Stated      12/26/2020, wants to weigh 195 pounds    .  SW- I need additional support and resource connection. I have little support since my spouse passed 4 years ago" (pt-stated)      Current Barriers:  . Acute Mental Health needs related to Anxiety and ongoing grief symptoms  . Financial constraints related to managing health care expenses and affording bills . Limited social support . Limited access to food . Level of care concerns . ADL IADL limitations . Limited access to caregiver . Inability to perform ADL's  independently . Inability to perform IADL's independently . Lacks knowledge of community resource: available food support resources  Clinical Social Work Goal(s):  Marland Kitchen Over the next 120 days, patient will work with SW bi-monthly by telephone or in person to reduce or manage symptoms related to  anxiety . Over the next 120 days, client will work with SW to address concerns related to gaining additional support/resources in order to improve self-care, physical health, emotional health and overall well-being.   Interventions: . Patient interviewed and appropriate assessments performed: brief mental health assessment . Patient just got back from a weekend trip with her son in law and daughter. She shares that she is implementing socialization into her daily routine as much as possible. She shares that she has a group of friends that she meets with 2 x per week to eat and socialize.  . Patient reports ongoing grief but DENIED wanting CCM LCSW to place grief therapy referral today on 10/17/20 . Provided patient with information about available financial and food support resources within the area. Patient is agreeable to MOW referral through [facility] contract. LCSW completed referral and asked for meals to be delivered for 60 days while she is on the wait list for services. UPDATE- Patient is no longer receiving meals as she declined being in need of this service anymore. Patient reports that she is actually cooking again and does not need meal prep assistance at this time. Patient reports that she has started cooking healthy meals each night for herself. . 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  . Discussed plans with patient for ongoing care management follow up and provided patient with direct contact information for care management team . Patient confirms healthy self-care implementation into her routine by eating healthy, drinking more fluids and stayting active.  Patient reports that her this has improved her mental health as well. . Patient was provided positive reinforcement for recent self-care and healthy depression management implementation.  . Assisted patient/caregiver with obtaining information about health plan benefits . Provided education and assistance to client regarding Advanced Directives. . Provided education to patient/caregiver regarding level of care options. . 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  . Patient reports that she is no longer able to get as much physical activity because her church is not allowing members to talk there like the use to. Patient shares that this was a great opportunity to socialize when she walked the church 23 laps as it equaled 1 mile.  . Patient shares that she has been managing her grief well. She states that a dear friend of hers lost their daughter and she was able to be a positive support for friend without it triggering her own grief (loss of spouse.)  . Yvette [name] confirms stable transportation to all future medical appointments. . Patient reports that the recent cholesterol medication that PCP put her on has started causing her leg weakness. She said that this very thing happened when she has tried other cholesterol medication. LCSW will update PCP.  Marland Kitchen LCSW spent extensive time educating patient on anxiety management coping skills and health self care . Psychoeducation and/or Health Education provided  Patient Self Care Activities:  . Attends all scheduled provider appointments . Calls provider office for new concerns or questions .  Please see past updates related to this goal by clicking on the "Past Updates" button in the selected goal        Depression Screen PHQ 2/9 Scores 12/26/2020 06/02/2020 04/07/2020 12/02/2019 11/30/2019 08/30/2019 05/28/2019  PHQ - 2 Score 0 0 0 0 0 0 0  PHQ- 9 Score - - 0  - - 0 -  Fall Risk Fall Risk  12/26/2020 06/02/2020 04/07/2020 12/02/2019 11/30/2019  Falls in the past year? 0 0 0 0 0  Number falls in past yr: - 0 - 0 0  Injury with Fall? - 0 - 0 0  Risk for fall due to : Medication side effect - No Fall Risks - -  Follow up Falls evaluation completed;Education provided;Falls prevention discussed Falls evaluation completed Falls evaluation completed Falls evaluation completed -    FALL RISK PREVENTION PERTAINING TO THE HOME:  Any stairs in or around the home? Yes  If so, are there any without handrails? Yes  Home free of loose throw rugs in walkways, pet beds, electrical cords, etc? Yes  Adequate lighting in your home to reduce risk of falls? Yes   ASSISTIVE DEVICES UTILIZED TO PREVENT FALLS:  Life alert? No  Use of a cane, walker or w/c? No  Grab bars in the bathroom? Yes  Shower chair or bench in shower? No  Elevated toilet seat or a handicapped toilet? Yes   TIMED UP AND GO:  Was the test performed? No .   Cognitive Function:     6CIT Screen 12/26/2020  What Year? 0 points  What month? 0 points  What time? 0 points  Count back from 20 0 points  Months in reverse 0 points  Repeat phrase 2 points  Total Score 2    Immunizations Immunization History  Administered Date(s) Administered  . Fluad Quad(high Dose 65+) 08/30/2019  . Influenza Inj Mdck Quad Pf 08/30/2019  . Influenza Split 07/07/2013  . Influenza, High Dose Seasonal PF 07/08/2017, 07/13/2018  . Influenza,inj,Quad PF,6+ Mos 06/07/2015, 08/15/2016  . Influenza-Unspecified 12/02/2020  . Pneumococcal Conjugate-13 12/21/2014  . Pneumococcal Polysaccharide-23 07/08/2017  . Zoster Recombinat (Shingrix) 02/13/2018, 04/16/2018    TDAP status: Up to date  Flu Vaccine status: Up to date  Pneumococcal vaccine status: Up to date  Covid-19 vaccine status: Declined, Education has been provided regarding the importance of this vaccine but patient still declined. Advised may  receive this vaccine at local pharmacy or Health Dept.or vaccine clinic. Aware to provide a copy of the vaccination record if obtained from local pharmacy or Health Dept. Verbalized acceptance and understanding.  Qualifies for Shingles Vaccine? Yes   Zostavax completed No   Shingrix Completed?: Yes  Screening Tests Health Maintenance  Topic Date Due  . COVID-19 Vaccine (1) Never done  . HEMOGLOBIN A1C  05/30/2021  . FOOT EXAM  06/02/2021  . OPHTHALMOLOGY EXAM  12/15/2021  . TETANUS/TDAP  03/08/2023  . INFLUENZA VACCINE  Completed  . DEXA SCAN  Completed  . Hepatitis C Screening  Completed  . PNA vac Low Risk Adult  Completed  . HPV VACCINES  Aged Out    Health Maintenance  Health Maintenance Due  Topic Date Due  . COVID-19 Vaccine (1) Never done    Colorectal cancer screening: No longer required.   Mammogram status: No longer required due to age.  Bone Density status: Completed 11/16/2012.   Lung Cancer Screening: (Low Dose CT Chest recommended if Age 10-80 years, 30 pack-year currently smoking OR have quit w/in 15years.) does not qualify.   Lung Cancer Screening Referral: no  Additional Screening:  Hepatitis C Screening: does qualify; Completed 06/25/2013  Vision Screening: Recommended annual ophthalmology exams for early detection of glaucoma and other disorders of the eye. Is the patient up to date with their annual eye exam?  Yes  Who is the provider or what is the name  of the office in which the patient attends annual eye exams? [facility] If pt is not established with a provider, would they like to be referred to a provider to establish care? No .   Dental Screening: Recommended annual dental exams for proper oral hygiene  Community Resource Referral / Chronic Care Management: CRR required this visit?  No   CCM required this visit?  No      Plan:     I have personally reviewed and noted the following in the patient's chart:   . Medical and  social history . Use of alcohol, tobacco or illicit drugs  . Current medications and supplements . Functional ability and status . Nutritional status . Physical activity . Advanced directives . List of other physicians . Hospitalizations, surgeries, and ER visits in previous 12 months . Vitals . Screenings to include cognitive, depression, and falls . Referrals and appointments  In addition, I have reviewed and discussed with patient certain preventive protocols, quality metrics, and best practice recommendations. A written personalized care plan for preventive services as well as general preventive health recommendations were provided to patient.     Kellie Simmering, LPN   [id number]   Nurse Notes:

## 2020-12-26 NOTE — Patient Instructions (Signed)
Ms. Yvette [name] , Thank you for taking time to come for your Medicare Wellness Visit. I appreciate your ongoing commitment to your health goals. Please review the following plan we discussed and let me know if I can assist you in the future.   Screening recommendations/referrals: Colonoscopy: not required Mammogram: not required Bone Density: completed 11/16/2012 Recommended yearly ophthalmology/optometry visit for glaucoma screening and checkup Recommended yearly dental visit for hygiene and checkup  Vaccinations: Influenza vaccine: completed 12/02/2020, due 05/07/2021 Pneumococcal vaccine: completed 07/08/2017 Tdap vaccine: completed 03/07/2013, due 03/08/2023 Shingles vaccine: completed   Covid-19: decline  Advanced directives: Please bring a copy of your POA (Power of [place]) and/or Living Will to your next appointment.   Conditions/risks identified: none  Next appointment: Follow up in one year for your annual wellness visit    Preventive Care 65 Years and Older, Female Preventive care refers to lifestyle choices and visits with your health care provider that can promote health and wellness. What does preventive care include?  A yearly physical exam. This is also called an annual well check.  Dental exams once or twice a year.  Routine eye exams. Ask your health care provider how often you should have your eyes checked.  Personal lifestyle choices, including:  Daily care of your teeth and gums.  Regular physical activity.  Eating a healthy diet.  Avoiding tobacco and drug use.  Limiting alcohol use.  Practicing safe sex.  Taking low-dose aspirin every day.  Taking vitamin and mineral supplements as recommended by your health care provider. What happens during an annual well check? The services and screenings done by your health care provider during your annual well check will depend on your age, overall health, lifestyle risk factors, and family history of  disease. Counseling  Your health care provider may ask you questions about your:  Alcohol use.  Tobacco use.  Drug use.  Emotional well-being.  Home and relationship well-being.  Sexual activity.  Eating habits.  History of falls.  Memory and ability to understand (cognition).  Work and work Statistician.  Reproductive health. Screening  You may have the following tests or measurements:  Height, weight, and BMI.  Blood pressure.  Lipid and cholesterol levels. These may be checked every 5 years, or more frequently if you are over 85 years old.  Skin check.  Lung cancer screening. You may have this screening every year starting at age 56 if you have a 30-pack-year history of smoking and currently smoke or have quit within the past 15 years.  Fecal occult blood test (FOBT) of the stool. You may have this test every year starting at age 80.  Flexible sigmoidoscopy or colonoscopy. You may have a sigmoidoscopy every 5 years or a colonoscopy every 10 years starting at age 26.  Hepatitis C blood test.  Hepatitis B blood test.  Sexually transmitted disease (STD) testing.  Diabetes screening. This is done by checking your blood sugar (glucose) after you have not eaten for a while (fasting). You may have this done every 1-3 years.  Bone density scan. This is done to screen for osteoporosis. You may have this done starting at age 48.  Mammogram. This may be done every 1-2 years. Talk to your health care provider about how often you should have regular mammograms. Talk with your health care provider about your test results, treatment options, and if necessary, the need for more tests. Vaccines  Your health care provider may recommend certain vaccines, such as:  Influenza vaccine. This  is recommended every year.  Tetanus, diphtheria, and acellular pertussis (Tdap, Td) vaccine. You may need a Td booster every 10 years.  Zoster vaccine. You may need this after age  83.  Pneumococcal 13-valent conjugate (PCV13) vaccine. One dose is recommended after age 49.  Pneumococcal polysaccharide (PPSV23) vaccine. One dose is recommended after age 21. Talk to your health care provider about which screenings and vaccines you need and how often you need them. This information is not intended to replace advice given to you by your health care provider. Make sure you discuss any questions you have with your health care provider. Document Released: 10/20/2015 Document Revised: 06/12/2016 Document Reviewed: 07/25/2015 Elsevier Interactive Patient Education  2017 [place] Prevention in the Home Falls can cause injuries. They can happen to people of all ages. There are many things you can do to make your home safe and to help prevent falls. What can I do on the outside of my home?  Regularly fix the edges of walkways and driveways and fix any cracks.  Remove anything that might make you trip as you walk through a door, such as a raised step or threshold.  Trim any bushes or trees on the path to your home.  Use bright outdoor lighting.  Clear any walking paths of anything that might make someone trip, such as rocks or tools.  Regularly check to see if handrails are loose or broken. Make sure that both sides of any steps have handrails.  Any raised decks and porches should have guardrails on the edges.  Have any leaves, snow, or ice cleared regularly.  Use sand or salt on walking paths during winter.  Clean up any spills in your garage right away. This includes oil or grease spills. What can I do in the bathroom?  Use night lights.  Install grab bars by the toilet and in the tub and shower. Do not use towel bars as grab bars.  Use non-skid mats or decals in the tub or shower.  If you need to sit down in the shower, use a plastic, non-slip stool.  Keep the floor dry. Clean up any water that spills on the floor as soon as it happens.  Remove  soap buildup in the tub or shower regularly.  Attach bath mats securely with double-sided non-slip rug tape.  Do not have throw rugs and other things on the floor that can make you trip. What can I do in the bedroom?  Use night lights.  Make sure that you have a light by your bed that is easy to reach.  Do not use any sheets or blankets that are too big for your bed. They should not hang down onto the floor.  Have a firm chair that has side arms. You can use this for support while you get dressed.  Do not have throw rugs and other things on the floor that can make you trip. What can I do in the kitchen?  Clean up any spills right away.  Avoid walking on wet floors.  Keep items that you use a lot in easy-to-reach places.  If you need to reach something above you, use a strong step stool that has a grab bar.  Keep electrical cords out of the way.  Do not use floor polish or wax that makes floors slippery. If you must use wax, use non-skid floor wax.  Do not have throw rugs and other things on the floor that can make you  trip. What can I do with my stairs?  Do not leave any items on the stairs.  Make sure that there are handrails on both sides of the stairs and use them. Fix handrails that are broken or loose. Make sure that handrails are as long as the stairways.  Check any carpeting to make sure that it is firmly attached to the stairs. Fix any carpet that is loose or worn.  Avoid having throw rugs at the top or bottom of the stairs. If you do have throw rugs, attach them to the floor with carpet tape.  Make sure that you have a light switch at the top of the stairs and the bottom of the stairs. If you do not have them, ask someone to add them for you. What else can I do to help prevent falls?  Wear shoes that:  Do not have high heels.  Have rubber bottoms.  Are comfortable and fit you well.  Are closed at the toe. Do not wear sandals.  If you use a  stepladder:  Make sure that it is fully opened. Do not climb a closed stepladder.  Make sure that both sides of the stepladder are locked into place.  Ask someone to hold it for you, if possible.  Clearly mark and make sure that you can see:  Any grab bars or handrails.  First and last steps.  Where the edge of each step is.  Use tools that help you move around (mobility aids) if they are needed. These include:  Canes.  Walkers.  Scooters.  Crutches.  Turn on the lights when you go into a dark area. Replace any light bulbs as soon as they burn out.  Set up your furniture so you have a clear path. Avoid moving your furniture around.  If any of your floors are uneven, fix them.  If there are any pets around you, be aware of where they are.  Review your medicines with your doctor. Some medicines can make you feel dizzy. This can increase your chance of falling. Ask your doctor what other things that you can do to help prevent falls. This information is not intended to replace advice given to you by your health care provider. Make sure you discuss any questions you have with your health care provider. Document Released: 07/20/2009 Document Revised: 02/29/2016 Document Reviewed: 10/28/2014 Elsevier Interactive Patient Education  2017 [employer].

## 2021-01-03 ENCOUNTER — Other Ambulatory Visit: Payer: Self-pay | Admitting: Family Medicine

## 2021-01-03 DIAGNOSIS — K219 Gastro-esophageal reflux disease without esophagitis: Secondary | ICD-10-CM

## 2021-01-03 DIAGNOSIS — M5441 Lumbago with sciatica, right side: Secondary | ICD-10-CM

## 2021-01-03 DIAGNOSIS — G8929 Other chronic pain: Secondary | ICD-10-CM

## 2021-01-03 DIAGNOSIS — M47816 Spondylosis without myelopathy or radiculopathy, lumbar region: Secondary | ICD-10-CM

## 2021-01-08 DIAGNOSIS — E119 Type 2 diabetes mellitus without complications: Secondary | ICD-10-CM | POA: Diagnosis not present

## 2021-01-08 DIAGNOSIS — E785 Hyperlipidemia, unspecified: Secondary | ICD-10-CM | POA: Diagnosis not present

## 2021-01-08 DIAGNOSIS — K219 Gastro-esophageal reflux disease without esophagitis: Secondary | ICD-10-CM | POA: Diagnosis not present

## 2021-01-08 DIAGNOSIS — Z791 Long term (current) use of non-steroidal anti-inflammatories (NSAID): Secondary | ICD-10-CM | POA: Diagnosis not present

## 2021-01-08 DIAGNOSIS — M1990 Unspecified osteoarthritis, unspecified site: Secondary | ICD-10-CM | POA: Diagnosis not present

## 2021-01-08 DIAGNOSIS — R2241 Localized swelling, mass and lump, right lower limb: Secondary | ICD-10-CM | POA: Diagnosis not present

## 2021-01-08 DIAGNOSIS — M7989 Other specified soft tissue disorders: Secondary | ICD-10-CM | POA: Diagnosis not present

## 2021-01-08 DIAGNOSIS — D1723 Benign lipomatous neoplasm of skin and subcutaneous tissue of right leg: Secondary | ICD-10-CM | POA: Diagnosis not present

## 2021-01-08 DIAGNOSIS — E669 Obesity, unspecified: Secondary | ICD-10-CM | POA: Diagnosis not present

## 2021-01-08 DIAGNOSIS — I10 Essential (primary) hypertension: Secondary | ICD-10-CM | POA: Diagnosis not present

## 2021-01-16 ENCOUNTER — Ambulatory Visit (INDEPENDENT_AMBULATORY_CARE_PROVIDER_SITE_OTHER): Payer: Medicare HMO | Admitting: Licensed Clinical Social Worker

## 2021-01-16 ENCOUNTER — Telehealth: Payer: Self-pay | Admitting: Licensed Clinical Social Worker

## 2021-01-16 DIAGNOSIS — E785 Hyperlipidemia, unspecified: Secondary | ICD-10-CM

## 2021-01-16 DIAGNOSIS — E1169 Type 2 diabetes mellitus with other specified complication: Secondary | ICD-10-CM | POA: Diagnosis not present

## 2021-01-16 DIAGNOSIS — I10 Essential (primary) hypertension: Secondary | ICD-10-CM | POA: Diagnosis not present

## 2021-01-16 DIAGNOSIS — F419 Anxiety disorder, unspecified: Secondary | ICD-10-CM

## 2021-01-16 DIAGNOSIS — F411 Generalized anxiety disorder: Secondary | ICD-10-CM

## 2021-01-16 NOTE — Chronic Care Management (AMB) (Signed)
Chronic Care Management    Clinical Social Work Note  01/16/2021 Name: Yvette [name] MRN: [id number] DOB: 06-Mar-1946  Yvette [name] is a 75 y.o. year old female who is a primary care patient of Olin Hauser, DO. The CCM team was consulted to assist the patient with chronic disease management and/or care coordination needs related to: Grief Counseling.   Engaged with patient by telephone for initial visit in response to provider referral for social work chronic care management and care coordination services.   Consent to Services:  The patient was given the following information about Chronic Care Management services today, agreed to services, and gave verbal consent: 1. CCM service includes personalized support from designated clinical staff supervised by the primary care provider, including individualized plan of care and coordination with other care providers 2. 24/7 contact phone numbers for assistance for urgent and routine care needs. 3. Service will only be billed when office clinical staff spend 20 minutes or more in a month to coordinate care. 4. Only one practitioner may furnish and bill the service in a calendar month. 5.The patient may stop CCM services at any time (effective at the end of the month) by phone call to the office staff. 6. The patient will be responsible for cost sharing (co-pay) of up to 20% of the service fee (after annual deductible is met). Patient agreed to services and consent obtained.  Patient agreed to services and consent obtained.   Assessment: Review of patient past medical history, allergies, medications, and health status, including review of relevant consultants reports was performed today as part of a comprehensive evaluation and provision of chronic care management and care coordination services.     SDOH (Social Determinants of Health) assessments and interventions performed:    Advanced Directives Status: See Care Plan for related  entries.  CCM Care Plan  Allergies  Allergen Reactions  . Simvastatin Other (See Comments)    Myalgia, muscle aches lower extremity  . Erythromycin Nausea And Vomiting    Outpatient Encounter Medications as of 01/16/2021  Medication Sig Note  . Acetaminophen (TYLENOL 8 HOUR ARTHRITIS PAIN PO) Take by mouth. 2 in am 1 in pm   . albuterol (VENTOLIN HFA) 108 (90 Base) MCG/ACT inhaler Inhale 2 puffs into the lungs every 4 (four) hours as needed for wheezing or shortness of breath (cough). (Patient not taking: Reported on 12/26/2020)   . amLODipine (NORVASC) 10 MG tablet Take 1 tablet (10 mg total) by mouth daily.   Marland Kitchen aspirin 81 MG tablet Take 81 mg by mouth daily.   . beta carotene w/minerals (OCUVITE) tablet Take 1 tablet by mouth daily.   . Calcium-Phosphorus-Vitamin D (CITRACAL +D3 PO) Take 1 Dose by mouth 2 (two) times daily.   . Cetirizine HCl 10 MG CAPS Take by mouth.   Marland Kitchen Co-Enzyme Q-10 100 MG CAPS Take 1 capsule by mouth daily.   . fluticasone (FLONASE) 50 MCG/ACT nasal spray Place 2 sprays into both nostrils daily. 11/30/2019: Nightly   . furosemide (LASIX) 20 MG tablet Take 1-2 tablets (20-40 mg total) by mouth daily as needed for fluid or edema. Recommend use up to 3-5 days at a time if needed. (Patient not taking: Reported on 12/26/2020)   . gabapentin (NEURONTIN) 100 MG capsule Take 2 capsules (200 mg total) by mouth at bedtime.   . hydrochlorothiazide (HYDRODIURIL) 25 MG tablet Take 1 tablet (25 mg total) by mouth daily. (Patient not taking: Reported on 12/26/2020)   . irbesartan (  AVAPRO) 300 MG tablet Take 1 tablet (300 mg total) by mouth daily. May start with half tab daily for 1-2 weeks then increase to 1 whole tab daily   . Krill Oil 300 MG CAPS Take by mouth daily.   . Mag Aspart-Potassium Aspart (POTASSIUM & MAGNESIUM ASPARTAT PO) Take 1 tablet by mouth daily. (Patient not taking: Reported on 12/26/2020)   . metFORMIN (GLUCOPHAGE) 500 MG tablet Take 1 tablet (500 mg total) by  mouth 2 (two) times daily with a meal.   . Multiple Vitamin (MULTIVITAMIN) tablet Take 1 tablet by mouth daily.   . naproxen (NAPROSYN) 500 MG tablet Take 1 tablet (500 mg total) by mouth 2 (two) times daily with a meal. For 1-2 weeks then as needed   . pantoprazole (PROTONIX) 40 MG tablet Take 1 tablet (40 mg total) by mouth daily before breakfast.   . [place] 420 MG/3.5ML SOCT Inject 420 mg into the skin every 30 (thirty) days.    No facility-administered encounter medications on file as of 01/16/2021.    Patient Active Problem List   Diagnosis Date Noted  . Familial hyperlipidemia 12/07/2020  . 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 Abscess 04/07/2020  . Venous insufficiency of both lower extremities 12/01/2019  . Suspected sleep apnea 07/13/2018  . Former smoker 07/13/2018  . Multiple renal cysts 11/12/2017  . Vitamin D deficiency 10/15/2017  . Spondylosis of lumbar region without myelopathy or radiculopathy 06/10/2017  . Chronic right-sided low back pain with sciatica 06/10/2017  . Primary osteoarthritis involving multiple joints 06/10/2017  . BPPV (benign paroxysmal positional vertigo), right 06/10/2017  . Nausea without vomiting 06/10/2017  . Environmental and seasonal allergies 02/12/2017  . 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/2017  . Morbid obesity ([place]) 02/12/2017  . 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  . Dupuytren's contracture of left hand 10/14/2016  . Anxiety disorder 09/03/2016  . GERD (gastroesophageal reflux disease) 09/03/2016  . Prophylactic use of raloxifene ([place]) 09/03/2016  . Hyperlipidemia associated with type 2 diabetes mellitus ([place]) 09/03/2016  . Type 2 diabetes mellitus with other specified complication ([place]) 03/88/8280  . Dyspnea 07/23/2013  . Essential hypertension     Conditions to be addressed/monitored: Anxiety; Mental Health Concerns  and Social Isolation  Care Plan : General Social Work (Adult)  Updates made by Greg Cutter, LCSW  since 01/16/2021 12:00 AM    Problem: Coping Skills (General Plan of Care)     Long-Range Goal: Coping Skills Enhanced   Start Date: 01/16/2021  Recent Progress: On track  Note:   Timeframe:  Long-Range Goal Priority:  Medium Start Date:   01/16/21                      Expected End Date:  04/17/21                  Follow Up Date- 04/17/21  Current Barriers:  . Acute Mental Health needs related to Anxiety and ongoing grief symptoms  . Financial constraints related to managing health care expenses and affording bills . Limited social support . Limited access to food . Level of care concerns . ADL IADL limitations . Limited access to caregiver . Inability to perform ADL's independently . Inability to perform IADL's independently . Lacks knowledge of community resource: available food support resources  Clinical Social Work Goal(s):  Marland Kitchen Over the next 120 days, patient will work with SW bi-monthly by telephone or in person to reduce or manage symptoms related to anxiety . Over  the next 120 days, client will work with SW to address concerns related to gaining additional support/resources in order to improve self-care, physical health, emotional health and overall well-being.   Interventions: . Patient interviewed and appropriate assessments performed: brief mental health assessment . Past update-Patient has a sebaceous cyst between her shoulder blades that started on 11/06/20. She went to the ED for this concern on 11/08/20. Patient is still trying to get rid of this infection it has caused. Her dermatologist relocated to [facility] and she is waiting on call back from her insurance to make sure that if she completes a visit there, that it would be approved. Patient is taking an antibiotic but feels that the infection is still present. . Patient was informed that current CCM LCSW will be leaving position next month and her next CCM Social Work follow up visit will be with another LCSW. Patient  was appreciative of support provided and receptive to news . She shares that she is implementing socialization into her daily routine as much as possible. She shares that she has a group of friends that she meets with 2 x per week to eat and socialize.  . Patient reports ongoing grief but DENIED wanting CCM LCSW to place grief therapy referral again on 01/16/21. Marland Kitchen Provided patient with information about available financial and food support resources within the area. Patient is agreeable to MOW referral through [facility] contract. LCSW completed referral and asked for meals to be delivered for 60 days while she is on the wait list for services. UPDATE- Patient is no longer receiving meals as she declined being in need of this service anymore. Patient reports that she is actually cooking again and does not need meal prep assistance at this time. Patient reports that she has started cooking healthy meals each night for herself. . 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 Patient was informed that they have excess volunteers at this time but will contact her if they need her for services.  . Discussed plans with patient for ongoing care management follow up and provided patient with direct contact information for care management team . Patient confirms healthy self-care implementation into her routine by eating healthy, drinking more fluids and stayting active. Patient reports that her this has improved her mental health as well. . Patient was provided positive reinforcement for recent self-care and healthy depression management implementation.  . Assisted patient/caregiver with obtaining information about health plan benefits . Provided education and assistance to client regarding Advanced Directives. . Provided education to patient/caregiver regarding level of care options. . 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  . Patient reports that she has been working effectively on her weight loss and has gone form 225 to 212 lbs. Patient is wanting to continue this weight loss journey and is hopeful it will result in a good report from her physician on her next PCP visit on 04/16/21.  . Patient shares that she has been managing her grief well. She states that a dear friend of hers lost their daughter and she was able to be a positive support for friend without it triggering her own grief (loss of spouse.)  . Malya confirms stable transportation to all future medical appointments. . Patient reports that the recent cholesterol medication that PCP put her on has started causing her leg weakness. She said that this very thing happened when  she has tried other cholesterol medication. LCSW will update PCP.  Marland Kitchen Patient continues to watch her church service on her ipad device but is hoping to eventually get back to church in person.  . Patient had a recent surgery on 01/08/21. Per chart, procedure was called excision mass from the right hip and pelvis area. Patient had her stitches removed today on 01/16/21. Marland Kitchen LCSW spent extensive time educating patient on anxiety management coping skills and health self care . Psychoeducation and/or Health Education provided  Patient Self Care Activities:  . Attends all scheduled provider appointments . Calls provider office for new concerns or questions .  Please see past updates related to this goal by clicking on the "Past Updates" button in the selected goal       Follow Up Plan: SW will follow up with patient by phone over the next quarter      [name], [place], MSW, [email or web address] Phone: [phone]

## 2021-01-16 NOTE — Telephone Encounter (Signed)
Chronic Care Management    Clinical Social Work General Follow Up Note  01/16/2021 Name: CHRISTINE MORTON MRN: [id number] DOB: [birth year]  TERRIA DESCHEPPER is a 75 y.o. year old female who is a primary care patient of Olin Hauser, DO. The CCM team was consulted for assistance with [employer] .   Review of patient status, including review of consultants reports, relevant laboratory and other test results, and collaboration with appropriate care team members and the patient's provider was performed as part of comprehensive patient evaluation and provision of chronic care management services.    LCSW completed CCM outreach attempt today but was unable to reach patient successfully. A HIPPA compliant voice message was left encouraging patient to return call once available. LCSW will ask Scheduling Care Guide to reschedule CCM SW appointment with patient as well.  Outpatient Encounter Medications as of 01/16/2021  Medication Sig Note  . Acetaminophen (TYLENOL 8 HOUR ARTHRITIS PAIN PO) Take by mouth. 2 in am 1 in pm   . albuterol (VENTOLIN HFA) 108 (90 Base) MCG/ACT inhaler Inhale 2 puffs into the lungs every 4 (four) hours as needed for wheezing or shortness of breath (cough). (Patient not taking: Reported on 12/26/2020)   . amLODipine (NORVASC) 10 MG tablet Take 1 tablet (10 mg total) by mouth daily.   Marland Kitchen aspirin 81 MG tablet Take 81 mg by mouth daily.   . beta carotene w/minerals (OCUVITE) tablet Take 1 tablet by mouth daily.   . Calcium-Phosphorus-Vitamin D (CITRACAL +D3 PO) Take 1 Dose by mouth 2 (two) times daily.   . Cetirizine HCl 10 MG CAPS Take by mouth.   Marland Kitchen Co-Enzyme Q-10 100 MG CAPS Take 1 capsule by mouth daily.   . fluticasone (FLONASE) 50 MCG/ACT nasal spray Place 2 sprays into both nostrils daily. 11/30/2019: Nightly   . furosemide (LASIX) 20 MG tablet Take 1-2 tablets (20-40 mg total) by mouth daily as needed for fluid or edema. Recommend use up to 3-5 days at a time  if needed. (Patient not taking: Reported on 12/26/2020)   . gabapentin (NEURONTIN) 100 MG capsule Take 2 capsules (200 mg total) by mouth at bedtime.   . hydrochlorothiazide (HYDRODIURIL) 25 MG tablet Take 1 tablet (25 mg total) by mouth daily. (Patient not taking: Reported on 12/26/2020)   . irbesartan (AVAPRO) 300 MG tablet Take 1 tablet (300 mg total) by mouth daily. May start with half tab daily for 1-2 weeks then increase to 1 whole tab daily   . Krill Oil 300 MG CAPS Take by mouth daily.   . Mag Aspart-Potassium Aspart (POTASSIUM & MAGNESIUM ASPARTAT PO) Take 1 tablet by mouth daily. (Patient not taking: Reported on 12/26/2020)   . metFORMIN (GLUCOPHAGE) 500 MG tablet Take 1 tablet (500 mg total) by mouth 2 (two) times daily with a meal.   . Multiple Vitamin (MULTIVITAMIN) tablet Take 1 tablet by mouth daily.   . naproxen (NAPROSYN) 500 MG tablet Take 1 tablet (500 mg total) by mouth 2 (two) times daily with a meal. For 1-2 weeks then as needed   . pantoprazole (PROTONIX) 40 MG tablet Take 1 tablet (40 mg total) by mouth daily before breakfast.   . [place] 420 MG/3.5ML SOCT Inject 420 mg into the skin every 30 (thirty) days.    No facility-administered encounter medications on file as of 01/16/2021.    Follow Up Plan: [place] will reach out to patient to reschedule appointment.   [name], BSW,  MSW, [email or web address] Phone: [phone]

## 2021-01-16 NOTE — Patient Instructions (Signed)
Licensed Clinical Social Worker Visit Information  Goals we discussed today:  Goals Addressed              This Visit's Progress   .  SW- I need additional support and resource connection. I have little support since my spouse passed 4 years ago" (pt-stated)        Timeframe:  Long-Range Goal Priority:  Medium Start Date:   01/16/21                      Expected End Date:  04/17/21                  Follow Up Date- 04/17/21  Current Barriers:  . Acute Mental Health needs related to Anxiety and ongoing grief symptoms  . Financial constraints related to managing health care expenses and affording bills . Limited social support . Limited access to food . Level of care concerns . ADL IADL limitations . Limited access to caregiver . Inability to perform ADL's independently . Inability to perform IADL's independently . Lacks knowledge of community resource: available food support resources  Clinical Social Work Goal(s):  Yvette [name] Over the next 120 days, patient will work with SW bi-monthly by telephone or in person to reduce or manage symptoms related to anxiety . Over the next 120 days, client will work with SW to address concerns related to gaining additional support/resources in order to improve self-care, physical health, emotional health and overall well-being.   Interventions: . Patient interviewed and appropriate assessments performed: brief mental health assessment . Past update-Patient has a sebaceous cyst between her shoulder blades that started on 11/06/20. She went to the ED for this concern on 11/08/20. Patient is still trying to get rid of this infection it has caused. Her dermatologist relocated to [facility] and she is waiting on call back from her insurance to make sure that if she completes a visit there, that it would be approved. Patient is taking an antibiotic but feels that the infection is still present. . Patient was informed that current CCM LCSW will be leaving position  next month and her next CCM Social Work follow up visit will be with another LCSW. Patient was appreciative of support provided and receptive to news . She shares that she is implementing socialization into her daily routine as much as possible. She shares that she has a group of friends that she meets with 2 x per week to eat and socialize.  . Patient reports ongoing grief but DENIED wanting CCM LCSW to place grief therapy referral again on 01/16/21. Yvette [name] Provided patient with information about available financial and food support resources within the area. Patient is agreeable to MOW referral through [facility] contract. LCSW completed referral and asked for meals to be delivered for 60 days while she is on the wait list for services. UPDATE- Patient is no longer receiving meals as she declined being in need of this service anymore. Patient reports that she is actually cooking again and does not need meal prep assistance at this time. Patient reports that she has started cooking healthy meals each night for herself. . 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 Patient was informed that they have excess volunteers at this time but will contact her if they need her for services.  . Discussed plans with patient for ongoing care management follow up and provided patient with direct contact information for care management team . Patient  confirms healthy self-care implementation into her routine by eating healthy, drinking more fluids and stayting active. Patient reports that her this has improved her mental health as well. . Patient was provided positive reinforcement for recent self-care and healthy depression management implementation.  . Assisted patient/caregiver with obtaining information about health plan benefits . Provided education and assistance to client regarding Advanced Directives. . Provided education to patient/caregiver regarding level of care options. . 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  . Patient reports that she has been working effectively on her weight loss and has gone form 225 to 212 lbs. Patient is wanting to continue this weight loss journey and is hopeful it will result in a good report from her physician on her next PCP visit on 04/16/21.  . Patient shares that she has been managing her grief well. She states that a dear friend of hers lost their daughter and she was able to be a positive support for friend without it triggering her own grief (loss of spouse.)  . [name] confirms stable transportation to all future medical appointments. . Patient reports that the recent cholesterol medication that PCP put her on has started causing her leg weakness. She said that this very thing happened when she has tried other cholesterol medication. LCSW will update PCP.  Yvette [name] Patient continues to watch her church service on her ipad device but is hoping to eventually get back to church in person.  . Patient had a recent surgery on 01/08/21. Per chart, procedure was called excision mass from the right hip and pelvis area. Patient had her stitches removed today on 01/16/21. Yvette Kitchen LCSW spent extensive time educating patient on anxiety management coping skills and health self care . Psychoeducation and/or Health Education provided  Patient Self Care Activities:  . Attends all scheduled provider appointments . Calls provider office for new concerns or questions .  Please see past updates related to this goal by clicking on the "Past Updates" button in the selected goal        [name], BSW, MSW, [name] Phone: [phone]

## 2021-02-09 ENCOUNTER — Other Ambulatory Visit: Payer: Self-pay | Admitting: Family Medicine

## 2021-02-09 DIAGNOSIS — G8929 Other chronic pain: Secondary | ICD-10-CM

## 2021-02-09 DIAGNOSIS — M5441 Lumbago with sciatica, right side: Secondary | ICD-10-CM

## 2021-02-09 DIAGNOSIS — M47816 Spondylosis without myelopathy or radiculopathy, lumbar region: Secondary | ICD-10-CM

## 2021-03-15 ENCOUNTER — Other Ambulatory Visit: Payer: Self-pay

## 2021-03-15 ENCOUNTER — Ambulatory Visit (INDEPENDENT_AMBULATORY_CARE_PROVIDER_SITE_OTHER): Payer: Medicare HMO | Admitting: Family Medicine

## 2021-03-15 ENCOUNTER — Encounter: Payer: Self-pay | Admitting: Family Medicine

## 2021-03-15 VITALS — Ht 66.0 in | Wt 220.0 lb

## 2021-03-15 DIAGNOSIS — J0110 Acute frontal sinusitis, unspecified: Secondary | ICD-10-CM | POA: Diagnosis not present

## 2021-03-15 MED ORDER — IPRATROPIUM BROMIDE 0.06 % NA SOLN: 2.0000 | Freq: Four times a day (QID) | NASAL | 0 refills | Status: DC

## 2021-03-15 MED ORDER — AMOXICILLIN-POT CLAVULANATE 875-125 MG PO TABS: 1.0000 | ORAL_TABLET | Freq: Two times a day (BID) | ORAL | 0 refills | Status: DC

## 2021-03-15 NOTE — Patient Instructions (Signed)
° °  Please schedule a Follow-up Appointment to: No follow-ups on file. ° °If you have any other questions or concerns, please feel free to call the office or send a message through MyChart. You may also schedule an earlier appointment if necessary. ° °Additionally, you may be receiving a survey about your experience at our office within a few days to 1 week by e-mail or mail. We value your feedback. ° °Briya Lookabaugh, DO °[facility], [facility] °

## 2021-03-15 NOTE — Progress Notes (Signed)
Virtual Visit via Telephone The purpose of this virtual visit is to provide medical care while limiting exposure to the novel coronavirus (COVID19) for both patient and office staff.  Consent was obtained for phone visit:  Yes.   Answered questions that patient had about telehealth interaction:  Yes.   I discussed the limitations, risks, security and privacy concerns of performing an evaluation and management service by telephone. I also discussed with the patient that there may be a patient responsible charge related to this service. The patient expressed understanding and agreed to proceed.  Patient Location: Home Provider Location: [facility] (Office)  Participants in virtual visit: - Patient: Yvette [name] - CMA: Orinda Kenner, [place] N[place] - Provider: Dr Parks Ranger  ---------------------------------------------------------------------- Chief Complaint  Patient presents with   Sinus Problem   Nasal Congestion   Sore Throat    S: Reviewed CMA documentation. I have called patient and gathered additional HPI as follows:  Sinusitis Reports that symptoms started onset 2-3 days ago with nasal congestion drainage, then worsening now today with sinus pain and pressure. She went on a boat ride over weekend and then she was exposed to significant furniture with dust. Today overall improved today but still has the worse pressure. Admits sneezing, rhinorrhea. Admits occasional post nasal drainage causing cough sometimes - Tried remedy hot tea - Continues Loratadine 10mg  daily, Fluticasone daily  1 week ago she did at home COVID test negative due to visiting family, she was asymptomatic.  Denies any known or suspected exposure to person with or possibly with COVID19.  Denies any fevers, chills, sweats, body ache, cough, shortness of breath, headache, abdominal pain, diarrhea  Past Medical History:  Diagnosis Date   Arthropathy, unspecified, site unspecified    Esophageal  reflux    Osteoarthritis    Osteoporosis, unspecified    Other abnormal glucose    Other and unspecified hyperlipidemia    Symptomatic menopausal or female climacteric states    Syncope and collapse    Social History   Tobacco Use   Smoking status: Former    Packs/day: 1.00    Years: 26.00    Pack years: 26.00    Types: Cigarettes    Quit date: 06/07/1992    Years since quitting: 28.7   Smokeless tobacco: Former  Scientific laboratory technician Use: Never used  Substance Use Topics   Alcohol use: Not Currently    Alcohol/week: 0.0 standard drinks    Comment: occassional   Drug use: No    Current Outpatient Medications:    Acetaminophen (TYLENOL 8 HOUR ARTHRITIS PAIN PO), Take by mouth. 2 in am 1 in pm, Disp: , Rfl:    amLODipine (NORVASC) 10 MG tablet, Take 1 tablet (10 mg total) by mouth daily., Disp: 90 tablet, Rfl: 3   aspirin 81 MG tablet, Take 81 mg by mouth daily., Disp: , Rfl:    beta carotene w/minerals (OCUVITE) tablet, Take 1 tablet by mouth daily., Disp: , Rfl:    Calcium-Phosphorus-Vitamin D (CITRACAL +D3 PO), Take 1 Dose by mouth 2 (two) times daily., Disp: , Rfl:    Co-Enzyme Q-10 100 MG CAPS, Take 1 capsule by mouth daily., Disp: , Rfl:    fluticasone (FLONASE) 50 MCG/ACT nasal spray, Place 2 sprays into both nostrils daily., Disp: 16 g, Rfl: 5   gabapentin (NEURONTIN) 100 MG capsule, Take 2 capsules (200 mg total) by mouth at bedtime., Disp: 180 capsule, Rfl: 3   irbesartan (AVAPRO) 300 MG tablet,  Take 1 tablet (300 mg total) by mouth daily. May start with half tab daily for 1-2 weeks then increase to 1 whole tab daily, Disp: 90 tablet, Rfl: 3   Krill Oil 300 MG CAPS, Take by mouth daily., Disp: , Rfl:    loratadine (CLARITIN) 10 MG tablet, Take 10 mg by mouth daily., Disp: , Rfl:    metFORMIN (GLUCOPHAGE) 500 MG tablet, Take 1 tablet (500 mg total) by mouth 2 (two) times daily with a meal., Disp: 180 tablet, Rfl: 3   Multiple Vitamin (MULTIVITAMIN) tablet, Take 1 tablet by  mouth daily., Disp: , Rfl:    naproxen (NAPROSYN) 500 MG tablet, Take 1 tablet (500 mg total) by mouth 2 (two) times daily with a meal. For 1-2 weeks then as needed, Disp: 60 tablet, Rfl: 0   pantoprazole (PROTONIX) 40 MG tablet, Take 1 tablet (40 mg total) by mouth daily before breakfast., Disp: 90 tablet, Rfl: 0   REPATHA PUSHTRONEX SYSTEM 420 MG/3.5ML SOCT, Inject 420 mg into the skin every 30 (thirty) days., Disp: 3.5 mL, Rfl: 11   albuterol (VENTOLIN HFA) 108 (90 Base) MCG/ACT inhaler, Inhale 2 puffs into the lungs every 4 (four) hours as needed for wheezing or shortness of breath (cough). (Patient not taking: No sig reported), Disp: 6.7 g, Rfl: 2   furosemide (LASIX) 20 MG tablet, Take 1-2 tablets (20-40 mg total) by mouth daily as needed for fluid or edema. Recommend use up to 3-5 days at a time if needed. (Patient not taking: No sig reported), Disp: 30 tablet, Rfl: 2   hydrochlorothiazide (HYDRODIURIL) 25 MG tablet, Take 1 tablet (25 mg total) by mouth daily. (Patient not taking: No sig reported), Disp: 90 tablet, Rfl: 3   Mag Aspart-Potassium Aspart (POTASSIUM & MAGNESIUM ASPARTAT PO), Take 1 tablet by mouth daily. (Patient not taking: No sig reported), Disp: , Rfl:   Depression screen [facility] 2/9 12/26/2020 06/02/2020 04/07/2020  Decreased Interest 0 0 0  Down, Depressed, Hopeless 0 0 0  PHQ - 2 Score 0 0 0  Altered sleeping - - 0  Tired, decreased energy - - 0  Change in appetite - - 0  Feeling bad or failure about yourself  - - 0  Trouble concentrating - - 0  Moving slowly or fidgety/restless - - 0  Suicidal thoughts - - 0  PHQ-9 Score - - 0  Difficult doing work/chores - - Not difficult at all    GAD 7 : Generalized Anxiety Score 01/11/2020  Nervous, Anxious, on Edge 0  Control/stop worrying 0  Worry too much - different things 0  Trouble relaxing 0  Restless 0  Easily annoyed or irritable 0  Afraid - awful might happen 0  Total GAD 7 Score 0  Anxiety Difficulty Not difficult at  all    -------------------------------------------------------------------------- O: No physical exam performed due to remote telephone encounter.  Lab results reviewed.  No results found for this or any previous visit (from the past 2160 hour(s)).  -------------------------------------------------------------------------- A&P:  Problem List Items Addressed This Visit   None Visit Diagnoses     Acute non-recurrent frontal sinusitis    -  Primary   Relevant Medications   loratadine (CLARITIN) 10 MG tablet      Consistent with acute maxillary vs allergic rhinosinusitis, likely initially viral URI vs allergic rhinitis component without concern for bacterial infection.   Plan: 1. Reassurance, likely self-limited - no indication for antibiotics at this time - Agree to offer rx ONLY IF  WORSE within 24-48 hours -  Start Augmentin 875-125mg  PO BID x 10 days 2. Continue Loratadine 3. Continue Flonase 4. ADD Start Atrovent nasal spray decongestant 2 sprays in each nostril up to 4 times daily for 7 days  Supportive care with nasal saline OTC, hydration Return criteria reviewed   No orders of the defined types were placed in this encounter.   Follow-up: - Return in 1 week PRN  Patient verbalizes understanding with the above medical recommendations including the limitation of remote medical advice.  Specific follow-up and call-back criteria were given for patient to follow-up or seek medical care more urgently if needed.   - Time spent in direct consultation with patient on phone: 9 minutes   Nobie Putnam, [facility] 03/15/2021, 11:33 AM

## 2021-03-20 ENCOUNTER — Other Ambulatory Visit: Payer: Self-pay | Admitting: Family Medicine

## 2021-03-20 DIAGNOSIS — M47816 Spondylosis without myelopathy or radiculopathy, lumbar region: Secondary | ICD-10-CM

## 2021-03-20 DIAGNOSIS — G8929 Other chronic pain: Secondary | ICD-10-CM

## 2021-03-20 DIAGNOSIS — R0600 Dyspnea, unspecified: Secondary | ICD-10-CM

## 2021-03-20 DIAGNOSIS — K219 Gastro-esophageal reflux disease without esophagitis: Secondary | ICD-10-CM

## 2021-03-20 NOTE — Telephone Encounter (Signed)
  Notes to clinic:  this script has expired  Review for continue use and refill    Requested Prescriptions  Pending Prescriptions Disp Refills   albuterol (VENTOLIN HFA) 108 (90 Base) MCG/ACT inhaler [Pharmacy Med Name: ALBUTEROL HFA 90 MCG INHALER] 8.5 g 0    Sig: Inhale 2 puffs into the lungs every 4 (four) hours as needed for wheezing or shortness of breath (cough).      Pulmonology:  Beta Agonists Failed - 03/20/2021  9:48 AM      Failed - One inhaler should last at least one month. If the patient is requesting refills earlier, contact the patient to check for uncontrolled symptoms.      Passed - Valid encounter within last 12 months    Recent Outpatient Visits           5 days ago Acute non-recurrent frontal sinusitis   [facility] [place], Devonne Doughty, DO   3 months ago Annual physical exam   [facility] Olin Hauser, DO   9 months ago Type 2 diabetes mellitus with other specified complication, without long-term current use of insulin ([place])   [facility] Olin Hauser, DO   11 months ago Abscess   [facility], Lupita Raider, FNP   1 year ago Spondylosis of lumbar region without myelopathy or radiculopathy   [place], DO       Future Appointments             In 3 weeks [facility], Devonne Doughty, [facility], [place]   In 9 months  [facility], [place]              Signed Prescriptions Disp Refills   pantoprazole (PROTONIX) 40 MG tablet 90 tablet 0    Sig: Take 1 tablet (40 mg total) by mouth daily before breakfast.      Gastroenterology: Proton Pump Inhibitors Passed - 03/20/2021  9:48 AM      Passed - Valid encounter within last 12 months    Recent Outpatient Visits           5 days ago Acute non-recurrent frontal sinusitis   [place], DO   3 months ago  Annual physical exam   [facility] Olin Hauser, DO   9 months ago Type 2 diabetes mellitus with other specified complication, without long-term current use of insulin [facility])   [facility] Olin Hauser, DO   11 months ago [facility] [place], Lupita Raider, FNP   1 year ago Spondylosis of lumbar region without myelopathy or radiculopathy   [place], DO       Future Appointments             In 3 weeks Parks Ranger, Devonne Doughty, [facility], [place]   In 9 months  [facility], [facility]

## 2021-04-10 ENCOUNTER — Other Ambulatory Visit: Payer: Self-pay | Admitting: Family Medicine

## 2021-04-10 DIAGNOSIS — I10 Essential (primary) hypertension: Secondary | ICD-10-CM

## 2021-04-10 DIAGNOSIS — E1169 Type 2 diabetes mellitus with other specified complication: Secondary | ICD-10-CM

## 2021-04-11 ENCOUNTER — Other Ambulatory Visit: Payer: Self-pay

## 2021-04-11 ENCOUNTER — Other Ambulatory Visit: Payer: Medicare HMO

## 2021-04-11 DIAGNOSIS — E785 Hyperlipidemia, unspecified: Secondary | ICD-10-CM

## 2021-04-11 DIAGNOSIS — E1169 Type 2 diabetes mellitus with other specified complication: Secondary | ICD-10-CM

## 2021-04-11 DIAGNOSIS — I10 Essential (primary) hypertension: Secondary | ICD-10-CM | POA: Diagnosis not present

## 2021-04-12 LAB — COMPLETE METABOLIC PANEL WITH GFR
AG Ratio: 1.6 (calc) (ref 1.0–2.5)
ALT: 15 U/L (ref 6–29)
AST: 18 U/L (ref 10–35)
Albumin: 4.1 g/dL (ref 3.6–5.1)
Alkaline phosphatase (APISO): 82 U/L (ref 37–153)
BUN: 18 mg/dL (ref 7–25)
CO2: 27 mmol/L (ref 20–32)
Calcium: 9.5 mg/dL (ref 8.6–10.4)
Chloride: 99 mmol/L (ref 98–110)
Creat: 0.74 mg/dL (ref 0.60–0.93)
GFR, Est African American: 91 mL/min/{1.73_m2} (ref >=60)
GFR, Est Non African American: 78 mL/min/{1.73_m2} (ref >=60)
Globulin: 2.5 g/dL (calc) (ref 1.9–3.7)
Glucose, Bld: 161 mg/dL — ABNORMAL HIGH (ref 65–99)
Potassium: 4.1 mmol/L (ref 3.5–5.3)
Sodium: 137 mmol/L (ref 135–146)
Total Bilirubin: 0.6 mg/dL (ref 0.2–1.2)
Total Protein: 6.6 g/dL (ref 6.1–8.1)

## 2021-04-12 LAB — LIPID PANEL
Cholesterol: 154 mg/dL (ref <200)
HDL: 53 mg/dL (ref >=50)
LDL Cholesterol (Calc): 66 mg/dL (calc)
Non-HDL Cholesterol (Calc): 101 mg/dL (calc) (ref <130)
Total CHOL/HDL Ratio: 2.9 (calc) (ref <5.0)
Triglycerides: 269 mg/dL — ABNORMAL HIGH (ref <150)

## 2021-04-12 LAB — HEMOGLOBIN A1C
Hgb A1c MFr Bld: 6 % of total Hgb — ABNORMAL HIGH (ref <5.7)
Mean Plasma Glucose: 126 mg/dL
eAG (mmol/L): 7 mmol/L

## 2021-04-16 ENCOUNTER — Encounter: Payer: Self-pay | Admitting: Family Medicine

## 2021-04-16 ENCOUNTER — Other Ambulatory Visit: Payer: Self-pay

## 2021-04-16 ENCOUNTER — Ambulatory Visit (INDEPENDENT_AMBULATORY_CARE_PROVIDER_SITE_OTHER): Payer: Medicare HMO | Admitting: Family Medicine

## 2021-04-16 ENCOUNTER — Other Ambulatory Visit: Payer: Self-pay | Admitting: Family Medicine

## 2021-04-16 VITALS — BP 144/69 | HR 69 | Ht 66.0 in | Wt 220.8 lb

## 2021-04-16 DIAGNOSIS — E1169 Type 2 diabetes mellitus with other specified complication: Secondary | ICD-10-CM

## 2021-04-16 DIAGNOSIS — E785 Hyperlipidemia, unspecified: Secondary | ICD-10-CM | POA: Diagnosis not present

## 2021-04-16 DIAGNOSIS — I10 Essential (primary) hypertension: Secondary | ICD-10-CM

## 2021-04-16 DIAGNOSIS — G720 Drug-induced myopathy: Secondary | ICD-10-CM

## 2021-04-16 DIAGNOSIS — Z0000 Encounter for general adult medical examination without abnormal findings: Secondary | ICD-10-CM

## 2021-04-16 DIAGNOSIS — E7849 Other hyperlipidemia: Secondary | ICD-10-CM | POA: Diagnosis not present

## 2021-04-16 NOTE — Patient Instructions (Addendum)
Thank you for coming to the office today.  Keep up the great work!  Triglycerides are still mildly elevated, but I am not concerned about this. Keep on fish oil and limit extra starches/carbs.   DUE for FASTING BLOOD WORK (no food or drink after midnight before the lab appointment, only water or coffee without cream/sugar on the morning of)  SCHEDULE "Lab Only" visit in the morning at the clinic for lab draw in 8 MONTHS   - Make sure Lab Only appointment is at about 1 week before your next appointment, so that results will be available  For Lab Results, once available within 2-3 days of blood draw, you can can log in to MyChart online to view your results and a brief explanation. Also, we can discuss results at next follow-up visit.   Please schedule a Follow-up Appointment to: Return in about 8 months (around 12/15/2021) for 8 month fasting lab only then 1 week later Annual Physical.  If you have any other questions or concerns, please feel free to call the office or send a message through [facility]. You may also schedule an earlier appointment if necessary.  Additionally, you may be receiving a survey about your experience at our office within a few days to 1 week by e-mail or mail. We value your feedback.  Nobie Putnam, DO [place]

## 2021-04-16 NOTE — Assessment & Plan Note (Signed)
Controlled DM, with A1c 6.0 improved Concern with obesity, HTN, HLD  Plan:  1. Continue Metformin IR 500mg  BID - future dose adjust as needed 2. Encourage improved lifestyle - low carb, low sugar diet, reduce portion size, continue improving regular exercise

## 2021-04-16 NOTE — Progress Notes (Signed)
Subjective:    Patient ID: Yvette [name], female    DOB: 06/10/1946, 75 y.o.   MRN: [id number]  Yvette [name] is a 75 y.o. female presenting on 04/16/2021 for Hypertension and Hyperlipidemia   HPI  Type 2 Diabetes / Hyperglycemia / Morbid Obesity BMI >35 Weight Stable A1c done recently improved to 6.0 Meds: Metformin 500 BID - Of note she called insurance, Ozempic, Trulicity covered - but she declines to start today Currently on ACEi, ASA 81, statin Lifestyle: - Diet goal to improve diet still - Exercise: Limited due to arthritis and also more sedentary limited ability to go exercise now with COVID19 pandemic Admits had some polyuria prior, now seems to be reduced  Last done 2021 - Dr Wyatt Port[facility] [place], mild cataract no surgery needed, no DM retinopathy Denies hypoglycemia, visual changes, numbness or tingling.   Elevated Liver Enzymes - resolved.   HYPERLIPIDEMIA / Familial Hyperlipidemia - Reports concerns with cholesterol, prior history elevated. Cannot take Rosuvastatin or Zetia. She has famlilal cholesterol history and hyperTG - We have ordered Repatha injection. Last visit and she is doing well on Repatha, injection is smooth and does every 30 days. Last lab result 04/2021 shows dramatic improvement in lipids with Total Chol and LDL down dramatically on Repatha. Still elevated but improved TG Additional statin therapy failed due to myalgia Atorvastatin, Rosuvastatin, Simvastatin   Vitamin B12 Last lab >2000 elevated, she has been taking B12 supplement every other day Vitamin D lab shows improved >30 now   CHRONIC HTN: Home BP elevated still. Higher in doctors office. Current Meds - Amlodipine 10mg  daily, Lisinopril 40mg  daily, HCTZ 25mg  daily Reports normally good compliance. Tolerating well, w/o complaints. Admits swelling of feet and hands if inc salt intake Denies CP, dyspnea, HA, dizziness / lightheadedness    Depression screen [facility] 2/9 04/16/2021 12/26/2020  06/02/2020  Decreased Interest 0 0 0  Down, Depressed, Hopeless 0 0 0  PHQ - 2 Score 0 0 0  Altered sleeping 1 - -  Tired, decreased energy 0 - -  Change in appetite 1 - -  Feeling bad or failure about yourself  0 - -  Trouble concentrating 0 - -  Moving slowly or fidgety/restless 0 - -  Suicidal thoughts 0 - -  PHQ-9 Score 2 - -  Difficult doing work/chores Not difficult at all - -    Social History   Tobacco Use   Smoking status: Former    Packs/day: 1.00    Years: 26.00    Pack years: 26.00    Types: Cigarettes    Quit date: 06/07/1992    Years since quitting: 28.8   Smokeless tobacco: Former  Scientific laboratory technician Use: Never used  Substance Use Topics   Alcohol use: Not Currently    Alcohol/week: 0.0 standard drinks    Comment: occassional   Drug use: No    Review of Systems Per HPI unless specifically indicated above     Objective:    BP (!) 144/69   Pulse 69   Ht 5\' 6"  (1.676 m)   Wt 220 lb 12.8 oz (100.2 kg)   SpO2 96%   BMI 35.64 kg/m   Wt Readings from Last 3 Encounters:  04/16/21 220 lb 12.8 oz (100.2 kg)  03/15/21 220 lb (99.8 kg)  12/26/20 219 lb (99.3 kg)    Physical Exam Vitals and nursing note reviewed.  Constitutional:      General: She is not  in acute distress.    Appearance: She is well-developed. She is not diaphoretic.     Comments: Well-appearing, comfortable, cooperative  HENT:     Head: Normocephalic and atraumatic.  Eyes:     General:        Right eye: No discharge.        Left eye: No discharge.     Conjunctiva/sclera: Conjunctivae normal.  Neck:     Thyroid: No thyromegaly.  Cardiovascular:     Rate and Rhythm: Normal rate and regular rhythm.     Heart sounds: Normal heart sounds. No murmur heard. Pulmonary:     Effort: Pulmonary effort is normal. No respiratory distress.     Breath sounds: Normal breath sounds. No wheezing or rales.  Musculoskeletal:        General: Normal range of motion.     Cervical back: Normal  range of motion and neck supple.  Lymphadenopathy:     Cervical: No cervical adenopathy.  Skin:    General: Skin is warm and dry.     Findings: No erythema or rash.  Neurological:     Mental Status: She is alert and oriented to person, place, and time.  Psychiatric:        Behavior: Behavior normal.     Comments: Well groomed, good eye contact, normal speech and thoughts    Diabetic Foot Exam - Simple   Simple Foot Form Diabetic Foot exam was performed with the following findings: Yes 04/16/2021 10:17 AM  Visual Inspection No deformities, no ulcerations, no other skin breakdown bilaterally: Yes Sensation Testing Intact to touch and monofilament testing bilaterally: Yes Pulse Check Posterior Tibialis and Dorsalis pulse intact bilaterally: Yes Comments No ulceration or callus.       Results for orders placed or performed in visit on 04/11/21  COMPLETE METABOLIC PANEL WITH GFR  Result Value Ref Range   Glucose, Bld 161 (H) 65 - 99 mg/dL   BUN 18 7 - 25 mg/dL   Creat 0.74 0.60 - 0.93 mg/dL   GFR, Est Non African American 78 > OR = 60 mL/min/1.78m2   GFR, Est African American 91 > OR = 60 mL/min/1.44m2   BUN/Creatinine Ratio NOT APPLICABLE 6 - 22 (calc)   Sodium 137 135 - 146 mmol/L   Potassium 4.1 3.5 - 5.3 mmol/L   Chloride 99 98 - 110 mmol/L   CO2 27 20 - 32 mmol/L   Calcium 9.5 8.6 - 10.4 mg/dL   Total Protein 6.6 6.1 - 8.1 g/dL   Albumin 4.1 3.6 - 5.1 g/dL   Globulin 2.5 1.9 - 3.7 g/dL (calc)   AG Ratio 1.6 1.0 - 2.5 (calc)   Total Bilirubin 0.6 0.2 - 1.2 mg/dL   Alkaline phosphatase (APISO) 82 37 - 153 U/L   AST 18 10 - 35 U/L   ALT 15 6 - 29 U/L  Hemoglobin A1c  Result Value Ref Range   Hgb A1c MFr Bld 6.0 (H) <5.7 % of total Hgb   Mean Plasma Glucose 126 mg/dL   eAG (mmol/L) 7.0 mmol/L  Lipid panel  Result Value Ref Range   Cholesterol 154 <200 mg/dL   HDL 53 > OR = 50 mg/dL   Triglycerides 269 (H) <150 mg/dL   LDL Cholesterol (Calc) 66 mg/dL (calc)    Total CHOL/HDL Ratio 2.9 <5.0 (calc)   Non-HDL Cholesterol (Calc) 101 <130 mg/dL (calc)      Assessment & Plan:   Problem List Items Addressed This Visit  Type 2 diabetes mellitus with other specified complication ([place])    Controlled DM, with A1c 6.0 improved Concern with obesity, HTN, HLD  Plan:  1. Continue Metformin IR 500mg  BID - future dose adjust as needed 2. Encourage improved lifestyle - low carb, low sugar diet, reduce portion size, continue improving regular exercise       Morbid obesity (HCC)   Hyperlipidemia associated with type 2 diabetes mellitus ([place]) - Primary    Dramatic improved controlled on Repatha PCSK9 Failed Statin therapy Simvastatin, Atorvastatin, Rosuvastatin, intermittent dosing and failed Zetia  The 10-year ASCVD risk score [name], et al., 2013) is: 51.5%  Plan: 1. Continue Repatha 420mg  monthly Pushtronex system 2. Encourage improved lifestyle - low carb/cholesterol, reduce portion size, continue improving regular exercise       Familial hyperlipidemia   Essential hypertension    Controlled  Continue other meds Amlodipine 10 and HCTZ 25mg  daily, Irbesartan 300mg  daily Monitor BP Follow-up as planned       Drug-induced myopathy   Familial HLD - see A&P   Cannot tolerate statin therapy due to myalgia/myopathy On PCSK9    No orders of the defined types were placed in this encounter.     Follow up plan: Return in about 8 months (around 12/15/2021) for 8 month fasting lab only then 1 week later Annual Physical.  Future labs ordered for 12/10/21   Nobie Putnam, [facility] 04/16/2021, 10:03 AM

## 2021-04-16 NOTE — Assessment & Plan Note (Signed)
Dramatic improved controlled on Repatha PCSK9 Failed Statin therapy Simvastatin, Atorvastatin, Rosuvastatin, intermittent dosing and failed Zetia  The 10-year ASCVD risk score Yvette Bussing DC Jr., et al., 2013) is: 51.5%  Plan: 1. Continue Repatha 420mg  monthly Pushtronex system 2. Encourage improved lifestyle - low carb/cholesterol, reduce portion size, continue improving regular exercise

## 2021-04-16 NOTE — Assessment & Plan Note (Signed)
Controlled  Continue other meds Amlodipine 10 and HCTZ 25mg  daily, Irbesartan 300mg  daily Monitor BP Follow-up as planned

## 2021-04-17 ENCOUNTER — Ambulatory Visit (INDEPENDENT_AMBULATORY_CARE_PROVIDER_SITE_OTHER): Payer: Medicare HMO | Admitting: Licensed Clinical Social Worker

## 2021-04-17 DIAGNOSIS — E1169 Type 2 diabetes mellitus with other specified complication: Secondary | ICD-10-CM | POA: Diagnosis not present

## 2021-04-17 DIAGNOSIS — I10 Essential (primary) hypertension: Secondary | ICD-10-CM | POA: Diagnosis not present

## 2021-04-17 DIAGNOSIS — F419 Anxiety disorder, unspecified: Secondary | ICD-10-CM

## 2021-04-17 NOTE — Patient Instructions (Signed)
Visit Information   Goals Addressed               This Visit's Progress     Patient Stated     SW- I need additional support and resource connection. I have little support since my spouse passed 4 years ago" (pt-stated)   On track     Timeframe:  Long-Range Goal Priority:  Medium Start Date:   01/16/21                      Expected End Date:  09/05/21                  Follow Up Date- 08/14/21  Patient Self Care Activities:  Attend all scheduled provider appointments Call provider office for new concerns or questions Continue utilizing healthy coping skills to assist in management of symptoms          Patient verbalizes understanding of instructions provided today and agrees to view in [place].   Telephone follow up appointment with care management team member scheduled for: 08/14/21  Christa See, MSW, [email or web address] Phone [phone] 2:23 PM

## 2021-04-17 NOTE — Chronic Care Management (AMB) (Signed)
Chronic Care Management    Clinical Social Work Note  04/17/2021 Name: Yvette [name] MRN: [id number] DOB: 1946/08/16  Yvette [name] is a 75 y.o. year old female who is a primary care patient of Olin Hauser, DO. The CCM team was consulted to assist the patient with chronic disease management and/or care coordination needs related to: Mental Health Counseling and Resources.   Engaged with patient by telephone for follow up visit in response to provider referral for social work chronic care management and care coordination services.   Consent to Services:  The patient was given information about Chronic Care Management services, agreed to services, and gave verbal consent prior to initiation of services.  Please see initial visit note for detailed documentation.   Patient agreed to services and consent obtained.   Assessment: Patient is engaged in conversation, continues to maintain positive progress with care plan goals. Patient reports decrease in symptoms and an improvement in the management of medical conditions. She continues to receive strong support from family and was successful in identifying strategies to cope with grief/stress. See Care Plan below for interventions and patient self-care actives. Recommendation: Patient may benefit from, and is in agreement to work with LCSW to address care coordination needs and will continue to work with the clinical team to address health care and disease management related needs.  Follow up Plan: Patient would like continued follow-up from CCM LCSW .  Follow up scheduled in 08/14/21. Patient will call office if needed prior to next encounter.   SDOH (Social Determinants of Health) assessments and interventions performed:    Advanced Directives Status: Not addressed in this encounter.  CCM Care Plan  Allergies  Allergen Reactions   Simvastatin Other (See Comments)    Myalgia, muscle aches lower extremity   Erythromycin Nausea And  Vomiting    Outpatient Encounter Medications as of 04/17/2021  Medication Sig Note   Acetaminophen (TYLENOL 8 HOUR ARTHRITIS PAIN [name]) Take by mouth. 2 in am 1 in pm    albuterol (VENTOLIN HFA) 108 (90 Base) MCG/ACT inhaler Inhale 2 puffs into the lungs every 4 (four) hours as needed for wheezing or shortness of breath (cough).    amLODipine (NORVASC) 10 MG tablet Take 1 tablet (10 mg total) by mouth daily.    aspirin 81 MG tablet Take 81 mg by mouth daily.    beta carotene w/minerals (OCUVITE) tablet Take 1 tablet by mouth daily.    Calcium-Phosphorus-Vitamin D (CITRACAL +D3 [name]) Take 1 Dose by mouth 2 (two) times daily.    Co-Enzyme Q-10 100 MG CAPS Take 1 capsule by mouth daily.    fluticasone (FLONASE) 50 MCG/ACT nasal spray Place 2 sprays into both nostrils daily. 11/30/2019: Nightly    furosemide (LASIX) 20 MG tablet Take 1-2 tablets (20-40 mg total) by mouth daily as needed for fluid or edema. Recommend use up to 3-5 days at a time if needed. (Patient not taking: No sig reported)    gabapentin (NEURONTIN) 100 MG capsule Take 2 capsules (200 mg total) by mouth at bedtime.    hydrochlorothiazide (HYDRODIURIL) 25 MG tablet Take 1 tablet (25 mg total) by mouth daily.    ipratropium (ATROVENT) 0.06 % nasal spray Place 2 sprays into both nostrils 4 (four) times daily. For up to 5-7 days then stop.    irbesartan (AVAPRO) 300 MG tablet Take 1 tablet (300 mg total) by mouth daily. May start with half tab daily for 1-2 weeks then increase to 1 whole  tab daily    Krill Oil 300 MG CAPS Take by mouth daily.    loratadine (CLARITIN) 10 MG tablet Take 10 mg by mouth daily.    Mag Aspart-Potassium Aspart (POTASSIUM & MAGNESIUM ASPARTAT [name]) Take 1 tablet by mouth daily. (Patient not taking: No sig reported)    metFORMIN (GLUCOPHAGE) 500 MG tablet Take 1 tablet (500 mg total) by mouth 2 (two) times daily with a meal.    Multiple Vitamin (MULTIVITAMIN) tablet Take 1 tablet by mouth daily.    naproxen  (NAPROSYN) 500 MG tablet Take 1 tablet (500 mg total) by mouth 2 (two) times daily with a meal. For 1-2 weeks then as needed    pantoprazole (PROTONIX) 40 MG tablet Take 1 tablet (40 mg total) by mouth daily before breakfast.    REPATHA PUSHTRONEX SYSTEM 420 MG/3.5ML SOCT Inject 420 mg into the skin every 30 (thirty) days.    No facility-administered encounter medications on file as of 04/17/2021.    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   Abscess 04/07/2020   Venous insufficiency of both lower extremities 12/01/2019   Suspected sleep apnea 07/13/2018   Former smoker 07/13/2018   Multiple renal cysts 11/12/2017   Vitamin D deficiency 10/15/2017   Spondylosis of lumbar region without myelopathy or radiculopathy 06/10/2017   Chronic right-sided low back pain with sciatica 06/10/2017   Primary osteoarthritis involving multiple joints 06/10/2017   BPPV (benign paroxysmal positional vertigo), right 06/10/2017   Nausea without vomiting 06/10/2017   Environmental and seasonal allergies 02/12/2017   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/2017   Morbid obesity ([place]) 02/12/2017   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   Dupuytren's contracture of left hand 10/14/2016   Anxiety disorder 09/03/2016   GERD (gastroesophageal reflux disease) 09/03/2016   Prophylactic use of raloxifene ([place]) 09/03/2016   Hyperlipidemia associated with type 2 diabetes mellitus ([place]) 09/03/2016   Type 2 diabetes mellitus with other specified complication ([place]) 62/13/0865   Dyspnea 07/23/2013   Essential hypertension     Conditions to be addressed/monitored: Anxiety; Mental Health Concerns   Care Plan : General Social Work (Adult)  Updates made by Rebekah Chesterfield, LCSW since 04/17/2021 12:00 AM     Problem: Coping Skills (General Plan of Care)      Long-Range Goal: Coping Skills Enhanced   Start Date: 01/16/2021  This Visit's Progress: On  track  Recent Progress: On track  Priority: Medium  Note:   Timeframe:  Long-Range Goal Priority:  Medium Start Date:   01/16/21                      Expected End Date:  09/05/21                  Follow Up Date- 08/14/21  Current Barriers:  Acute Mental Health needs related to Anxiety and ongoing grief symptoms  Financial constraints related to managing health care expenses and affording bills Limited social support Limited access to food Level of care concerns ADL IADL limitations Limited access to caregiver Inability to perform ADL's independently Inability to perform IADL's independently Lacks knowledge of community resource: available food support resources  Clinical Social Work Goal(s):  Over the next 120 days, patient will work with SW bi-monthly by telephone or in person to reduce or manage symptoms related to anxiety Over the next 120 days, client will work with SW to address concerns related to gaining additional support/resources  in order to improve self-care, physical health, emotional health and overall well-being.   Interventions: Patient interviewed and appropriate assessments performed: brief mental health assessment Patient reports that she is "doing fine" Patient completed an appointment with PCP yesterday. She was excited about improvement in the management of health conditions, stating the medication "did wonders" Patient has also implemented exercise and healthy in eating habits Patient reports that she has been cleaning home in an attempt to downsize Patient enjoys spending time with family. She has plans to visit the beach with family next month, in addition, to adopting a pet CCM LCSW provided validation and encouragement Active listening / Reflection utilized , Emotional Supportive Provided, and Verbalization of feelings encouraged  Discussed plans with patient for ongoing care management follow up and provided patient with direct contact information for care  management team CCM LCSW encouraged patient to continue healthy self-care implementation into her routine by eating healthy, drinking more fluids and stayting active Patient was provided positive reinforcement for recent self-care and healthy depression management implementation Collaboration with PCP regarding development and update of comprehensive plan of care as evidenced by provider attestation and co-signature Inter-disciplinary care team collaboration (see longitudinal plan of care)  Patient Self Care Activities:  Attend all scheduled provider appointments Call provider office for new concerns or questions Continue utilizing healthy coping skills to assist in management of symptoms           Christa See, MSW, [email or web address] Phone [phone] 2:20 PM

## 2021-04-24 ENCOUNTER — Other Ambulatory Visit: Payer: Self-pay | Admitting: Family Medicine

## 2021-04-24 DIAGNOSIS — M5441 Lumbago with sciatica, right side: Secondary | ICD-10-CM

## 2021-04-24 DIAGNOSIS — G8929 Other chronic pain: Secondary | ICD-10-CM

## 2021-04-24 DIAGNOSIS — M47816 Spondylosis without myelopathy or radiculopathy, lumbar region: Secondary | ICD-10-CM

## 2021-04-24 NOTE — Telephone Encounter (Signed)
Requested Prescriptions  Pending Prescriptions Disp Refills  . naproxen (NAPROSYN) 500 MG tablet [Pharmacy Med Name: NAPROXEN 500 MG TABLET] 60 tablet 0    Sig: Take 1 tablet (500 mg total) by mouth 2 (two) times daily with a meal. For 1-2 weeks then as needed     Analgesics:  NSAIDS Passed - 04/24/2021  4:35 PM      Passed - Cr in normal range and within 360 days    Creat  Date Value Ref Range Status  04/11/2021 0.74 0.60 - 0.93 mg/dL Final    Comment:    For patients >37 years of age, the reference limit for Creatinine is approximately 13% higher for people identified as African-American. .          Passed - HGB in normal range and within 360 days    Hemoglobin  Date Value Ref Range Status  11/30/2020 12.4 11.7 - 15.5 g/dL Final   HGB  Date Value Ref Range Status  11/20/2011 13.6 12.0 - 16.0 g/dL Final         Passed - Patient is not pregnant      Passed - Valid encounter within last 12 months    Recent Outpatient Visits          1 week ago Hyperlipidemia associated with type 2 diabetes mellitus ([place])   [facility], Devonne Doughty, DO   1 month ago Acute non-recurrent frontal sinusitis   [place], DO   4 months ago Annual physical exam   [facility] Olin Hauser, DO   10 months ago Type 2 diabetes mellitus with other specified complication, without long-term current use of insulin [facility])   [facility] Olin Hauser, DO   1 year ago [facility], Lupita Raider, FNP      Future Appointments            In 7 months [facility], Devonne Doughty, [facility], [place]   In 8 months  [facility], [facility]

## 2021-06-01 ENCOUNTER — Other Ambulatory Visit: Payer: Self-pay | Admitting: Family Medicine

## 2021-06-01 DIAGNOSIS — M5441 Lumbago with sciatica, right side: Secondary | ICD-10-CM

## 2021-06-01 DIAGNOSIS — M47816 Spondylosis without myelopathy or radiculopathy, lumbar region: Secondary | ICD-10-CM

## 2021-06-01 NOTE — Telephone Encounter (Signed)
Valid encounter , future visit in 6 months

## 2021-07-11 ENCOUNTER — Other Ambulatory Visit: Payer: Self-pay | Admitting: Family Medicine

## 2021-07-11 DIAGNOSIS — G8929 Other chronic pain: Secondary | ICD-10-CM

## 2021-07-11 DIAGNOSIS — M47816 Spondylosis without myelopathy or radiculopathy, lumbar region: Secondary | ICD-10-CM

## 2021-07-11 DIAGNOSIS — K219 Gastro-esophageal reflux disease without esophagitis: Secondary | ICD-10-CM

## 2021-08-14 ENCOUNTER — Ambulatory Visit (INDEPENDENT_AMBULATORY_CARE_PROVIDER_SITE_OTHER): Payer: Medicare HMO | Admitting: Licensed Clinical Social Worker

## 2021-08-14 DIAGNOSIS — E1169 Type 2 diabetes mellitus with other specified complication: Secondary | ICD-10-CM

## 2021-08-14 DIAGNOSIS — F411 Generalized anxiety disorder: Secondary | ICD-10-CM

## 2021-08-14 DIAGNOSIS — E785 Hyperlipidemia, unspecified: Secondary | ICD-10-CM

## 2021-08-14 DIAGNOSIS — I10 Essential (primary) hypertension: Secondary | ICD-10-CM

## 2021-08-16 NOTE — Patient Instructions (Signed)
Visit Information  Patient Self Care Activities:  Attend all scheduled provider appointments Call provider office for new concerns or questions Continue utilizing healthy coping skills to assist in management of symptoms  Patient verbalizes understanding of instructions provided today and agrees to view in [place].   Telephone follow up appointment with care management team member scheduled for:11/13/21  Christa See, MSW, [email or web address] Phone [phone] 10:00 AM

## 2021-08-16 NOTE — Chronic Care Management (AMB) (Signed)
Chronic Care Management    Clinical Social Work Note  08/16/2021 Name: [name] MRN: [id number] DOB: Oct 04, 1946  Yvette [name] is a 75 y.o. year old female who is a primary care patient of Olin Hauser, DO. The CCM team was consulted to assist the patient with chronic disease management and/or care coordination needs related to: Mental Health Counseling and Resources.   Engaged with patient by telephone for follow up visit in response to provider referral for social work chronic care management and care coordination services.   Consent to Services:  The patient was given information about Chronic Care Management services, agreed to services, and gave verbal consent prior to initiation of services.  Please see initial visit note for detailed documentation.   Patient agreed to services and consent obtained.   Consent to Services:  The patient was given information about Care Management services, agreed to services, and gave verbal consent prior to initiation of services.  Please see initial visit note for detailed documentation.   Patient agreed to services today and consent obtained.  Engaged with patient by phone in response to provider referral for social work care coordination services:  Assessment/Interventions:  Patient continues to maintain positive progress with care plan goals. She reports decrease in anxiety symptoms with continued medication management. Patient has observed an improvement in mood with the adoption of pet and engages in social activities outside of the home. See Care Plan below for interventions and patient self-care activities.  Recommendation: Patient may benefit from, and is in agreement work with LCSW to address care coordination needs and will continue to work with the clinical team to address health care and disease management related needs.   Follow up Plan: Patient would like continued follow-up from CCM LCSW .  per patient's request will  follow up in 11/13/21.  Will call office if needed prior to next encounter.  SDOH (Social Determinants of Health) assessments and interventions performed:    Advanced Directives Status: Not addressed in this encounter.  CCM Care Plan  Allergies  Allergen Reactions   Simvastatin Other (See Comments)    Myalgia, muscle aches lower extremity   Erythromycin Nausea And Vomiting    Outpatient Encounter Medications as of 08/14/2021  Medication Sig Note   Acetaminophen (TYLENOL 8 HOUR ARTHRITIS PAIN PO) Take by mouth. 2 in am 1 in pm    albuterol (VENTOLIN HFA) 108 (90 Base) MCG/ACT inhaler Inhale 2 puffs into the lungs every 4 (four) hours as needed for wheezing or shortness of breath (cough).    amLODipine (NORVASC) 10 MG tablet Take 1 tablet (10 mg total) by mouth daily.    aspirin 81 MG tablet Take 81 mg by mouth daily.    beta carotene w/minerals (OCUVITE) tablet Take 1 tablet by mouth daily.    Calcium-Phosphorus-Vitamin D (CITRACAL +D3 PO) Take 1 Dose by mouth 2 (two) times daily.    Co-Enzyme Q-10 100 MG CAPS Take 1 capsule by mouth daily.    fluticasone (FLONASE) 50 MCG/ACT nasal spray Place 2 sprays into both nostrils daily. 11/30/2019: Nightly    furosemide (LASIX) 20 MG tablet Take 1-2 tablets (20-40 mg total) by mouth daily as needed for fluid or edema. Recommend use up to 3-5 days at a time if needed. (Patient not taking: No sig reported)    gabapentin (NEURONTIN) 100 MG capsule Take 2 capsules (200 mg total) by mouth at bedtime.    hydrochlorothiazide (HYDRODIURIL) 25 MG tablet Take 1 tablet (25 mg  total) by mouth daily.    ipratropium (ATROVENT) 0.06 % nasal spray Place 2 sprays into both nostrils 4 (four) times daily. For up to 5-7 days then stop.    irbesartan (AVAPRO) 300 MG tablet Take 1 tablet (300 mg total) by mouth daily. May start with half tab daily for 1-2 weeks then increase to 1 whole tab daily    Krill Oil 300 MG CAPS Take by mouth daily.    loratadine (CLARITIN) 10  MG tablet Take 10 mg by mouth daily.    Mag Aspart-Potassium Aspart (POTASSIUM & MAGNESIUM ASPARTAT PO) Take 1 tablet by mouth daily. (Patient not taking: No sig reported)    metFORMIN (GLUCOPHAGE) 500 MG tablet Take 1 tablet (500 mg total) by mouth 2 (two) times daily with a meal.    Multiple Vitamin (MULTIVITAMIN) tablet Take 1 tablet by mouth daily.    naproxen (NAPROSYN) 500 MG tablet Take 1 tablet (500 mg total) by mouth 2 (two) times daily with a meal. For 1-2 weeks then as needed    pantoprazole (PROTONIX) 40 MG tablet Take 1 tablet (40 mg total) by mouth daily before breakfast.    REPATHA PUSHTRONEX SYSTEM 420 MG/3.5ML SOCT Inject 420 mg into the skin every 30 (thirty) days.    No facility-administered encounter medications on file as of 08/14/2021.    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   Abscess 04/07/2020   Venous insufficiency of both lower extremities 12/01/2019   Suspected sleep apnea 07/13/2018   Former smoker 07/13/2018   Multiple renal cysts 11/12/2017   Vitamin D deficiency 10/15/2017   Spondylosis of lumbar region without myelopathy or radiculopathy 06/10/2017   Chronic right-sided low back pain with sciatica 06/10/2017   Primary osteoarthritis involving multiple joints 06/10/2017   BPPV (benign paroxysmal positional vertigo), right 06/10/2017   Nausea without vomiting 06/10/2017   Environmental and seasonal allergies 02/12/2017   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/2017   Morbid obesity ([place]) 02/12/2017   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   Dupuytren's contracture of left hand 10/14/2016   Anxiety disorder 09/03/2016   GERD (gastroesophageal reflux disease) 09/03/2016   Prophylactic use of raloxifene ([place]) 09/03/2016   Hyperlipidemia associated with type 2 diabetes mellitus ([place]) 09/03/2016   Type 2 diabetes mellitus with other specified complication ([place]) 84/16/6063   Dyspnea  07/23/2013   Essential hypertension     Conditions to be addressed/monitored: Anxiety  Care Plan : General Social Work (Adult)  Updates made by Rebekah Chesterfield, LCSW since 08/16/2021 12:00 AM     Problem: Coping Skills (General Plan of Care)      Long-Range Goal: Coping Skills Enhanced   Start Date: 01/16/2021  This Visit's Progress: On track  Recent Progress: On track  Priority: Medium  Note:   Timeframe:  Long-Range Goal Priority:  Medium Start Date:   01/16/21                      Expected End Date:  09/05/21                 Follow Up Date- 11/13/21  Current Barriers:  Acute Mental Health needs related to Anxiety and ongoing grief symptoms  Financial constraints related to managing health care expenses and affording bills Limited social support Limited access to food Level of care concerns ADL IADL limitations Limited access to caregiver Inability to perform ADL's independently Inability to perform IADL's independently Lacks  knowledge of community resource: available food support resources Clinical Social Work Goal(s):  Over the next 120 days, patient will work with SW bi-monthly by telephone or in person to reduce or manage symptoms related to anxiety Over the next 120 days, client will work with SW to address concerns related to gaining additional support/resources in order to improve self-care, physical health, emotional health and overall well-being.  Interventions: Patient interviewed and appropriate assessments performed: brief mental health assessment Patient reports that she is "doing fine" 11/8: Patient reports management of anxiety symptoms. She has adopted a pet that brings her much love and joy Patient completed an appointment with PCP yesterday. She was excited about improvement in the management of health conditions, stating the medication "did wonders" Patient has also implemented exercise and healthy in eating habits 11/8: Patient participates in chair yoga  with ladies from her church and engages in yard work at home. Patient continues to pace herself with the amount of exercise due to hx of leg weakness from medications.  Patient enjoys spending time with family. She has plans to have family visit for the holidays CCM LCSW provided validation and encouragement Active listening / Reflection utilized , Emotional Supportive Provided, and Verbalization of feelings encouraged  Discussed plans with patient for ongoing care management follow up and provided patient with direct contact information for care management team CCM LCSW encouraged patient to continue healthy self-care implementation into her routine by eating healthy, drinking more fluids and stayting active Patient was provided positive reinforcement for recent self-care and healthy depression management implementation Collaboration with PCP regarding development and update of comprehensive plan of care as evidenced by provider attestation and co-signature Inter-disciplinary care team collaboration (see longitudinal plan of care) Patient Self Care Activities:  Attend all scheduled provider appointments Call provider office for new concerns or questions Continue utilizing healthy coping skills to assist in management of symptoms      Christa See, MSW, [email or web address] Phone [phone] 9:57 AM

## 2021-09-05 DIAGNOSIS — E785 Hyperlipidemia, unspecified: Secondary | ICD-10-CM

## 2021-09-05 DIAGNOSIS — I10 Essential (primary) hypertension: Secondary | ICD-10-CM

## 2021-09-05 DIAGNOSIS — E1169 Type 2 diabetes mellitus with other specified complication: Secondary | ICD-10-CM

## 2021-11-13 ENCOUNTER — Ambulatory Visit (INDEPENDENT_AMBULATORY_CARE_PROVIDER_SITE_OTHER): Payer: Medicare HMO | Admitting: Licensed Clinical Social Worker

## 2021-11-13 DIAGNOSIS — I10 Essential (primary) hypertension: Secondary | ICD-10-CM

## 2021-11-13 DIAGNOSIS — E1169 Type 2 diabetes mellitus with other specified complication: Secondary | ICD-10-CM

## 2021-11-13 DIAGNOSIS — F411 Generalized anxiety disorder: Secondary | ICD-10-CM

## 2021-11-22 NOTE — Patient Instructions (Signed)
Visit Information  Thank you for taking time to visit with me today. Please don't hesitate to contact me if I can be of assistance to you before our next scheduled telephone appointment.  Following are the goals we discussed today:  Patient Self Care Activities:  Attend all scheduled provider appointments Call provider office for new concerns or questions Continue utilizing healthy coping skills to assist in management of symptoms  Our next appointment is by telephone on 02/26/22 at 3:00 PM  Please call the care guide team at [phone] if you need to cancel or reschedule your appointment.   If you are experiencing a Mental Health or [place] or need someone to talk to, please call the [place] National Suicide Prevention Lifeline: 731-822-9476 or TTY: (408)128-7234 TTY 2197312414) to talk to a trained counselor call 911   Patient verbalizes understanding of instructions and care plan provided today and agrees to view in [place]. Active MyChart status confirmed with patient.    Christa See, MSW, [facility] Care Management [email or web address] Phone [phone] 5:33 PM

## 2021-11-22 NOTE — Chronic Care Management (AMB) (Signed)
Chronic Care Management    Clinical Social Work Note  11/22/2021 Name: Yvette [name] MRN: [id number] DOB: Feb 26, 1946  Yvette [name] is a 76 y.o. year old female who is a primary care patient of Olin Hauser, DO. The CCM team was consulted to assist the patient with chronic disease management and/or care coordination needs related to: Mental Health Counseling and Resources.   Engaged with patient by telephone for follow up visit in response to provider referral for social work chronic care management and care coordination services.   Consent to Services:  The patient was given information about Chronic Care Management services, agreed to services, and gave verbal consent prior to initiation of services.  Please see initial visit note for detailed documentation.   Patient agreed to services and consent obtained.   Summary:  Patient continues to maintain positive progress with care plan goals. Pt is proud of recent weight loss and utilizes walking stick to promote balance, while walking dog. Patient plans on implementing meal prep and prioritizing rest. See Care Plan below for interventions and patient self-care activities.  Recommendation: Patient may benefit from, and is in agreement work with LCSW to address care coordination needs and will continue to work with the clinical team to address health care and disease management related needs.   Follow up Plan: Patient would like continued follow-up from CCM LCSW.  per patient's request will follow up in 02/26/22.  Will call office if needed prior to next encounter.    SDOH (Social Determinants of Health) assessments and interventions performed:    Advanced Directives Status: Not addressed in this encounter.  CCM Care Plan  Allergies  Allergen Reactions   Simvastatin Other (See Comments)    Myalgia, muscle aches lower extremity   Erythromycin Nausea And Vomiting    Outpatient Encounter Medications as of 11/13/2021   Medication Sig Note   Acetaminophen (TYLENOL 8 HOUR ARTHRITIS PAIN PO) Take by mouth. 2 in am 1 in pm    albuterol (VENTOLIN HFA) 108 (90 Base) MCG/ACT inhaler Inhale 2 puffs into the lungs every 4 (four) hours as needed for wheezing or shortness of breath (cough).    amLODipine (NORVASC) 10 MG tablet Take 1 tablet (10 mg total) by mouth daily.    aspirin 81 MG tablet Take 81 mg by mouth daily.    beta carotene w/minerals (OCUVITE) tablet Take 1 tablet by mouth daily.    Calcium-Phosphorus-Vitamin D (CITRACAL +D3 PO) Take 1 Dose by mouth 2 (two) times daily.    Co-Enzyme Q-10 100 MG CAPS Take 1 capsule by mouth daily.    fluticasone (FLONASE) 50 MCG/ACT nasal spray Place 2 sprays into both nostrils daily. 11/30/2019: Nightly    furosemide (LASIX) 20 MG tablet Take 1-2 tablets (20-40 mg total) by mouth daily as needed for fluid or edema. Recommend use up to 3-5 days at a time if needed. (Patient not taking: No sig reported)    gabapentin (NEURONTIN) 100 MG capsule Take 2 capsules (200 mg total) by mouth at bedtime.    hydrochlorothiazide (HYDRODIURIL) 25 MG tablet Take 1 tablet (25 mg total) by mouth daily.    ipratropium (ATROVENT) 0.06 % nasal spray Place 2 sprays into both nostrils 4 (four) times daily. For up to 5-7 days then stop.    irbesartan (AVAPRO) 300 MG tablet Take 1 tablet (300 mg total) by mouth daily. May start with half tab daily for 1-2 weeks then increase to 1 whole tab daily  Krill Oil 300 MG CAPS Take by mouth daily.    loratadine (CLARITIN) 10 MG tablet Take 10 mg by mouth daily.    Mag Aspart-Potassium Aspart (POTASSIUM & MAGNESIUM ASPARTAT PO) Take 1 tablet by mouth daily. (Patient not taking: No sig reported)    metFORMIN (GLUCOPHAGE) 500 MG tablet Take 1 tablet (500 mg total) by mouth 2 (two) times daily with a meal.    Multiple Vitamin (MULTIVITAMIN) tablet Take 1 tablet by mouth daily.    naproxen (NAPROSYN) 500 MG tablet Take 1 tablet (500 mg total) by mouth 2 (two)  times daily with a meal. For 1-2 weeks then as needed    pantoprazole (PROTONIX) 40 MG tablet Take 1 tablet (40 mg total) by mouth daily before breakfast.    REPATHA PUSHTRONEX SYSTEM 420 MG/3.5ML SOCT Inject 420 mg into the skin every 30 (thirty) days.    No facility-administered encounter medications on file as of 11/13/2021.    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   Abscess 04/07/2020   Venous insufficiency of both lower extremities 12/01/2019   Suspected sleep apnea 07/13/2018   Former smoker 07/13/2018   Multiple renal cysts 11/12/2017   Vitamin D deficiency 10/15/2017   Spondylosis of lumbar region without myelopathy or radiculopathy 06/10/2017   Chronic right-sided low back pain with sciatica 06/10/2017   Primary osteoarthritis involving multiple joints 06/10/2017   BPPV (benign paroxysmal positional vertigo), right 06/10/2017   Nausea without vomiting 06/10/2017   Environmental and seasonal allergies 02/12/2017   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/2017   Morbid obesity ([place]) 02/12/2017   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   Dupuytren's contracture of left hand 10/14/2016   Anxiety disorder 09/03/2016   GERD (gastroesophageal reflux disease) 09/03/2016   Prophylactic use of raloxifene ([place]) 09/03/2016   Hyperlipidemia associated with type 2 diabetes mellitus ([place]) 09/03/2016   Type 2 diabetes mellitus with other specified complication ([place]) 19/37/9024   Dyspnea 07/23/2013   Essential hypertension     Conditions to be addressed/monitored: HTN, DMII, and Anxiety  Care Plan : General Social Work (Adult)  Updates made by Rebekah Chesterfield, LCSW since 11/22/2021 12:00 AM     Problem: Coping Skills (General Plan of Care)      Long-Range Goal: Coping Skills Enhanced   Start Date: 01/16/2021  This Visit's Progress: On track  Recent Progress: On track  Priority: Medium  Note:   Timeframe:   Long-Range Goal Priority:  Medium Start Date:   01/16/21                      Expected End Date:  04/05/22                Follow Up Date- 02/26/22  Current Barriers:  Acute Mental Health needs related to Anxiety and ongoing grief symptoms  Financial constraints related to managing health care expenses and affording bills Limited social support Limited access to food Level of care concerns ADL IADL limitations Limited access to caregiver Inability to perform ADL's independently Inability to perform IADL's independently Lacks knowledge of community resource: available food support resources Clinical Social Work Goal(s):  Over the next 120 days, patient will work with SW bi-monthly by telephone or in person to reduce or manage symptoms related to anxiety Over the next 120 days, client will work with SW to address concerns related to gaining additional support/resources in order to improve self-care, physical health, emotional health  and overall well-being.  Interventions: Patient interviewed and appropriate assessments performed: brief mental health assessment Patient reports that she is "doing fine" 11/8: Patient reports management of anxiety symptoms. She has adopted a pet that brings her much love and joy 2/7: Patient reports management of anxiety symptoms Patient completed an appointment with PCP yesterday. She was excited about improvement in the management of health conditions, stating the medication "did wonders" Patient has also implemented exercise and healthy in eating habits 11/8: Patient participates in chair yoga with ladies from her church and engages in yard work at home. Patient continues to pace herself with the amount of exercise due to hx of leg weakness from medications 2/7: Patient endorsed difficulty walking due to increased weakness in both legs. She discontinued the use of cholesterol medicine in December 2022 and January 2023; however, symptoms were similar. CCM LCSW  strongly encouraged patient to address with PCP Patient enjoys spending time with family. She has plans to have family visit for the holidays CCM LCSW provided validation and encouragement Active listening / Reflection utilized , Emotional Supportive Provided, and Verbalization of feelings encouraged  Discussed plans with patient for ongoing care management follow up and provided patient with direct contact information for care management team CCM LCSW encouraged patient to continue healthy self-care implementation into her routine by eating healthy, drinking more fluids and stayting active 2/7: Pt is proud of recent weight loss and utilizes walking stick to promote balance, while walking dog. Patient plans on implementing meal prep and prioritizing rest Patient was provided positive reinforcement for recent self-care and healthy depression management implementation Collaboration with PCP regarding development and update of comprehensive plan of care as evidenced by provider attestation and co-signature Inter-disciplinary care team collaboration (see longitudinal plan of care) Patient Self Care Activities:  Attend all scheduled provider appointments Call provider office for new concerns or questions Continue utilizing healthy coping skills to assist in management of symptoms      Christa See, MSW, [email or web address] Phone [phone] 5:32 PM

## 2021-12-04 DIAGNOSIS — I10 Essential (primary) hypertension: Secondary | ICD-10-CM

## 2021-12-04 DIAGNOSIS — E1169 Type 2 diabetes mellitus with other specified complication: Secondary | ICD-10-CM

## 2021-12-07 ENCOUNTER — Other Ambulatory Visit: Payer: Self-pay

## 2021-12-07 DIAGNOSIS — E7849 Other hyperlipidemia: Secondary | ICD-10-CM

## 2021-12-07 DIAGNOSIS — E785 Hyperlipidemia, unspecified: Secondary | ICD-10-CM

## 2021-12-07 DIAGNOSIS — I10 Essential (primary) hypertension: Secondary | ICD-10-CM

## 2021-12-07 DIAGNOSIS — E1169 Type 2 diabetes mellitus with other specified complication: Secondary | ICD-10-CM

## 2021-12-07 DIAGNOSIS — Z0000 Encounter for general adult medical examination without abnormal findings: Secondary | ICD-10-CM

## 2021-12-10 ENCOUNTER — Other Ambulatory Visit: Payer: Medicare HMO

## 2021-12-10 ENCOUNTER — Other Ambulatory Visit: Payer: Self-pay

## 2021-12-10 DIAGNOSIS — I10 Essential (primary) hypertension: Secondary | ICD-10-CM | POA: Diagnosis not present

## 2021-12-10 DIAGNOSIS — E1169 Type 2 diabetes mellitus with other specified complication: Secondary | ICD-10-CM | POA: Diagnosis not present

## 2021-12-10 DIAGNOSIS — Z0000 Encounter for general adult medical examination without abnormal findings: Secondary | ICD-10-CM | POA: Diagnosis not present

## 2021-12-10 DIAGNOSIS — E7849 Other hyperlipidemia: Secondary | ICD-10-CM | POA: Diagnosis not present

## 2021-12-10 DIAGNOSIS — E785 Hyperlipidemia, unspecified: Secondary | ICD-10-CM | POA: Diagnosis not present

## 2021-12-11 LAB — COMPLETE METABOLIC PANEL WITH GFR
AG Ratio: 1.9 (calc) (ref 1.0–2.5)
ALT: 10 U/L (ref 6–29)
AST: 15 U/L (ref 10–35)
Albumin: 4.3 g/dL (ref 3.6–5.1)
Alkaline phosphatase (APISO): 81 U/L (ref 37–153)
BUN: 17 mg/dL (ref 7–25)
CO2: 27 mmol/L (ref 20–32)
Calcium: 9.8 mg/dL (ref 8.6–10.4)
Chloride: 103 mmol/L (ref 98–110)
Creat: 0.6 mg/dL (ref 0.60–1.00)
Globulin: 2.3 g/dL (calc) (ref 1.9–3.7)
Glucose, Bld: 146 mg/dL — ABNORMAL HIGH (ref 65–99)
Potassium: 4.5 mmol/L (ref 3.5–5.3)
Sodium: 141 mmol/L (ref 135–146)
Total Bilirubin: 0.6 mg/dL (ref 0.2–1.2)
Total Protein: 6.6 g/dL (ref 6.1–8.1)
eGFR: 92 mL/min/{1.73_m2} (ref >=60)

## 2021-12-11 LAB — HEMOGLOBIN A1C
Hgb A1c MFr Bld: 6.1 % of total Hgb — ABNORMAL HIGH (ref <5.7)
Mean Plasma Glucose: 128 mg/dL
eAG (mmol/L): 7.1 mmol/L

## 2021-12-11 LAB — CBC WITH DIFFERENTIAL/PLATELET
Absolute Monocytes: 363 cells/uL (ref 200–950)
Basophils Absolute: 31 cells/uL (ref 0–200)
Basophils Relative: 0.8 %
Eosinophils Absolute: 90 cells/uL (ref 15–500)
Eosinophils Relative: 2.3 %
HCT: 41 % (ref 35.0–45.0)
Hemoglobin: 13.5 g/dL (ref 11.7–15.5)
Lymphs Abs: 1108 cells/uL (ref 850–3900)
MCH: 29.7 pg (ref 27.0–33.0)
MCHC: 32.9 g/dL (ref 32.0–36.0)
MCV: 90.3 fL (ref 80.0–100.0)
MPV: 11.4 fL (ref 7.5–12.5)
Monocytes Relative: 9.3 %
Neutro Abs: 2309 cells/uL (ref 1500–7800)
Neutrophils Relative %: 59.2 %
Platelets: 202 10*3/uL (ref 140–400)
RBC: 4.54 10*6/uL (ref 3.80–5.10)
RDW: 13.4 % (ref 11.0–15.0)
Total Lymphocyte: 28.4 %
WBC: 3.9 10*3/uL (ref 3.8–10.8)

## 2021-12-11 LAB — TSH: TSH: 3.06 mIU/L (ref 0.40–4.50)

## 2021-12-11 LAB — LIPID PANEL
Cholesterol: 255 mg/dL — ABNORMAL HIGH (ref <200)
HDL: 51 mg/dL (ref >=50)
LDL Cholesterol (Calc): 165 mg/dL (calc) — ABNORMAL HIGH
Non-HDL Cholesterol (Calc): 204 mg/dL (calc) — ABNORMAL HIGH (ref <130)
Total CHOL/HDL Ratio: 5 (calc) — ABNORMAL HIGH (ref <5.0)
Triglycerides: 228 mg/dL — ABNORMAL HIGH (ref <150)

## 2021-12-17 ENCOUNTER — Other Ambulatory Visit: Payer: Self-pay

## 2021-12-17 ENCOUNTER — Ambulatory Visit (INDEPENDENT_AMBULATORY_CARE_PROVIDER_SITE_OTHER): Payer: Medicare HMO | Admitting: Family Medicine

## 2021-12-17 ENCOUNTER — Encounter: Payer: Self-pay | Admitting: Family Medicine

## 2021-12-17 VITALS — BP 140/75 | HR 76 | Ht 66.0 in | Wt 212.4 lb

## 2021-12-17 DIAGNOSIS — I10 Essential (primary) hypertension: Secondary | ICD-10-CM

## 2021-12-17 DIAGNOSIS — E7849 Other hyperlipidemia: Secondary | ICD-10-CM

## 2021-12-17 DIAGNOSIS — Z0000 Encounter for general adult medical examination without abnormal findings: Secondary | ICD-10-CM

## 2021-12-17 DIAGNOSIS — E1169 Type 2 diabetes mellitus with other specified complication: Secondary | ICD-10-CM

## 2021-12-17 NOTE — Assessment & Plan Note (Addendum)
Elevated BP then repeat manual ?Continue other meds Amlodipine 10 and HCTZ '25mg'$  daily, Irbesartan '300mg'$  daily ?Monitor BP ?Follow-up as planned ?

## 2021-12-17 NOTE — Patient Instructions (Addendum)
Thank you for coming to the office today. ? ?Trial off Gabapentin to see if this could be causing some of your symptoms. ? ?Give it 2-4 weeks ? ?If need to restart that is fine. ? ?We can also consider Neurology consultation for muscle weakness. ? ?Think about Repatha again longer dosing every other month or can space the pen from 2 weeks to 4 weeks. ? ?Recent Labs  ?  04/11/21 ?0829 12/10/21 ?0814  ?HGBA1C 6.0* 6.1*  ? ? ? ?Please schedule a Follow-up Appointment to: Return in about 6 months (around 06/19/2022) for 6 month follow-up DM A1c, HLD, Leg weakness > refer?. ? ?If you have any other questions or concerns, please feel free to call the office or send a message through [place]. You may also schedule an earlier appointment if necessary. ? ?Additionally, you may be receiving a survey about your experience at our office within a few days to 1 week by e-mail or mail. We value your feedback. ? ?Nobie Putnam, DO ?[place] ?

## 2021-12-17 NOTE — Assessment & Plan Note (Signed)
Controlled DM, with A1c 6.0, controlled ?Concern with obesity, HTN, HLD ? ?Plan:  ?1. Continue Metformin IR '500mg'$  BID ?2. Encourage improved lifestyle - low carb, low sugar diet, reduce portion size, continue improving regular exercise ?

## 2021-12-17 NOTE — Assessment & Plan Note (Signed)
Now again off Repatha with myalgia leg symptoms, Uncontrolled cholesterol ?Elevated LDL to 160s ?Failed Statin therapy Simvastatin, Atorvastatin, Rosuvastatin, intermittent dosing and failed Zetia ? ?The 10-year ASCVD risk score (Arnett DK, et al., 2019) is: 58.8% ? ?Plan: ?1. Remain off Aneth for now - reconsider in future ? (previous on '420mg'$  monthly ?2. Encourage improved lifestyle - low carb/cholesterol, reduce portion size, continue improving regular exercise ?

## 2021-12-17 NOTE — Assessment & Plan Note (Signed)
Clinically morbid obesity with BMI >34 with comorbid conditions Hypertension, Hyperlipidemia, Pre-Diabetes, Osteoporosis ? ?Goals and strategy for wt loss diet primarily, low carb reduce portions, gradually increasing home exercise plan ?

## 2021-12-17 NOTE — Progress Notes (Signed)
Subjective:    Patient ID: Yvette [name], female    DOB: 12/05/1945, 76 y.o.   MRN: [id number]  Yvette [name] is a 76 y.o. female presenting on 12/17/2021 for Annual Exam   HPI  Here for Annual Physical and Lab Review.  Type 2 Diabetes / Hyperglycemia / Morbid Obesity BMI >34 She has done well overall. She has new puppy and more active. A1c previously 6.0, now result 6.1 on current labs. Doing well Meds: Metformin 500 BID - Of note she called insurance, Ozempic, Trulicity covered - but she declines to start today Currently on ACEi, ASA 81, statin Lifestyle: Weight down 8 lbs in past 6 months - Diet goal to improve diet still - Exercise: Limited due to arthritis and also more sedentary limited ability to go exercise now with COVID19 pandemic Admits had some polyuria prior, now seems to be reduced  Next apt scheduled 01/2022 [facility] - mild cataract no surgery needed, no DM retinopathy Denies hypoglycemia, visual changes, numbness or tingling.   Elevated Liver Enzymes - resolved.   HYPERLIPIDEMIA / Familial Hyperlipidemia - Reports concerns with cholesterol, prior history elevated. Cannot take Rosuvastatin or Zetia. She has famlilal cholesterol history and hyperTG  She was doing very well on Repatha, she developed leg heaviness and symptoms again similar to the previous statins, in October / November 2022. She had hard time walking, could not lift legs. - She came off of [place] in Dec, Jan, Feb. She is just now getting back on track with walking easier. - She has felt much better overall now past few weeks. Still doesn't have good strength in lower body but she is not falling and she is now walking more and doing chair yoga. - She does admit back in 2016 she may have had issues with gait as well, but now has difference with more weakness  She talked to [employer] about it and is unsure what was causing the symptoms. Lab results show elevated LDL up to 165 again,  previously on Repatha LDL 65 Additional statin therapy failed due to myalgia Atorvastatin, Rosuvastatin, Simvastatin   Vitamin B12 Last lab >2000 elevated, she has been taking B12 supplement every other day Vitamin D lab shows improved >30 now   CHRONIC HTN: Home BP elevated still. Higher in doctors office. Current Meds - Amlodipine 43m daily, Lisinopril 439mdaily, HCTZ 2529maily Reports normally good compliance. Tolerating well, w/o complaints. Admits swelling of feet and hands if inc salt intake Denies CP, dyspnea, HA, dizziness / lightheadedness   [facility] 12/17/2021 04/16/2021 12/26/2020  Decreased Interest 0 0 0  Down, Depressed, Hopeless 0 0 0  PHQ - 2 Score 0 0 0  Altered sleeping 1 1 -  Tired, decreased energy 1 0 -  Change in appetite 0 1 -  Feeling bad or failure about yourself  0 0 -  Trouble concentrating 0 0 -  Moving slowly or fidgety/restless 0 0 -  Suicidal thoughts 0 0 -  PHQ-9 Score 2 2 -  Difficult doing work/chores Not difficult at all Not difficult at all -  Some recent data might be hidden    Past Medical History:  Diagnosis Date   Arthropathy, unspecified, site unspecified    Esophageal reflux    Osteoarthritis    Osteoporosis, unspecified    Other abnormal glucose    Other and unspecified hyperlipidemia    Symptomatic menopausal or female climacteric states    Syncope and  collapse    Past Surgical History:  Procedure Laterality Date   ABDOMINAL HYSTERECTOMY     APPENDECTOMY     BREAST BIOPSY Left 1970's   neg   COLONOSCOPY     CYST REMOVAL TRUNK  11/24/2020   from back   FINGER FRACTURE SURGERY     Social History   Socioeconomic History   Marital status: Widowed    Spouse name: Not on file   Number of children: Not on file   Years of education: Not on file   Highest education level: Not on file  Occupational History   Occupation: retired  Tobacco Use   Smoking status: Former    Packs/day: 1.00    Years: 26.00     Pack years: 26.00    Types: Cigarettes    Quit date: 06/07/1992    Years since quitting: 29.5   Smokeless tobacco: Former  Scientific laboratory technician Use: Never used  Substance and Sexual Activity   Alcohol use: Not Currently    Alcohol/week: 0.0 standard drinks    Comment: occassional   Drug use: No   Sexual activity: Not on file  Other Topics Concern   Not on file  Social History Narrative   Not on file   Social Determinants of Health   Financial Resource Strain: Low Risk    Difficulty of Paying Living Expenses: Not hard at all  Food Insecurity: No Food Insecurity   Worried About Charity fundraiser in the Last Year: Never true   [place] in the Last Year: Never true  Transportation Needs: No Transportation Needs   Lack of Transportation (Medical): No   Lack of Transportation (Non-Medical): No  Physical Activity: Insufficiently Active   Days of Exercise per Week: 5 days   Minutes of Exercise per Session: 20 min  Stress: No Stress Concern Present   Feeling of Stress : Not at all  Social Connections: Not on file  Intimate Partner Violence: Not on file   Family History  Problem Relation Age of Onset   Diabetes Mother    Heart disease Mother    Heart attack Mother    Ulcers Father    Gallstones Father    Heart attack Brother    Current Outpatient Medications on File Prior to Visit  Medication Sig   Acetaminophen (TYLENOL 8 HOUR ARTHRITIS PAIN PO) Take by mouth. 2 in am 1 in pm   albuterol (VENTOLIN HFA) 108 (90 Base) MCG/ACT inhaler Inhale 2 puffs into the lungs every 4 (four) hours as needed for wheezing or shortness of breath (cough).   amLODipine (NORVASC) 10 MG tablet Take 1 tablet (10 mg total) by mouth daily.   aspirin 81 MG tablet Take 81 mg by mouth daily.   beta carotene w/minerals (OCUVITE) tablet Take 1 tablet by mouth daily.   Calcium-Phosphorus-Vitamin D (CITRACAL +D3 PO) Take 1 Dose by mouth 2 (two) times daily.   Co-Enzyme Q-10 100 MG CAPS Take 1  capsule by mouth daily.   fluticasone (FLONASE) 50 MCG/ACT nasal spray Place 2 sprays into both nostrils daily.   gabapentin (NEURONTIN) 100 MG capsule Take 2 capsules (200 mg total) by mouth at bedtime.   hydrochlorothiazide (HYDRODIURIL) 25 MG tablet Take 1 tablet (25 mg total) by mouth daily.   ipratropium (ATROVENT) 0.06 % nasal spray Place 2 sprays into both nostrils 4 (four) times daily. For up to 5-7 days then stop.   irbesartan (AVAPRO) 300 MG tablet Take 1  tablet (300 mg total) by mouth daily. May start with half tab daily for 1-2 weeks then increase to 1 whole tab daily   Krill Oil 300 MG CAPS Take by mouth daily.   loratadine (CLARITIN) 10 MG tablet Take 10 mg by mouth daily.   metFORMIN (GLUCOPHAGE) 500 MG tablet Take 1 tablet (500 mg total) by mouth 2 (two) times daily with a meal.   Multiple Vitamin (MULTIVITAMIN) tablet Take 1 tablet by mouth daily.   naproxen (NAPROSYN) 500 MG tablet Take 1 tablet (500 mg total) by mouth 2 (two) times daily with a meal. For 1-2 weeks then as needed   pantoprazole (PROTONIX) 40 MG tablet Take 1 tablet (40 mg total) by mouth daily before breakfast.   furosemide (LASIX) 20 MG tablet Take 1-2 tablets (20-40 mg total) by mouth daily as needed for fluid or edema. Recommend use up to 3-5 days at a time if needed. (Patient not taking: Reported on 12/26/2020)   Mag Aspart-Potassium Aspart (POTASSIUM & MAGNESIUM ASPARTAT PO) Take 1 tablet by mouth daily. (Patient not taking: Reported on 12/26/2020)   No current facility-administered medications on file prior to visit.    Review of Systems  Constitutional:  Negative for activity change, appetite change, chills, diaphoresis, fatigue and fever.  HENT:  Negative for congestion and hearing loss.   Eyes:  Negative for visual disturbance.  Respiratory:  Negative for cough, chest tightness, shortness of breath and wheezing.   Cardiovascular:  Negative for chest pain, palpitations and leg swelling.   Gastrointestinal:  Negative for abdominal pain, constipation, diarrhea, nausea and vomiting.  Genitourinary:  Negative for dysuria, frequency and hematuria.  Musculoskeletal:  Negative for arthralgias and neck pain.  Skin:  Negative for rash.  Neurological:  Negative for dizziness, weakness, light-headedness, numbness and headaches.  Hematological:  Negative for adenopathy.  Psychiatric/Behavioral:  Negative for behavioral problems, dysphoric mood and sleep disturbance.   Per HPI unless specifically indicated above     Objective:    BP 140/75 (BP Location: Left Arm, Cuff Size: Normal)    Pulse 76    Ht '5\' 6"'  (1.676 m)    Wt 212 lb 6.4 oz (96.3 kg)    SpO2 100%    BMI 34.28 kg/m   Wt Readings from Last 3 Encounters:  12/17/21 212 lb 6.4 oz (96.3 kg)  04/16/21 220 lb 12.8 oz (100.2 kg)  03/15/21 220 lb (99.8 kg)    Physical Exam Vitals and nursing note reviewed.  Constitutional:      General: She is not in acute distress.    Appearance: She is well-developed. She is not diaphoretic.     Comments: Well-appearing, comfortable, cooperative  HENT:     Head: Normocephalic and atraumatic.  Eyes:     General:        Right eye: No discharge.        Left eye: No discharge.     Conjunctiva/sclera: Conjunctivae normal.     Pupils: Pupils are equal, round, and reactive to light.  Neck:     Thyroid: No thyromegaly.     Vascular: No carotid bruit.  Cardiovascular:     Rate and Rhythm: Normal rate and regular rhythm.     Pulses: Normal pulses.     Heart sounds: Normal heart sounds. No murmur heard. Pulmonary:     Effort: Pulmonary effort is normal. No respiratory distress.     Breath sounds: Normal breath sounds. No wheezing or rales.  Abdominal:  General: Bowel sounds are normal. There is no distension.     Palpations: Abdomen is soft. There is no mass.     Tenderness: There is no abdominal tenderness.  Musculoskeletal:        General: No tenderness. Normal range of motion.      Cervical back: Normal range of motion and neck supple.     Right lower leg: No edema.     Left lower leg: No edema.     Comments: Upper / Lower Extremities: - Normal muscle tone, strength bilateral upper extremities 5/5, lower extremities 5/5  Lymphadenopathy:     Cervical: No cervical adenopathy.  Skin:    General: Skin is warm and dry.     Findings: No erythema or rash.  Neurological:     Mental Status: She is alert and oriented to person, place, and time.     Comments: Distal sensation intact to light touch all extremities  Psychiatric:        Mood and Affect: Mood normal.        Behavior: Behavior normal.        Thought Content: Thought content normal.     Comments: Well groomed, good eye contact, normal speech and thoughts    Results for orders placed or performed in visit on 12/07/21  TSH  Result Value Ref Range   TSH 3.06 0.40 - 4.50 mIU/L  Hemoglobin A1c  Result Value Ref Range   Hgb A1c MFr Bld 6.1 (H) <5.7 % of total Hgb   Mean Plasma Glucose 128 mg/dL   eAG (mmol/L) 7.1 mmol/L  CBC with Differential/Platelet  Result Value Ref Range   WBC 3.9 3.8 - 10.8 Thousand/uL   RBC 4.54 3.80 - 5.10 Million/uL   Hemoglobin 13.5 11.7 - 15.5 g/dL   HCT 41.0 35.0 - 45.0 %   MCV 90.3 80.0 - 100.0 fL   MCH 29.7 27.0 - 33.0 pg   MCHC 32.9 32.0 - 36.0 g/dL   RDW 13.4 11.0 - 15.0 %   Platelets 202 140 - 400 Thousand/uL   MPV 11.4 7.5 - 12.5 fL   Neutro Abs 2,309 1,500 - 7,800 cells/uL   Lymphs Abs 1,108 850 - 3,900 cells/uL   Absolute Monocytes 363 200 - 950 cells/uL   Eosinophils Absolute 90 15 - 500 cells/uL   Basophils Absolute 31 0 - 200 cells/uL   Neutrophils Relative % 59.2 %   Total Lymphocyte 28.4 %   Monocytes Relative 9.3 %   Eosinophils Relative 2.3 %   Basophils Relative 0.8 %  Lipid panel  Result Value Ref Range   Cholesterol 255 (H) <200 mg/dL   HDL 51 > OR = 50 mg/dL   Triglycerides 228 (H) <150 mg/dL   LDL Cholesterol (Calc) 165 (H) mg/dL (calc)   Total  CHOL/HDL Ratio 5.0 (H) <5.0 (calc)   Non-HDL Cholesterol (Calc) 204 (H) <130 mg/dL (calc)  COMPLETE METABOLIC PANEL WITH GFR  Result Value Ref Range   Glucose, Bld 146 (H) 65 - 99 mg/dL   BUN 17 7 - 25 mg/dL   Creat 0.60 0.60 - 1.00 mg/dL   eGFR 92 > OR = 60 mL/min/1.62m   BUN/Creatinine Ratio NOT APPLICABLE 6 - 22 (calc)   Sodium 141 135 - 146 mmol/L   Potassium 4.5 3.5 - 5.3 mmol/L   Chloride 103 98 - 110 mmol/L   CO2 27 20 - 32 mmol/L   Calcium 9.8 8.6 - 10.4 mg/dL   Total Protein 6.6 6.1 -  8.1 g/dL   Albumin 4.3 3.6 - 5.1 g/dL   Globulin 2.3 1.9 - 3.7 g/dL (calc)   AG Ratio 1.9 1.0 - 2.5 (calc)   Total Bilirubin 0.6 0.2 - 1.2 mg/dL   Alkaline phosphatase (APISO) 81 37 - 153 U/L   AST 15 10 - 35 U/L   ALT 10 6 - 29 U/L      Assessment & Plan:   Problem List Items Addressed This Visit     Type 2 diabetes mellitus with other specified complication ([place])    Controlled DM, with A1c 6.0, controlled Concern with obesity, HTN, HLD  Plan:  1. Continue Metformin IR 540m BID 2. Encourage improved lifestyle - low carb, low sugar diet, reduce portion size, continue improving regular exercise      Morbid obesity (HCC)    Clinically morbid obesity with BMI >34 with comorbid conditions Hypertension, Hyperlipidemia, Pre-Diabetes, Osteoporosis  Goals and strategy for wt loss diet primarily, low carb reduce portions, gradually increasing home exercise plan      Familial hyperlipidemia    Now again off Repatha with myalgia leg symptoms, Uncontrolled cholesterol Elevated LDL to 160s Failed Statin therapy Simvastatin, Atorvastatin, Rosuvastatin, intermittent dosing and failed Zetia  The 10-year ASCVD risk score (Arnett DK, et al., 2019) is: 58.8%  Plan: 1. [place]ain off [place] now - reconsider in future  (previous on 4224mmonthly 2. Encourage improved lifestyle - low carb/cholesterol, reduce portion size, continue improving regular exercise      Essential hypertension     Elevated BP then repeat manual Continue other meds Amlodipine 10 and HCTZ 2568maily, Irbesartan 300m82mily Monitor BP Follow-up as planned      Other Visit Diagnoses     Annual physical exam    -  Primary       Updated Health Maintenance information Reviewed recent lab results with patient Encouraged improvement to lifestyle with diet and exercise Goal of weight loss  Leg Weakness Ongoing issue seems to have returned now back on Repatha after 3 months. We discussed in detail. Next phases of plan include trial off Gabapentin - question is this causing? Otherwise if ultimately it is Repatha then we can consider restart intermittent dosing and try every other mont, or if 2 week inj can do monthly. See if this works. Consider Neurology referral in future.  No orders of the defined types were placed in this encounter.    Follow up plan: Return in about 6 months (around 06/19/2022) for 6 month follow-up DM A1c, HLD, Leg weakness > refer?.  ANobie Putnam [place] 12/17/2021, 9:36 AM

## 2021-12-19 ENCOUNTER — Telehealth: Payer: Self-pay

## 2021-12-19 NOTE — Telephone Encounter (Signed)
Open in error

## 2021-12-28 ENCOUNTER — Ambulatory Visit (INDEPENDENT_AMBULATORY_CARE_PROVIDER_SITE_OTHER): Payer: Medicare HMO

## 2021-12-28 ENCOUNTER — Other Ambulatory Visit: Payer: Self-pay

## 2021-12-28 VITALS — BP 132/74 | HR 67 | Temp 97.9°F | Ht 70.0 in | Wt 211.6 lb

## 2021-12-28 DIAGNOSIS — Z0000 Encounter for general adult medical examination without abnormal findings: Secondary | ICD-10-CM | POA: Diagnosis not present

## 2021-12-28 NOTE — Patient Instructions (Signed)
Ms. Vandenboom , ?Thank you for taking time to come for your Medicare Wellness Visit. I appreciate your ongoing commitment to your health goals. Please review the following plan we discussed and let me know if I can assist you in the future.  ? ?Screening recommendations/referrals: ?Colonoscopy: aged out ?Mammogram: aged out ?Bone Density: 11/16/12 wants to talk to Dr.K about this ?Recommended yearly ophthalmology/optometry visit for glaucoma screening and checkup ?Recommended yearly dental visit for hygiene and checkup ? ?Vaccinations: ?Influenza vaccine: 09/06/21 ?Pneumococcal vaccine: 07/08/17 ?Tdap vaccine: 03/07/13 ?Shingles vaccine: Shingrix 02/13/18. 04/16/18   ?Covid-19:n/d ? ?Advanced directives: no ? ?Conditions/risks identified: none ? ?Next appointment: Follow up in one year for your annual wellness visit - 01/03/23 @ 1:30 in person ? ? ?Preventive Care 78 Years and Older, Female ?Preventive care refers to lifestyle choices and visits with your health care provider that can promote health and wellness. ?What does preventive care include? ?A yearly physical exam. This is also called an annual well check. ?Dental exams once or twice a year. ?Routine eye exams. Ask your health care provider how often you should have your eyes checked. ?Personal lifestyle choices, including: ?Daily care of your teeth and gums. ?Regular physical activity. ?Eating a healthy diet. ?Avoiding tobacco and drug use. ?Limiting alcohol use. ?Practicing safe sex. ?Taking low-dose aspirin every day. ?Taking vitamin and mineral supplements as recommended by your health care provider. ?What happens during an annual well check? ?The services and screenings done by your health care provider during your annual well check will depend on your age, overall health, lifestyle risk factors, and family history of disease. ?Counseling  ?Your health care provider may ask you questions about your: ?Alcohol use. ?Tobacco use. ?Drug use. ?Emotional  well-being. ?Home and relationship well-being. ?Sexual activity. ?Eating habits. ?History of falls. ?Memory and ability to understand (cognition). ?Work and work Statistician. ?Reproductive health. ?Screening  ?You may have the following tests or measurements: ?Height, weight, and BMI. ?Blood pressure. ?Lipid and cholesterol levels. These may be checked every 5 years, or more frequently if you are over 14 years old. ?Skin check. ?Lung cancer screening. You may have this screening every year starting at age 78 if you have a 30-pack-year history of smoking and currently smoke or have quit within the past 15 years. ?Fecal occult blood test (FOBT) of the stool. You may have this test every year starting at age 78. ?Flexible sigmoidoscopy or colonoscopy. You may have a sigmoidoscopy every 5 years or a colonoscopy every 10 years starting at age 78. ?Hepatitis C blood test. ?Hepatitis B blood test. ?Sexually transmitted disease (STD) testing. ?Diabetes screening. This is done by checking your blood sugar (glucose) after you have not eaten for a while (fasting). You may have this done every 1-3 years. ?Bone density scan. This is done to screen for osteoporosis. You may have this done starting at age 78. ?Mammogram. This may be done every 1-2 years. Talk to your health care provider about how often you should have regular mammograms. ?Talk with your health care provider about your test results, treatment options, and if necessary, the need for more tests. ?Vaccines  ?Your health care provider may recommend certain vaccines, such as: ?Influenza vaccine. This is recommended every year. ?Tetanus, diphtheria, and acellular pertussis (Tdap, Td) vaccine. You may need a Td booster every 10 years. ?Zoster vaccine. You may need this after age 78. ?Pneumococcal 13-valent conjugate (PCV13) vaccine. One dose is recommended after age 78. ?Pneumococcal polysaccharide (PPSV23) vaccine. One dose is  recommended after age 78. ?Talk to your  health care provider about which screenings and vaccines you need and how often you need them. ?This information is not intended to replace advice given to you by your health care provider. Make sure you discuss any questions you have with your health care provider. ?Document Released: 10/20/2015 Document Revised: 06/12/2016 Document Reviewed: 07/25/2015 ?Elsevier Interactive Patient Education ? 2017 [place]. ? ?Fall Prevention in the Home ?Falls can cause injuries. They can happen to people of all ages. There are many things you can do to make your home safe and to help prevent falls. ?What can I do on the outside of my home? ?Regularly fix the edges of walkways and driveways and fix any cracks. ?Remove anything that might make you trip as you walk through a door, such as a raised step or threshold. ?Trim any bushes or trees on the path to your home. ?Use bright outdoor lighting. ?Clear any walking paths of anything that might make someone trip, such as rocks or tools. ?Regularly check to see if handrails are loose or broken. Make sure that both sides of any steps have handrails. ?Any raised decks and porches should have guardrails on the edges. ?Have any leaves, snow, or ice cleared regularly. ?Use sand or salt on walking paths during winter. ?Clean up any spills in your garage right away. This includes oil or grease spills. ?What can I do in the bathroom? ?Use night lights. ?Install grab bars by the toilet and in the tub and shower. Do not use towel bars as grab bars. ?Use non-skid mats or decals in the tub or shower. ?If you need to sit down in the shower, use a plastic, non-slip stool. ?Keep the floor dry. Clean up any water that spills on the floor as soon as it happens. ?Remove soap buildup in the tub or shower regularly. ?Attach bath mats securely with double-sided non-slip rug tape. ?Do not have throw rugs and other things on the floor that can make you trip. ?What can I do in the bedroom? ?Use night  lights. ?Make sure that you have a light by your bed that is easy to reach. ?Do not use any sheets or blankets that are too big for your bed. They should not hang down onto the floor. ?Have a firm chair that has side arms. You can use this for support while you get dressed. ?Do not have throw rugs and other things on the floor that can make you trip. ?What can I do in the kitchen? ?Clean up any spills right away. ?Avoid walking on wet floors. ?Keep items that you use a lot in easy-to-reach places. ?If you need to reach something above you, use a strong step stool that has a grab bar. ?Keep electrical cords out of the way. ?Do not use floor polish or wax that makes floors slippery. If you must use wax, use non-skid floor wax. ?Do not have throw rugs and other things on the floor that can make you trip. ?What can I do with my stairs? ?Do not leave any items on the stairs. ?Make sure that there are handrails on both sides of the stairs and use them. Fix handrails that are broken or loose. Make sure that handrails are as long as the stairways. ?Check any carpeting to make sure that it is firmly attached to the stairs. Fix any carpet that is loose or worn. ?Avoid having throw rugs at the top or bottom of the stairs. If  you do have throw rugs, attach them to the floor with carpet tape. ?Make sure that you have a light switch at the top of the stairs and the bottom of the stairs. If you do not have them, ask someone to add them for you. ?What else can I do to help prevent falls? ?Wear shoes that: ?Do not have high heels. ?Have rubber bottoms. ?Are comfortable and fit you well. ?Are closed at the toe. Do not wear sandals. ?If you use a stepladder: ?Make sure that it is fully opened. Do not climb a closed stepladder. ?Make sure that both sides of the stepladder are locked into place. ?Ask someone to hold it for you, if possible. ?Clearly mark and make sure that you can see: ?Any grab bars or handrails. ?First and last  steps. ?Where the edge of each step is. ?Use tools that help you move around (mobility aids) if they are needed. These include: ?Canes. ?Walkers. ?Scooters. ?Crutches. ?Turn on the lights when you go into a dark area. Replace

## 2021-12-28 NOTE — Progress Notes (Signed)
? ?Subjective:  ? Yvette [name] is a 76 y.o. female who presents for Medicare Annual (Subsequent) preventive examination. ? ?Review of Systems    ? ?  ? ?   ?Objective:  ?  ?Today's Vitals  ? 12/28/21 1351 12/28/21 1353  ?BP: 132/74   ?Pulse: 67   ?Temp: 97.9 ?F (36.6 ?C)   ?TempSrc: Oral   ?SpO2: 97%   ?Weight: 211 lb 9.6 oz (96 kg)   ?Height: '5\' 10"'$  (1.778 m)   ?PainSc:  0-No pain  ? ?Body mass index is 30.36 kg/m?. ? ? ?  12/26/2020  ?  9:08 AM 11/30/2019  ?  3:19 PM 06/06/2015  ?  1:12 PM 06/06/2015  ?  8:31 AM  ?Advanced Directives  ?Does Patient Have a Medical Advance Directive? Yes Yes Yes No;Yes  ?Type of Paramedic of [place];Living will Living will;Healthcare Power of Attorney Living will Living will  ?Does patient want to make changes to medical advance directive?   No - Patient declined   ?Copy of [place] in Chart? No - copy requested No - copy requested No - copy requested No - copy requested  ? ? ?Current Medications (verified) ?Outpatient Encounter Medications as of 12/28/2021  ?Medication Sig  ? Evolocumab with Infusor ([place]) 420 MG/3.5ML SOCT Inject 3.5 mL as directed every thirty (30) days.  ? irbesartan (AVAPRO) 150 MG tablet Take 1 tablet by mouth daily.  ? naproxen (NAPROSYN) 250 MG tablet   ? oxyCODONE (OXY IR/ROXICODONE) 5 MG immediate release tablet Take by mouth.  ? Acetaminophen (TYLENOL 8 HOUR ARTHRITIS PAIN PO) Take by mouth. 2 in am 1 in pm  ? albuterol (VENTOLIN HFA) 108 (90 Base) MCG/ACT inhaler Inhale 2 puffs into the lungs every 4 (four) hours as needed for wheezing or shortness of breath (cough).  ? amLODipine (NORVASC) 10 MG tablet Take 1 tablet (10 mg total) by mouth daily.  ? aspirin 81 MG tablet Take 81 mg by mouth daily.  ? beta carotene w/minerals (OCUVITE) tablet Take 1 tablet by mouth daily.  ? Calcium-Phosphorus-Vitamin D (CITRACAL +D3 PO) Take 1 Dose by mouth 2 (two) times daily.  ? Co-Enzyme Q-10 100 MG CAPS  Take 1 capsule by mouth daily.  ? fluticasone (FLONASE) 50 MCG/ACT nasal spray Place 2 sprays into both nostrils daily.  ? furosemide (LASIX) 20 MG tablet Take 1-2 tablets (20-40 mg total) by mouth daily as needed for fluid or edema. Recommend use up to 3-5 days at a time if needed. (Patient not taking: Reported on 12/26/2020)  ? gabapentin (NEURONTIN) 100 MG capsule Take 2 capsules (200 mg total) by mouth at bedtime.  ? gabapentin (NEURONTIN) 100 MG capsule Take by mouth.  ? hydrochlorothiazide (HYDRODIURIL) 25 MG tablet Take 1 tablet (25 mg total) by mouth daily.  ? ipratropium (ATROVENT) 0.06 % nasal spray Place 2 sprays into both nostrils 4 (four) times daily. For up to 5-7 days then stop.  ? irbesartan (AVAPRO) 300 MG tablet Take 1 tablet (300 mg total) by mouth daily. May start with half tab daily for 1-2 weeks then increase to 1 whole tab daily  ? Krill Oil 300 MG CAPS Take by mouth daily.  ? loratadine (CLARITIN) 10 MG tablet Take 10 mg by mouth daily.  ? Mag Aspart-Potassium Aspart (POTASSIUM & MAGNESIUM ASPARTAT PO) Take 1 tablet by mouth daily. (Patient not taking: Reported on 12/26/2020)  ? metformin (FORTAMET) 500 MG (OSM) 24 hr tablet  Take by mouth.  ? metFORMIN (GLUCOPHAGE) 500 MG tablet Take 1 tablet (500 mg total) by mouth 2 (two) times daily with a meal.  ? Multiple Vitamin (MULTIVITAMIN) tablet Take 1 tablet by mouth daily.  ? naproxen (NAPROSYN) 500 MG tablet Take 1 tablet (500 mg total) by mouth 2 (two) times daily with a meal. For 1-2 weeks then as needed  ? pantoprazole (PROTONIX) 40 MG tablet Take 1 tablet (40 mg total) by mouth daily before breakfast.  ? ?No facility-administered encounter medications on file as of 12/28/2021.  ? ? ?Allergies (verified) ?Simvastatin and Erythromycin  ? ?History: ?Past Medical History:  ?Diagnosis Date  ? Arthropathy, unspecified, site unspecified   ? Esophageal reflux   ? Osteoarthritis   ? Osteoporosis, unspecified   ? Other abnormal glucose   ? Other and  unspecified hyperlipidemia   ? Symptomatic menopausal or female climacteric states   ? Syncope and collapse   ? ?Past Surgical History:  ?Procedure Laterality Date  ? ABDOMINAL HYSTERECTOMY    ? APPENDECTOMY    ? BREAST BIOPSY Left 1970's  ? neg  ? COLONOSCOPY    ? CYST REMOVAL TRUNK  11/24/2020  ? from back  ? FINGER FRACTURE SURGERY    ? ?Family History  ?Problem Relation Age of Onset  ? Diabetes Mother   ? Heart disease Mother   ? Heart attack Mother   ? Ulcers Father   ? Gallstones Father   ? Heart attack Brother   ? ?Social History  ? ?Socioeconomic History  ? Marital status: Widowed  ?  Spouse name: Not on file  ? Number of children: Not on file  ? Years of education: Not on file  ? Highest education level: Not on file  ?Occupational History  ? Occupation: retired  ?Tobacco Use  ? Smoking status: Former  ?  Packs/day: 1.00  ?  Years: 26.00  ?  Pack years: 26.00  ?  Types: Cigarettes  ?  Quit date: 06/07/1992  ?  Years since quitting: 29.5  ? Smokeless tobacco: Former  ?Vaping Use  ? Vaping Use: Never used  ?Substance and Sexual Activity  ? Alcohol use: Not Currently  ?  Alcohol/week: 0.0 standard drinks  ?  Comment: occassional  ? Drug use: No  ? Sexual activity: Not on file  ?Other Topics Concern  ? Not on file  ?Social History Narrative  ? Not on file  ? ?Social Determinants of Health  ? ?Financial Resource Strain: Not on file  ?Food Insecurity: Not on file  ?Transportation Needs: Not on file  ?Physical Activity: Not on file  ?Stress: Not on file  ?Social Connections: Not on file  ? ? ?Tobacco Counseling ?Counseling given: Not Answered ? ? ?Clinical Intake: ? ?Pre-visit preparation completed: Yes ? ?Pain Score: 0-No pain ? ?  ? ?Diabetes: Yes ?CBG done?: No ?Did pt. bring in CBG monitor from home?: No ? ?How often do you need to have someone help you when you read instructions, pamphlets, or other written materials from your doctor or pharmacy?: 1 - Never ? ?Diabetic?yes ?Nutrition Risk Assessment: ? ?Has the  patient had any N/V/D within the last 2 months?  No  ?Does the patient have any non-healing wounds?  No  ?Has the patient had any unintentional weight loss or weight gain?  No  ? ?Diabetes: ? ?Is the patient diabetic?  Yes  ?If diabetic, was a CBG obtained today?  No  ?Did the patient bring in their  glucometer from home?  No  ?How often do you monitor your CBG's? 2-3/week.  ? ?Financial Strains and Diabetes Management: ? ?Are you having any financial strains with the device, your supplies or your medication? No .  ?Does the patient want to be seen by Chronic Care Management for management of their diabetes?  No  ?Would the patient like to be referred to a Nutritionist or for Diabetic Management?  No  ? ?Diabetic Exams: ? ?Diabetic Eye Exam: Completed 12/15/20. Pt has been advised about the importance in completing this exam.  ? ?Diabetic Foot Exam: Completed 04/16/21. Pt has been advised about the importance in completing this exam.  ? ?Interpreter Needed?: No ? ?Information entered by :: Kirke Shaggy, LPN ? ? ?Activities of Daily Living ?   ? View : No data to display.  ?  ?  ?  ? ? ?Patient Care Team: ?Olin Hauser, DO as PCP - General (Family Medicine) ?Delles, Virl Diamond, RPH-CPP as Pharmacist ?Minor, Dalbert Garnet, RN (Inactive) as [facility] ?Rebekah [place], LCSW as Education officer, museum (Licensed Holiday representative) ? ?Indicate any recent Medical Services you may have received from other than [facility] providers in the past year (date may be approximate). ? ?   ?Assessment:  ? This is a routine wellness examination for Yvette [name]. ? ?Hearing/Vision screen ?No results found. ? ?Dietary issues and exercise activities discussed: ?  ? ? Goals Addressed   ?None ?  ? ?Depression Screen ? ?  12/17/2021  ?  9:32 AM 04/16/2021  ?  9:52 AM 12/26/2020  ?  9:09 AM 06/02/2020  ? 10:39 AM 04/07/2020  ? 11:06 AM 12/02/2019  ?  8:53 AM 11/30/2019  ?  3:04 PM  ?PHQ 2/9 Scores  ?PHQ - 2 Score 0 0 0 0 0 0 0   ?PHQ- 9 Score 2 2   0    ?  ?Fall Risk ? ?  12/17/2021  ?  9:32 AM 04/16/2021  ?  9:52 AM 12/26/2020  ?  9:09 AM 06/02/2020  ? 10:38 AM 04/07/2020  ? 11:06 AM  ?Fall Risk   ?Falls in the past year? 0 0 0 0 0  ?Num

## 2022-01-01 ENCOUNTER — Ambulatory Visit: Payer: Medicare HMO

## 2022-01-22 DIAGNOSIS — E119 Type 2 diabetes mellitus without complications: Secondary | ICD-10-CM | POA: Diagnosis not present

## 2022-01-22 DIAGNOSIS — H5203 Hypermetropia, bilateral: Secondary | ICD-10-CM | POA: Diagnosis not present

## 2022-01-22 LAB — HM DIABETES EYE EXAM

## 2022-02-12 ENCOUNTER — Encounter: Payer: Self-pay | Admitting: Family Medicine

## 2022-02-14 ENCOUNTER — Encounter: Payer: Self-pay | Admitting: Family Medicine

## 2022-02-14 ENCOUNTER — Ambulatory Visit (INDEPENDENT_AMBULATORY_CARE_PROVIDER_SITE_OTHER): Payer: Medicare HMO | Admitting: Family Medicine

## 2022-02-14 VITALS — HR 71 | Ht 70.0 in | Wt 209.0 lb

## 2022-02-14 DIAGNOSIS — M47816 Spondylosis without myelopathy or radiculopathy, lumbar region: Secondary | ICD-10-CM | POA: Diagnosis not present

## 2022-02-14 DIAGNOSIS — W57XXXA Bitten or stung by nonvenomous insect and other nonvenomous arthropods, initial encounter: Secondary | ICD-10-CM | POA: Diagnosis not present

## 2022-02-14 DIAGNOSIS — S76212A Strain of adductor muscle, fascia and tendon of left thigh, initial encounter: Secondary | ICD-10-CM

## 2022-02-14 DIAGNOSIS — M5441 Lumbago with sciatica, right side: Secondary | ICD-10-CM | POA: Diagnosis not present

## 2022-02-14 DIAGNOSIS — R21 Rash and other nonspecific skin eruption: Secondary | ICD-10-CM | POA: Diagnosis not present

## 2022-02-14 DIAGNOSIS — G8929 Other chronic pain: Secondary | ICD-10-CM

## 2022-02-14 DIAGNOSIS — S80861A Insect bite (nonvenomous), right lower leg, initial encounter: Secondary | ICD-10-CM | POA: Diagnosis not present

## 2022-02-14 MED ORDER — NAPROXEN 500 MG PO TABS: 500.0000 mg | ORAL_TABLET | Freq: Two times a day (BID) | ORAL | 2 refills | Status: DC

## 2022-02-14 MED ORDER — TRIAMCINOLONE ACETONIDE 0.5 % EX CREA: 1.0000 "application " | TOPICAL_CREAM | Freq: Two times a day (BID) | CUTANEOUS | 1 refills | Status: DC

## 2022-02-14 MED ORDER — DOXYCYCLINE HYCLATE 100 MG PO TABS: 100.0000 mg | ORAL_TABLET | Freq: Two times a day (BID) | ORAL | 0 refills | Status: DC

## 2022-02-14 NOTE — Patient Instructions (Addendum)
Thank you for coming to the office today. ? ?Tick bite ?We will cover for Lyme and other issues just in case ?Start taking Doxycycline antibiotic '100mg'$  twice daily for 10 days. Take with full glass of water and stay upright for at least 30 min after taking, may be seated or standing, but should NOT lay down. This is just a safety precaution, if this medicine does not go all the way down throat well it could cause some burning discomfort to throat and esophagus. ? ?Left Groin pain ?Take Tylenol Extra Strength '500mg'$  x2 = '1000mg'$  twice a day ? ?Recommend trial of Anti-inflammatory with Naproxen (Naprosyn) '500mg'$  tabs - take one with food and plenty of water TWICE daily every day (breakfast and dinner), for next 2 to 4 weeks, then you may take only as needed ?- DO NOT TAKE any ibuprofen, aleve, motrin while you are taking this medicine ?- It is safe to take Tylenol Ext Str '500mg'$  tabs - take 1 to 2 (max dose '1000mg'$ ) every 6 hours as needed for breakthrough pain, max 24 hour daily dose is 6 to 8 tablets or '4000mg'$  ? ?Gabapentin 2 of the '100mg'$  caps at night to help with sciatica pain. ? ?Please schedule a Follow-up Appointment to: Return if symptoms worsen or fail to improve. ? ?If you have any other questions or concerns, please feel free to call the office or send a message through [place]. You may also schedule an earlier appointment if necessary. ? ?Additionally, you may be receiving a survey about your experience at our office within a few days to 1 week by e-mail or mail. We value your feedback. ? ?Nobie Putnam, DO ?[place] ?

## 2022-02-14 NOTE — Progress Notes (Signed)
? ?Subjective:  ? ? Patient ID: Yvette [name], female    DOB: 20-Sep-1946, 76 y.o.   MRN: [id number] ? ?Yvette [name] is a 76 y.o. female presenting on 02/14/2022 for tick bite on leg ? ? ?HPI ? ?Tick Bite, Right lower leg, upper by inner part of knee ?Reports 4/12 tick bite approx, possibly attached for 1-2 days, she removed tick, she cleaned the bite site and used surgical soap and peroxide. She had some itching but no problem but 2 weeks later it developed some rash around it. She has used moisturizer ? ?Leg Weakness / Left Groin Strain / Pain ?Still issues with weakness in legs still. ?Recently 2 weeks ago she over did it in garden and she bends but not stooping, she had pain in Left groin impacting her activity and walking. ?Feels like leg is going to give out. Uses cane just in case ?Gradual improvement ?Taking Tylenol Ext Str 571m x2 in AM and x 2 in PM. Now has phased down to lower dose 1 in AM and 3 at night. It can keep her awake at night at times. ? ? ? ? ?  02/14/2022  ? 10:38 AM 12/28/2021  ?  2:02 PM 12/17/2021  ?  9:32 AM  ?Depression screen PHQ 2/9  ?Decreased Interest 0 0 0  ?Down, Depressed, Hopeless 0 1 0  ?PHQ - 2 Score 0 1 0  ?Altered sleeping 0 0 1  ?Tired, decreased energy _0 ?Change in appetite 1 0 0  ?Feeling bad or failure about yourself  0 0 0  ?Trouble concentrating 0 0 0  ?Moving slowly or fidgety/restless 0 0 0  ?Suicidal thoughts 0 0 0  ?PHQ-9 Score _1 ?Difficult doing work/chores Not difficult at all Not difficult at all Not difficult at all  ? ? ?Social History  ? ?Tobacco Use  ? Smoking status: Former  ?  Packs/day: 1.00  ?  Years: 26.00  ?  Pack years: 26.00  ?  Types: Cigarettes  ?  Quit date: 06/07/1992  ?  Years since quitting: 29.7  ? Smokeless tobacco: Former  ?Vaping Use  ? Vaping Use: Never used  ?Substance Use Topics  ? Alcohol use: Not Currently  ?  Alcohol/week: 0.0 standard drinks  ?  Comment: occassional  ? Drug use: No  ? ? ?Review of Systems ?Per HPI unless  specifically indicated above ? ?   ?Objective:  ?  ?Pulse 71   Ht 5' 10" (1.778 m)   Wt 209 lb (94.8 kg)   SpO2 99%   BMI 29.99 kg/m?   ?Wt Readings from Last 3 Encounters:  ?02/14/22 209 lb (94.8 kg)  ?12/28/21 211 lb 9.6 oz (96 kg)  ?12/17/21 212 lb 6.4 oz (96.3 kg)  ?  ?Physical Exam ?Vitals and nursing note reviewed.  ?Constitutional:   ?   General: She is not in acute distress. ?   Appearance: Normal appearance. She is well-developed. She is obese. She is not diaphoretic.  ?   Comments: Well-appearing, comfortable, cooperative  ?HENT:  ?   Head: Normocephalic and atraumatic.  ?Eyes:  ?   General:     ?   Right eye: No discharge.     ?   Left eye: No discharge.  ?   Conjunctiva/sclera: Conjunctivae normal.  ?Cardiovascular:  ?   Rate and Rhythm: Normal rate.  ?Pulmonary:  ?   Effort: Pulmonary effort is normal.  ?Musculoskeletal:  ?  Comments: Pain reproduced in Left groin inner thigh adductor muscles, pain with movement  ?Skin: ?   General: Skin is warm and dry.  ?   Findings: Rash (R inner aspect of lower leg by knee, rash see picture) present. No erythema.  ?Neurological:  ?   Mental Status: She is alert and oriented to person, place, and time.  ?Psychiatric:     ?   Mood and Affect: Mood normal.     ?   Behavior: Behavior normal.     ?   Thought Content: Thought content normal.  ?   Comments: Well groomed, good eye contact, normal speech and thoughts  ? ? ? ? ?Results for orders placed or performed in visit on 12/07/21  ?TSH  ?Result Value Ref Range  ? TSH 3.06 0.40 - 4.50 mIU/L  ?Hemoglobin A1c  ?Result Value Ref Range  ? Hgb A1c MFr Bld 6.1 (H) <5.7 % of total Hgb  ? Mean Plasma Glucose 128 mg/dL  ? eAG (mmol/L) 7.1 mmol/L  ?CBC with Differential/Platelet  ?Result Value Ref Range  ? WBC 3.9 3.8 - 10.8 Thousand/uL  ? RBC 4.54 3.80 - 5.10 Million/uL  ? Hemoglobin 13.5 11.7 - 15.5 g/dL  ? HCT 41.0 35.0 - 45.0 %  ? MCV 90.3 80.0 - 100.0 fL  ? MCH 29.7 27.0 - 33.0 pg  ? MCHC 32.9 32.0 - 36.0 g/dL  ? RDW  13.4 11.0 - 15.0 %  ? Platelets 202 140 - 400 Thousand/uL  ? MPV 11.4 7.5 - 12.5 fL  ? Neutro Abs 2,309 1,500 - 7,800 cells/uL  ? Lymphs Abs 1,108 850 - 3,900 cells/uL  ? Absolute Monocytes 363 200 - 950 cells/uL  ? Eosinophils Absolute 90 15 - 500 cells/uL  ? Basophils Absolute 31 0 - 200 cells/uL  ? Neutrophils Relative % 59.2 %  ? Total Lymphocyte 28.4 %  ? Monocytes Relative 9.3 %  ? Eosinophils Relative 2.3 %  ? Basophils Relative 0.8 %  ?Lipid panel  ?Result Value Ref Range  ? Cholesterol 255 (H) <200 mg/dL  ? HDL 51 > OR = 50 mg/dL  ? Triglycerides 228 (H) <150 mg/dL  ? LDL Cholesterol (Calc) 165 (H) mg/dL (calc)  ? Total CHOL/HDL Ratio 5.0 (H) <5.0 (calc)  ? Non-HDL Cholesterol (Calc) 204 (H) <130 mg/dL (calc)  ?COMPLETE METABOLIC PANEL WITH GFR  ?Result Value Ref Range  ? Glucose, Bld 146 (H) 65 - 99 mg/dL  ? BUN 17 7 - 25 mg/dL  ? Creat 0.60 0.60 - 1.00 mg/dL  ? eGFR 92 > OR = 60 mL/min/1.4m  ? BUN/Creatinine Ratio NOT APPLICABLE 6 - 22 (calc)  ? Sodium 141 135 - 146 mmol/L  ? Potassium 4.5 3.5 - 5.3 mmol/L  ? Chloride 103 98 - 110 mmol/L  ? CO2 27 20 - 32 mmol/L  ? Calcium 9.8 8.6 - 10.4 mg/dL  ? Total Protein 6.6 6.1 - 8.1 g/dL  ? Albumin 4.3 3.6 - 5.1 g/dL  ? Globulin 2.3 1.9 - 3.7 g/dL (calc)  ? AG Ratio 1.9 1.0 - 2.5 (calc)  ? Total Bilirubin 0.6 0.2 - 1.2 mg/dL  ? Alkaline phosphatase (APISO) 81 37 - 153 U/L  ? AST 15 10 - 35 U/L  ? ALT 10 6 - 29 U/L  ? ?   ?Assessment & Plan:  ? ?Problem List Items Addressed This Visit   ? ? Spondylosis of lumbar region without myelopathy or radiculopathy  ? Relevant Medications  ?  naproxen (NAPROSYN) 500 MG tablet  ? Chronic right-sided low back pain with sciatica  ? Relevant Medications  ? naproxen (NAPROSYN) 500 MG tablet  ? ?Other Visit Diagnoses   ? ? Rash    -  Primary  ? Relevant Medications  ? triamcinolone cream (KENALOG) 0.5 %  ? Tick bite of right lower leg, initial encounter      ? Relevant Medications  ? doxycycline (VIBRA-TABS) 100 MG tablet  ?  Strain of groin, left, initial encounter      ? ?  ?  ?Tick bite ?Delayed rash reaction unsure if erythema migrans not classic but we can cover for lyme ?Use topical cortisone for itching redness ? ?Empiric > Start taking Doxycycline antibiotic 170m twice daily for 10 days. Take with full glass of water and stay upright for at least 30 min after taking, may be seated or standing, but should NOT lay down. This is just a safety precaution, if this medicine does not go all the way down throat well it could cause some burning discomfort to throat and esophagus. ? ?Left Groin pain ?Likely muscle strain with gardening ?No other obvious acute injury or other cause. Seems likely cause for leg giving way or weakness. ? ?Take Tylenol Extra Strength 5055mx2 = [id number] a day - scheduled if possible. ? ?Re order NSAID - Recommend trial of Anti-inflammatory with Naproxen (Naprosyn) 500m23mbs - take one with food and plenty of water TWICE daily every day (breakfast and dinner), for next 2 to 4 weeks, then you may take only as needed_0 ? ?Restart - Gabapentin 2 of the 100mg75ms at night to help with sciatica pain. ? ?Meds ordered this encounter  ?Medications  ? doxycycline (VIBRA-TABS) 100 MG tablet  ?  Sig: Take 1 tablet (100 mg total) by mouth 2 (two) times daily. For 10 days. Take with full glass of water, stay upright 30 min after taking.  ?  Dispense:  20 tablet  ?  Refill:  0  ? naproxen (NAPROSYN) 500 MG tablet  ?  Sig: Take 1 tablet (500 mg total) by mouth 2 (two) times daily with a meal. For 1-2 weeks then as needed  ?  Dispense:  60 tablet  ?  Refill:  2  ? triamcinolone cream (KENALOG) 0.5 %  ?  Sig: Apply 1 application. topically 2 (two) times daily. To affected areas, for up to 2 weeks.  ?  Dispense:  30 g  ?  Refill:  1  ? ? ? ? ?Follow up plan: ?Return if symptoms worsen or fail to improve. ? ? ? ?AlexaNobie Putnam?[facility] ?02/14/2022, 10:43 AM ?

## 2022-02-26 ENCOUNTER — Ambulatory Visit (INDEPENDENT_AMBULATORY_CARE_PROVIDER_SITE_OTHER): Payer: Medicare HMO | Admitting: Licensed Clinical Social Worker

## 2022-02-26 DIAGNOSIS — M159 Polyosteoarthritis, unspecified: Secondary | ICD-10-CM

## 2022-02-26 DIAGNOSIS — E1169 Type 2 diabetes mellitus with other specified complication: Secondary | ICD-10-CM

## 2022-02-26 DIAGNOSIS — F411 Generalized anxiety disorder: Secondary | ICD-10-CM

## 2022-02-26 DIAGNOSIS — I10 Essential (primary) hypertension: Secondary | ICD-10-CM

## 2022-03-05 NOTE — Chronic Care Management (AMB) (Signed)
Chronic Care Management    Clinical Social Work Note  03/05/2022 Name: Yvette [name] MRN: [id number] DOB: 1946-07-27  Yvette [name] is a 76 y.o. year old female who is a primary care patient of Olin Hauser, DO. The CCM team was consulted to assist the patient with chronic disease management and/or care coordination needs related to: Mental Health Counseling and Resources.   Engaged with patient by telephone for follow up visit in response to provider referral for social work chronic care management and care coordination services.   Consent to Services:  The patient was given information about Chronic Care Management services, agreed to services, and gave verbal consent prior to initiation of services.  Please see initial visit note for detailed documentation.   Patient agreed to services and consent obtained.   Assessment: Review of patient past medical history, allergies, medications, and health status, including review of relevant consultants reports was performed today as part of a comprehensive evaluation and provision of chronic care management and care coordination services.     SDOH (Social Determinants of Health) assessments and interventions performed:    Advanced Directives Status: Not addressed in this encounter.  CCM Care Plan  Allergies  Allergen Reactions   Simvastatin Other (See Comments)    Myalgia, muscle aches lower extremity   Erythromycin Nausea And Vomiting    Outpatient Encounter Medications as of 02/26/2022  Medication Sig Note   Acetaminophen (TYLENOL 8 HOUR ARTHRITIS PAIN PO) Take by mouth. 2 in am 1 in pm    albuterol (VENTOLIN HFA) 108 (90 Base) MCG/ACT inhaler Inhale 2 puffs into the lungs every 4 (four) hours as needed for wheezing or shortness of breath (cough).    amLODipine (NORVASC) 10 MG tablet Take 1 tablet (10 mg total) by mouth daily.    aspirin 81 MG tablet Take 81 mg by mouth daily.    beta carotene w/minerals (OCUVITE) tablet  Take 1 tablet by mouth daily.    Calcium-Phosphorus-Vitamin D (CITRACAL +D3 PO) Take 1 Dose by mouth 2 (two) times daily.    Co-Enzyme Q-10 100 MG CAPS Take 1 capsule by mouth daily.    doxycycline (VIBRA-TABS) 100 MG tablet Take 1 tablet (100 mg total) by mouth 2 (two) times daily. For 10 days. Take with full glass of water, stay upright 30 min after taking.    Evolocumab with Infusor ([place]) 420 MG/3.5ML SOCT Inject 3.5 mL as directed every thirty (30) days. (Patient not taking: Reported on 12/28/2021)    fluticasone (FLONASE) 50 MCG/ACT nasal spray Place 2 sprays into both nostrils daily. 11/30/2019: Nightly    furosemide (LASIX) 20 MG tablet Take 1-2 tablets (20-40 mg total) by mouth daily as needed for fluid or edema. Recommend use up to 3-5 days at a time if needed. (Patient not taking: Reported on 12/26/2020)    gabapentin (NEURONTIN) 100 MG capsule Take 2 capsules (200 mg total) by mouth at bedtime.    hydrochlorothiazide (HYDRODIURIL) 25 MG tablet Take 1 tablet (25 mg total) by mouth daily.    ipratropium (ATROVENT) 0.06 % nasal spray Place 2 sprays into both nostrils 4 (four) times daily. For up to 5-7 days then stop.    irbesartan (AVAPRO) 150 MG tablet Take 1 tablet by mouth daily. (Patient not taking: Reported on 12/28/2021)    irbesartan (AVAPRO) 300 MG tablet Take 1 tablet (300 mg total) by mouth daily. May start with half tab daily for 1-2 weeks then increase to 1 whole tab  daily (Patient not taking: Reported on 12/28/2021)    Krill Oil 300 MG CAPS Take by mouth daily.    loratadine (CLARITIN) 10 MG tablet Take 10 mg by mouth daily.    Mag Aspart-Potassium Aspart (POTASSIUM & MAGNESIUM ASPARTAT PO) Take 1 tablet by mouth daily. (Patient not taking: Reported on 12/26/2020)    metformin (FORTAMET) 500 MG (OSM) 24 hr tablet Take by mouth.    metFORMIN (GLUCOPHAGE) 500 MG tablet Take 1 tablet (500 mg total) by mouth 2 (two) times daily with a meal.    Multiple Vitamin  (MULTIVITAMIN) tablet Take 1 tablet by mouth daily.    naproxen (NAPROSYN) 500 MG tablet Take 1 tablet (500 mg total) by mouth 2 (two) times daily with a meal. For 1-2 weeks then as needed    oxyCODONE (OXY IR/ROXICODONE) 5 MG immediate release tablet Take by mouth.    pantoprazole (PROTONIX) 40 MG tablet Take 1 tablet (40 mg total) by mouth daily before breakfast.    triamcinolone cream (KENALOG) 0.5 % Apply 1 application. topically 2 (two) times daily. To affected areas, for up to 2 weeks.    No facility-administered encounter medications on file as of 02/26/2022.    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   Abscess 04/07/2020   Venous insufficiency of both lower extremities 12/01/2019   Suspected sleep apnea 07/13/2018   Former smoker 07/13/2018   Multiple renal cysts 11/12/2017   Vitamin D deficiency 10/15/2017   Spondylosis of lumbar region without myelopathy or radiculopathy 06/10/2017   Chronic right-sided low back pain with sciatica 06/10/2017   Primary osteoarthritis involving multiple joints 06/10/2017   BPPV (benign paroxysmal positional vertigo), right 06/10/2017   Nausea without vomiting 06/10/2017   Environmental and seasonal allergies 02/12/2017   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/2017   Morbid obesity ([place]) 02/12/2017   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   Dupuytren's contracture of left hand 10/14/2016   Anxiety disorder 09/03/2016   GERD (gastroesophageal reflux disease) 09/03/2016   Prophylactic use of raloxifene ([place]) 09/03/2016   Hyperlipidemia associated with type 2 diabetes mellitus ([place]) 09/03/2016   Type 2 diabetes mellitus with other specified complication ([place]) 65/78/4696   Dyspnea 07/23/2013   Essential hypertension     Conditions to be addressed/monitored: HTN, DMII, Anxiety, and Osteoarthritis  Care Plan : General Social Work (Adult)  Updates made by Rebekah Chesterfield,  LCSW since 03/05/2022 12:00 AM     Problem: Coping Skills (General Plan of Care)      Long-Range Goal: Coping Skills Enhanced   Start Date: 01/16/2021  Expected End Date: 06/06/2022  This Visit's Progress: On track  Recent Progress: On track  Priority: Medium  Note:   Current Barriers:  Acute Mental Health needs related to Anxiety and ongoing grief symptoms  Financial constraints related to managing health care expenses and affording bills Limited social support Limited access to food Level of care concerns ADL IADL limitations Limited access to caregiver Inability to perform ADL's independently Inability to perform IADL's independently Lacks knowledge of community resource: available food support resources Clinical Social Work Goal(s):  Over the next 120 days, patient will work with SW bi-monthly by telephone or in person to reduce or manage symptoms related to anxiety Over the next 120 days, client will work with SW to address concerns related to gaining additional support/resources in order to improve self-care, physical health, emotional health and overall well-being.  Interventions: Patient interviewed and appropriate  assessments performed: brief mental health assessment Patient reports recently being diagnosed with lyme disease and compliance with antibiotics. LCSW provided safe environment for patient to process emotions related to diagnosis and commended pt for scheduling appt with PCP soon after she had concerns  CCM LCSW provided validation and encouragement Patient enjoys spending time with family Active listening / Reflection utilized , Emotional Supportive Provided, and Verbalization of feelings encouraged  Discussed plans with patient for ongoing care management follow up and provided patient with direct contact information for care management team CCM LCSW encouraged patient to continue healthy self-care implementation into her routine by eating healthy, drinking more  fluids and stayting active  Collaboration with PCP regarding development and update of comprehensive plan of care as evidenced by provider attestation and co-signature Inter-disciplinary care team collaboration (see longitudinal plan of care) Patient Self Care Activities:  Attend all scheduled provider appointments Call provider office for new concerns or questions Continue utilizing healthy coping skills to assist in management of symptoms      Follow Up Plan: Appointment scheduled for SW follow up with client by phone on: 05/07/22      Christa See, MSW, [facility] Care Management [email or web address] Phone [phone] 8:46 AM

## 2022-03-05 NOTE — Patient Instructions (Addendum)
Visit Information  Thank you for taking time to visit with me today. Please don't hesitate to contact me if I can be of assistance to you before our next scheduled telephone appointment.  Following are the goals we discussed today:  Patient Self Care Activities:  Attend all scheduled provider appointments Call provider office for new concerns or questions Continue utilizing healthy coping skills to assist in management of symptoms  Our next appointment is by telephone on 05/07/22 at 3 PM  Please call the care guide team at [phone] if you need to cancel or reschedule your appointment.   If you are experiencing a Mental Health or [place] or need someone to talk to, please call the Suicide and Crisis Lifeline: 988 call 911   Patient verbalizes understanding of instructions and care plan provided today and agrees to view in [place]. Active MyChart status and patient understanding of how to access instructions and care plan via MyChart confirmed with patient.     Christa See, MSW, [facility] Care Management [email or web address] Phone [phone] 8:47 AM

## 2022-03-06 DIAGNOSIS — I10 Essential (primary) hypertension: Secondary | ICD-10-CM

## 2022-03-06 DIAGNOSIS — M1990 Unspecified osteoarthritis, unspecified site: Secondary | ICD-10-CM

## 2022-03-06 DIAGNOSIS — Z7984 Long term (current) use of oral hypoglycemic drugs: Secondary | ICD-10-CM

## 2022-03-06 DIAGNOSIS — E1169 Type 2 diabetes mellitus with other specified complication: Secondary | ICD-10-CM

## 2022-03-18 ENCOUNTER — Encounter (INDEPENDENT_AMBULATORY_CARE_PROVIDER_SITE_OTHER): Payer: Medicare HMO | Admitting: Family Medicine

## 2022-03-18 DIAGNOSIS — S80861D Insect bite (nonvenomous), right lower leg, subsequent encounter: Secondary | ICD-10-CM | POA: Diagnosis not present

## 2022-03-18 DIAGNOSIS — W57XXXD Bitten or stung by nonvenomous insect and other nonvenomous arthropods, subsequent encounter: Secondary | ICD-10-CM

## 2022-03-18 DIAGNOSIS — M6281 Muscle weakness (generalized): Secondary | ICD-10-CM

## 2022-03-19 NOTE — Telephone Encounter (Signed)
Please see the MyChart message reply(ies) for my assessment and plan.    This patient gave consent for this Medical Advice Message and is aware that it may result in a bill to their insurance company, as well as the possibility of receiving a bill for a co-payment or deductible. They are an established patient, but are not seeking medical advice exclusively about a problem treated during an in person or video visit in the last seven days. I did not recommend an in person or video visit within seven days of my reply.    I spent a total of 8 minutes cumulative time within 7 days through MyChart messaging.  Raeshawn Tafolla, DO   

## 2022-03-19 NOTE — Addendum Note (Signed)
Addended by: Olin Hauser on: 03/19/2022 12:07 PM   Modules accepted: Orders

## 2022-03-20 ENCOUNTER — Other Ambulatory Visit: Payer: Self-pay

## 2022-03-20 DIAGNOSIS — S80861D Insect bite (nonvenomous), right lower leg, subsequent encounter: Secondary | ICD-10-CM

## 2022-03-21 ENCOUNTER — Other Ambulatory Visit: Payer: Medicare HMO

## 2022-03-21 DIAGNOSIS — W57XXXD Bitten or stung by nonvenomous insect and other nonvenomous arthropods, subsequent encounter: Secondary | ICD-10-CM | POA: Diagnosis not present

## 2022-03-21 DIAGNOSIS — S80861D Insect bite (nonvenomous), right lower leg, subsequent encounter: Secondary | ICD-10-CM | POA: Diagnosis not present

## 2022-03-23 LAB — B. BURGDORFI ANTIBODIES: B burgdorferi Ab IgG+IgM: <0.9 index

## 2022-04-02 ENCOUNTER — Ambulatory Visit
Admission: RE | Admit: 2022-04-02 | Discharge: 2022-04-02 | Disposition: A | Discharge status: Home / Self Care | Payer: Medicare HMO | Source: Ambulatory Visit | Attending: Neurology | Admitting: Neurology

## 2022-04-02 ENCOUNTER — Encounter: Payer: Self-pay | Admitting: Neurology

## 2022-04-02 ENCOUNTER — Telehealth: Payer: Self-pay | Admitting: Neurology

## 2022-04-02 ENCOUNTER — Ambulatory Visit: Payer: Medicare HMO | Admitting: Neurology

## 2022-04-02 VITALS — BP 167/88 | HR 73 | Ht 66.0 in | Wt 211.5 lb

## 2022-04-02 DIAGNOSIS — I878 Other specified disorders of veins: Secondary | ICD-10-CM | POA: Diagnosis not present

## 2022-04-02 DIAGNOSIS — M5416 Radiculopathy, lumbar region: Secondary | ICD-10-CM

## 2022-04-02 DIAGNOSIS — R3915 Urgency of urination: Secondary | ICD-10-CM | POA: Diagnosis not present

## 2022-04-02 DIAGNOSIS — M160 Bilateral primary osteoarthritis of hip: Secondary | ICD-10-CM | POA: Diagnosis not present

## 2022-04-02 DIAGNOSIS — M4726 Other spondylosis with radiculopathy, lumbar region: Secondary | ICD-10-CM | POA: Diagnosis not present

## 2022-04-02 DIAGNOSIS — R269 Unspecified abnormalities of gait and mobility: Secondary | ICD-10-CM | POA: Diagnosis not present

## 2022-04-02 MED ORDER — GABAPENTIN 300 MG PO CAPS: 600.0000 mg | ORAL_CAPSULE | Freq: Every evening | ORAL | 11 refills | Status: DC | PRN

## 2022-04-03 ENCOUNTER — Encounter: Payer: Self-pay | Admitting: Neurology

## 2022-04-03 ENCOUNTER — Telehealth: Payer: Self-pay | Admitting: Neurology

## 2022-04-03 ENCOUNTER — Encounter: Payer: Self-pay | Admitting: Family Medicine

## 2022-04-03 DIAGNOSIS — M160 Bilateral primary osteoarthritis of hip: Secondary | ICD-10-CM

## 2022-04-03 DIAGNOSIS — S72001S Fracture of unspecified part of neck of right femur, sequela: Secondary | ICD-10-CM

## 2022-04-03 LAB — ANA W/REFLEX IF POSITIVE: Anti Nuclear Antibody (ANA): NEGATIVE

## 2022-04-03 LAB — CK: Total CK: 82 U/L (ref 32–182)

## 2022-04-03 LAB — SEDIMENTATION RATE: Sed Rate: 11 mm/hr (ref 0–40)

## 2022-04-03 LAB — C-REACTIVE PROTEIN: CRP: 7 mg/L (ref 0–10)

## 2022-04-03 NOTE — Telephone Encounter (Signed)
Please call patient, x-ray bilateral hip and the pelvic showed proximal right femur subcapital fracture, no evidence of displacement, diffuse osteopenia, severe degenerative changes of the lumbar spine and both hips,  Please call patient if she does not have orthopedic surgeon, we can refer her  Evidence of possible constipation, make sure she take laxative     1. Subcapital lucency is noted about the proximal right femur. This suggest a subcapital right hip fracture. No evidence of displacement.   2. Diffuse osteopenia. Severe degenerative changes lumbar spine and both hips. Degenerative changes about both hips is severe.   3.  Large amount of stool noted throughout the colon.   These results will be called to the ordering clinician or representative by the Radiologist Assistant, and communication documented in the PACS or [employer].

## 2022-04-03 NOTE — Telephone Encounter (Signed)
I spoke to the patient. Reviewed her x-ray results with her over the phone. She verbalized understanding. She is agreeable to see an orthopaedic surgeon. She has seen someone in the past but does not remember the physician's name. She will call us back or send a mychart message with that information, if a referral is needed. She knows we are happy to take care of this for her. Denies constipation. Reports regular, daily bowel movements. She still plans to try a mild laxative to clear her system. The ordered cervical, lumbar MRI scans are pending insurance approval. Once completed, we will reach out to her with the findings.

## 2022-04-03 NOTE — Telephone Encounter (Signed)
I spoke to the patient. She would like a referral to see Dr. Skip Estimable at [facility] in [place]. This will be closer to her home. Order will be placed in [place]. I provided her with their phone number.    She is aware we will move forward with the order cervical and lumbar MRI. Based on those results, further tests can be ordered if needed.

## 2022-04-04 ENCOUNTER — Encounter: Payer: Self-pay | Admitting: Neurology

## 2022-04-04 NOTE — Telephone Encounter (Signed)
[employer] medicare Auth: [id number] exp. 04/04/22-05/04/22 sent to [facility]

## 2022-04-08 ENCOUNTER — Telehealth: Payer: Self-pay | Admitting: Neurology

## 2022-04-08 NOTE — Telephone Encounter (Signed)
Referral for Orthopedics sent to [facility] [phone].

## 2022-04-14 ENCOUNTER — Ambulatory Visit
Admission: RE | Admit: 2022-04-14 | Discharge: 2022-04-14 | Disposition: A | Discharge status: Home / Self Care | Payer: Medicare HMO | Source: Ambulatory Visit | Attending: Neurology | Admitting: Neurology

## 2022-04-14 DIAGNOSIS — R269 Unspecified abnormalities of gait and mobility: Secondary | ICD-10-CM | POA: Diagnosis not present

## 2022-04-14 DIAGNOSIS — M5416 Radiculopathy, lumbar region: Secondary | ICD-10-CM

## 2022-04-14 DIAGNOSIS — R3915 Urgency of urination: Secondary | ICD-10-CM | POA: Diagnosis not present

## 2022-04-14 DIAGNOSIS — M47812 Spondylosis without myelopathy or radiculopathy, cervical region: Secondary | ICD-10-CM | POA: Diagnosis not present

## 2022-04-14 DIAGNOSIS — M47816 Spondylosis without myelopathy or radiculopathy, lumbar region: Secondary | ICD-10-CM | POA: Diagnosis not present

## 2022-04-14 DIAGNOSIS — M48061 Spinal stenosis, lumbar region without neurogenic claudication: Secondary | ICD-10-CM | POA: Diagnosis not present

## 2022-04-14 DIAGNOSIS — M5126 Other intervertebral disc displacement, lumbar region: Secondary | ICD-10-CM | POA: Diagnosis not present

## 2022-04-14 DIAGNOSIS — M4802 Spinal stenosis, cervical region: Secondary | ICD-10-CM | POA: Diagnosis not present

## 2022-04-14 DIAGNOSIS — M50221 Other cervical disc displacement at C4-C5 level: Secondary | ICD-10-CM | POA: Diagnosis not present

## 2022-04-15 ENCOUNTER — Encounter: Payer: Self-pay | Admitting: Family Medicine

## 2022-04-16 ENCOUNTER — Telehealth: Payer: Self-pay | Admitting: Neurology

## 2022-04-16 DIAGNOSIS — M5416 Radiculopathy, lumbar region: Secondary | ICD-10-CM

## 2022-04-16 DIAGNOSIS — R269 Unspecified abnormalities of gait and mobility: Secondary | ICD-10-CM

## 2022-04-16 NOTE — Telephone Encounter (Signed)
Please call patient, MRI of lumbar spine showed severe spinal stenosis at L2-3 level, moderate spinal stenosis at L3-4, variable degree of foraminal narrowing  MRI of cervical spine also showed multilevel degenerative changes, moderate canal stenosis at C5-6, with flattening of cord, but no cord signal abnormality  Previous hip x-ray showed severe degenerative changes,  Check to see if she gets the chance to orthopedic surgeon at [facility] clinic, I have forwarded the MRI report to [facility] clinic  I also ordered EMG nerve conduction study prior to refer her to potential surgical decompression of lumbar spine,  IMPRESSION: Multilevel degenerative changes of the lumbar spine, worst at L2-L3 where there is severe spinal canal stenosis, moderate-severe left and moderate right neural foraminal stenosis.   Moderate spinal canal and bilateral neural foraminal stenosis at L3-L4.   Left greater than right subarticular stenosis at L4-L5, contacting the descending left L5 nerve root.   Mild left-sided neural foraminal narrowing at L1-L2 and L5-S1.    Multilevel degenerative changes of the cervical spine, worst at C5-C6 where there is moderate spinal canal stenosis, flattening of the cord and questionable abnormal cord signal which would suggest mild congestive myelopathy. Moderate bilateral neural foraminal stenosis at this level.   Mild spinal canal stenosis at C4-C5 and C6-C7.   Mild bilateral neural foraminal stenosis at C4-C5.   Mild right-sided neural foraminal stenosis at C3-C4.   Bilateral hip x-ray  1. Subcapital lucency is noted about the proximal right femur. This suggest a subcapital right hip fracture. No evidence of displacement.   2. Diffuse osteopenia. Severe degenerative changes lumbar spine and both hips. Degenerative changes about both hips is severe.   3.  Large amount of stool noted throughout the colon.

## 2022-04-16 NOTE — Telephone Encounter (Signed)
I spoke to the patient and provided her with the cervical and lumbar MRI results. Agreeable to proceed with NCV/EMG. Scheduled on 06/19/22.   She has a pending appt w/ orthopaedic 04/19/22. This will be her initial consult for her hip evaluation.

## 2022-04-19 DIAGNOSIS — E1169 Type 2 diabetes mellitus with other specified complication: Secondary | ICD-10-CM | POA: Diagnosis not present

## 2022-04-19 DIAGNOSIS — M160 Bilateral primary osteoarthritis of hip: Secondary | ICD-10-CM | POA: Diagnosis not present

## 2022-04-19 DIAGNOSIS — E785 Hyperlipidemia, unspecified: Secondary | ICD-10-CM | POA: Diagnosis not present

## 2022-05-07 ENCOUNTER — Telehealth: Payer: Medicare HMO

## 2022-05-07 ENCOUNTER — Encounter: Payer: Self-pay | Admitting: Family Medicine

## 2022-05-08 ENCOUNTER — Other Ambulatory Visit: Payer: Self-pay | Admitting: Family Medicine

## 2022-05-08 DIAGNOSIS — K219 Gastro-esophageal reflux disease without esophagitis: Secondary | ICD-10-CM

## 2022-05-09 NOTE — Telephone Encounter (Signed)
Requested Prescriptions  Pending Prescriptions Disp Refills  . pantoprazole (PROTONIX) 40 MG tablet [Pharmacy Med Name: PANTOPRAZOLE SOD DR 40 MG TAB] 90 tablet 0    Sig: Take 1 tablet (40 mg total) by mouth daily before breakfast.     Gastroenterology: Proton Pump Inhibitors Passed - 05/08/2022  3:15 PM      Passed - Valid encounter within last 12 months    Recent Outpatient Visits          2 months ago [place], DO   4 months ago Annual physical exam   [facility] Olin Hauser, DO   1 year ago Hyperlipidemia associated with type 2 diabetes mellitus [facility])   [facility] Olin Hauser, DO   1 year ago Acute non-recurrent frontal sinusitis   [place], DO   1 year ago Annual physical exam   [facility] Olin Hauser, DO      Future Appointments            In 1 month [place], Devonne Doughty, [facility], [facility]

## 2022-05-14 ENCOUNTER — Ambulatory Visit: Payer: Medicare HMO | Admitting: Licensed Clinical Social Worker

## 2022-05-20 NOTE — Patient Instructions (Signed)
Visit Information  Thank you for taking time to visit with me today. Please don't hesitate to contact me if I can be of assistance to you before our next scheduled telephone appointment.  Following are the goals we discussed today:  Patient Self Care Activities:  Attend all scheduled provider appointments Call provider office for new concerns or questions Continue utilizing healthy coping skills to assist in management of symptoms  If you are experiencing a Mental Health or [place] or need someone to talk to, please call the Suicide and Crisis Lifeline: 988 call 911   Patient verbalizes understanding of instructions and care plan provided today and agrees to view in [place]. Active MyChart status and patient understanding of how to access instructions and care plan via MyChart confirmed with patient.     No further follow up required:    Christa See, MSW, [email or web address] Phone [phone] 8:07 AM

## 2022-05-20 NOTE — Chronic Care Management (AMB) (Signed)
Care Management Clinical Social Work Note  05/20/2022 Name: Yvette [name] MRN: [id number] DOB: December 27, 1945  Yvette [name] is a 76 y.o. year old female who is a primary care patient of Olin Hauser, DO.  The Care Management team was consulted for assistance with chronic disease management and coordination needs.  Engaged with patient by telephone for follow up visit in response to provider referral for social work chronic care management and care coordination services  Consent to Services:  Ms. Kozub was given information about Care Management services today including:  Care Management services includes personalized support from designated clinical staff supervised by her physician, including individualized plan of care and coordination with other care providers 24/7 contact phone numbers for assistance for urgent and routine care needs. The patient may stop case management services at any time by phone call to the office staff.  Patient agreed to services and consent obtained.   Assessment: Review of patient past medical history, allergies, medications, and health status, including review of relevant consultants reports was performed today as part of a comprehensive evaluation and provision of chronic care management and care coordination services.  SDOH (Social Determinants of Health) assessments and interventions performed:    Advanced Directives Status: Not addressed in this encounter.  Care Plan  Allergies  Allergen Reactions   Simvastatin Other (See Comments)    Myalgia, muscle aches lower extremity   Erythromycin Nausea And Vomiting    Outpatient Encounter Medications as of 05/14/2022  Medication Sig Note   Acetaminophen (TYLENOL 8 HOUR ARTHRITIS PAIN PO) Take by mouth. 2 in am 1 in pm    albuterol (VENTOLIN HFA) 108 (90 Base) MCG/ACT inhaler Inhale 2 puffs into the lungs every 4 (four) hours as needed for wheezing or shortness of breath (cough).    amLODipine  (NORVASC) 10 MG tablet Take 1 tablet (10 mg total) by mouth daily.    aspirin 81 MG tablet Take 81 mg by mouth daily.    beta carotene w/minerals (OCUVITE) tablet Take 1 tablet by mouth daily.    Calcium-Phosphorus-Vitamin D (CITRACAL +D3 PO) Take 1 Dose by mouth 2 (two) times daily.    Co-Enzyme Q-10 100 MG CAPS Take 1 capsule by mouth daily.    fluticasone (FLONASE) 50 MCG/ACT nasal spray Place 2 sprays into both nostrils daily. 11/30/2019: Nightly    furosemide (LASIX) 20 MG tablet Take 1-2 tablets (20-40 mg total) by mouth daily as needed for fluid or edema. Recommend use up to 3-5 days at a time if needed.    gabapentin (NEURONTIN) 100 MG capsule Take 2 capsules (200 mg total) by mouth at bedtime.    gabapentin (NEURONTIN) 300 MG capsule Take 2 capsules (600 mg total) by mouth at bedtime as needed.    hydrochlorothiazide (HYDRODIURIL) 25 MG tablet Take 1 tablet (25 mg total) by mouth daily.    ipratropium (ATROVENT) 0.06 % nasal spray Place 2 sprays into both nostrils 4 (four) times daily. For up to 5-7 days then stop.    irbesartan (AVAPRO) 150 MG tablet Take 1 tablet by mouth daily.    irbesartan (AVAPRO) 300 MG tablet Take 1 tablet (300 mg total) by mouth daily. May start with half tab daily for 1-2 weeks then increase to 1 whole tab daily    Krill Oil 300 MG CAPS Take by mouth daily.    loratadine (CLARITIN) 10 MG tablet Take 10 mg by mouth daily.    Mag Aspart-Potassium Aspart (POTASSIUM & MAGNESIUM ASPARTAT PO)  Take 1 tablet by mouth daily.    metformin (FORTAMET) 500 MG (OSM) 24 hr tablet Take by mouth.    metFORMIN (GLUCOPHAGE) 500 MG tablet Take 1 tablet (500 mg total) by mouth 2 (two) times daily with a meal.    Multiple Vitamin (MULTIVITAMIN) tablet Take 1 tablet by mouth daily.    naproxen (NAPROSYN) 500 MG tablet Take 1 tablet (500 mg total) by mouth 2 (two) times daily with a meal. For 1-2 weeks then as needed    pantoprazole (PROTONIX) 40 MG tablet Take 1 tablet (40 mg total)  by mouth daily before breakfast.    No facility-administered encounter medications on file as of 05/14/2022.    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   Abscess 04/07/2020   Venous insufficiency of both lower extremities 12/01/2019   Suspected sleep apnea 07/13/2018   Former smoker 07/13/2018   Multiple renal cysts 11/12/2017   Vitamin D deficiency 10/15/2017   Spondylosis of lumbar region without myelopathy or radiculopathy 06/10/2017   Chronic right-sided low back pain with sciatica 06/10/2017   Primary osteoarthritis involving multiple joints 06/10/2017   BPPV (benign paroxysmal positional vertigo), right 06/10/2017   Nausea without vomiting 06/10/2017   Environmental and seasonal allergies 02/12/2017   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/2017   Morbid obesity ([place]) 02/12/2017   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   Dupuytren's contracture of left hand 10/14/2016   Anxiety disorder 09/03/2016   GERD (gastroesophageal reflux disease) 09/03/2016   Prophylactic use of raloxifene ([place]) 09/03/2016   Hyperlipidemia associated with type 2 diabetes mellitus ([place]) 09/03/2016   Type 2 diabetes mellitus with other specified complication ([place]) 56/21/3086   Dyspnea 07/23/2013   Essential hypertension     Conditions to be addressed/monitored: DMII, Anxiety, and Osteoarthritis  Care Plan : General Social Work (Adult)  Updates made by Rebekah Chesterfield, LCSW since 05/20/2022 12:00 AM     Problem: Coping Skills (General Plan of Care)      Long-Range Goal: Coping Skills Enhanced Completed 05/14/2022  Start Date: 01/16/2021  Expected End Date: 06/06/2022  This Visit's Progress: On track  Recent Progress: On track  Priority: Medium  Note:   Current Barriers:  Acute Mental Health needs related to Anxiety and ongoing grief  symptoms  Financial constraints related to managing health care expenses and affording bills Limited social support Limited access to food Level of care concerns ADL IADL limitations Limited access to caregiver Inability to perform ADL's independently Inability to perform IADL's independently Lacks knowledge of community resource: available food support resources Clinical Social Work Goal(s):  Over the next 120 days, patient will work with SW bi-monthly by telephone or in person to reduce or manage symptoms related to anxiety Over the next 120 days, client will work with SW to address concerns related to gaining additional support/resources in order to improve self-care, physical health, emotional health and overall well-being.  Interventions: Patient interviewed and appropriate assessments performed: brief mental health assessment Patient reports that she is doing "pretty good" Pt continues to be active, going to church, socialize with friends, and spending time with her dog, [place] Patient identified healthy strategies to continue working with providers to make informed decisions regarding tx plans CCM LCSW provided validation and encouragement Active listening / Reflection utilized , Emotional Supportive Provided, and Verbalization of feelings encouraged  Discussed plans with patient  for ongoing care management follow up and provided patient with direct contact information for care management team CCM LCSW encouraged patient to continue healthy self-care implementation into her routine by eating healthy, drinking more fluids and stayting active  Inter-disciplinary care team collaboration (see longitudinal plan of care) Patient Self Care Activities:  Attend all scheduled provider appointments Call provider office for new concerns or questions Continue utilizing healthy coping skills to assist in management of symptoms      Follow Up Plan: No follow up required  Christa See, MSW,  [email or web address] Phone [phone] 8:06 AM

## 2022-05-21 ENCOUNTER — Telehealth: Payer: Medicare HMO

## 2022-05-27 ENCOUNTER — Ambulatory Visit: Payer: Medicare HMO | Admitting: Neurology

## 2022-06-18 ENCOUNTER — Ambulatory Visit: Payer: Medicare HMO | Admitting: Family Medicine

## 2022-06-19 ENCOUNTER — Ambulatory Visit: Payer: Medicare HMO | Admitting: Neurology

## 2022-06-19 ENCOUNTER — Ambulatory Visit (INDEPENDENT_AMBULATORY_CARE_PROVIDER_SITE_OTHER): Payer: Medicare HMO | Admitting: Neurology

## 2022-06-19 VITALS — BP 198/98 | Ht 67.0 in | Wt 209.0 lb

## 2022-06-19 DIAGNOSIS — Z0289 Encounter for other administrative examinations: Secondary | ICD-10-CM

## 2022-06-19 DIAGNOSIS — M5416 Radiculopathy, lumbar region: Secondary | ICD-10-CM

## 2022-06-19 DIAGNOSIS — M25552 Pain in left hip: Secondary | ICD-10-CM | POA: Diagnosis not present

## 2022-06-19 DIAGNOSIS — R269 Unspecified abnormalities of gait and mobility: Secondary | ICD-10-CM

## 2022-06-19 NOTE — Progress Notes (Signed)
EMG is under procedure tab 

## 2022-06-19 NOTE — Procedures (Signed)
Full Name: Yvette [name] Gender: Female MRN #: [id number] Date of Birth: 10-23-1945    Visit Date: 06/19/2022 10:12 Age: 76 Years Examining Physician: Marcial Pacas Referring Physician: Marcial Pacas Height: 5 feet 7 inch History: 76 year old female presenting with gradual worsening gait abnormality, significant left hip pain, intermittent low back pain,  Summary of the test: Nerve conduction study: Bilateral sural sensory responses were normal, bilateral superficial peroneal sensory responses showed mildly decreased snap amplitude. Left peroneal to EDB motor response was absent, there was underdevelopment of left to EDB muscle.  Right peroneal to EDB showed mildly decreased CMAP amplitude, was otherwise normal. Right tibial motor responses were normal.  Left tibial motor responses showed moderately decreased CMAP amplitude.  Electromyography: Selected needle examinations were performed at bilateral lower extremity muscles, bilateral lumbosacral paraspinal muscles.  There was evidence of chronic neuropathic changes involving bilateral tibialis anterior, vastus lateralis, there was increased activity at bilateral lumbar paraspinal muscles, polyphasic motor unit potential, no evidence of active denervation.  Conclusion: This is an abnormal study, There is evidence of bilateral lumbosacral radiculopathy mainly involving bilateral L4 myotomes.  There is no evidence of active process, there is no evidence of large fiber peripheral neuropathy.  There is no evidence of intrinsic muscle disease.    ------------------------------- Marcial Pacas, M.D. Ph.D.  [facility] [address] [place], [place] [address] Tel: [phone] Fax: [phone]  Verbal informed consent was obtained from the patient, patient was informed of potential risk of procedure, including bruising, bleeding, hematoma formation, infection, muscle weakness, muscle pain, numbness, among others.         [place]    Nerve / Sites Muscle Latency Ref. Amplitude Ref. Rel Amp Segments Distance Velocity Ref. Area    ms ms mV mV %  cm m/s m/s mVms  R Peroneal - EDB     Ankle EDB 5.9 ?6.5 1.5 ?2.0 100 Ankle - EDB 9   3.6     Fib head EDB 12.8  1.7  116 Fib head - Ankle 29 42 ?44 5.6     Pop fossa EDB 15.5  1.6  95.9 Pop fossa - Fib head 12 43 ?44 5.9         Pop fossa - Ankle      L Peroneal - EDB     Ankle EDB NR ?6.5 NR ?2.0 NR Ankle - EDB 9   NR         Pop fossa - Ankle      R Tibial - AH     Ankle AH 5.1 ?5.8 6.6 ?4.0 100 Ankle - AH 9   12.2     Pop fossa AH 16.1  6.1  93.3 Pop fossa - Ankle 46 42 ?41 13.9  L Tibial - AH     Ankle AH 5.1 ?5.8 1.5 ?4.0 100 Ankle - AH 9   3.5     Pop fossa AH 15.6  1.4  94.7 Pop fossa - Ankle 42 40 ?41 2.4             SNC    Nerve / Sites Rec. Site Peak Lat Ref.  Amp Ref. Segments Distance    ms ms V V  cm  R Sural - Ankle (Calf)     Calf Ankle 3.9 ?4.4 8 ?6 Calf - Ankle 14  L Sural - Ankle (Calf)     Calf Ankle 4.1 ?4.4 5 ?6 Calf - Ankle 14  R  Superficial peroneal - Ankle     Lat leg Ankle 3.3 ?4.4 2 ?6 Lat leg - Ankle 14  L Superficial peroneal - Ankle     Lat leg Ankle 4.4 ?4.4 3 ?6 Lat leg - Ankle 14             F  Wave    Nerve F Lat Ref.   ms ms  R Tibial - AH 57.9 ?56.0  L Tibial - AH 56.1 ?56.0         EMG Summary Table    Spontaneous MUAP Recruitment  Muscle IA Fib PSW Fasc Other Amp Dur. Poly Pattern  R. Tibialis anterior Normal None None None _______ Increased Increased Normal Reduced  R. Tibialis posterior Normal None None None _______ Normal Normal Normal Normal  R. Peroneus longus Normal None None None _______ Normal Normal Normal Normal  R. Gastrocnemius (Medial head) Normal None None None _______ Normal Normal Normal Normal  R. Vastus lateralis Normal None None None _______ Increased Normal Normal Reduced  R. Gluteus medius Normal None None None _______ Normal Normal Normal Normal  R. Lumbar paraspinals (low)  Increased None None None _______ Decreased Normal 1+ Normal  R. Lumbar paraspinals (mid) Increased None None None _______ Decreased Normal 1+ Normal  L. Tibialis posterior Normal None None None _______ Normal Normal Normal Normal  L. Tibialis anterior Normal None None None _______ Increased Normal Normal Reduced  L. Peroneus longus Normal None None None _______ Normal Normal Normal Normal  L. Gastrocnemius (Medial head) Normal None None None _______ Normal Normal Normal Normal  L. Vastus lateralis Normal None None None _______ Increased Normal Normal Reduced  L. Lumbar paraspinals (low) Increased None None None _______ Normal Normal Normal Normal  L. Lumbar paraspinals (mid) Increased None None None _______ Normal Normal Normal Normal

## 2022-06-19 NOTE — Progress Notes (Signed)
No chief complaint on file.     ASSESSMENT AND PLAN  Yvette [name] is a 76 y.o. female   Slow progressive gait abnormality Worsening left hip pain,  X-ray of left hip showed severe degenerative changes of left hip,  MRI of lumbar spine showed some near spinal stenosis at L2-3, moderate at L3-4, with variable degree of foraminal narrowing  MRI of cervical spine showed multilevel degenerative changes, flattening of the cord at C5-6,   Patient major limitation is from her left hip pain, EMG nerve conduction study today showed mild chronic bilateral L4 lumbar radiculopathy, no evidence of active process, there was no significant muscle weakness on today's examination, patient has establish care with local orthopedic surgeon, planning for left hip replacement, I would hope that once her left hip pain issue has improved, her gait will be able to bounce back to her baseline      DIAGNOSTIC DATA (LABS, IMAGING, TESTING) - I reviewed patient records, labs, notes, testing and imaging myself where available.   MEDICAL HISTORY:  Yvette [name] is a 76 year old female, seen in request by her primary care doctor Olin Hauser, for evaluation of gait abnormality, multiple joints pain, initial evaluation was on April 02, 2022  I reviewed and summarized the referring note. PMHx. HTN DM GERD  Patient reported around 1990s, she began to notice her stride change, she walks slower, was seen by primary care at that time, had multiple imaging taken, was told she has multiple arthritis, over the years, she has intermittent low back pain, multiple joints pain, hip pain, was managed by daily Tylenol plus naproxen twice a day  She developed hyperlipidemia, in early 2020, she was given few trial of statin, she could not tolerated, reported increased gait abnormality, achy pain, eventually was given Repetha injection from April to November 2022, initially she thought she was doing better, but she  has to stop the medication because by the end of 2022 she noticed worsening gait abnormality, earlier 2022 she was able to walk with her puppy couple miles a day, then noticed increased gait abnormality, now she has to take frequent breaks, up to stop taking the statin medications, her gait continues to decline, in addition she complains of worsening urinary urgency, frequency, bilateral feet numbness ascending to above ankle level  She denies significant neck pain, denies upper extremity paresthesia or weakness,  Laboratory evaluation in 2023, normal TSH, A1c 6.1, normal CBC hemoglobin of 13.5, normal CMP with exception of mild elevated glucose, negative Lyme titer, lipid panel elevated LDL 165, total cholesterol 255  I personally reviewed MRI lumbar in February 2019, diffuse lumbar spine spondylosis, L2-3 ,L3-4 with mild stenosis  Update June 19, 2022 She is accompanied by her daughter at today's clinical visit, EMG nerve conduction study today showed no evidence of large fiber peripheral neuropathy, there is evidence of chronic bilateral L4 radiculopathy, no evidence of active process, no evidence of intrinsic muscle disease  We personally reviewed MRI of cervical spine April 16, 2022, multilevel degenerative changes most noticeable at C5-6, moderate canal stenosis flattening of the cord  MRI of lumbar spine, multilevel degenerative changes worst at L2-3 with severe spinal stenosis, moderate at L3-4, variable degree of foraminal narrowing  Pelvic x-ray showed loss of joint space of both hips, severe degenerative changes, left worse than right, diffuse osteopenia  Laboratory evaluation showed normal negative CPK, ANA, ESR, C-reactive protein, Lyme titer  PHYSICAL EXAM: 180/90  PHYSICAL EXAMNIATION:  Gen: NAD, conversant,  well nourised, well groomed                     Cardiovascular: Regular rate rhythm, no peripheral edema, warm, nontender. Eyes: Conjunctivae clear without exudates  or hemorrhage Neck: Supple, no carotid bruits. Pulmonary: Clear to auscultation bilaterally   NEUROLOGICAL EXAM:  MENTAL STATUS: Speech/cognition: Awake, alert, oriented to history taking and casual conversation CRANIAL NERVES: CN II: Visual fields are full to confrontation. Pupils are round equal and briskly reactive to light. CN III, IV, VI: extraocular movement are normal. No ptosis. CN V: Facial sensation is intact to light touch CN VII: Face is symmetric with normal eye closure  CN VIII: Hearing is normal to causal conversation. CN IX, X: Phonation is normal. CN XI: Head turning and shoulder shrug are intact  MOTOR: Slight left shoulder abduction, external rotation weakness due to left shoulder pain,  There was no significant bilateral hip flexion weakness noted today, normal knee flexion extension, ankle dorsi and plantarflexion  REFLEXES: Reflexes are 2 and symmetric at the biceps, triceps, absent at bilateral knees, and ankles. Plantar responses are extensor bilaterally  SENSORY: Length-dependent decreased light touch, pinprick, vibratory sensation to mid shin level  COORDINATION: There is no trunk or limb dysmetria noted.  GAIT/STANCE: She needs push-up to get up from seated position, antalgic, swinging left hip  REVIEW OF SYSTEMS:  Full 14 system review of systems performed and notable only for as above All other review of systems were negative.   ALLERGIES: Allergies  Allergen Reactions   Simvastatin Other (See Comments)    Myalgia, muscle aches lower extremity   Erythromycin Nausea And Vomiting    HOME MEDICATIONS: Current Outpatient Medications  Medication Sig Dispense Refill   Acetaminophen (TYLENOL 8 HOUR ARTHRITIS PAIN PO) Take by mouth. 2 in am 1 in pm     albuterol (VENTOLIN HFA) 108 (90 Base) MCG/ACT inhaler Inhale 2 puffs into the lungs every 4 (four) hours as needed for wheezing or shortness of breath (cough). 8.5 g 2   amLODipine (NORVASC) 10 MG  tablet Take 1 tablet (10 mg total) by mouth daily. 90 tablet 3   aspirin 81 MG tablet Take 81 mg by mouth daily.     beta carotene w/minerals (OCUVITE) tablet Take 1 tablet by mouth daily.     Calcium-Phosphorus-Vitamin D (CITRACAL +D3 PO) Take 1 Dose by mouth 2 (two) times daily.     Co-Enzyme Q-10 100 MG CAPS Take 1 capsule by mouth daily.     fluticasone (FLONASE) 50 MCG/ACT nasal spray Place 2 sprays into both nostrils daily. 16 g 5   furosemide (LASIX) 20 MG tablet Take 1-2 tablets (20-40 mg total) by mouth daily as needed for fluid or edema. Recommend use up to 3-5 days at a time if needed. 30 tablet 2   gabapentin (NEURONTIN) 100 MG capsule Take 2 capsules (200 mg total) by mouth at bedtime. 180 capsule 1   gabapentin (NEURONTIN) 300 MG capsule Take 2 capsules (600 mg total) by mouth at bedtime as needed. 60 capsule 11   hydrochlorothiazide (HYDRODIURIL) 25 MG tablet Take 1 tablet (25 mg total) by mouth daily. 90 tablet 3   ipratropium (ATROVENT) 0.06 % nasal spray Place 2 sprays into both nostrils 4 (four) times daily. For up to 5-7 days then stop. 15 mL 0   irbesartan (AVAPRO) 150 MG tablet Take 1 tablet by mouth daily.     irbesartan (AVAPRO) 300 MG tablet Take 1 tablet (300  mg total) by mouth daily. May start with half tab daily for 1-2 weeks then increase to 1 whole tab daily 90 tablet 3   Krill Oil 300 MG CAPS Take by mouth daily.     loratadine (CLARITIN) 10 MG tablet Take 10 mg by mouth daily.     Mag Aspart-Potassium Aspart (POTASSIUM & MAGNESIUM ASPARTAT PO) Take 1 tablet by mouth daily.     metformin (FORTAMET) 500 MG (OSM) 24 hr tablet Take by mouth.     metFORMIN (GLUCOPHAGE) 500 MG tablet Take 1 tablet (500 mg total) by mouth 2 (two) times daily with a meal. 180 tablet 3   Multiple Vitamin (MULTIVITAMIN) tablet Take 1 tablet by mouth daily.     naproxen (NAPROSYN) 500 MG tablet Take 1 tablet (500 mg total) by mouth 2 (two) times daily with a meal. For 1-2 weeks then as needed  60 tablet 2   pantoprazole (PROTONIX) 40 MG tablet Take 1 tablet (40 mg total) by mouth daily before breakfast. 90 tablet 1   No current facility-administered medications for this visit.    PAST MEDICAL HISTORY: Past Medical History:  Diagnosis Date   Arthropathy, unspecified, site unspecified    Esophageal reflux    Osteoarthritis    Osteoporosis, unspecified    Other abnormal glucose    Other and unspecified hyperlipidemia    Symptomatic menopausal or female climacteric states    Syncope and collapse     PAST SURGICAL HISTORY: Past Surgical History:  Procedure Laterality Date   ABDOMINAL HYSTERECTOMY     APPENDECTOMY     BREAST BIOPSY Left 1970's   neg   COLONOSCOPY     CYST REMOVAL TRUNK  11/24/2020   from back   FINGER FRACTURE SURGERY      FAMILY HISTORY: Family History  Problem Relation Age of Onset   Diabetes Mother    Heart disease Mother    Heart attack Mother    Ulcers Father    Gallstones Father    Heart attack Brother     SOCIAL HISTORY: Social History   Socioeconomic History   Marital status: Widowed    Spouse name: Not on file   Number of children: Not on file   Years of education: Not on file   Highest education level: Not on file  Occupational History   Occupation: retired  Tobacco Use   Smoking status: Former    Packs/day: 1.00    Years: 26.00    Total pack years: 26.00    Types: Cigarettes    Quit date: 06/07/1992    Years since quitting: 30.0   Smokeless tobacco: Former  Scientific laboratory technician Use: Never used  Substance and Sexual Activity   Alcohol use: Not Currently    Alcohol/week: 0.0 standard drinks of alcohol    Comment: occassional   Drug use: No   Sexual activity: Not on file  Other Topics Concern   Not on file  Social History Narrative   Not on file   Social Determinants of Health   Financial Resource Strain: Low Risk  (12/28/2021)   Overall Financial Resource Strain (CARDIA)    Difficulty of Paying Living Expenses:  Not hard at all  Food Insecurity: No Food Insecurity (12/28/2021)   Hunger Vital Sign    Worried About Running Out of Food in the Last Year: Never true    [place] in the Last Year: Never true  Transportation Needs: No Transportation Needs (12/28/2021)  PRAPARE - Hydrologist (Medical): No    Lack of Transportation (Non-Medical): No  Physical Activity: Insufficiently Active (12/28/2021)   Exercise Vital Sign    Days of Exercise per Week: 7 days    Minutes of Exercise per Session: 20 min  Stress: No Stress Concern Present (12/28/2021)   [place]    Feeling of Stress : Not at all  Social Connections: Socially Isolated (12/28/2021)   Social Connection and Isolation Panel [NHANES]    Frequency of Communication with Friends and Family: Once a week    Frequency of Social Gatherings with Friends and Family: Once a week    Attends Religious Services: Never    Marine scientist or Organizations: No    Attends Archivist Meetings: Never    Marital Status: Widowed  Intimate Partner Violence: Not At Risk (12/28/2021)   Humiliation, Afraid, Rape, and Kick questionnaire    Fear of Current or Ex-Partner: No    Emotionally Abused: No    Physically Abused: No    Sexually Abused: No      Marcial Pacas, M.D. Ph.D.  [facility] [address], [place], [place] [address] Ph: [phone] Fax: [phone]  CC:  Olin Hauser, DO [address] [place],  [place] [address]  Olin Hauser, DO

## 2022-07-03 ENCOUNTER — Encounter: Payer: Self-pay | Admitting: Family Medicine

## 2022-07-03 ENCOUNTER — Ambulatory Visit (INDEPENDENT_AMBULATORY_CARE_PROVIDER_SITE_OTHER): Payer: Medicare HMO | Admitting: Family Medicine

## 2022-07-03 ENCOUNTER — Other Ambulatory Visit: Payer: Self-pay | Admitting: Family Medicine

## 2022-07-03 VITALS — BP 135/70 | HR 80 | Ht 67.0 in | Wt 210.6 lb

## 2022-07-03 DIAGNOSIS — E1169 Type 2 diabetes mellitus with other specified complication: Secondary | ICD-10-CM | POA: Diagnosis not present

## 2022-07-03 DIAGNOSIS — I10 Essential (primary) hypertension: Secondary | ICD-10-CM

## 2022-07-03 DIAGNOSIS — Z0000 Encounter for general adult medical examination without abnormal findings: Secondary | ICD-10-CM

## 2022-07-03 DIAGNOSIS — G8929 Other chronic pain: Secondary | ICD-10-CM | POA: Diagnosis not present

## 2022-07-03 DIAGNOSIS — M5441 Lumbago with sciatica, right side: Secondary | ICD-10-CM | POA: Diagnosis not present

## 2022-07-03 DIAGNOSIS — M47816 Spondylosis without myelopathy or radiculopathy, lumbar region: Secondary | ICD-10-CM

## 2022-07-03 DIAGNOSIS — F411 Generalized anxiety disorder: Secondary | ICD-10-CM | POA: Diagnosis not present

## 2022-07-03 DIAGNOSIS — E7849 Other hyperlipidemia: Secondary | ICD-10-CM

## 2022-07-03 LAB — POCT GLYCOSYLATED HEMOGLOBIN (HGB A1C): Hemoglobin A1C: 6.3 % — AB (ref 4.0–5.6)

## 2022-07-03 MED ORDER — NAPROXEN 500 MG PO TABS: 500.0000 mg | ORAL_TABLET | Freq: Two times a day (BID) | ORAL | 2 refills | Status: DC

## 2022-07-03 NOTE — Patient Instructions (Addendum)
Thank you for coming to the office today.  Recent Labs    12/10/21 0814 07/03/22 1130  HGBA1C 6.1* 6.3*   No change today on treatment. We can reconsider injection therapy for newer med in future  Mounjaro, Ozempic, Trulicity, Rybelsus (oral)  Rybelsus   Starting dose is '3mg'$  once daily, first thing in morning on empty stomach, sip of water, no other medicines. Wait 30 min before first meal of day.  After 30 days we need to increase dose up to 7 mg - call us to request new rx.  While on first 30 days you can keep metformin, then we would phase off of it once we dose increase  Please schedule a Follow-up Appointment to: Return in about 6 months (around 01/01/2023) for 6 month fasting lab only then 1 week later Annual Physical.  If you have any other questions or concerns, please feel free to call the office or send a message through [place]. You may also schedule an earlier appointment if necessary.  Additionally, you may be receiving a survey about your experience at our office within a few days to 1 week by e-mail or mail. We value your feedback.  Nobie Putnam, DO [place]

## 2022-07-03 NOTE — Assessment & Plan Note (Signed)
Elevated BP then repeat manual improved, home readings even better Continue other meds Amlodipine 10 and HCTZ '25mg'$  daily, Irbesartan '300mg'$  daily Monitor BP Follow-up as planned

## 2022-07-03 NOTE — Progress Notes (Signed)
Subjective:    Patient ID: Yvette [name], female    DOB: 05-12-1946, 76 y.o.   MRN: [id number]  Yvette [name] is a 76 y.o. female presenting on 07/03/2022 for Diabetes and Hyperlipidemia   HPI  Type 2 Diabetes / Hyperglycemia / Morbid Obesity BMI >34 She has done well overall.  A1c 6.3, slight increase from prior Meds: Metformin 500 BID - Of note she called insurance, Ozempic, Trulicity covered , unsure Rybelsus but interested Currently on ACEi, ASA 81, statin Lifestyle: Weight remains down - Diet goal to improve diet still - Exercise: Limited due to arthritis and also more sedentary limited ability to go exercise now with COVID19 pandemic Admits had some polyuria prior, now seems to be reduced  [facility] - mild cataract no surgery needed, no DM retinopathy, she has tried to request copy Denies hypoglycemia, visual changes, numbness or tingling.  HYPERLIPIDEMIA / Familial Hyperlipidemia   CHRONIC HTN: Home BP elevated still. Higher in doctors office. Current Meds - Amlodipine '10mg'$  daily, Lisinopril '40mg'$  daily, HCTZ '25mg'$  daily Reports normally good compliance. Tolerating well, w/o complaints. Admits swelling of feet and hands if inc salt intake Denies CP, dyspnea, HA, dizziness / lightheadedness  Lumbar Radiculopathy Osteoarthritis hips Followed by Marvell Fuller now had recent consult She had nerve testing by Dr Krista Blue, no surgery needed. Seems improved, questions if needle for the NCS has had improvement. She is on Gabapentin '300mg'$  nightly, they asked her about taking 2. But 1 works well, 2 makes her too groggy Walking more, doing more house work  Home BP readings 126/70 and highest 136/75 Higher readings with anxiety at doctors office      07/03/2022   11:13 AM 02/14/2022   10:38 AM 12/28/2021    2:02 PM  Depression screen PHQ 2/9  Decreased Interest 0 0 0  Down, Depressed, Hopeless 0 0 1  PHQ - 2 Score 0 0 1  Altered sleeping  0 0  Tired, decreased energy '1 1  1  '$ Change in appetite 1 1 0  Feeling bad or failure about yourself  0 0 0  Trouble concentrating 0 0 0  Moving slowly or fidgety/restless 0 0 0  Suicidal thoughts 0 0 0  PHQ-9 Score  2 2  Difficult doing work/chores Not difficult at all Not difficult at all Not difficult at all    Social History   Tobacco Use   Smoking status: Former    Packs/day: 1.00    Years: 26.00    Total pack years: 26.00    Types: Cigarettes    Quit date: 06/07/1992    Years since quitting: 30.0   Smokeless tobacco: Former  Scientific laboratory technician Use: Never used  Substance Use Topics   Alcohol use: Not Currently    Alcohol/week: 0.0 standard drinks of alcohol    Comment: occassional   Drug use: No    Review of Systems Per HPI unless specifically indicated above     Objective:    BP 135/70 (BP Location: Left Arm, Cuff Size: Normal)   Pulse 80   Ht '5\' 7"'$  (1.702 m)   Wt 210 lb 9.6 oz (95.5 kg)   SpO2 99%   BMI 32.98 kg/m   Wt Readings from Last 3 Encounters:  07/03/22 210 lb 9.6 oz (95.5 kg)  06/19/22 209 lb (94.8 kg)  04/02/22 211 lb 8 oz (95.9 kg)    Physical Exam Vitals and nursing note reviewed.  Constitutional:  General: She is not in acute distress.    Appearance: Normal appearance. She is well-developed. She is not diaphoretic.     Comments: Well-appearing, comfortable, cooperative  HENT:     Head: Normocephalic and atraumatic.  Eyes:     General:        Right eye: No discharge.        Left eye: No discharge.     Conjunctiva/sclera: Conjunctivae normal.  Cardiovascular:     Rate and Rhythm: Normal rate.  Pulmonary:     Effort: Pulmonary effort is normal.  Skin:    General: Skin is warm and dry.     Findings: No erythema or rash.  Neurological:     Mental Status: She is alert and oriented to person, place, and time.  Psychiatric:        Mood and Affect: Mood normal.        Behavior: Behavior normal.        Thought Content: Thought content normal.     Comments: Well  groomed, good eye contact, normal speech and thoughts       Results for orders placed or performed in visit on 07/03/22  POCT HgB A1C  Result Value Ref Range   Hemoglobin A1C 6.3 (A) 4.0 - 5.6 %      Assessment & Plan:   Problem List Items Addressed This Visit     Anxiety disorder   Chronic right-sided low back pain with sciatica   Relevant Medications   naproxen (NAPROSYN) 500 MG tablet   Essential hypertension    Elevated BP then repeat manual improved, home readings even better Continue other meds Amlodipine 10 and HCTZ '25mg'$  daily, Irbesartan '300mg'$  daily Monitor BP Follow-up as planned      Spondylosis of lumbar region without myelopathy or radiculopathy   Relevant Medications   naproxen (NAPROSYN) 500 MG tablet   Type 2 diabetes mellitus with other specified complication (HCC) - Primary    Controlled DM, with A1c 6.3, controlled Concern with obesity, HTN, HLD  Plan:  1. Continue Metformin IR '500mg'$  BID 2. Encourage improved lifestyle - low carb, low sugar diet, reduce portion size, continue improving regular exercise  Discussion today on offer new rx Rybelsus '3mg'$  daily for 30 day trial, seems should be covered, offer this for better weight loss overall. She will notify me if ready to pursue, would likely reduce or stop metformin in future if doing well      Relevant Orders   POCT HgB A1C (Completed)    Lumbar radiculopathy / Leg Weakness / Hip OA/DJD Keep following with Neurology and Orthopedics Improving currently Reassuring results so far  Meds ordered this encounter  Medications   naproxen (NAPROSYN) 500 MG tablet    Sig: Take 1 tablet (500 mg total) by mouth 2 (two) times daily with a meal. For 1-2 weeks then as needed    Dispense:  60 tablet    Refill:  2      Follow up plan: Return in about 6 months (around 01/01/2023) for 6 month fasting lab only then 1 week later Annual Physical.  Future labs ordered for 12/25/22   Nobie Putnam,  [facility] 07/03/2022, 11:20 AM

## 2022-07-03 NOTE — Assessment & Plan Note (Addendum)
Controlled DM, with A1c 6.3, controlled Concern with obesity, HTN, HLD  Plan:  1. Continue Metformin IR '500mg'$  BID 2. Encourage improved lifestyle - low carb, low sugar diet, reduce portion size, continue improving regular exercise  Discussion today on offer new rx Rybelsus '3mg'$  daily for 30 day trial, seems should be covered, offer this for better weight loss overall. She will notify me if ready to pursue, would likely reduce or stop metformin in future if doing well

## 2022-07-16 ENCOUNTER — Ambulatory Visit: Payer: Medicare HMO | Admitting: Neurology

## 2022-09-06 ENCOUNTER — Other Ambulatory Visit: Payer: Self-pay | Admitting: Family Medicine

## 2022-09-06 DIAGNOSIS — I10 Essential (primary) hypertension: Secondary | ICD-10-CM

## 2022-09-06 NOTE — Telephone Encounter (Signed)
Requested medication (s) are due for refill today: expired medications  Requested medication (s) are on the active medication list: yes  Last refill:  12/07/20 #90 3 refills  Future visit scheduled: yes in 3 months  Notes to clinic:  expired medications do you want to renew Rxs?     Requested Prescriptions  Pending Prescriptions Disp Refills   amLODipine (NORVASC) 10 MG tablet [Pharmacy Med Name: AMLODIPINE BESYLATE 10 MG TAB] 90 tablet 0    Sig: Take 1 tablet (10 mg total) by mouth daily.     Cardiovascular: Calcium Channel Blockers 2 Passed - 09/06/2022  4:05 PM      Passed - Last BP in normal range    BP Readings from Last 1 Encounters:  07/03/22 135/70         Passed - Last Heart Rate in normal range    Pulse Readings from Last 1 Encounters:  07/03/22 80         Passed - Valid encounter within last 6 months    Recent Outpatient Visits           2 months ago Type 2 diabetes mellitus with other specified complication, without long-term current use of insulin ([place])   [place], DO   6 months ago [place], DO   8 months ago Annual physical exam   [place], DO   1 year ago Hyperlipidemia associated with type 2 diabetes mellitus [facility])   [facility] Olin Hauser, DO   1 year ago Acute non-recurrent frontal sinusitis   [place], DO       Future Appointments             In 3 months [place], Devonne Doughty, DO [facility]             irbesartan (AVAPRO) 300 MG tablet [Pharmacy Med Name: IRBESARTAN 300 MGTABLET] 90 tablet 0    Sig: Take 1 tablet (300 mg total) by mouth daily. May start with half tab daily for 1-2 weeks then increase to 1 whole tab daily     Cardiovascular:  Angiotensin Receptor Blockers Failed - 09/06/2022  4:05 PM       Failed - Cr in normal range and within 180 days    Creat  Date Value Ref Range Status  12/10/2021 0.60 0.60 - 1.00 mg/dL Final         Failed - K in normal range and within 180 days    Potassium  Date Value Ref Range Status  12/10/2021 4.5 3.5 - 5.3 mmol/L Final  11/20/2011 4.1 3.5 - 5.1 mmol/L Final         Passed - Patient is not pregnant      Passed - Last BP in normal range    BP Readings from Last 1 Encounters:  07/03/22 135/70         Passed - Valid encounter within last 6 months    Recent Outpatient Visits           2 months ago Type 2 diabetes mellitus with other specified complication, without long-term current use of insulin [facility])   [facility] Olin Hauser, DO   6 months ago [place], DO   8 months ago Annual physical exam   [place], DO   1 year ago Hyperlipidemia associated with type 2 diabetes mellitus [facility])   [facility] Olin Hauser, DO   1 year ago Acute non-recurrent frontal sinusitis   [place], DO       Future Appointments             In 3 months Parks Ranger, Devonne Doughty, DO [facility], [facility]

## 2022-09-17 ENCOUNTER — Other Ambulatory Visit: Payer: Self-pay | Admitting: Family Medicine

## 2022-09-17 DIAGNOSIS — I10 Essential (primary) hypertension: Secondary | ICD-10-CM

## 2022-09-17 DIAGNOSIS — E1169 Type 2 diabetes mellitus with other specified complication: Secondary | ICD-10-CM

## 2022-09-18 NOTE — Telephone Encounter (Signed)
Requested medication (s) are due for refill today:   Yes  Requested medication (s) are on the active medication list:   Yes  Future visit scheduled:   Yes   Last ordered: Both 12/07/2020 #90, 3 refills  Returned because labs are due per protocol.   Requested Prescriptions  Pending Prescriptions Disp Refills   metFORMIN (GLUCOPHAGE) 500 MG tablet [Pharmacy Med Name: METFORMIN HCL 500 MG TABLET] 180 tablet 0    Sig: Take 1 tablet (500 mg total) by mouth 2 (two) times daily with a meal.     Endocrinology:  Diabetes - Biguanides Failed - 09/17/2022 12:57 PM      Failed - B12 Level in normal range and within 720 days    Vitamin B-12  Date Value Ref Range Status  11/29/2019 >2,000 (H) 200 - 1,100 pg/mL Final         Passed - Cr in normal range and within 360 days    Creat  Date Value Ref Range Status  12/10/2021 0.60 0.60 - 1.00 mg/dL Final         Passed - HBA1C is between 0 and 7.9 and within 180 days    Hemoglobin A1C  Date Value Ref Range Status  07/03/2022 6.3 (A) 4.0 - 5.6 % Final  11/20/2011 6.5 (H) 4.2 - 6.3 % Final    Comment:    The American Diabetes Association recommends that a primary goal of therapy should be <7% and that physicians should reevaluate the treatment regimen in patients with HbA1c values consistently >8%.    Hgb A1c MFr Bld  Date Value Ref Range Status  12/10/2021 6.1 (H) <5.7 % of total Hgb Final    Comment:    For someone without known diabetes, a hemoglobin  A1c value between 5.7% and 6.4% is consistent with prediabetes and should be confirmed with a  follow-up test. . For someone with known diabetes, a value <7% indicates that their diabetes is well controlled. A1c targets should be individualized based on duration of diabetes, age, comorbid conditions, and other considerations. . This assay result is consistent with an increased risk of diabetes. . Currently, no consensus exists regarding use of hemoglobin A1c for diagnosis of  diabetes for children. .          Passed - eGFR in normal range and within 360 days    GFR, Est African American  Date Value Ref Range Status  04/11/2021 91 > OR = 60 mL/min/1.73m Final   GFR, Est Non African American  Date Value Ref Range Status  04/11/2021 78 > OR = 60 mL/min/1.72mFinal   eGFR  Date Value Ref Range Status  12/10/2021 92 > OR = 60 mL/min/1.7381minal    Comment:    The eGFR is based on the CKD-EPI 2021 equation. To calculate  the new eGFR from a previous Creatinine or Cystatin C result, go to https://www.kidney.org/professionals/ kdoqi/gfr%5Fcalculator          Passed - Valid encounter within last 6 months    Recent Outpatient Visits           2 months ago Type 2 diabetes mellitus with other specified complication, without long-term current use of [facility]   7 months ago [place]   9 months ago Annual physical exam   [place]   1 year ago Hyperlipidemia  associated with type 2 diabetes mellitus ([place])   [facility] [place], Devonne Doughty, DO   1 year ago Acute non-recurrent frontal sinusitis   [place], DO       Future Appointments             In 3 months Parks Ranger, Devonne Doughty, DO [facility], [facility]            Passed - CBC within normal limits and completed in the last 12 months    WBC  Date Value Ref Range Status  12/10/2021 3.9 3.8 - 10.8 Thousand/uL Final   RBC  Date Value Ref Range Status  12/10/2021 4.54 3.80 - 5.10 Million/uL Final   Hemoglobin  Date Value Ref Range Status  12/10/2021 13.5 11.7 - 15.5 g/dL Final   HGB  Date Value Ref Range Status  11/20/2011 13.6 12.0 - 16.0 g/dL Final   HCT  Date Value Ref Range Status  12/10/2021 41.0 35.0 - 45.0 % Final  11/20/2011 40.3 35.0 -  47.0 % Final   MCHC  Date Value Ref Range Status  12/10/2021 32.9 32.0 - 36.0 g/dL Final   [facility]  Date Value Ref Range Status  12/10/2021 29.7 27.0 - 33.0 pg Final   MCV  Date Value Ref Range Status  12/10/2021 90.3 80.0 - 100.0 fL Final  11/20/2011 90 80 - 100 fL Final   No results found for: "PLTCOUNTKUC", "LABPLAT", "POCPLA" RDW  Date Value Ref Range Status  12/10/2021 13.4 11.0 - 15.0 % Final  11/20/2011 14.4 11.5 - 14.5 % Final          hydrochlorothiazide (HYDRODIURIL) 25 MG tablet [Pharmacy Med Name: HYDROCHLOROTHIAZIDE 25 MG TAB] 90 tablet 0    Sig: Take 1 tablet (25 mg total) by mouth daily.     Cardiovascular: Diuretics - Thiazide Failed - 09/17/2022 12:57 PM      Failed - Cr in normal range and within 180 days    Creat  Date Value Ref Range Status  12/10/2021 0.60 0.60 - 1.00 mg/dL Final         Failed - K in normal range and within 180 days    Potassium  Date Value Ref Range Status  12/10/2021 4.5 3.5 - 5.3 mmol/L Final  11/20/2011 4.1 3.5 - 5.1 mmol/L Final         Failed - Na in normal range and within 180 days    Sodium  Date Value Ref Range Status  12/10/2021 141 135 - 146 mmol/L Final  11/20/2011 141 136 - 145 mmol/L Final         Passed - Last BP in normal range    BP Readings from Last 1 Encounters:  07/03/22 135/70         Passed - Valid encounter within last 6 months    Recent Outpatient Visits           2 months ago Type 2 diabetes mellitus with other specified complication, without long-term current use of insulin [facility])   [facility] Olin Hauser, DO   7 months ago [place], DO   9 months ago Annual physical exam   [place], Devonne Doughty, DO   1 year ago Hyperlipidemia associated with type 2 diabetes mellitus [facility])   [place], DO   1 year ago Acute non-recurrent  frontal sinusitis    [place], DO       Future Appointments             In 3 months [place], Devonne Doughty, DO [facility], [facility]

## 2022-11-13 ENCOUNTER — Other Ambulatory Visit: Payer: Self-pay | Admitting: Family Medicine

## 2022-11-13 DIAGNOSIS — K219 Gastro-esophageal reflux disease without esophagitis: Secondary | ICD-10-CM

## 2022-11-14 NOTE — Telephone Encounter (Signed)
Requested Prescriptions  Pending Prescriptions Disp Refills   pantoprazole (PROTONIX) 40 MG tablet [Pharmacy Med Name: PANTOPRAZOLE SOD DR 40 MG TAB] 90 tablet 0    Sig: Take 1 tablet (40 mg total) by mouth daily before breakfast.     Gastroenterology: Proton Pump Inhibitors Passed - 11/13/2022  6:24 PM      Passed - Valid encounter within last 12 months    Recent Outpatient Visits           4 months ago Type 2 diabetes mellitus with other specified complication, without long-term current use of insulin [facility])   [place], DO   9 months ago [place], DO   11 months ago Annual physical exam   [facility] Olin Hauser, DO   1 year ago Hyperlipidemia associated with type 2 diabetes mellitus [facility])   [place], DO   1 year ago Acute non-recurrent frontal sinusitis   [place], DO       Future Appointments             In 1 month Parks Ranger, Devonne Doughty, DO [facility], [facility]

## 2022-11-22 ENCOUNTER — Other Ambulatory Visit: Payer: Self-pay | Admitting: Family Medicine

## 2022-11-22 DIAGNOSIS — G8929 Other chronic pain: Secondary | ICD-10-CM

## 2022-11-22 DIAGNOSIS — M47816 Spondylosis without myelopathy or radiculopathy, lumbar region: Secondary | ICD-10-CM

## 2022-11-25 NOTE — Telephone Encounter (Signed)
Requested Prescriptions  Pending Prescriptions Disp Refills   naproxen (NAPROSYN) 500 MG tablet [Pharmacy Med Name: NAPROXEN500 MG TABLET] 60 tablet 0    Sig: Take 1 tablet (500 mg total) by mouth 2 (two) times daily with a meal. For 1-2 weeks then as needed     Analgesics:  NSAIDS Failed - 11/22/2022  2:25 PM      Failed - Manual Review: Labs are only required if the patient has taken medication for more than 8 weeks.      Passed - Cr in normal range and within 360 days    Creat  Date Value Ref Range Status  12/10/2021 0.60 0.60 - 1.00 mg/dL Final         Passed - HGB in normal range and within 360 days    Hemoglobin  Date Value Ref Range Status  12/10/2021 13.5 11.7 - 15.5 g/dL Final   HGB  Date Value Ref Range Status  11/20/2011 13.6 12.0 - 16.0 g/dL Final         Passed - PLT in normal range and within 360 days    Platelets  Date Value Ref Range Status  12/10/2021 202 140 - 400 Thousand/uL Final   Platelet  Date Value Ref Range Status  11/20/2011 191 150 - 440 x10 3/mm 3 Final         Passed - HCT in normal range and within 360 days    HCT  Date Value Ref Range Status  12/10/2021 41.0 35.0 - 45.0 % Final  11/20/2011 40.3 35.0 - 47.0 % Final         Passed - eGFR is 30 or above and within 360 days    GFR, Est African American  Date Value Ref Range Status  04/11/2021 91 > OR = 60 mL/min/1.38m Final   GFR, Est Non African American  Date Value Ref Range Status  04/11/2021 78 > OR = 60 mL/min/1.772mFinal   eGFR  Date Value Ref Range Status  12/10/2021 92 > OR = 60 mL/min/1.7370minal    Comment:    The eGFR is based on the CKD-EPI 2021 equation. To calculate  the new eGFR from a previous Creatinine or Cystatin C result, go to https://www.kidney.org/professionals/ kdoqi/gfr%5Fcalculator          Passed - Patient is not pregnant      Passed - Valid encounter within last 12 months    Recent Outpatient Visits           4 months ago Type 2 diabetes  mellitus with other specified complication, without long-term current use of insulin ([facility]   9 months ago [place]   11 months ago Annual physical exam   [facility]   1 year ago Hyperlipidemia associated with type 2 diabetes mellitus ([facility]   1 year ago Acute non-recurrent frontal sinusitis   [place]       Future Appointments             In 1 month KarParks RangerleDevonne DoughtyO [facility]

## 2022-12-24 ENCOUNTER — Other Ambulatory Visit: Payer: Self-pay

## 2022-12-24 DIAGNOSIS — I10 Essential (primary) hypertension: Secondary | ICD-10-CM

## 2022-12-24 DIAGNOSIS — Z0000 Encounter for general adult medical examination without abnormal findings: Secondary | ICD-10-CM

## 2022-12-24 DIAGNOSIS — E7849 Other hyperlipidemia: Secondary | ICD-10-CM

## 2022-12-24 DIAGNOSIS — E1169 Type 2 diabetes mellitus with other specified complication: Secondary | ICD-10-CM

## 2022-12-24 DIAGNOSIS — E785 Hyperlipidemia, unspecified: Secondary | ICD-10-CM

## 2022-12-25 ENCOUNTER — Other Ambulatory Visit: Payer: Medicare HMO

## 2022-12-25 DIAGNOSIS — I10 Essential (primary) hypertension: Secondary | ICD-10-CM | POA: Diagnosis not present

## 2022-12-25 DIAGNOSIS — Z0000 Encounter for general adult medical examination without abnormal findings: Secondary | ICD-10-CM | POA: Diagnosis not present

## 2022-12-25 DIAGNOSIS — E785 Hyperlipidemia, unspecified: Secondary | ICD-10-CM | POA: Diagnosis not present

## 2022-12-25 DIAGNOSIS — E1169 Type 2 diabetes mellitus with other specified complication: Secondary | ICD-10-CM | POA: Diagnosis not present

## 2022-12-25 DIAGNOSIS — E7849 Other hyperlipidemia: Secondary | ICD-10-CM | POA: Diagnosis not present

## 2022-12-26 LAB — LIPID PANEL
Cholesterol: 271 mg/dL — ABNORMAL HIGH (ref <200)
HDL: 51 mg/dL (ref >=50)
LDL Cholesterol (Calc): 170 mg/dL (calc) — ABNORMAL HIGH
Non-HDL Cholesterol (Calc): 220 mg/dL (calc) — ABNORMAL HIGH (ref <130)
Total CHOL/HDL Ratio: 5.3 (calc) — ABNORMAL HIGH (ref <5.0)
Triglycerides: 293 mg/dL — ABNORMAL HIGH (ref <150)

## 2022-12-26 LAB — CBC WITH DIFFERENTIAL/PLATELET
Absolute Monocytes: 386 cells/uL (ref 200–950)
Basophils Absolute: 21 cells/uL (ref 0–200)
Basophils Relative: 0.5 %
Eosinophils Absolute: 80 cells/uL (ref 15–500)
Eosinophils Relative: 1.9 %
HCT: 38.1 % (ref 35.0–45.0)
Hemoglobin: 12.6 g/dL (ref 11.7–15.5)
Lymphs Abs: 995 cells/uL (ref 850–3900)
MCH: 28.4 pg (ref 27.0–33.0)
MCHC: 33.1 g/dL (ref 32.0–36.0)
MCV: 86 fL (ref 80.0–100.0)
MPV: 11.1 fL (ref 7.5–12.5)
Monocytes Relative: 9.2 %
Neutro Abs: 2717 cells/uL (ref 1500–7800)
Neutrophils Relative %: 64.7 %
Platelets: 173 10*3/uL (ref 140–400)
RBC: 4.43 10*6/uL (ref 3.80–5.10)
RDW: 13.9 % (ref 11.0–15.0)
Total Lymphocyte: 23.7 %
WBC: 4.2 10*3/uL (ref 3.8–10.8)

## 2022-12-26 LAB — COMPLETE METABOLIC PANEL WITH GFR
AG Ratio: 1.7 (calc) (ref 1.0–2.5)
ALT: 11 U/L (ref 6–29)
AST: 13 U/L (ref 10–35)
Albumin: 4.1 g/dL (ref 3.6–5.1)
Alkaline phosphatase (APISO): 91 U/L (ref 37–153)
BUN/Creatinine Ratio: 24 (calc) — ABNORMAL HIGH (ref 6–22)
BUN: 14 mg/dL (ref 7–25)
CO2: 26 mmol/L (ref 20–32)
Calcium: 9.2 mg/dL (ref 8.6–10.4)
Chloride: 105 mmol/L (ref 98–110)
Creat: 0.58 mg/dL — ABNORMAL LOW (ref 0.60–1.00)
Globulin: 2.4 g/dL (calc) (ref 1.9–3.7)
Glucose, Bld: 131 mg/dL — ABNORMAL HIGH (ref 65–99)
Potassium: 4.8 mmol/L (ref 3.5–5.3)
Sodium: 142 mmol/L (ref 135–146)
Total Bilirubin: 0.5 mg/dL (ref 0.2–1.2)
Total Protein: 6.5 g/dL (ref 6.1–8.1)
eGFR: 93 mL/min/{1.73_m2} (ref >=60)

## 2022-12-26 LAB — HEMOGLOBIN A1C
Hgb A1c MFr Bld: 6.7 % of total Hgb — ABNORMAL HIGH (ref <5.7)
Mean Plasma Glucose: 146 mg/dL
eAG (mmol/L): 8.1 mmol/L

## 2022-12-26 LAB — TSH: TSH: 3.15 mIU/L (ref 0.40–4.50)

## 2023-01-01 ENCOUNTER — Encounter: Payer: Self-pay | Admitting: Family Medicine

## 2023-01-01 ENCOUNTER — Ambulatory Visit (INDEPENDENT_AMBULATORY_CARE_PROVIDER_SITE_OTHER): Payer: Medicare HMO | Admitting: Family Medicine

## 2023-01-01 VITALS — BP 138/74 | HR 91 | Ht 67.0 in | Wt 208.0 lb

## 2023-01-01 DIAGNOSIS — E785 Hyperlipidemia, unspecified: Secondary | ICD-10-CM | POA: Diagnosis not present

## 2023-01-01 DIAGNOSIS — G8929 Other chronic pain: Secondary | ICD-10-CM

## 2023-01-01 DIAGNOSIS — E1169 Type 2 diabetes mellitus with other specified complication: Secondary | ICD-10-CM | POA: Diagnosis not present

## 2023-01-01 DIAGNOSIS — I10 Essential (primary) hypertension: Secondary | ICD-10-CM | POA: Diagnosis not present

## 2023-01-01 DIAGNOSIS — M5441 Lumbago with sciatica, right side: Secondary | ICD-10-CM | POA: Diagnosis not present

## 2023-01-01 DIAGNOSIS — F411 Generalized anxiety disorder: Secondary | ICD-10-CM

## 2023-01-01 DIAGNOSIS — Z0000 Encounter for general adult medical examination without abnormal findings: Secondary | ICD-10-CM | POA: Diagnosis not present

## 2023-01-01 DIAGNOSIS — M47816 Spondylosis without myelopathy or radiculopathy, lumbar region: Secondary | ICD-10-CM | POA: Diagnosis not present

## 2023-01-01 DIAGNOSIS — E7849 Other hyperlipidemia: Secondary | ICD-10-CM | POA: Diagnosis not present

## 2023-01-01 DIAGNOSIS — G720 Drug-induced myopathy: Secondary | ICD-10-CM | POA: Diagnosis not present

## 2023-01-01 MED ORDER — IRBESARTAN 300 MG PO TABS: 300.0000 mg | ORAL_TABLET | Freq: Every day | ORAL | 3 refills | Status: DC

## 2023-01-01 MED ORDER — HYDROCHLOROTHIAZIDE 25 MG PO TABS: 25.0000 mg | ORAL_TABLET | Freq: Every day | ORAL | 3 refills | Status: DC

## 2023-01-01 MED ORDER — NAPROXEN 500 MG PO TABS: 500.0000 mg | ORAL_TABLET | Freq: Two times a day (BID) | ORAL | 2 refills | Status: DC

## 2023-01-01 MED ORDER — AMLODIPINE BESYLATE 10 MG PO TABS: 10.0000 mg | ORAL_TABLET | Freq: Every day | ORAL | 3 refills | Status: DC

## 2023-01-01 NOTE — Assessment & Plan Note (Signed)
Controlled.  

## 2023-01-01 NOTE — Patient Instructions (Addendum)
Thank you for coming to the office today.  Recent Labs    07/03/22 1130 12/25/22 0803  HGBA1C 6.3* 6.7*   Keep on current therapy with Metformin sporadic dosing as you are seems to be working well.  We discussed in future if cholesterol is significantly higher we can reconsider the injection therapy again if eligible  Discontinue Lisinopril (duplicate)  Refilled BP meds - Take Amlodipine 10mg  daily, Irbesartan (Avapro) 300mg  daily, and Hydrochlorothiazide HTZ 25mg  daily.    Please schedule a Follow-up Appointment to: Return in about 6 months (around 07/04/2023) for 6 month DM A1c, HLD, DJD updates.  If you have any other questions or concerns, please feel free to call the office or send a message through [place]. You may also schedule an earlier appointment if necessary.  Additionally, you may be receiving a survey about your experience at our office within a few days to 1 week by e-mail or mail. We value your feedback.  Nobie Putnam, DO [place]

## 2023-01-01 NOTE — Assessment & Plan Note (Signed)
Controlled DM, with A1c 6.7 still in range controlled Concern with obesity, HTN, HLD  Plan:  1. Continue Metformin IR 500mg  BID 2. Encourage improved lifestyle - low carb, low sugar diet, reduce portion size, continue improving regular exercise

## 2023-01-01 NOTE — Assessment & Plan Note (Signed)
Elevated LDL 170s off therapy Previously Dramatic improved controlled on Repatha PCSK9 Failed Statin therapy Simvastatin, Atorvastatin, Rosuvastatin, intermittent dosing and failed Zetia  The 10-year ASCVD risk score (Arnett DK, et al., 2019) is: 52.1%  Plan: 1. Remain off therapy - we discussed importance in future if ready to resume therapy would reconsider PCSK9 2. Encourage improved lifestyle - low carb/cholesterol, reduce portion size, continue improving regular exercise

## 2023-01-01 NOTE — Assessment & Plan Note (Signed)
Resolved off therapy Secondary to statin muscle ache myalgia Failed Zetia, Simvastatin, Rosuvastatin, Atorvastatin and intermittent low dosing

## 2023-01-01 NOTE — Assessment & Plan Note (Signed)
Elevated BP then repeat manual improved, home readings even better  Discontinue Lisinopril (duplicate) - she was taking old rx Refilled BP meds - Take Amlodipine 10mg  daily, Irbesartan (Avapro) 300mg  daily, and Hydrochlorothiazide HTZ 25mg  daily.  Monitor BP Follow-up as planned

## 2023-01-01 NOTE — Assessment & Plan Note (Signed)
See A&P HLD

## 2023-01-01 NOTE — Progress Notes (Signed)
Subjective:    Patient ID: Yvette [name], female    DOB: 01-Aug-1946, 77 y.o.   MRN: [id number]  Yvette [name] is a 77 y.o. female presenting on 01/01/2023 for Annual Exam   HPI  Here for Annual Physical and Lab Review  Type 2 Diabetes / Hyperglycemia / Morbid Obesity BMI >32 She has done well overall.  A1c 6.7 elevated mild from prior 6.3 Meds: Metformin 500 TWICE A DAY but may skip dose for period of time. Currently on ACEi, ASA 81, statin Lifestyle: Weight remains down - Diet goal to improve diet still - Exercise: Limited due to arthritis and also more sedentary limited ability to go exercise now with COVID19 pandemic Admits had some polyuria prior, now seems to be reduced  [facility] - mild cataract no surgery needed, no DM retinopathy, last done 01/2022 will be due in 01/2023 Denies hypoglycemia, visual changes, numbness or tingling.   HYPERLIPIDEMIA / Familial Hyperlipidemia Last lipid panel inc LDL to 170, prior 165, and TG 290s, has improved. Overall similar to last time. Has failed Statin therapy, Zetia, Rosuvastatin. Also tried PCSK9 inhibitor therapy Repatha She has famlilal cholesterol history and hyperTG Now off of medication She is trying to help manage and control this on her own with diet   CHRONIC HTN: Home BP elevated still. Higher in doctors office. Home BP readings 120-130s / 70-80s Higher readings with anxiety at doctors office Current Meds - Amlodipine 10mg  daily, Irbesartan 300mg  daily, HCTZ 25mg  daily Reports normally good compliance. Tolerating well, w/o complaints. Admits swelling of feet and hands if inc salt intake Denies CP, dyspnea, HA, dizziness / lightheadedness   Lumbar Radiculopathy Osteoarthritis hips Followed by Ortho and Neurology She had nerve testing by Dr Krista Blue, no surgery needed. Dx DDD spine  Unlikely to have spine surgery in future She is improving her ambulation and her nerve strength and function  Vitamin B12 Last  lab >2000 elevated, she has been taking B12 supplement every other day Vitamin D lab shows improved >30 now  Health Maintenance: Updated      01/01/2023   11:10 AM 07/03/2022   11:13 AM 02/14/2022   10:38 AM  Depression screen PHQ 2/9  Decreased Interest 0 0 0  Down, Depressed, Hopeless 0 0 0  PHQ - 2 Score 0 0 0  Altered sleeping 0  0  Tired, decreased energy 1 1 1   Change in appetite 1 1 1   Feeling bad or failure about yourself  0 0 0  Trouble concentrating 0 0 0  Moving slowly or fidgety/restless 0 0 0  Suicidal thoughts 0 0 0  PHQ-9 Score 2  2  Difficult doing work/chores Not difficult at all Not difficult at all Not difficult at all    Past Medical History:  Diagnosis Date   Arthropathy, unspecified, site unspecified    Esophageal reflux    Osteoarthritis    Osteoporosis, unspecified    Other abnormal glucose    Other and unspecified hyperlipidemia    Symptomatic menopausal or female climacteric states    Syncope and collapse    Past Surgical History:  Procedure Laterality Date   ABDOMINAL HYSTERECTOMY     APPENDECTOMY     BREAST BIOPSY Left 1970's   neg   COLONOSCOPY     CYST REMOVAL TRUNK  11/24/2020   from back   FINGER FRACTURE SURGERY     Social History   Socioeconomic History   Marital status: Widowed  Spouse name: Not on file   Number of children: Not on file   Years of education: Not on file   Highest education level: Not on file  Occupational History   Occupation: retired  Tobacco Use   Smoking status: Former    Packs/day: 1.00    Years: 26.00    Additional pack years: 0.00    Total pack years: 26.00    Types: Cigarettes    Quit date: 06/07/1992    Years since quitting: 30.5   Smokeless tobacco: Former  Scientific laboratory technician Use: Never used  Substance and Sexual Activity   Alcohol use: Not Currently    Alcohol/week: 0.0 standard drinks of alcohol    Comment: occassional   Drug use: No   Sexual activity: Not on file  Other Topics  Concern   Not on file  Social History Narrative   Not on file   Social Determinants of Health   Financial Resource Strain: Low Risk  (12/28/2021)   Overall Financial Resource Strain (CARDIA)    Difficulty of Paying Living Expenses: Not hard at all  Food Insecurity: No Food Insecurity (12/28/2021)   Hunger Vital Sign    Worried About Running Out of Food in the Last Year: Never true    [place] in the Last Year: Never true  Transportation Needs: No Transportation Needs (12/28/2021)   PRAPARE - Hydrologist (Medical): No    Lack of Transportation (Non-Medical): No  Physical Activity: Insufficiently Active (12/28/2021)   Exercise Vital Sign    Days of Exercise per Week: 7 days    Minutes of Exercise per Session: 20 min  Stress: No Stress Concern Present (12/28/2021)   [place]    Feeling of Stress : Not at all  Social Connections: Socially Isolated (12/28/2021)   Social Connection and Isolation Panel [NHANES]    Frequency of Communication with Friends and Family: Once a week    Frequency of Social Gatherings with Friends and Family: Once a week    Attends Religious Services: Never    Marine scientist or Organizations: No    Attends Archivist Meetings: Never    Marital Status: Widowed  Intimate Partner Violence: Not At Risk (12/28/2021)   Humiliation, Afraid, Rape, and Kick questionnaire    Fear of Current or Ex-Partner: No    Emotionally Abused: No    Physically Abused: No    Sexually Abused: No   Family History  Problem Relation Age of Onset   Diabetes Mother    Heart disease Mother    Heart attack Mother    Ulcers Father    Gallstones Father    Heart attack Brother    Current Outpatient Medications on File Prior to Visit  Medication Sig   Acetaminophen (TYLENOL 8 HOUR ARTHRITIS PAIN PO) Take by mouth. 2 in am 1 in pm   albuterol (VENTOLIN HFA) 108 (90  Base) MCG/ACT inhaler Inhale 2 puffs into the lungs every 4 (four) hours as needed for wheezing or shortness of breath (cough).   ascorbic acid (VITAMIN C) 500 MG tablet Take by mouth.   aspirin 81 MG tablet Take 81 mg by mouth daily.   beta carotene w/minerals (OCUVITE) tablet Take 1 tablet by mouth daily.   Calcium-Phosphorus-Vitamin D (CITRACAL +D3 PO) Take 1 Dose by mouth 2 (two) times daily.   Cetirizine HCl 10 MG CAPS Take by mouth.  Cholecalciferol 10 MCG (400 UNIT) CAPS Take 1 tablet by mouth daily with breakfast.   Co-Enzyme Q-10 100 MG CAPS Take 1 capsule by mouth daily.   fluticasone (FLONASE) 50 MCG/ACT nasal spray Place 2 sprays into both nostrils daily.   furosemide (LASIX) 20 MG tablet Take 1-2 tablets (20-40 mg total) by mouth daily as needed for fluid or edema. Recommend use up to 3-5 days at a time if needed.   gabapentin (NEURONTIN) 300 MG capsule Take 2 capsules (600 mg total) by mouth at bedtime as needed. (Patient taking differently: Take 300 mg by mouth 1 day or 1 dose.)   ipratropium (ATROVENT) 0.06 % nasal spray Place 2 sprays into both nostrils 4 (four) times daily. For up to 5-7 days then stop.   Krill Oil 300 MG CAPS Take by mouth daily.   loratadine (CLARITIN) 10 MG tablet Take 10 mg by mouth daily.   Mag Aspart-Potassium Aspart (POTASSIUM & MAGNESIUM ASPARTAT PO) Take 1 tablet by mouth daily.   metFORMIN (GLUCOPHAGE) 500 MG tablet Take 1 tablet (500 mg total) by mouth 2 (two) times daily with a meal.   Multiple Vitamin (MULTIVITAMIN) tablet Take 1 tablet by mouth daily.   pantoprazole (PROTONIX) 40 MG tablet Take 1 tablet (40 mg total) by mouth daily before breakfast.   No current facility-administered medications on file prior to visit.    Review of Systems  Constitutional:  Negative for activity change, appetite change, chills, diaphoresis, fatigue and fever.  HENT:  Negative for congestion and hearing loss.   Eyes:  Negative for visual disturbance.   Respiratory:  Negative for cough, chest tightness, shortness of breath and wheezing.   Cardiovascular:  Negative for chest pain, palpitations and leg swelling.  Gastrointestinal:  Negative for abdominal pain, constipation, diarrhea, nausea and vomiting.  Genitourinary:  Negative for dysuria, frequency and hematuria.  Musculoskeletal:  Positive for arthralgias and back pain. Negative for neck pain.  Skin:  Negative for rash.  Neurological:  Negative for dizziness, weakness, light-headedness, numbness and headaches.  Hematological:  Negative for adenopathy.  Psychiatric/Behavioral:  Negative for behavioral problems, dysphoric mood and sleep disturbance.    Per HPI unless specifically indicated above      Objective:    BP 138/74 (BP Location: Left Arm, Cuff Size: Normal)   Pulse 91   Ht 5\' 7"  (1.702 m)   Wt 208 lb (94.3 kg)   SpO2 99%   BMI 32.58 kg/m   Wt Readings from Last 3 Encounters:  01/01/23 208 lb (94.3 kg)  07/03/22 210 lb 9.6 oz (95.5 kg)  06/19/22 209 lb (94.8 kg)    Physical Exam Vitals and nursing note reviewed.  Constitutional:      General: She is not in acute distress.    Appearance: She is well-developed. She is obese. She is not diaphoretic.     Comments: Well-appearing, comfortable, cooperative  HENT:     Head: Normocephalic and atraumatic.  Eyes:     General:        Right eye: No discharge.        Left eye: No discharge.     Conjunctiva/sclera: Conjunctivae normal.     Pupils: Pupils are equal, round, and reactive to light.  Neck:     Thyroid: No thyromegaly.     Vascular: No carotid bruit.  Cardiovascular:     Rate and Rhythm: Normal rate and regular rhythm.     Pulses: Normal pulses.     Heart sounds: Normal heart  sounds. No murmur heard. Pulmonary:     Effort: Pulmonary effort is normal. No respiratory distress.     Breath sounds: Normal breath sounds. No wheezing or rales.  Abdominal:     General: Bowel sounds are normal. There is no  distension.     Palpations: Abdomen is soft. There is no mass.     Tenderness: There is no abdominal tenderness.  Musculoskeletal:        General: No tenderness. Normal range of motion.     Cervical back: Normal range of motion and neck supple.     Right lower leg: No edema.     Left lower leg: No edema.     Comments: Upper / Lower Extremities: - Normal muscle tone, strength bilateral upper extremities 5/5, lower extremities 5/5  Lymphadenopathy:     Cervical: No cervical adenopathy.  Skin:    General: Skin is warm and dry.     Findings: No erythema or rash.  Neurological:     Mental Status: She is alert and oriented to person, place, and time.     Comments: Distal sensation intact to light touch all extremities  Psychiatric:        Mood and Affect: Mood normal.        Behavior: Behavior normal.        Thought Content: Thought content normal.     Comments: Well groomed, good eye contact, normal speech and thoughts      Results for orders placed or performed in visit on 12/24/22  TSH  Result Value Ref Range   TSH 3.15 0.40 - 4.50 mIU/L  Hemoglobin A1c  Result Value Ref Range   Hgb A1c MFr Bld 6.7 (H) <5.7 % of total Hgb   Mean Plasma Glucose 146 mg/dL   eAG (mmol/L) 8.1 mmol/L  Lipid panel  Result Value Ref Range   Cholesterol 271 (H) <200 mg/dL   HDL 51 > OR = 50 mg/dL   Triglycerides 293 (H) <150 mg/dL   LDL Cholesterol (Calc) 170 (H) mg/dL (calc)   Total CHOL/HDL Ratio 5.3 (H) <5.0 (calc)   Non-HDL Cholesterol (Calc) 220 (H) <130 mg/dL (calc)  COMPLETE METABOLIC PANEL WITH GFR  Result Value Ref Range   Glucose, Bld 131 (H) 65 - 99 mg/dL   BUN 14 7 - 25 mg/dL   Creat 0.58 (L) 0.60 - 1.00 mg/dL   eGFR 93 > OR = 60 mL/min/1.21m2   BUN/Creatinine Ratio 24 (H) 6 - 22 (calc)   Sodium 142 135 - 146 mmol/L   Potassium 4.8 3.5 - 5.3 mmol/L   Chloride 105 98 - 110 mmol/L   CO2 26 20 - 32 mmol/L   Calcium 9.2 8.6 - 10.4 mg/dL   Total Protein 6.5 6.1 - 8.1 g/dL    Albumin 4.1 3.6 - 5.1 g/dL   Globulin 2.4 1.9 - 3.7 g/dL (calc)   AG Ratio 1.7 1.0 - 2.5 (calc)   Total Bilirubin 0.5 0.2 - 1.2 mg/dL   Alkaline phosphatase (APISO) 91 37 - 153 U/L   AST 13 10 - 35 U/L   ALT 11 6 - 29 U/L  CBC with Differential/Platelet  Result Value Ref Range   WBC 4.2 3.8 - 10.8 Thousand/uL   RBC 4.43 3.80 - 5.10 Million/uL   Hemoglobin 12.6 11.7 - 15.5 g/dL   HCT 38.1 35.0 - 45.0 %   MCV 86.0 80.0 - 100.0 fL   MCH 28.4 27.0 - 33.0 pg   MCHC 33.1 32.0 -  36.0 g/dL   RDW 13.9 11.0 - 15.0 %   Platelets 173 140 - 400 Thousand/uL   MPV 11.1 7.5 - 12.5 fL   Neutro Abs 2,717 1,500 - 7,800 cells/uL   Lymphs Abs 995 850 - 3,900 cells/uL   Absolute Monocytes 386 200 - 950 cells/uL   Eosinophils Absolute 80 15 - 500 cells/uL   Basophils Absolute 21 0 - 200 cells/uL   Neutrophils Relative % 64.7 %   Total Lymphocyte 23.7 %   Monocytes Relative 9.2 %   Eosinophils Relative 1.9 %   Basophils Relative 0.5 %      Assessment & Plan:   Problem List Items Addressed This Visit     Anxiety disorder    Controlled       Chronic right-sided low back pain with sciatica   Relevant Medications   naproxen (NAPROSYN) 500 MG tablet   Drug-induced myopathy    Resolved off therapy Secondary to statin muscle ache myalgia Failed Zetia, Simvastatin, Rosuvastatin, Atorvastatin and intermittent low dosing      Essential hypertension    Elevated BP then repeat manual improved, home readings even better  Discontinue Lisinopril (duplicate) - she was taking old rx Refilled BP meds - Take Amlodipine 10mg  daily, Irbesartan (Avapro) 300mg  daily, and Hydrochlorothiazide HTZ 25mg  daily.  Monitor BP Follow-up as planned      Relevant Medications   amLODipine (NORVASC) 10 MG tablet   irbesartan (AVAPRO) 300 MG tablet   hydrochlorothiazide (HYDRODIURIL) 25 MG tablet   Familial hyperlipidemia    See A&P HLD      Relevant Medications   amLODipine (NORVASC) 10 MG tablet    irbesartan (AVAPRO) 300 MG tablet   hydrochlorothiazide (HYDRODIURIL) 25 MG tablet   Hyperlipidemia associated with type 2 diabetes mellitus (HCC)    Elevated LDL 170s off therapy Previously Dramatic improved controlled on Repatha PCSK9 Failed Statin therapy Simvastatin, Atorvastatin, Rosuvastatin, intermittent dosing and failed Zetia  The 10-year ASCVD risk score (Arnett DK, et al., 2019) is: 52.1%  Plan: 1. Remain off therapy - we discussed importance in future if ready to resume therapy would reconsider PCSK9 2. Encourage improved lifestyle - low carb/cholesterol, reduce portion size, continue improving regular exercise      Relevant Medications   amLODipine (NORVASC) 10 MG tablet   irbesartan (AVAPRO) 300 MG tablet   hydrochlorothiazide (HYDRODIURIL) 25 MG tablet   Spondylosis of lumbar region without myelopathy or radiculopathy   Relevant Medications   naproxen (NAPROSYN) 500 MG tablet   Type 2 diabetes mellitus with other specified complication (HCC)    Controlled DM, with A1c 6.7 still in range controlled Concern with obesity, HTN, HLD  Plan:  1. Continue Metformin IR 500mg  BID 2. Encourage improved lifestyle - low carb, low sugar diet, reduce portion size, continue improving regular exercise      Relevant Medications   irbesartan (AVAPRO) 300 MG tablet   Other Visit Diagnoses     Annual physical exam    -  Primary       Updated Health Maintenance information Reviewed recent lab results with patient Encouraged improvement to lifestyle with diet and exercise Goal of weight loss  Keep on current therapy with Metformin sporadic dosing as you are seems to be working well.  We discussed in future if cholesterol is significantly higher we can reconsider the injection therapy again if eligible   Meds ordered this encounter  Medications   amLODipine (NORVASC) 10 MG tablet    Sig: Take 1  tablet (10 mg total) by mouth daily.    Dispense:  90 tablet    Refill:  3    irbesartan (AVAPRO) 300 MG tablet    Sig: Take 1 tablet (300 mg total) by mouth daily.    Dispense:  90 tablet    Refill:  3   naproxen (NAPROSYN) 500 MG tablet    Sig: Take 1 tablet (500 mg total) by mouth 2 (two) times daily with a meal. For 1-2 weeks then as needed    Dispense:  60 tablet    Refill:  2   hydrochlorothiazide (HYDRODIURIL) 25 MG tablet    Sig: Take 1 tablet (25 mg total) by mouth daily.    Dispense:  90 tablet    Refill:  3    Follow up plan: Return in about 6 months (around 07/04/2023) for 6 month DM A1c, HLD, DJD updates.   Nobie Putnam, [facility] 01/01/2023, 11:26 AM

## 2023-01-17 ENCOUNTER — Ambulatory Visit (INDEPENDENT_AMBULATORY_CARE_PROVIDER_SITE_OTHER): Payer: Medicare HMO

## 2023-01-17 VITALS — Ht 67.0 in | Wt 208.0 lb

## 2023-01-17 DIAGNOSIS — Z0000 Encounter for general adult medical examination without abnormal findings: Secondary | ICD-10-CM

## 2023-01-17 DIAGNOSIS — Z780 Asymptomatic menopausal state: Secondary | ICD-10-CM | POA: Diagnosis not present

## 2023-01-17 NOTE — Progress Notes (Signed)
I connected with  Yvette [name] on 01/17/23 by a audio enabled telemedicine application and verified that I am speaking with the correct person using two identifiers.  Patient Location: Home  Provider Location: Office/Clinic  I discussed the limitations of evaluation and management by telemedicine. The patient expressed understanding and agreed to proceed.  Subjective:   Yvette [name] is a 77 y.o. female who presents for Medicare Annual (Subsequent) preventive examination.  Review of Systems     Cardiac Risk Factors include: advanced age (>77men, >58 women);diabetes mellitus;dyslipidemia;hypertension;obesity (BMI >30kg/m2)     Objective:    There were no vitals filed for this visit. There is no height or weight on file to calculate BMI.     01/17/2023    8:23 AM 12/28/2021    2:11 PM 12/26/2020    9:08 AM 11/30/2019    3:19 PM 06/06/2015    1:12 PM 06/06/2015    8:31 AM  Advanced Directives  Does Patient Have a Medical Advance Directive? No No Yes Yes Yes No;Yes  Type of Best boy of [place];Living will Living will;Healthcare Power of Attorney Living will Living will  Does patient want to make changes to medical advance directive?     No - Patient declined   Copy of Healthcare Power of Attorney in Chart?   No - copy requested No - copy requested No - copy requested No - copy requested  Would patient like information on creating a medical advance directive? No - Patient declined No - Patient declined        Current Medications (verified) Outpatient Encounter Medications as of 01/17/2023  Medication Sig   Acetaminophen (TYLENOL 8 HOUR ARTHRITIS PAIN PO) Take by mouth. 2 in am 1 in pm   albuterol (VENTOLIN HFA) 108 (90 Base) MCG/ACT inhaler Inhale 2 puffs into the lungs every 4 (four) hours as needed for wheezing or shortness of breath (cough).   amLODipine (NORVASC) 10 MG tablet Take 1 tablet (10 mg total) by mouth daily.   ascorbic acid (VITAMIN C)  500 MG tablet Take by mouth.   aspirin 81 MG tablet Take 81 mg by mouth daily.   beta carotene w/minerals (OCUVITE) tablet Take 1 tablet by mouth daily.   Calcium-Phosphorus-Vitamin D (CITRACAL +D3 PO) Take 1 Dose by mouth 2 (two) times daily.   Cetirizine HCl 10 MG CAPS Take by mouth.   Cholecalciferol 10 MCG (400 UNIT) CAPS Take 1 tablet by mouth daily with breakfast.   Co-Enzyme Q-10 100 MG CAPS Take 1 capsule by mouth daily.   fluticasone (FLONASE) 50 MCG/ACT nasal spray Place 2 sprays into both nostrils daily.   furosemide (LASIX) 20 MG tablet Take 1-2 tablets (20-40 mg total) by mouth daily as needed for fluid or edema. Recommend use up to 3-5 days at a time if needed.   gabapentin (NEURONTIN) 300 MG capsule Take 2 capsules (600 mg total) by mouth at bedtime as needed. (Patient taking differently: Take 300 mg by mouth 1 day or 1 dose.)   hydrochlorothiazide (HYDRODIURIL) 25 MG tablet Take 1 tablet (25 mg total) by mouth daily.   ipratropium (ATROVENT) 0.06 % nasal spray Place 2 sprays into both nostrils 4 (four) times daily. For up to 5-7 days then stop.   irbesartan (AVAPRO) 300 MG tablet Take 1 tablet (300 mg total) by mouth daily.   Krill Oil 300 MG CAPS Take by mouth daily.   loratadine (CLARITIN) 10 MG tablet Take 10 mg by  mouth daily.   Mag Aspart-Potassium Aspart (POTASSIUM & MAGNESIUM ASPARTAT PO) Take 1 tablet by mouth daily.   Multiple Vitamin (MULTIVITAMIN) tablet Take 1 tablet by mouth daily.   naproxen (NAPROSYN) 500 MG tablet Take 1 tablet (500 mg total) by mouth 2 (two) times daily with a meal. For 1-2 weeks then as needed   pantoprazole (PROTONIX) 40 MG tablet Take 1 tablet (40 mg total) by mouth daily before breakfast.   metFORMIN (GLUCOPHAGE) 500 MG tablet Take 1 tablet (500 mg total) by mouth 2 (two) times daily with a meal. (Patient not taking: Reported on 01/17/2023)   No facility-administered encounter medications on file as of 01/17/2023.    Allergies  (verified) Simvastatin and Erythromycin   History: Past Medical History:  Diagnosis Date   Arthropathy, unspecified, site unspecified    Esophageal reflux    Osteoarthritis    Osteoporosis, unspecified    Other abnormal glucose    Other and unspecified hyperlipidemia    Symptomatic menopausal or female climacteric states    Syncope and collapse    Past Surgical History:  Procedure Laterality Date   ABDOMINAL HYSTERECTOMY     APPENDECTOMY     BREAST BIOPSY Left 1970's   neg   COLONOSCOPY     CYST REMOVAL TRUNK  11/24/2020   from back   FINGER FRACTURE SURGERY     Family History  Problem Relation Age of Onset   Diabetes Mother    Heart disease Mother    Heart attack Mother    Ulcers Father    Gallstones Father    Heart attack Brother    Social History   Socioeconomic History   Marital status: Widowed    Spouse name: Not on file   Number of children: Not on file   Years of education: Not on file   Highest education level: Not on file  Occupational History   Occupation: retired  Tobacco Use   Smoking status: Former    Packs/day: 1.00    Years: 26.00    Additional pack years: 0.00    Total pack years: 26.00    Types: Cigarettes    Quit date: 06/07/1992    Years since quitting: 30.6   Smokeless tobacco: Former  Building services engineer Use: Never used  Substance and Sexual Activity   Alcohol use: Not Currently    Alcohol/week: 0.0 standard drinks of alcohol    Comment: occassional   Drug use: No   Sexual activity: Not on file  Other Topics Concern   Not on file  Social History Narrative   Not on file   Social Determinants of Health   Financial Resource Strain: Low Risk  (01/17/2023)   Overall Financial Resource Strain (CARDIA)    Difficulty of Paying Living Expenses: Not hard at all  Food Insecurity: No Food Insecurity (01/17/2023)   Hunger Vital Sign    Worried About Running Out of Food in the Last Year: Never true    Ran Out of Food in the Last Year:  Never true  Transportation Needs: No Transportation Needs (01/17/2023)   PRAPARE - Administrator, Civil Service (Medical): No    Lack of Transportation (Non-Medical): No  Physical Activity: Insufficiently Active (01/17/2023)   Exercise Vital Sign    Days of Exercise per Week: 3 days    Minutes of Exercise per Session: 30 min  Stress: No Stress Concern Present (01/17/2023)   [employer] of Occupational Health - Occupational Stress Questionnaire  Feeling of Stress : Not at all  Social Connections: Moderately Isolated (01/17/2023)   Social Connection and Isolation Panel [NHANES]    Frequency of Communication with Friends and Family: More than three times a week    Frequency of Social Gatherings with Friends and Family: More than three times a week    Attends Religious Services: More than 4 times per year    Active Member of [employer] or Organizations: No    Attends Banker Meetings: Never    Marital Status: Widowed    Tobacco Counseling Counseling given: Not Answered   Clinical Intake:  Pre-visit preparation completed: Yes  Pain : No/denies pain     Nutritional Risks: None Diabetes: Yes CBG done?: No Did pt. bring in CBG monitor from home?: No  How often do you need to have someone help you when you read instructions, pamphlets, or other written materials from your doctor or pharmacy?: 1 - Never  Diabetic?yes Nutrition Risk Assessment:  Has the patient had any N/V/D within the last 2 months?  No  Does the patient have any non-healing wounds?  No  Has the patient had any unintentional weight loss or weight gain?  No   Diabetes:  Is the patient diabetic?  Yes  If diabetic, was a CBG obtained today?  No  Did the patient bring in their glucometer from home?  No  How often do you monitor your CBG's? 3-4x/week.   Financial Strains and Diabetes Management:  Are you having any financial strains with the device, your supplies or your medication?  No .  Does the patient want to be seen by Chronic Care Management for management of their diabetes?  No  Would the patient like to be referred to a Nutritionist or for Diabetic Management?  No   Diabetic Exams:  Diabetic Eye Exam: Completed 01/22/22. Pt has been advised about the importance in completing this exam.  Diabetic Foot Exam: Completed 07/03/22. Pt has been advised about the importance in completing this exam.   Interpreter Needed?: No  Information entered by :: Kennedy Bucker, LPN   Activities of Daily Living    01/17/2023    8:25 AM  In your present state of health, do you have any difficulty performing the following activities:  Hearing? 0  Vision? 0  Difficulty concentrating or making decisions? 0  Walking or climbing stairs? 0  Dressing or bathing? 0  Doing errands, shopping? 0  Preparing Food and eating ? N  Using the Toilet? N  In the past six months, have you accidently leaked urine? N  Do you have problems with loss of bowel control? N  Managing your Medications? N  Managing your Finances? N  Housekeeping or managing your Housekeeping? N    Patient Care Team: Smitty Cords, DO as PCP - General (Family Medicine) Minor, Theadora Rama, RN (Inactive) as [employer] training [employer] education officer, [employer] any recent Medical Services you may have received from other than [facility] providers in the past year (date may be approximate).     Assessment:   This is a routine wellness examination for [place].  Hearing/Vision screen Hearing Screening - Comments:: No aids Vision Screening - Comments:: Readers- [facility]   Dietary issues and exercise activities discussed: Current Exercise Habits: Home exercise routine, Type of exercise: walking, Time (Minutes): 30, Frequency (Times/Week): 3, Weekly Exercise (Minutes/Week): 90   Goals Addressed  This Visit's Progress    DIET - INCREASE WATER INTAKE         Depression  Screen    01/17/2023    8:22 AM 01/01/2023   11:10 AM 07/03/2022   11:13 AM 02/14/2022   10:38 AM 12/28/2021    2:02 PM 12/17/2021    9:32 AM 04/16/2021    9:52 AM  PHQ 2/9 Scores  PHQ - 2 Score 0 0 0 0 1 0 0  PHQ- 9 Score 0 2  2 2 2 2     Fall Risk    01/17/2023    8:24 AM 01/01/2023   11:10 AM 07/03/2022   11:12 AM 02/14/2022   10:38 AM 12/28/2021    2:14 PM  Fall Risk   Falls in the past year? 0 0 0 0 0  Number falls in past yr: 0 0 0 0 0  Injury with Fall? 0 0 0 0 0  Risk for fall due to : No Fall Risks No Fall Risks No Fall Risks No Fall Risks No Fall Risks  Follow up Falls prevention discussed;Falls evaluation completed Falls evaluation completed Falls evaluation completed Falls evaluation completed Falls evaluation completed    FALL RISK PREVENTION PERTAINING TO THE HOME:  Any stairs in or around the home? Yes  If so, are there any without handrails? No  Home free of loose throw rugs in walkways, pet beds, electrical cords, etc? Yes  Adequate lighting in your home to reduce risk of falls? Yes   ASSISTIVE DEVICES UTILIZED TO PREVENT FALLS:  Life alert? No  Use of a cane, walker or w/c? Yes walker w/ seat Grab bars in the bathroom? Yes  Shower chair or bench in shower? No  Elevated toilet seat or a handicapped toilet? Yes    Cognitive Function:        12/26/2020    9:11 AM  6CIT Screen  What Year? 0 points  What month? 0 points  What time? 0 points  Count back from 20 0 points  Months in reverse 0 points  Repeat phrase 2 points  Total Score 2 points    Immunizations Immunization History  Administered Date(s) Administered   Fluad Quad(high Dose 65+) 08/30/2019   Influenza Inj Mdck Quad Pf 08/30/2019   Influenza Split 07/07/2013   Influenza, High Dose Seasonal PF 07/08/2017, 07/13/2018   Influenza,inj,Quad PF,6+ Mos 06/07/2015, 08/15/2016   Influenza-Unspecified 12/02/2020, 09/06/2021, 07/29/2022   Pneumococcal Conjugate-13 12/21/2014   Pneumococcal  Polysaccharide-23 07/08/2017   Zoster Recombinat (Shingrix) 02/13/2018, 04/16/2018    TDAP status: Due, Education has been provided regarding the importance of this vaccine. Advised may receive this vaccine at local pharmacy or Health Dept. Aware to provide a copy of the vaccination record if obtained from local pharmacy or Health Dept. Verbalized acceptance and understanding.  Flu Vaccine status: Up to date  Pneumococcal vaccine status: Up to date  Covid-19 vaccine status: Declined, Education has been provided regarding the importance of this vaccine but patient still declined. Advised may receive this vaccine at local pharmacy or Health Dept.or vaccine clinic. Aware to provide a copy of the vaccination record if obtained from local pharmacy or Health Dept. Verbalized acceptance and understanding.  Qualifies for Shingles Vaccine? Yes   Zostavax completed No   Shingrix Completed?: Yes  Screening Tests Health Maintenance  Topic Date Due   Diabetic kidney evaluation - Urine ACR  Never done   OPHTHALMOLOGY EXAM  01/23/2023   INFLUENZA VACCINE  05/08/2023   HEMOGLOBIN  A1C  06/27/2023   FOOT EXAM  07/04/2023   Diabetic kidney evaluation - eGFR measurement  12/25/2023   Medicare Annual Wellness (AWV)  01/17/2024   Pneumonia Vaccine 10+ Years old  Completed   DEXA SCAN  Completed   Hepatitis C Screening  Completed   Zoster Vaccines- Shingrix  Completed   HPV VACCINES  Aged Out   DTaP/Tdap/Td  Discontinued   COVID-19 Vaccine  Discontinued   Fecal DNA (Cologuard)  Discontinued    Health Maintenance  Health Maintenance Due  Topic Date Due   Diabetic kidney evaluation - Urine ACR  Never done    Colorectal cancer screening: No longer required.   Mammogram status: No longer required due to age.  Bone Density status: Ordered 01/17/23. Pt provided with contact info and advised to call to schedule appt.  Lung Cancer Screening: (Low Dose CT Chest recommended if Age 75-80 years, 30  pack-year currently smoking OR have quit w/in 15years.) does not qualify.   Additional Screening:  Hepatitis C Screening: does qualify; Completed 06/25/13  Vision Screening: Recommended annual ophthalmology exams for early detection of glaucoma and other disorders of the eye. Is the patient up to date with their annual eye exam?  Yes  Who is the provider or what is the name of the office in which the patient attends annual eye exams? [facility]  If pt is not established with a provider, would they like to be referred to a provider to establish care? No .   Dental Screening: Recommended annual dental exams for proper oral hygiene  Community Resource Referral / Chronic Care Management: CRR required this visit?  No   CCM required this visit?  No      Plan:     I have personally reviewed and noted the following in the patient's chart:   Medical and social history Use of alcohol, tobacco or illicit drugs  Current medications and supplements including opioid prescriptions. Patient is not currently taking opioid prescriptions. Functional ability and status Nutritional status Physical activity Advanced directives List of other physicians Hospitalizations, surgeries, and ER visits in previous 12 months Vitals Screenings to include cognitive, depression, and falls Referrals and appointments  In addition, I have reviewed and discussed with patient certain preventive protocols, quality metrics, and best practice recommendations. A written personalized care plan for preventive services as well as general preventive health recommendations were provided to patient.     Hal Hope, LPN   [id number]   Nurse Notes: none

## 2023-01-17 NOTE — Patient Instructions (Signed)
Ms. Yvette [name] , Thank you for taking time to come for your Medicare Wellness Visit. I appreciate your ongoing commitment to your health goals. Please review the following plan we discussed and let me know if I can assist you in the future.   These are the goals we discussed:  Goals      DIET - EAT MORE FRUITS AND VEGETABLES     DIET - INCREASE WATER INTAKE     Patient Stated     12/26/2020, wants to weigh 195 pounds        This is a list of the screening recommended for you and due dates:  Health Maintenance  Topic Date Due   Yearly kidney health urinalysis for diabetes  Never done   Eye exam for diabetics  01/23/2023   Flu Shot  05/08/2023   Hemoglobin A1C  06/27/2023   Complete foot exam   07/04/2023   Yearly kidney function blood test for diabetes  12/25/2023   Medicare Annual Wellness Visit  01/17/2024   Pneumonia Vaccine  Completed   DEXA scan (bone density measurement)  Completed   Hepatitis C Screening: USPSTF Recommendation to screen - Ages 55-79 yo.  Completed   Zoster (Shingles) Vaccine  Completed   HPV Vaccine  Aged Out   DTaP/Tdap/Td vaccine  Discontinued   COVID-19 Vaccine  Discontinued   Cologuard (Stool DNA test)  Discontinued    Advanced directives: no  Conditions/risks identified: none  Next appointment: Follow up in one year for your annual wellness visit 01/23/24 @ 8:15 am by phone   Preventive Care 65 Years and Older, Female Preventive care refers to lifestyle choices and visits with your health care provider that can promote health and wellness. What does preventive care include? A yearly physical exam. This is also called an annual well check. Dental exams once or twice a year. Routine eye exams. Ask your health care provider how often you should have your eyes checked. Personal lifestyle choices, including: Daily care of your teeth and gums. Regular physical activity. Eating a healthy diet. Avoiding tobacco and drug use. Limiting alcohol  use. Practicing safe sex. Taking low-dose aspirin every day. Taking vitamin and mineral supplements as recommended by your health care provider. What happens during an annual well check? The services and screenings done by your health care provider during your annual well check will depend on your age, overall health, lifestyle risk factors, and family history of disease. Counseling  Your health care provider may ask you questions about your: Alcohol use. Tobacco use. Drug use. Emotional well-being. Home and relationship well-being. Sexual activity. Eating habits. History of falls. Memory and ability to understand (cognition). Work and work Astronomer. Reproductive health. Screening  You may have the following tests or measurements: Height, weight, and BMI. Blood pressure. Lipid and cholesterol levels. These may be checked every 5 years, or more frequently if you are over 94 years old. Skin check. Lung cancer screening. You may have this screening every year starting at age 53 if you have a 30-pack-year history of smoking and currently smoke or have quit within the past 15 years. Fecal occult blood test (FOBT) of the stool. You may have this test every year starting at age 52. Flexible sigmoidoscopy or colonoscopy. You may have a sigmoidoscopy every 5 years or a colonoscopy every 10 years starting at age 32. Hepatitis C blood test. Hepatitis B blood test. Sexually transmitted disease (STD) testing. Diabetes screening. This is done by checking your blood sugar (glucose)  after you have not eaten for a while (fasting). You may have this done every 1-3 years. Bone density scan. This is done to screen for osteoporosis. You may have this done starting at age 52. Mammogram. This may be done every 1-2 years. Talk to your health care provider about how often you should have regular mammograms. Talk with your health care provider about your test results, treatment options, and if necessary,  the need for more tests. Vaccines  Your health care provider may recommend certain vaccines, such as: Influenza vaccine. This is recommended every year. Tetanus, diphtheria, and acellular pertussis (Tdap, Td) vaccine. You may need a Td booster every 10 years. Zoster vaccine. You may need this after age 31. Pneumococcal 13-valent conjugate (PCV13) vaccine. One dose is recommended after age 19. Pneumococcal polysaccharide (PPSV23) vaccine. One dose is recommended after age 56. Talk to your health care provider about which screenings and vaccines you need and how often you need them. This information is not intended to replace advice given to you by your health care provider. Make sure you discuss any questions you have with your health care provider. Document Released: 10/20/2015 Document Revised: 06/12/2016 Document Reviewed: 07/25/2015 Elsevier Interactive Patient Education  2017 [employer] Prevention in the Home Falls can cause injuries. They can happen to people of all ages. There are many things you can do to make your home safe and to help prevent falls. What can I do on the outside of my home? Regularly fix the edges of walkways and driveways and fix any cracks. Remove anything that might make you trip as you walk through a door, such as a raised step or threshold. Trim any bushes or trees on the path to your home. Use bright outdoor lighting. Clear any walking paths of anything that might make someone trip, such as rocks or tools. Regularly check to see if handrails are loose or broken. Make sure that both sides of any steps have handrails. Any raised decks and porches should have guardrails on the edges. Have any leaves, snow, or ice cleared regularly. Use sand or salt on walking paths during winter. Clean up any spills in your garage right away. This includes oil or grease spills. What can I do in the bathroom? Use night lights. Install grab bars by the toilet and in the  tub and shower. Do not use towel bars as grab bars. Use non-skid mats or decals in the tub or shower. If you need to sit down in the shower, use a plastic, non-slip stool. Keep the floor dry. Clean up any water that spills on the floor as soon as it happens. Remove soap buildup in the tub or shower regularly. Attach bath mats securely with double-sided non-slip rug tape. Do not have throw rugs and other things on the floor that can make you trip. What can I do in the bedroom? Use night lights. Make sure that you have a light by your bed that is easy to reach. Do not use any sheets or blankets that are too big for your bed. They should not hang down onto the floor. Have a firm chair that has side arms. You can use this for support while you get dressed. Do not have throw rugs and other things on the floor that can make you trip. What can I do in the kitchen? Clean up any spills right away. Avoid walking on wet floors. Keep items that you use a lot in easy-to-reach places. If  you need to reach something above you, use a strong step stool that has a grab bar. Keep electrical cords out of the way. Do not use floor polish or wax that makes floors slippery. If you must use wax, use non-skid floor wax. Do not have throw rugs and other things on the floor that can make you trip. What can I do with my stairs? Do not leave any items on the stairs. Make sure that there are handrails on both sides of the stairs and use them. Fix handrails that are broken or loose. Make sure that handrails are as long as the stairways. Check any carpeting to make sure that it is firmly attached to the stairs. Fix any carpet that is loose or worn. Avoid having throw rugs at the top or bottom of the stairs. If you do have throw rugs, attach them to the floor with carpet tape. Make sure that you have a light switch at the top of the stairs and the bottom of the stairs. If you do not have them, ask someone to add them for  you. What else can I do to help prevent falls? Wear shoes that: Do not have high heels. Have rubber bottoms. Are comfortable and fit you well. Are closed at the toe. Do not wear sandals. If you use a stepladder: Make sure that it is fully opened. Do not climb a closed stepladder. Make sure that both sides of the stepladder are locked into place. Ask someone to hold it for you, if possible. Clearly mark and make sure that you can see: Any grab bars or handrails. First and last steps. Where the edge of each step is. Use tools that help you move around (mobility aids) if they are needed. These include: Canes. Walkers. Scooters. Crutches. Turn on the lights when you go into a dark area. Replace any light bulbs as soon as they burn out. Set up your furniture so you have a clear path. Avoid moving your furniture around. If any of your floors are uneven, fix them. If there are any pets around you, be aware of where they are. Review your medicines with your doctor. Some medicines can make you feel dizzy. This can increase your chance of falling. Ask your doctor what other things that you can do to help prevent falls. This information is not intended to replace advice given to you by your health care provider. Make sure you discuss any questions you have with your health care provider. Document Released: 07/20/2009 Document Revised: 02/29/2016 Document Reviewed: 10/28/2014 Elsevier Interactive Patient Education  2017 [employer].

## 2023-03-09 ENCOUNTER — Encounter: Payer: Self-pay | Admitting: Family Medicine

## 2023-03-14 ENCOUNTER — Other Ambulatory Visit: Payer: Self-pay | Admitting: Family Medicine

## 2023-03-14 DIAGNOSIS — K219 Gastro-esophageal reflux disease without esophagitis: Secondary | ICD-10-CM

## 2023-03-14 NOTE — Telephone Encounter (Signed)
Requested Prescriptions  Pending Prescriptions Disp Refills   pantoprazole (PROTONIX) 40 MG tablet [Pharmacy Med Name: PANTOPRAZOLE SOD DR 40 MG TAB] 90 tablet 3    Sig: Take 1 tablet (40 mg total) by mouth daily before breakfast.     Gastroenterology: Proton Pump Inhibitors Passed - 03/14/2023 12:33 PM      Passed - Valid encounter within last 12 months    Recent Outpatient Visits           2 months ago Annual physical exam   [place] [facility] [place], Netta Neat, DO   8 months ago Type 2 diabetes mellitus with other specified complication, without long-term current use of insulin [facility])   [place] [facility] Smitty Cords, DO   1 year ago Rash   [place] [facility] Smitty Cords, DO   1 year ago Annual physical exam   [place] [facility] Smitty Cords, DO   1 year ago Hyperlipidemia associated with type 2 diabetes mellitus [facility])   [place] [facility] Althea Charon, Netta Neat, DO       Future Appointments             In 3 months Althea Charon, Netta Neat, DO [place] [facility], [facility]

## 2023-03-18 ENCOUNTER — Ambulatory Visit (INDEPENDENT_AMBULATORY_CARE_PROVIDER_SITE_OTHER): Payer: Medicare HMO | Admitting: Internal Medicine

## 2023-03-18 ENCOUNTER — Encounter: Payer: Self-pay | Admitting: Internal Medicine

## 2023-03-18 VITALS — BP 128/68 | HR 79 | Temp 96.2°F | Wt 205.0 lb

## 2023-03-18 DIAGNOSIS — R350 Frequency of micturition: Secondary | ICD-10-CM | POA: Diagnosis not present

## 2023-03-18 DIAGNOSIS — R3989 Other symptoms and signs involving the genitourinary system: Secondary | ICD-10-CM | POA: Diagnosis not present

## 2023-03-18 DIAGNOSIS — R300 Dysuria: Secondary | ICD-10-CM

## 2023-03-18 DIAGNOSIS — R3915 Urgency of urination: Secondary | ICD-10-CM

## 2023-03-18 LAB — POCT URINALYSIS DIPSTICK
Glucose, UA: NEGATIVE
Ketones, UA: NEGATIVE
Nitrite, UA: NEGATIVE
Protein, UA: NEGATIVE
Spec Grav, UA: 1.01 (ref 1.010–1.025)
Urobilinogen, UA: 1 E.U./dL
pH, UA: 7 (ref 5.0–8.0)

## 2023-03-18 NOTE — Progress Notes (Signed)
HPI  Pt presents to the clinic today with c/o urinary frequency, urgency, burning with urination and bladder pressure. This started about 2 weeks ago. She denies urgency, dysuria or blood in her urine. She denies vaginal complaints. She denies fever, chills, nausea or vomiting. She has tried AZO OTC with minimal great relief of symptoms.   Review of Systems  Past Medical History:  Diagnosis Date   Arthropathy, unspecified, site unspecified    Esophageal reflux    Osteoarthritis    Osteoporosis, unspecified    Other abnormal glucose    Other and unspecified hyperlipidemia    Symptomatic menopausal or female climacteric states    Syncope and collapse     Family History  Problem Relation Age of Onset   Diabetes Mother    Heart disease Mother    Heart attack Mother    Ulcers Father    Gallstones Father    Heart attack Brother     Social History   Socioeconomic History   Marital status: Widowed    Spouse name: Not on file   Number of children: Not on file   Years of education: Not on file   Highest education level: Not on file  Occupational History   Occupation: retired  Tobacco Use   Smoking status: Former    Packs/day: 1.00    Years: 26.00    Additional pack years: 0.00    Total pack years: 26.00    Types: Cigarettes    Quit date: 06/07/1992    Years since quitting: 30.7   Smokeless tobacco: Former  Building services engineer Use: Never used  Substance and Sexual Activity   Alcohol use: Not Currently    Alcohol/week: 0.0 standard drinks of alcohol    Comment: occassional   Drug use: No   Sexual activity: Not on file  Other Topics Concern   Not on file  Social History Narrative   Not on file   Social Determinants of Health   Financial Resource Strain: Low Risk  (01/17/2023)   Overall Financial Resource Strain (CARDIA)    Difficulty of Paying Living Expenses: Not hard at all  Food Insecurity: No Food Insecurity (01/17/2023)   Hunger Vital Sign    Worried About  Running Out of Food in the Last Year: Never true    Ran Out of Food in the Last Year: Never true  Transportation Needs: No Transportation Needs (01/17/2023)   PRAPARE - Administrator, Civil Service (Medical): No    Lack of Transportation (Non-Medical): No  Physical Activity: Insufficiently Active (01/17/2023)   Exercise Vital Sign    Days of Exercise per Week: 3 days    Minutes of Exercise per Session: 30 min  Stress: No Stress Concern Present (01/17/2023)   [employer] of Occupational Health - Occupational Stress Questionnaire    Feeling of Stress : Not at all  Social Connections: Moderately Isolated (01/17/2023)   Social Connection and Isolation Panel [NHANES]    Frequency of Communication with Friends and Family: More than three times a week    Frequency of Social Gatherings with Friends and Family: More than three times a week    Attends Religious Services: More than 4 times per year    Active Member of [employer] or Organizations: No    Attends Banker Meetings: Never    Marital Status: Widowed  Intimate Partner Violence: Not At Risk (01/17/2023)   Humiliation, Afraid, Rape, and Kick questionnaire    Fear of  Current or Ex-Partner: No    Emotionally Abused: No    Physically Abused: No    Sexually Abused: No    Allergies  Allergen Reactions   Simvastatin Other (See Comments)    Myalgia, muscle aches lower extremity   Erythromycin Nausea And Vomiting     Constitutional: Denies fever, malaise, fatigue, headache or abrupt weight changes.   GU: Pt reports frequency, urgency, burning with urination and bladder pressure. Denies dysuria, blood in urine, odor or discharge. Skin: Denies redness, rashes, lesions or ulcercations.   No other specific complaints in a complete review of systems (except as listed in HPI above).    Objective:   Physical Exam BP 128/68 (BP Location: Right Arm, Patient Position: Sitting, Cuff Size: Normal)   Pulse 79   Temp (!)  96.2 F (35.7 C) (Temporal)   Wt 205 lb (93 kg)   SpO2 96%   BMI 32.11 kg/m   Wt Readings from Last 3 Encounters:  01/17/23 208 lb (94.3 kg)  01/01/23 208 lb (94.3 kg)  07/03/22 210 lb 9.6 oz (95.5 kg)    General: Appears her stated age, obese, in NAD. Cardiovascular: Normal rate and rhythm. S1,S2 noted.   Pulmonary/Chest: Normal effort and positive vesicular breath sounds. No respiratory distress. No wheezes, rales or ronchi noted.  Abdomen: Soft and nontender. Normal bowel sounds. No distention or masses noted. No CVA tenderness.        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 Nicki Reaper, NP

## 2023-03-18 NOTE — Patient Instructions (Signed)
Urinary Tract Infection, Adult A urinary tract infection (UTI) is an infection of any part of the urinary tract. The urinary tract includes: The kidneys. The ureters. The bladder. The urethra. These organs make, store, and get rid of pee (urine) in the body. What are the causes? This infection is caused by germs (bacteria) in your genital area. These germs grow and cause swelling (inflammation) of your urinary tract. What increases the risk? The following factors may make you more likely to develop this condition: Using a small, thin tube (catheter) to drain pee. Not being able to control when you pee or poop (incontinence). Being female. If you are female, these things can increase the risk: Using these methods to prevent pregnancy: A medicine that kills sperm (spermicide). A device that blocks sperm (diaphragm). Having low levels of a female hormone (estrogen). Being pregnant. You are more likely to develop this condition if: You have genes that add to your risk. You are sexually active. You take antibiotic medicines. You have trouble peeing because of: A prostate that is bigger than normal, if you are female. A blockage in the part of your body that drains pee from the bladder. A kidney stone. A nerve condition that affects your bladder. Not getting enough to drink. Not peeing often enough. You have other conditions, such as: Diabetes. A weak disease-fighting system (immune system). Sickle cell disease. Gout. Injury of the spine. What are the signs or symptoms? Symptoms of this condition include: Needing to pee right away. Peeing small amounts often. Pain or burning when peeing. Blood in the pee. Pee that smells bad or not like normal. Trouble peeing. Pee that is cloudy. Fluid coming from the vagina, if you are female. Pain in the belly or lower back. Other symptoms include: Vomiting. Not feeling hungry. Feeling mixed up (confused). This may be the first symptom in  older adults. Being tired and grouchy (irritable). A fever. Watery poop (diarrhea). How is this treated? Taking antibiotic medicine. Taking other medicines. Drinking enough water. In some cases, you may need to see a specialist. Follow these instructions at home:  Medicines Take over-the-counter and prescription medicines only as told by your doctor. If you were prescribed an antibiotic medicine, take it as told by your doctor. Do not stop taking it even if you start to feel better. General instructions Make sure you: Pee until your bladder is empty. Do not hold pee for a long time. Empty your bladder after sex. Wipe from front to back after peeing or pooping if you are a female. Use each tissue one time when you wipe. Drink enough fluid to keep your pee pale yellow. Keep all follow-up visits. Contact a doctor if: You do not get better after 1-2 days. Your symptoms go away and then come back. Get help right away if: You have very bad back pain. You have very bad pain in your lower belly. You have a fever. You have chills. You feeling like you will vomit or you vomit. Summary A urinary tract infection (UTI) is an infection of any part of the urinary tract. This condition is caused by germs in your genital area. There are many risk factors for a UTI. Treatment includes antibiotic medicines. Drink enough fluid to keep your pee pale yellow. This information is not intended to replace advice given to you by your health care provider. Make sure you discuss any questions you have with your health care provider. Document Revised: 04/30/2020 Document Reviewed: 05/05/2020 Elsevier Patient Education    2024 Elsevier Inc.  

## 2023-03-19 LAB — URINE CULTURE
MICRO NUMBER:: 15067921
SPECIMEN QUALITY:: ADEQUATE

## 2023-03-20 ENCOUNTER — Encounter: Payer: Self-pay | Admitting: Internal Medicine

## 2023-03-26 ENCOUNTER — Ambulatory Visit
Admission: RE | Admit: 2023-03-26 | Discharge: 2023-03-26 | Disposition: A | Discharge status: Home / Self Care | Payer: Medicare HMO | Source: Ambulatory Visit | Attending: Family Medicine | Admitting: Family Medicine

## 2023-03-26 DIAGNOSIS — Z780 Asymptomatic menopausal state: Secondary | ICD-10-CM | POA: Diagnosis not present

## 2023-04-17 ENCOUNTER — Other Ambulatory Visit: Payer: Self-pay | Admitting: Family Medicine

## 2023-04-17 ENCOUNTER — Other Ambulatory Visit: Payer: Self-pay | Admitting: Neurology

## 2023-04-17 DIAGNOSIS — G8929 Other chronic pain: Secondary | ICD-10-CM

## 2023-04-17 DIAGNOSIS — M47816 Spondylosis without myelopathy or radiculopathy, lumbar region: Secondary | ICD-10-CM

## 2023-04-17 NOTE — Telephone Encounter (Signed)
Requested Prescriptions  Pending Prescriptions Disp Refills   naproxen (NAPROSYN) 500 MG tablet [Pharmacy Med Name: NAPROXEN500 MG TABLET] 60 tablet 0    Sig: Take 1 tablet (500 mg total) by mouth 2 (two) times daily with a meal. For 1-2 weeks then as needed     Analgesics:  NSAIDS Failed - 04/17/2023  8:34 AM      Failed - Manual Review: Labs are only required if the patient has taken medication for more than 8 weeks.      Failed - Cr in normal range and within 360 days    Creat  Date Value Ref Range Status  12/25/2022 0.58 (L) 0.60 - 1.00 mg/dL Final         Passed - HGB in normal range and within 360 days    Hemoglobin  Date Value Ref Range Status  12/25/2022 12.6 11.7 - 15.5 g/dL Final   HGB  Date Value Ref Range Status  11/20/2011 13.6 12.0 - 16.0 g/dL Final         Passed - PLT in normal range and within 360 days    Platelets  Date Value Ref Range Status  12/25/2022 173 140 - 400 Thousand/uL Final   Platelet  Date Value Ref Range Status  11/20/2011 191 150 - 440 x10 3/mm 3 Final         Passed - HCT in normal range and within 360 days    HCT  Date Value Ref Range Status  12/25/2022 38.1 35.0 - 45.0 % Final  11/20/2011 40.3 35.0 - 47.0 % Final         Passed - eGFR is 30 or above and within 360 days    GFR, Est African American  Date Value Ref Range Status  04/11/2021 91 > OR = 60 mL/min/1.38m2 Final   GFR, Est Non African American  Date Value Ref Range Status  04/11/2021 78 > OR = 60 mL/min/1.17m2 Final   eGFR  Date Value Ref Range Status  12/25/2022 93 > OR = 60 mL/min/1.66m2 Final         Passed - Patient is not pregnant      Passed - Valid encounter within last 12 months    Recent Outpatient Visits           1 month ago Urinary frequency   [place] [facility] [place], Salvadore [place], NP   3 months ago Annual physical exam   [place] [facility] Smitty Cords, DO   9 months ago Type 2 diabetes mellitus  with other specified complication, without long-term current use of insulin [facility])   [place] [facility] [place], Netta Neat, DO   1 year ago Rash   [place] [facility] Smitty Cords, DO   1 year ago Annual physical exam   [place] [facility] Smitty Cords, DO       Future Appointments             In 2 months Althea Charon, Netta Neat, DO [place] [facility], [facility]

## 2023-05-13 ENCOUNTER — Encounter: Payer: Self-pay | Admitting: Internal Medicine

## 2023-05-13 ENCOUNTER — Telehealth (INDEPENDENT_AMBULATORY_CARE_PROVIDER_SITE_OTHER): Payer: Medicare HMO | Admitting: Internal Medicine

## 2023-05-13 VITALS — BP 168/86 | HR 73 | Temp 96.6°F | Wt 213.0 lb

## 2023-05-13 DIAGNOSIS — R3915 Urgency of urination: Secondary | ICD-10-CM | POA: Diagnosis not present

## 2023-05-13 DIAGNOSIS — R350 Frequency of micturition: Secondary | ICD-10-CM | POA: Diagnosis not present

## 2023-05-13 DIAGNOSIS — R3129 Other microscopic hematuria: Secondary | ICD-10-CM

## 2023-05-13 DIAGNOSIS — N3281 Overactive bladder: Secondary | ICD-10-CM | POA: Diagnosis not present

## 2023-05-13 DIAGNOSIS — N952 Postmenopausal atrophic vaginitis: Secondary | ICD-10-CM | POA: Diagnosis not present

## 2023-05-13 DIAGNOSIS — R300 Dysuria: Secondary | ICD-10-CM

## 2023-05-13 MED ORDER — ESTRADIOL 0.1 MG/GM VA CREA: TOPICAL_CREAM | VAGINAL | 12 refills | Status: DC

## 2023-05-13 NOTE — Progress Notes (Signed)
Virtual Visit via Video Note  I connected with Yvette [name] on 05/13/23 at 11:00 AM EDT by a video enabled telemedicine application and verified that I am speaking with the correct person using two identifiers.  Location: Patient: Office Provider: Home  Persons participating in this video call: Nicki Reaper, NP and Daune Perch.   I discussed the limitations of evaluation and management by telemedicine and the availability of in person appointments. The patient expressed understanding and agreed to proceed.  History of Present Illness:  Patient wanting to follow-up on urinary urgency, frequency and burning with urination.  This has been a persistent issue for the last few months.  She reports the urgency is so bad that she is having incontinence and having to wear an adult diaper.  She is taking hydrochlorothiazide daily for the swelling in her feet.  She was seen 6/11 by me for the same.  Urinalysis showed small leuks and trace blood.  Urine culture grew less than 10,000 colonies of a single gram organism.  She was not treated with antibiotics at that time.  We discussed that this could be due to atrophic postmenopausal vaginal changes versus overactive bladder.  She has had a hysterectomy.   Past Medical History:  Diagnosis Date   Arthropathy, unspecified, site unspecified    Esophageal reflux    Osteoarthritis    Osteoporosis, unspecified    Other abnormal glucose    Other and unspecified hyperlipidemia    Symptomatic menopausal or female climacteric states    Syncope and collapse     Current Outpatient Medications  Medication Sig Dispense Refill   Acetaminophen (TYLENOL 8 HOUR ARTHRITIS PAIN PO) Take by mouth. 2 in am 1 in pm     albuterol (VENTOLIN HFA) 108 (90 Base) MCG/ACT inhaler Inhale 2 puffs into the lungs every 4 (four) hours as needed for wheezing or shortness of breath (cough). 8.5 g 2   amLODipine (NORVASC) 10 MG tablet Take 1 tablet (10 mg total) by mouth daily. 90  tablet 3   ascorbic acid (VITAMIN C) 500 MG tablet Take by mouth.     aspirin 81 MG tablet Take 81 mg by mouth daily.     beta carotene w/minerals (OCUVITE) tablet Take 1 tablet by mouth daily.     Calcium-Phosphorus-Vitamin D (CITRACAL +D3 PO) Take 1 Dose by mouth 2 (two) times daily.     Cetirizine HCl 10 MG CAPS Take by mouth.     Cholecalciferol 10 MCG (400 UNIT) CAPS Take 1 tablet by mouth daily with breakfast.     Co-Enzyme Q-10 100 MG CAPS Take 1 capsule by mouth daily. (Patient not taking: Reported on 03/18/2023)     fluticasone (FLONASE) 50 MCG/ACT nasal spray Place 2 sprays into both nostrils daily. 16 g 5   furosemide (LASIX) 20 MG tablet Take 1-2 tablets (20-40 mg total) by mouth daily as needed for fluid or edema. Recommend use up to 3-5 days at a time if needed. 30 tablet 2   gabapentin (NEURONTIN) 300 MG capsule Take 2 capsules (600 mg total) by mouth at bedtime as needed. 60 capsule 0   hydrochlorothiazide (HYDRODIURIL) 25 MG tablet Take 1 tablet (25 mg total) by mouth daily. 90 tablet 3   ipratropium (ATROVENT) 0.06 % nasal spray Place 2 sprays into both nostrils 4 (four) times daily. For up to 5-7 days then stop. 15 mL 0   irbesartan (AVAPRO) 300 MG tablet Take 1 tablet (300 mg total) by mouth daily. 90  tablet 3   Krill Oil 300 MG CAPS Take by mouth daily.     loratadine (CLARITIN) 10 MG tablet Take 10 mg by mouth daily.     Mag Aspart-Potassium Aspart (POTASSIUM & MAGNESIUM ASPARTAT PO) Take 1 tablet by mouth daily.     metFORMIN (GLUCOPHAGE) 500 MG tablet Take 1 tablet (500 mg total) by mouth 2 (two) times daily with a meal. (Patient not taking: Reported on 01/17/2023) 180 tablet 1   Multiple Vitamin (MULTIVITAMIN) tablet Take 1 tablet by mouth daily.     naproxen (NAPROSYN) 500 MG tablet Take 1 tablet (500 mg total) by mouth 2 (two) times daily with a meal. For 1-2 weeks then as needed 60 tablet 0   pantoprazole (PROTONIX) 40 MG tablet Take 1 tablet (40 mg total) by mouth daily  before breakfast. 90 tablet 3   No current facility-administered medications for this visit.    Allergies  Allergen Reactions   Simvastatin Other (See Comments)    Myalgia, muscle aches lower extremity   Erythromycin Nausea And Vomiting    Family History  Problem Relation Age of Onset   Diabetes Mother    Heart disease Mother    Heart attack Mother    Ulcers Father    Gallstones Father    Heart attack Brother     Social History   Socioeconomic History   Marital status: Widowed    Spouse name: Not on file   Number of children: Not on file   Years of education: Not on file   Highest education level: Not on file  Occupational History   Occupation: retired  Tobacco Use   Smoking status: Former    Current packs/day: 0.00    Average packs/day: 1 pack/day for 26.0 years (26.0 ttl pk-yrs)    Types: Cigarettes    Start date: 06/07/1966    Quit date: 06/07/1992    Years since quitting: 30.9   Smokeless tobacco: Former  Building services engineer status: Never Used  Substance and Sexual Activity   Alcohol use: Not Currently    Alcohol/week: 0.0 standard drinks of alcohol    Comment: occassional   Drug use: No   Sexual activity: Not on file  Other Topics Concern   Not on file  Social History Narrative   Not on file   Social Determinants of Health   Financial Resource Strain: Low Risk  (01/17/2023)   Overall Financial Resource Strain (CARDIA)    Difficulty of Paying Living Expenses: Not hard at all  Food Insecurity: No Food Insecurity (01/17/2023)   Hunger Vital Sign    Worried About Running Out of Food in the Last Year: Never true    Ran Out of Food in the Last Year: Never true  Transportation Needs: No Transportation Needs (01/17/2023)   PRAPARE - Administrator, Civil Service (Medical): No    Lack of Transportation (Non-Medical): No  Physical Activity: Insufficiently Active (01/17/2023)   Exercise Vital Sign    Days of Exercise per Week: 3 days    Minutes of  Exercise per Session: 30 min  Stress: No Stress Concern Present (01/17/2023)   [employer] of Occupational Health - Occupational Stress Questionnaire    Feeling of Stress : Not at all  Social Connections: Moderately Isolated (01/17/2023)   Social Connection and Isolation Panel [NHANES]    Frequency of Communication with Friends and Family: More than three times a week    Frequency of Social Gatherings with Friends  and Family: More than three times a week    Attends Religious Services: More than 4 times per year    Active Member of Clubs or Organizations: No    Attends Banker Meetings: Never    Marital Status: Widowed  Intimate Partner Violence: Not At Risk (01/17/2023)   Humiliation, Afraid, Rape, and Kick questionnaire    Fear of Current or Ex-Partner: No    Emotionally Abused: No    Physically Abused: No    Sexually Abused: No     Constitutional: Denies fever, malaise, fatigue, headache or abrupt weight changes.  Respiratory: Denies difficulty breathing, shortness of breath, cough or sputum production.   Cardiovascular: Denies chest pain, chest tightness, palpitations or swelling in the hands or feet.  Gastrointestinal: Denies abdominal pain, bloating, constipation, diarrhea or blood in the stool.  GU: Patient reports urinary urgency, frequency, burning with urination and incontinence.  Denies pain with urination, blood in urine, odor or discharge. Skin: Denies redness, rashes, lesions or ulcercations.  Neurological: Denies dizziness, difficulty with memory, difficulty with speech or problems with balance and coordination.    No other specific complaints in a complete review of systems (except as listed in HPI above).  Observations/Objective:  BP (!) 168/86 (BP Location: Left Arm, Patient Position: Sitting, Cuff Size: Large)   Pulse 73   Temp (!) 96.6 F (35.9 C) (Temporal)   Wt 213 lb (96.6 kg)   SpO2 98%   BMI 33.36 kg/m   Wt Readings from Last 3  Encounters:  03/18/23 205 lb (93 kg)  01/17/23 208 lb (94.3 kg)  01/01/23 208 lb (94.3 kg)    General: Appears her stated age, obese, in NAD. Pulmonary/Chest: Normal effort. No respiratory distress.  Neurological: Alert and oriented.   BMET    Component Value Date/Time   NA 142 12/25/2022 0803   NA 141 11/20/2011 1950   K 4.8 12/25/2022 0803   K 4.1 11/20/2011 1950   CL 105 12/25/2022 0803   CL 100 11/20/2011 1950   CO2 26 12/25/2022 0803   CO2 30 11/20/2011 1950   GLUCOSE 131 (H) 12/25/2022 0803   GLUCOSE 158 (H) 11/20/2011 1950   BUN 14 12/25/2022 0803   BUN 21 (H) 11/20/2011 1950   CREATININE 0.58 (L) 12/25/2022 0803   CALCIUM 9.2 12/25/2022 0803   CALCIUM 9.4 11/20/2011 1950   GFRNONAA 78 04/11/2021 0829   GFRAA 91 04/11/2021 0829    Lipid Panel     Component Value Date/Time   CHOL 271 (H) 12/25/2022 0803   TRIG 293 (H) 12/25/2022 0803   HDL 51 12/25/2022 0803   CHOLHDL 5.3 (H) 12/25/2022 0803   VLDL 59 (H) 10/16/2016 0001   LDLCALC 170 (H) 12/25/2022 0803    CBC    Component Value Date/Time   WBC 4.2 12/25/2022 0803   RBC 4.43 12/25/2022 0803   HGB 12.6 12/25/2022 0803   HGB 13.6 11/20/2011 1950   HCT 38.1 12/25/2022 0803   HCT 40.3 11/20/2011 1950   PLT 173 12/25/2022 0803   PLT 191 11/20/2011 1950   MCV 86.0 12/25/2022 0803   MCV 90 11/20/2011 1950   MCH 28.4 12/25/2022 0803   MCHC 33.1 12/25/2022 0803   RDW 13.9 12/25/2022 0803   RDW 14.4 11/20/2011 1950   LYMPHSABS 995 12/25/2022 0803   MONOABS 418 10/16/2016 0001   EOSABS 80 12/25/2022 0803   BASOSABS 21 12/25/2022 0803    Hgb A1C Lab Results  Component Value Date  HGBA1C 6.7 (H) 12/25/2022       Assessment and Plan:  Urinary urgency, frequency, burning with urination, overactive bladder, postmenopausal vaginal atrophy:  Urinalysis shows trace protein and trace blood.  She has a history of renal cyst which could be contributing to the blood in her urine however we will have  refer to urology for further evaluation of her consistent microscopic hematuria We will send urine culture-no antibiotics indicated unless culture positive Will trial Estrace cream to see if this reduces her urinary symptoms Discussed that urology may want her to have treatment for OAB but will defer this to urology during evaluation Encourage Kegel exercises and discussed the possibility of pelvic floor therapy   Follow-up with your PCP as previously scheduled  Follow Up Instructions:     I discussed the assessment and treatment plan with the patient. The patient was provided an opportunity to ask questions and all were answered. The patient agreed with the plan and demonstrated an understanding of the instructions.   The patient was advised to call back or seek an in-person evaluation if the symptoms worsen or if the condition fails to improve as anticipated.     Nicki Reaper, NP;

## 2023-05-13 NOTE — Patient Instructions (Signed)

## 2023-05-19 ENCOUNTER — Other Ambulatory Visit: Payer: Self-pay

## 2023-05-19 DIAGNOSIS — R3129 Other microscopic hematuria: Secondary | ICD-10-CM

## 2023-05-20 ENCOUNTER — Encounter: Payer: Self-pay | Admitting: Urology

## 2023-05-20 ENCOUNTER — Ambulatory Visit: Payer: Medicare HMO | Admitting: Urology

## 2023-05-20 ENCOUNTER — Other Ambulatory Visit
Admission: RE | Admit: 2023-05-20 | Discharge: 2023-05-20 | Disposition: A | Discharge status: Home / Self Care | Payer: Medicare HMO | Attending: Urology | Admitting: Urology

## 2023-05-20 VITALS — BP 188/79 | HR 67 | Ht 65.0 in | Wt 212.0 lb

## 2023-05-20 DIAGNOSIS — R3129 Other microscopic hematuria: Secondary | ICD-10-CM | POA: Insufficient documentation

## 2023-05-20 DIAGNOSIS — R102 Pelvic and perineal pain: Secondary | ICD-10-CM

## 2023-05-20 DIAGNOSIS — R399 Unspecified symptoms and signs involving the genitourinary system: Secondary | ICD-10-CM

## 2023-05-20 DIAGNOSIS — R300 Dysuria: Secondary | ICD-10-CM

## 2023-05-20 LAB — URINALYSIS, COMPLETE (UACMP) WITH MICROSCOPIC
Bilirubin Urine: NEGATIVE
Glucose, UA: 100 mg/dL — AB
Leukocytes,Ua: NEGATIVE
Nitrite: POSITIVE — AB
Protein, ur: NEGATIVE mg/dL
Specific Gravity, Urine: >1.03 — ABNORMAL HIGH (ref 1.005–1.030)
pH: 5.5 (ref 5.0–8.0)

## 2023-05-20 MED ORDER — SULFAMETHOXAZOLE-TRIMETHOPRIM 800-160 MG PO TABS: 1.0000 | ORAL_TABLET | Freq: Two times a day (BID) | ORAL | 0 refills | Status: DC

## 2023-05-20 NOTE — Progress Notes (Signed)
   05/20/23 3:11 PM   Yvette [name] 04-08-1946 [id number]  CC: Lower urinary symptoms, dysuria, dipstick positive hematuria  HPI: 77 year old female who reports urinary symptoms since May 2024.  History is challenging to obtain, but sounds like she has had intermittent dysuria as well as some occasional frequency and urgency, and 1 episode of incontinence overnight.  She denies any gross hematuria.  Urine culture with PCP on 03/18/2023 and 05/13/2023 were negative.  She was recently prescribed topical estrogen cream and has used that for about a week.  No recent cross-sectional imaging to review.  Urinalysis today nitrite positive, leukocytes negative, 0-5 squamous cells, 0-5 WBC, 0-5 RBC, many bacteria.  Will send for culture.  PMH: Past Medical History:  Diagnosis Date   Arthropathy, unspecified, site unspecified    Esophageal reflux    Osteoarthritis    Osteoporosis, unspecified    Other abnormal glucose    Other and unspecified hyperlipidemia    Symptomatic menopausal or female climacteric states    Syncope and collapse     Surgical History: Past Surgical History:  Procedure Laterality Date   ABDOMINAL HYSTERECTOMY     APPENDECTOMY     BREAST BIOPSY Left 1970's   neg   COLONOSCOPY     CYST REMOVAL TRUNK  11/24/2020   from back   FINGER FRACTURE SURGERY      Family History: Family History  Problem Relation Age of Onset   Diabetes Mother    Heart disease Mother    Heart attack Mother    Ulcers Father    Gallstones Father    Heart attack Brother     Social History:  reports that she quit smoking about 30 years ago. Her smoking use included cigarettes. She started smoking about 56 years ago. She has a 26 pack-year smoking history. She has been exposed to tobacco smoke. She has quit using smokeless tobacco. She reports that she does not currently use alcohol. She reports that she does not use drugs.  Physical Exam: BP (!) 188/79   Pulse 67   Ht 5\' 5"  (1.651 m)    Wt 212 lb (96.2 kg)   BMI 35.28 kg/m    Constitutional:  Alert and oriented, No acute distress. Cardiovascular: No clubbing, cyanosis, or edema. Respiratory: Normal respiratory effort, no increased work of breathing. GI: Abdomen is soft, nontender, nondistended, no abdominal masses   Laboratory Data: Reviewed, see HPI   Assessment & Plan:   77 year old female with urinary symptoms of intermittent dysuria and pelvic discomfort of unclear etiology, possible causes include infection, urethritis, OAB, interstitial cystitis/chronic bladder pain, pelvic floor dysfunction, genitourinary syndrome of menopause.  She does not have any microscopic hematuria that would warrant further evaluation at this time with CT and cystoscopy.  We reviewed the differences between dipstick positive hemoglobin and true microscopic hematuria.  I think with her nitrite positive urine today and many bacteria that she warrants at least a short course of antibiotics even with her negative cultures in the past.  Will send urine today for culture and atypicals as well.  Agree with continuing topical estrogen cream.  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

## 2023-05-21 ENCOUNTER — Other Ambulatory Visit: Payer: Self-pay | Admitting: Family Medicine

## 2023-05-21 DIAGNOSIS — G8929 Other chronic pain: Secondary | ICD-10-CM

## 2023-05-21 DIAGNOSIS — M47816 Spondylosis without myelopathy or radiculopathy, lumbar region: Secondary | ICD-10-CM

## 2023-05-23 NOTE — Telephone Encounter (Signed)
Requested Prescriptions  Pending Prescriptions Disp Refills   naproxen (NAPROSYN) 500 MG tablet [Pharmacy Med Name: NAPROXEN 500 MG TABLET] 60 tablet 2    Sig: Take 1 tablet (500 mg total) by mouth 2 (two) times daily with a meal. For 1-2 weeks then as needed     Analgesics:  NSAIDS Failed - 05/21/2023  3:27 PM      Failed - Manual Review: Labs are only required if the patient has taken medication for more than 8 weeks.      Failed - Cr in normal range and within 360 days    Creat  Date Value Ref Range Status  12/25/2022 0.58 (L) 0.60 - 1.00 mg/dL Final         Passed - HGB in normal range and within 360 days    Hemoglobin  Date Value Ref Range Status  12/25/2022 12.6 11.7 - 15.5 g/dL Final   HGB  Date Value Ref Range Status  11/20/2011 13.6 12.0 - 16.0 g/dL Final         Passed - PLT in normal range and within 360 days    Platelets  Date Value Ref Range Status  12/25/2022 173 140 - 400 Thousand/uL Final   Platelet  Date Value Ref Range Status  11/20/2011 191 150 - 440 x10 3/mm 3 Final         Passed - HCT in normal range and within 360 days    HCT  Date Value Ref Range Status  12/25/2022 38.1 35.0 - 45.0 % Final  11/20/2011 40.3 35.0 - 47.0 % Final         Passed - eGFR is 30 or above and within 360 days    GFR, Est African American  Date Value Ref Range Status  04/11/2021 91 > OR = 60 mL/min/1.104m2 Final   GFR, Est Non African American  Date Value Ref Range Status  04/11/2021 78 > OR = 60 mL/min/1.60m2 Final   eGFR  Date Value Ref Range Status  12/25/2022 93 > OR = 60 mL/min/1.32m2 Final         Passed - Patient is not pregnant      Passed - Valid encounter within last 12 months    Recent Outpatient Visits           1 week ago Urinary frequency   [place] [facility] [place], Salvadore Oxford, NP   2 months ago Urinary frequency   [place] [facility] [place], Salvadore Oxford, NP   4 months ago Annual physical exam   [place]  [facility] Smitty Cords, DO   10 months ago Type 2 diabetes mellitus with other specified complication, without long-term current use of insulin [facility])   [place] [facility] [place], Netta Neat, DO   1 year ago Rash    [facility] Smitty Cords, DO       Future Appointments             In 1 month Richardo Hanks, Laurette Schimke, MD [facility] [place]   In 1 month Althea Charon, Netta Neat, DO  [facility], [facility]

## 2023-05-27 LAB — MISC LABCORP TEST (SEND OUT)

## 2023-06-26 ENCOUNTER — Other Ambulatory Visit: Payer: Self-pay | Admitting: Neurology

## 2023-07-01 ENCOUNTER — Ambulatory Visit: Payer: Medicare HMO | Admitting: Urology

## 2023-07-01 ENCOUNTER — Encounter: Payer: Self-pay | Admitting: Urology

## 2023-07-01 DIAGNOSIS — R350 Frequency of micturition: Secondary | ICD-10-CM | POA: Diagnosis not present

## 2023-07-01 DIAGNOSIS — R300 Dysuria: Secondary | ICD-10-CM

## 2023-07-01 DIAGNOSIS — N952 Postmenopausal atrophic vaginitis: Secondary | ICD-10-CM

## 2023-07-01 DIAGNOSIS — R3915 Urgency of urination: Secondary | ICD-10-CM

## 2023-07-01 DIAGNOSIS — R399 Unspecified symptoms and signs involving the genitourinary system: Secondary | ICD-10-CM

## 2023-07-01 MED ORDER — GEMTESA 75 MG PO TABS: 75.0000 mg | ORAL_TABLET | Freq: Every day | ORAL | Status: DC

## 2023-07-01 NOTE — Progress Notes (Signed)
07/01/2023 1:42 PM   SINNIE PRADA 1946/05/26 [id number]  Reason for visit: Follow up dysuria, urgency/frequency, possible UTIs, genitourinary syndrome of menopause  HPI: 77 year old female who I saw in August 2020 for for urinary symptoms with intermittent dysuria and urgency/frequency.  Urinalysis showed no evidence of microscopic hematuria, but was nitrite positive with many bacteria, unfortunately this was not sent for culture.  She opted for a 3-day course of Bactrim for possible UTI, as well as topical estrogen cream.  She reports significant improvement since that time.  She denies any dysuria or pelvic pain.  She still is having some frequent seen urgency, rare urge incontinence.  It is bothersome enough that she is interested in an OAB medication.  We discussed that overactive bladder (OAB) is not a disease, but is a symptom complex that is generally not life-threatening.  Symptoms typically include urinary urgency, frequency, and urge incontinence.  There are numerous treatment options, however there are risks and benefits with both medical and surgical management.  First-line treatment is behavioral therapies including bladder training, pelvic floor muscle training, and fluid management.  Second line treatments include oral antimuscarinics(Ditropan er, Trospium) and beta-3 agonist (Mybetriq). There is typically a period of medication trial (4-8 weeks) to find the optimal therapy and dosing. If symptoms are bothersome despite the above management, third line options include intra-detrusor botox, peripheral tibial nerve stimulation (PTNS), and interstim (SNS). These are more invasive treatments with higher side effect profile, but may improve quality of life for patients with severe OAB symptoms.   Trial of Gemtesa, samples given.  With her age, frailty, fall risk not a good candidate for anticholinergics RTC 3 months PA symptom check  Sondra Come, MD  [facility] [address] [place], [place] [address] [phone]

## 2023-07-08 ENCOUNTER — Ambulatory Visit (INDEPENDENT_AMBULATORY_CARE_PROVIDER_SITE_OTHER): Payer: Medicare HMO | Admitting: Family Medicine

## 2023-07-08 ENCOUNTER — Other Ambulatory Visit: Payer: Self-pay | Admitting: Family Medicine

## 2023-07-08 ENCOUNTER — Encounter: Payer: Self-pay | Admitting: Family Medicine

## 2023-07-08 VITALS — BP 156/86 | HR 76 | Ht 65.0 in | Wt 211.0 lb

## 2023-07-08 DIAGNOSIS — E1169 Type 2 diabetes mellitus with other specified complication: Secondary | ICD-10-CM | POA: Diagnosis not present

## 2023-07-08 DIAGNOSIS — M47816 Spondylosis without myelopathy or radiculopathy, lumbar region: Secondary | ICD-10-CM

## 2023-07-08 DIAGNOSIS — I10 Essential (primary) hypertension: Secondary | ICD-10-CM

## 2023-07-08 DIAGNOSIS — Z0000 Encounter for general adult medical examination without abnormal findings: Secondary | ICD-10-CM

## 2023-07-08 DIAGNOSIS — E785 Hyperlipidemia, unspecified: Secondary | ICD-10-CM

## 2023-07-08 LAB — POCT GLYCOSYLATED HEMOGLOBIN (HGB A1C): Hemoglobin A1C: 5.9 % — AB (ref 4.0–5.6)

## 2023-07-08 NOTE — Patient Instructions (Addendum)
Thank you for coming to the office today.  Improved blood sugar  A1c improved  Recent Labs    12/25/22 0803 07/08/23 1116  HGBA1C 6.7* 5.9*   Remain OFF Metformin  Keep watching BP closely As discussed - stressors, pain, activity, weight all can raise.  Let me know if you need help with BP can consider 4th med if need if persistent elevation.  Urine test today if possible for kidney function.  DUE for FASTING BLOOD WORK (no food or drink after midnight before the lab appointment, only water or coffee without cream/sugar on the morning of)  SCHEDULE "Lab Only" visit in the morning at the clinic for lab draw in 6 MONTHS   - Make sure Lab Only appointment is at about 1 week before your next appointment, so that results will be available  For Lab Results, once available within 2-3 days of blood draw, you can can log in to MyChart online to view your results and a brief explanation. Also, we can discuss results at next follow-up visit.   Please schedule a Follow-up Appointment to: Return in about 6 months (around 01/06/2024) for 6 month fasting lab only then 1 week later Annual Physical.  If you have any other questions or concerns, please feel free to call the office or send a message through MyChart. You may also schedule an earlier appointment if necessary.  Additionally, you may be receiving a survey about your experience at our office within a few days to 1 week by e-mail or mail. We value your feedback.  Saralyn Pilar, DO [facility], [place]

## 2023-07-08 NOTE — Progress Notes (Signed)
Subjective:    Patient ID: Yvette [name], female    DOB: 06-08-1946, 77 y.o.   MRN: [id number]  Yvette [name] is a 77 y.o. female presenting on 07/08/2023 for Diabetes, Hyperlipidemia, and Arthritis (/)   HPI  Discussed the use of AI scribe software for clinical note transcription with the patient, who gave verbal consent to proceed.  The patient, with a history of diabetes, hypertension, and chronic pain, presents for a six-month follow-up. They report a significant improvement in their diabetes control, with their recent HbA1c down to 5.9 from 6.7 six months ago. This improvement was achieved without the use of metformin, which was discontinued at the last visit. The patient attributes their success to dietary changes, including reduced portion sizes and mindful eating habits.  However, the patient has been experiencing some challenges with their blood pressure control. They report that their blood pressure readings at home are often elevated, particularly after physical activity or when they are experiencing pain. They also note that their weight has increased slightly, which they believe may be contributing to their blood pressure issues.  In addition to their diabetes and hypertension, the patient also reports occasional tingling in their feet and mild swelling, which improves with movement. They have not noticed any skin sores or ulcerations. The patient also mentions that they have not had their annual eye exam yet, but plans to schedule it soon.  Type 2 Diabetes / Hyperglycemia / Morbid Obesity BMI >35 She has done well overall.  She has continued to improve her diet, reduce portions Discontinued Metformin 500 TWICE A DAY since last visit. Prior A1c 6.7, now today due for A1c Currently on ACEi, ASA 81, statin Lifestyle: Weight remains down 2-3 lbs since prior visit - Diet goal to improve diet still. She does admit some candy increased at times. - Exercise: Improved activity with  some wt loss Admits had some polyuria prior, now seems to be reduced  Admits occasional tingling in feet. [facility] - mild cataract no surgery needed, no DM retinopathy, last done 01/2022 will be due in 2024. Pending scheduling Denies hypoglycemia, visual changes, numbness .  HYPERLIPIDEMIA / Familial Hyperlipidemia Last Lipid 03/2023 LDL still elevated 170s Has failed Statin therapy, Zetia, Rosuvastatin. Also tried PCSK9 inhibitor therapy Repatha She has famlilal cholesterol history and hyperTG Off medication She is trying to help manage and control this on her own with diet    CHRONIC HTN: Home BP elevated still. Higher in doctors office. Fluctuated at times. Elevated today with stressors and anxiety and pain can raise it also with physical activity. Weight down to 208 improved and higher with inc wt Current Meds - Amlodipine 10mg  daily, Irbesartan 300mg  daily, HCTZ 25mg  daily Reports normally good compliance. Tolerating well, w/o complaints. Admits swelling of feet and hands if inc salt intake Denies CP, dyspnea, HA, dizziness / lightheadedness   Lumbar Radiculopathy Osteoarthritis hips Followed by Ortho and Neurology She had nerve testing by Dr Terrace Arabia, no surgery needed. Dx DDD spine  Unlikely to have spine surgery in future She is improving her ambulation and her nerve strength and function On Gabapentin per Neuro   Vitamin B12 Last lab >2000 elevated, she has been taking B12 supplement every other day Vitamin D lab shows improved >30 now        01/17/2023    8:22 AM 01/01/2023   11:10 AM 07/03/2022   11:13 AM  Depression screen PHQ 2/9  Decreased Interest 0 0 0  Down, Depressed, Hopeless 0 0 0  PHQ - 2 Score 0 0 0  Altered sleeping 0 0   Tired, decreased energy 0 1 1  Change in appetite 0 1 1  Feeling bad or failure about yourself  0 0 0  Trouble concentrating 0 0 0  Moving slowly or fidgety/restless 0 0 0  Suicidal thoughts 0 0 0  PHQ-9 Score 0 2    Difficult doing work/chores Not difficult at all Not difficult at all Not difficult at all      01/01/2023   11:10 AM 07/03/2022   11:13 AM 02/14/2022   10:38 AM 12/17/2021    9:32 AM  GAD 7 : Generalized Anxiety Score  Nervous, Anxious, on Edge 0 0 0 0  Control/stop worrying 0 0 0 0  Worry too much - different things 0 0 0 0  Trouble relaxing 0 0 0 0  Restless 0 0 0 0  Easily annoyed or irritable 0 0 0 0  Afraid - awful might happen 0 0 0 0  Total GAD 7 Score 0 0 0 0  Anxiety Difficulty Not difficult at all Not difficult at all Not difficult at all Not difficult at all      Social History   Tobacco Use   Smoking status: Former    Current packs/day: 0.00    Average packs/day: 1 pack/day for 26.0 years (26.0 ttl pk-yrs)    Types: Cigarettes    Start date: 06/07/1966    Quit date: 06/07/1992    Years since quitting: 31.1    Passive exposure: Past   Smokeless tobacco: Former  Building services engineer status: Never Used  Substance Use Topics   Alcohol use: Not Currently    Alcohol/week: 0.0 standard drinks of alcohol    Comment: occassional   Drug use: No    Review of Systems Per HPI unless specifically indicated above     Objective:    BP (!) 156/86 (BP Location: Left Arm, Cuff Size: Large)   Pulse 76   Ht 5\' 5"  (1.651 m)   Wt 211 lb (95.7 kg)   SpO2 99%   BMI 35.11 kg/m   Wt Readings from Last 3 Encounters:  07/08/23 211 lb (95.7 kg)  05/20/23 212 lb (96.2 kg)  05/13/23 213 lb (96.6 kg)    Physical Exam Vitals and nursing note reviewed.  Constitutional:      General: She is not in acute distress.    Appearance: She is well-developed. She is not diaphoretic.     Comments: Well-appearing, comfortable, cooperative  HENT:     Head: Normocephalic and atraumatic.  Eyes:     General:        Right eye: No discharge.        Left eye: No discharge.     Conjunctiva/sclera: Conjunctivae normal.  Neck:     Thyroid: No thyromegaly.  Cardiovascular:     Rate and  Rhythm: Normal rate and regular rhythm.     Heart sounds: Normal heart sounds. No murmur heard. Pulmonary:     Effort: Pulmonary effort is normal. No respiratory distress.     Breath sounds: Normal breath sounds. No wheezing or rales.  Musculoskeletal:        General: Normal range of motion.     Cervical back: Normal range of motion and neck supple.  Lymphadenopathy:     Cervical: No cervical adenopathy.  Skin:    General: Skin is warm and dry.  Findings: No erythema or rash.  Neurological:     Mental Status: She is alert and oriented to person, place, and time.  Psychiatric:        Behavior: Behavior normal.     Comments: Well groomed, good eye contact, normal speech and thoughts     Diabetic Foot Exam - Simple   Simple Foot Form Diabetic Foot exam was performed with the following findings: Yes 07/08/2023 11:28 AM  Visual Inspection No deformities, no ulcerations, no other skin breakdown bilaterally: Yes Sensation Testing Intact to touch and monofilament testing bilaterally: Yes Pulse Check Posterior Tibialis and Dorsalis pulse intact bilaterally: Yes Comments      Results for orders placed or performed in visit on 07/08/23  POCT glycosylated hemoglobin (Hb A1C)  Result Value Ref Range   Hemoglobin A1C 5.9 (A) 4.0 - 5.6 %   HbA1c POC (<> result, manual entry)     HbA1c, POC (prediabetic range)     HbA1c, POC (controlled diabetic range)        Assessment & Plan:   Problem List Items Addressed This Visit     Essential hypertension    Elevated BP Still after repeat Home BP still elevated but seems labile, and elevated due to physical activity stressors pain etc.  Continue Amlodipine 10mg  daily, hydrochlorothiazide 25mg  daily, Irbesartan 300mg  daily  Follow-up as planned. We discussed option of 4th BP med, we will defer today      Hyperlipidemia associated with type 2 diabetes mellitus (HCC)    Previous Elevated LDL 170s off therapy Previously Dramatic  improved controlled on Repatha PCSK9 Failed Statin therapy Simvastatin, Atorvastatin, Rosuvastatin, intermittent dosing and failed Zetia  The 10-year ASCVD risk score (Arnett DK, et al., 2019) is: 64.6%  Plan: 1. Remain off therapy - we discussed importance in future if ready to resume therapy would reconsider PCSK9 2. Encourage improved lifestyle - low carb/cholesterol, reduce portion size, continue improving regular exercise      Spondylosis of lumbar region without myelopathy or radiculopathy   Type 2 diabetes mellitus with other specified complication (HCC) - Primary    Improved DM Control A1c down to 5.9 With lifestyle modification Concern with obesity, HTN, HLD  Plan:  OFF Metformin  Encourage improved lifestyle - low carb, low sugar diet, reduce portion size, continue improving regular exercise  DM Foot exam      Relevant Orders   POCT glycosylated hemoglobin (Hb A1C) (Completed)   Urine Microalbumin w/creat. ratio      General Health Maintenance -Schedule annual eye exam. -Collect urine sample today for annual protein check. -Schedule follow-up appointment in April for full blood panel.        Orders Placed This Encounter  Procedures   Urine Microalbumin w/creat. ratio   POCT glycosylated hemoglobin (Hb A1C)     No orders of the defined types were placed in this encounter.     Follow up plan: Return in about 6 months (around 01/06/2024) for 6 month fasting lab only then 1 week later Annual Physical.  Future labs ordered for 01/06/24   Saralyn Pilar, DO [facility] 07/08/2023, 11:15 AM

## 2023-07-08 NOTE — Assessment & Plan Note (Signed)
Improved DM Control A1c down to 5.9 With lifestyle modification Concern with obesity, HTN, HLD  Plan:  OFF Metformin  Encourage improved lifestyle - low carb, low sugar diet, reduce portion size, continue improving regular exercise  DM Foot exam

## 2023-07-08 NOTE — Assessment & Plan Note (Signed)
Previous Elevated LDL 170s off therapy Previously Dramatic improved controlled on Repatha PCSK9 Failed Statin therapy Simvastatin, Atorvastatin, Rosuvastatin, intermittent dosing and failed Zetia  The 10-year ASCVD risk score (Arnett DK, et al., 2019) is: 64.6%  Plan: 1. Remain off therapy - we discussed importance in future if ready to resume therapy would reconsider PCSK9 2. Encourage improved lifestyle - low carb/cholesterol, reduce portion size, continue improving regular exercise

## 2023-07-08 NOTE — Assessment & Plan Note (Signed)
Elevated BP Still after repeat Home BP still elevated but seems labile, and elevated due to physical activity stressors pain etc.  Continue Amlodipine 10mg  daily, hydrochlorothiazide 25mg  daily, Irbesartan 300mg  daily  Follow-up as planned. We discussed option of 4th BP med, we will defer today

## 2023-07-09 LAB — MICROALBUMIN / CREATININE URINE RATIO
Creatinine, Urine: 166 mg/dL (ref 20–275)
Microalb Creat Ratio: 18 mg/g{creat} (ref <30)
Microalb, Ur: 3 mg/dL

## 2023-07-30 ENCOUNTER — Ambulatory Visit: Payer: Self-pay

## 2023-07-30 NOTE — Telephone Encounter (Signed)
Patient called, left VM to return the call to the office to speak to the NT.    Summary: left leg dragging/left hand not opening and closing   Patient stated she partially fell off a stepping stool Monday night and Tuesday morning she was unable to walk as her left leg was dragging and left hand will not open and close. She can use her left hand but it feels like she can grip things. She did fall to the floor but not a hard fall and doesn't feel as if she broke anything. Please f/u with patient

## 2023-07-30 NOTE — Telephone Encounter (Signed)
Chief Complaint: Weakness Symptoms: left leg weakness, left hand weakness, near fall, dizziness Frequency: constant  Pertinent Negatives: Patient denies difficult speech, headache, blurred vision, numbness,  Disposition: [] ED /[] Urgent Care (no appt availability in office) / [x] Appointment(In office/virtual)/ []  [facility]/ [] Home Care/ [] Refused Recommended Disposition /[] [facility]/ []  Follow-up with PCP Additional Notes: Patient states on Monday evening she tried standing up on her step stool and her left leg became very weak and she had to slide down to the floor to avoid falling completely. Patient stated that her left hand also began to feel "funny" like it is not working right. Patient reports taking a shower after the episode and going to bed. Tuesday morning when she woke up she was unable to stand on the left leg it felt weak. She looked in the mirror to check for facial drooping, none noted, her speech was normal as well. Patient has been using a walker around the house because the left leg is dragging now. Care advice given and patient has been scheduled to be seen in the office tomorrow at 10am. Advised patient if new symptoms develop or if current symptoms get worse to seek care at the ED. Patient verbalized understanding.   Summary: left leg dragging/left hand not opening and closing     Patient stated she partially fell off a stepping stool Monday night and Tuesday morning she was unable to walk as her left leg was dragging and left hand will not open and close. She can use her left hand but it feels like she can grip things. She did fall to the floor but not a hard fall and doesn't feel as if she broke anything. Please f/u with patient     Reason for Disposition  [1] Weakness of the face, arm / hand, or leg / foot on one side of the body AND [2] gradual onset (e.g., days to weeks) AND [3] present now  Answer Assessment - Initial Assessment Questions 1.  SYMPTOM: "What is the main symptom you are concerned about?" (e.g., weakness, numbness)     Weakness 2. ONSET: "When did this start?" (minutes, hours, days; while sleeping)     Monday night  3. LAST NORMAL: "When was the last time you (the patient) were normal (no symptoms)?"     Monday evening 4. PATTERN "Does this come and go, or has it been constant since it started?"  "Is it present now?"     Constant, yes it is present now  5. CARDIAC SYMPTOMS: "Have you had any of the following symptoms: chest pain, difficulty breathing, palpitations?"     No  6. NEUROLOGIC SYMPTOMS: "Have you had any of the following symptoms: headache, dizziness, vision loss, double vision, changes in speech, unsteady on your feet?"     Unsteady on my feet, left leg dragging, dizziness,  7. OTHER SYMPTOMS: "Do you have any other symptoms?"     Weakness in the left hand  Protocols used: Neurologic Deficit-A-AH

## 2023-07-31 ENCOUNTER — Encounter: Payer: Self-pay | Admitting: Internal Medicine

## 2023-07-31 ENCOUNTER — Ambulatory Visit (INDEPENDENT_AMBULATORY_CARE_PROVIDER_SITE_OTHER): Payer: Medicare HMO | Admitting: Internal Medicine

## 2023-07-31 VITALS — BP 154/74 | HR 84 | Ht 65.0 in | Wt 211.0 lb

## 2023-07-31 DIAGNOSIS — I10 Essential (primary) hypertension: Secondary | ICD-10-CM

## 2023-07-31 DIAGNOSIS — E1169 Type 2 diabetes mellitus with other specified complication: Secondary | ICD-10-CM

## 2023-07-31 DIAGNOSIS — R4189 Other symptoms and signs involving cognitive functions and awareness: Secondary | ICD-10-CM | POA: Diagnosis not present

## 2023-07-31 DIAGNOSIS — E785 Hyperlipidemia, unspecified: Secondary | ICD-10-CM

## 2023-07-31 DIAGNOSIS — R29898 Other symptoms and signs involving the musculoskeletal system: Secondary | ICD-10-CM

## 2023-07-31 DIAGNOSIS — Z8673 Personal history of transient ischemic attack (TIA), and cerebral infarction without residual deficits: Secondary | ICD-10-CM

## 2023-07-31 DIAGNOSIS — R42 Dizziness and giddiness: Secondary | ICD-10-CM | POA: Diagnosis not present

## 2023-07-31 MED ORDER — CARVEDILOL 6.25 MG PO TABS
6.2500 mg | ORAL_TABLET | Freq: Two times a day (BID) | ORAL | 1 refills | Status: DC
Start: 2023-07-31 — End: 2023-09-17

## 2023-07-31 MED ORDER — PRAVASTATIN SODIUM 10 MG PO TABS
10.0000 mg | ORAL_TABLET | ORAL | 1 refills | Status: DC
Start: 2023-08-01 — End: 2023-08-28

## 2023-07-31 NOTE — Progress Notes (Signed)
Subjective:    Patient ID: Yvette [name], female    DOB: 1946-05-19, 77 y.o.   MRN: [id number]  HPI  Discussed the use of AI scribe software for clinical note transcription with the patient, who gave verbal consent to proceed.  The patient, with a history of hypertension, hyperlipidemia, and diabetes, presents with a recent episode of left-sided weakness affecting both the arm and leg. The patient describes the sensation as "heavy," but denies any numbness, tingling, or pain. The weakness was first noticed while attempting to climb into bed, resulting in a fall which occurred on Monday. The patient did not lose consciousness, but did hit her head lightly against a door jamb. No headache, dizziness, or vomiting was reported post-fall.  The patient also reports a chronic issue with her back and nerves, but denies any current back pain. She has a history of arthritis, which has previously caused similar leg aches, but never with this level of constancy. The patient has also been experiencing a sensation of "fogginess" in her brain, which started the day of the left-sided weakness and fall, prior to the consultation.  The patient has a history of a right hip fracture, which was discovered during an x-ray for a previous consultation with a neurologist. The patient denies any current pain in the hip. The patient also reports a history of prediabetes, but was surprised to learn that her A1C levels indicate diabetes. The patient was previously on metformin, but stopped taking it after her numbers improved.  The patient's blood pressure was noted to be high during the consultation, which the patient attributes to anxiety when visiting the doctor's office.  She admits that when she checks her blood pressure at home it is also elevated.  The patient's cholesterol levels have also been high in the past, and she has tried several cholesterol medications. However, the patient stopped taking these medications due  to perceived issues with her legs and gait, which did not improve after discontinuing the medication.  The patient is currently taking aspirin, irbesartan, hydrochlorothiazide, and amlodipine. She also takes Lasix as needed for swelling in her legs. The patient has a history of gabapentin use for nerve pain, but stopped taking it due to feeling "loopy." The patient uses a walker for mobility and reports feeling off balance at times.  She has no history of prior stroke or TIA.     Review of Systems  Past Medical History:  Diagnosis Date   Arthropathy, unspecified, site unspecified    Esophageal reflux    Osteoarthritis    Osteoporosis, unspecified    Other abnormal glucose    Other and unspecified hyperlipidemia    Symptomatic menopausal or female climacteric states    Syncope and collapse     Current Outpatient Medications  Medication Sig Dispense Refill   Acetaminophen (TYLENOL 8 HOUR ARTHRITIS PAIN PO) Take by mouth. 2 in am 1 in pm     albuterol (VENTOLIN HFA) 108 (90 Base) MCG/ACT inhaler Inhale 2 puffs into the lungs every 4 (four) hours as needed for wheezing or shortness of breath (cough). 8.5 g 2   amLODipine (NORVASC) 10 MG tablet Take 1 tablet (10 mg total) by mouth daily. 90 tablet 3   ascorbic acid (VITAMIN C) 500 MG tablet Take by mouth.     aspirin 81 MG tablet Take 81 mg by mouth daily.     beta carotene w/minerals (OCUVITE) tablet Take 1 tablet by mouth daily.     Calcium-Phosphorus-Vitamin D (  CITRACAL +D3 PO) Take 1 Dose by mouth 2 (two) times daily. (Patient not taking: Reported on 07/08/2023)     Cetirizine HCl 10 MG CAPS Take by mouth. Alternates allergy pills every couple of months between Zyrtec, Zyzal, Claritin and allegra     Cholecalciferol 10 MCG (400 UNIT) CAPS Take 1 tablet by mouth daily with breakfast.     Co-Enzyme Q-10 100 MG CAPS Take 1 capsule by mouth daily. (Patient not taking: Reported on 07/08/2023)     estradiol (ESTRACE) 0.1 MG/GM vaginal cream  Estrogen Cream Instruction  Discard applicator  Apply pea sized amount to tip of finger to urethra before bed. Wash hands well after application. Use Monday, Wednesday and Friday     fluticasone (FLONASE) 50 MCG/ACT nasal spray Place 2 sprays into both nostrils daily. 16 g 5   furosemide (LASIX) 20 MG tablet Take 1-2 tablets (20-40 mg total) by mouth daily as needed for fluid or edema. Recommend use up to 3-5 days at a time if needed. (Patient not taking: Reported on 07/08/2023) 30 tablet 2   gabapentin (NEURONTIN) 300 MG capsule Take 2 capsules (600 mg total) by mouth at bedtime as needed. 60 capsule 0   hydrochlorothiazide (HYDRODIURIL) 25 MG tablet Take 1 tablet (25 mg total) by mouth daily. 90 tablet 3   irbesartan (AVAPRO) 300 MG tablet Take 1 tablet (300 mg total) by mouth daily. 90 tablet 3   Krill Oil 300 MG CAPS Take by mouth daily.     loratadine (CLARITIN) 10 MG tablet Take 10 mg by mouth daily.     Mag Aspart-Potassium Aspart (POTASSIUM & MAGNESIUM ASPARTAT PO) Take 1 tablet by mouth daily.     Multiple Vitamin (MULTIVITAMIN) tablet Take 1 tablet by mouth daily.     naproxen (NAPROSYN) 500 MG tablet Take 1 tablet (500 mg total) by mouth 2 (two) times daily with a meal. For 1-2 weeks then as needed 60 tablet 2   pantoprazole (PROTONIX) 40 MG tablet Take 1 tablet (40 mg total) by mouth daily before breakfast. 90 tablet 3   Pumpkin Seed-Soy Germ (AZO BLADDER CONTROL/GO-LESS PO) Take by mouth. (Patient not taking: Reported on 07/08/2023)     Vibegron (GEMTESA) 75 MG TABS Take 1 tablet (75 mg total) by mouth daily.     No current facility-administered medications for this visit.    Allergies  Allergen Reactions   Simvastatin Other (See Comments)    Myalgia, muscle aches lower extremity   Erythromycin Nausea And Vomiting    Family History  Problem Relation Age of Onset   Diabetes Mother    Heart disease Mother    Heart attack Mother    Ulcers Father    Gallstones Father     Heart attack Brother     Social History   Socioeconomic History   Marital status: Widowed    Spouse name: Not on file   Number of children: Not on file   Years of education: Not on file   Highest education level: Some college, no degree  Occupational History   Occupation: retired  Tobacco Use   Smoking status: Former    Current packs/day: 0.00    Average packs/day: 1 pack/day for 26.0 years (26.0 ttl pk-yrs)    Types: Cigarettes    Start date: 06/07/1966    Quit date: 06/07/1992    Years since quitting: 31.1    Passive exposure: Past   Smokeless tobacco: Former  Building services engineer status: Never Used  Substance and Sexual Activity   Alcohol use: Not Currently    Alcohol/week: 0.0 standard drinks of alcohol    Comment: occassional   Drug use: No   Sexual activity: Not on file  Other Topics Concern   Not on file  Social History Narrative   Not on file   Social Determinants of Health   Financial Resource Strain: Low Risk  (07/30/2023)   Overall Financial Resource Strain (CARDIA)    Difficulty of Paying Living Expenses: Not hard at all  Food Insecurity: No Food Insecurity (07/30/2023)   Hunger Vital Sign    Worried About Running Out of Food in the Last Year: Never true    Ran Out of Food in the Last Year: Never true  Transportation Needs: No Transportation Needs (07/30/2023)   PRAPARE - Administrator, Civil Service (Medical): No    Lack of Transportation (Non-Medical): No  Physical Activity: Insufficiently Active (07/30/2023)   Exercise Vital Sign    Days of Exercise per Week: 2 days    Minutes of Exercise per Session: 20 min  Stress: No Stress Concern Present (07/30/2023)   [employer] of Occupational Health - Occupational Stress Questionnaire    Feeling of Stress : Not at all  Social Connections: Moderately Isolated (07/30/2023)   Social Connection and Isolation Panel [NHANES]    Frequency of Communication with Friends and Family: More than three  times a week    Frequency of Social Gatherings with Friends and Family: More than three times a week    Attends Religious Services: More than 4 times per year    Active Member of [employer] or Organizations: No    Attends Banker Meetings: Never    Marital Status: Widowed  Intimate Partner Violence: Not At Risk (01/17/2023)   Humiliation, Afraid, Rape, and Kick questionnaire    Fear of Current or Ex-Partner: No    Emotionally Abused: No    Physically Abused: No    Sexually Abused: No     Constitutional: Denies fever, malaise, fatigue, headache or abrupt weight changes.  HEENT: Denies eye pain, eye redness, ear pain, ringing in the ears, wax buildup, runny nose, nasal congestion, bloody nose, or sore throat. Respiratory: Denies difficulty breathing, shortness of breath, cough or sputum production.   Cardiovascular: Patient reports intermittent swelling in legs.  Denies chest pain, chest tightness, palpitations or swelling in the hands.  Gastrointestinal: Denies abdominal pain, bloating, constipation, diarrhea or blood in the stool.  GU: Denies urgency, frequency, pain with urination, burning sensation, blood in urine, odor or discharge. Musculoskeletal: Patient reports intermittent low back pain, intermittent left hip pain, left upper arm weakness and left leg weakness.  Denies decrease in range of motion, muscle pain or joint pain and swelling.  Skin: Denies redness, rashes, lesions or ulcercations.  Neurological: Patient reports brain fog, intermittent dizziness, difficulty with balance.  Denies difficulty with memory, difficulty with speech or problems with coordination.  Psych: Denies anxiety, depression, SI/HI.  No other specific complaints in a complete review of systems (except as listed in HPI above).     Objective:   Physical Exam   BP (!) 154/74   Pulse 84   Ht 5\' 5"  (1.651 m)   Wt 211 lb (95.7 kg)   SpO2 97%   BMI 35.11 kg/m   Wt Readings from Last 3  Encounters:  07/08/23 211 lb (95.7 kg)  05/20/23 212 lb (96.2 kg)  05/13/23 213 lb (96.6 kg)  General: Appears her stated age, obese, in NAD. Skin: She has bruising and an abrasion to her right forearm.  No ulcerations noted. HEENT: Head: normal shape and size; Eyes: sclera white, no icterus, conjunctiva pink, PERRLA and EOMs intact;  Cardiovascular: Normal rate and rhythm. S1,S2 noted.  No murmur, rubs or gallops noted. No JVD or BLE edema. No carotid bruits noted. Pulmonary/Chest: Normal effort and positive vesicular breath sounds. No respiratory distress. No wheezes, rales or ronchi noted.  Musculoskeletal: She has difficulty getting from a sitting to a standing position.  No bony tenderness noted over the lumbar spine.  Trace swelling noted of the left knee.  No pain with palpation of the left knee.  Strength 5/5 BUE.  Handgrips equal.  Strength 4/5 LLE, 5/5 RLE.  Limping gait with use of cane. Neurological: Alert and oriented.  Coordination normal.   BMET    Component Value Date/Time   NA 142 12/25/2022 0803   NA 141 11/20/2011 1950   K 4.8 12/25/2022 0803   K 4.1 11/20/2011 1950   CL 105 12/25/2022 0803   CL 100 11/20/2011 1950   CO2 26 12/25/2022 0803   CO2 30 11/20/2011 1950   GLUCOSE 131 (H) 12/25/2022 0803   GLUCOSE 158 (H) 11/20/2011 1950   BUN 14 12/25/2022 0803   BUN 21 (H) 11/20/2011 1950   CREATININE 0.58 (L) 12/25/2022 0803   CALCIUM 9.2 12/25/2022 0803   CALCIUM 9.4 11/20/2011 1950   GFRNONAA 78 04/11/2021 0829   GFRAA 91 04/11/2021 0829    Lipid Panel     Component Value Date/Time   CHOL 271 (H) 12/25/2022 0803   TRIG 293 (H) 12/25/2022 0803   HDL 51 12/25/2022 0803   CHOLHDL 5.3 (H) 12/25/2022 0803   VLDL 59 (H) 10/16/2016 0001   LDLCALC 170 (H) 12/25/2022 0803    CBC    Component Value Date/Time   WBC 4.2 12/25/2022 0803   RBC 4.43 12/25/2022 0803   HGB 12.6 12/25/2022 0803   HGB 13.6 11/20/2011 1950   HCT 38.1 12/25/2022 0803   HCT 40.3  11/20/2011 1950   PLT 173 12/25/2022 0803   PLT 191 11/20/2011 1950   MCV 86.0 12/25/2022 0803   MCV 90 11/20/2011 1950   MCH 28.4 12/25/2022 0803   MCHC 33.1 12/25/2022 0803   RDW 13.9 12/25/2022 0803   RDW 14.4 11/20/2011 1950   LYMPHSABS 995 12/25/2022 0803   MONOABS 418 10/16/2016 0001   EOSABS 80 12/25/2022 0803   BASOSABS 21 12/25/2022 0803    Hgb A1C Lab Results  Component Value Date   HGBA1C 5.9 (A) 07/08/2023           Assessment & Plan:   Assessment and Plan    Left upper extremity weakness, left leg weakness with concern for possible TIA: Sudden onset of left-sided weakness and heaviness in the arm and leg, with no facial droop, dysarthria, or aphasia. Symptoms have largely resolved. No history of prior stroke or TIA. -Order MRI of the brain to assess for acute stroke or evidence of prior mini-strokes. -Continue daily baby aspirin. -Discussed the importance of good blood pressure, cholesterol and diabetes control, will discuss medication adjustment with PCP and reach out to patient with recommendations.  Hypertension Blood pressure elevated at today's visit, patient has history of inconsistent control. -Continue current antihypertensive medications (Irbesartan, Hydrochlorothiazide, Amlodipine). -Reinforced DASH diet and exercise for weight loss -Discuss potential need for additional antihypertensive medication with primary care provider. After discussion, plan  to add carvedilol 6.25 mg BID   Hyperlipidemia LDL significantly elevated, patient has history of intolerance to multiple statins. -Reinforced low-fat diet -Discuss potential need for additional lipid-lowering medication with primary care provider. After discussion, plan to add pravastatin 10 mg 3 x weekly.  Diabetes Mellitus A1c of 6.7 seven months ago, patient has history of intolerance to Metformin. -Check A1c and fasting glucose today. -Discuss potential need for additional glucose-lowering  medication with primary care provider.  Arthritis Chronic pain and stiffness in the legs, exacerbated by recent fall. -Continue current management strategies. -Consider referral to physical therapy for strengthening and balance exercises.    Follow-up with your PCP as previously scheduled Nicki Reaper, NP

## 2023-08-01 LAB — COMPLETE METABOLIC PANEL WITH GFR
AG Ratio: 1.7 (calc) (ref 1.0–2.5)
ALT: 10 U/L (ref 6–29)
AST: 15 U/L (ref 10–35)
Albumin: 4.4 g/dL (ref 3.6–5.1)
Alkaline phosphatase (APISO): 102 U/L (ref 37–153)
BUN/Creatinine Ratio: 27 (calc) — ABNORMAL HIGH (ref 6–22)
BUN: 16 mg/dL (ref 7–25)
CO2: 29 mmol/L (ref 20–32)
Calcium: 10.2 mg/dL (ref 8.6–10.4)
Chloride: 100 mmol/L (ref 98–110)
Creat: 0.59 mg/dL — ABNORMAL LOW (ref 0.60–1.00)
Globulin: 2.6 g/dL (ref 1.9–3.7)
Glucose, Bld: 153 mg/dL — ABNORMAL HIGH (ref 65–99)
Potassium: 4.1 mmol/L (ref 3.5–5.3)
Sodium: 140 mmol/L (ref 135–146)
Total Bilirubin: 1 mg/dL (ref 0.2–1.2)
Total Protein: 7 g/dL (ref 6.1–8.1)
eGFR: 92 mL/min/{1.73_m2} (ref >=60)

## 2023-08-01 LAB — LIPID PANEL
Cholesterol: 256 mg/dL — ABNORMAL HIGH (ref <200)
HDL: 57 mg/dL (ref >=50)
LDL Cholesterol (Calc): 169 mg/dL — ABNORMAL HIGH
Non-HDL Cholesterol (Calc): 199 mg/dL — ABNORMAL HIGH (ref <130)
Total CHOL/HDL Ratio: 4.5 (calc) (ref <5.0)
Triglycerides: 157 mg/dL — ABNORMAL HIGH (ref <150)

## 2023-08-01 LAB — HEMOGLOBIN A1C
Hgb A1c MFr Bld: 6.4 %{Hb} — ABNORMAL HIGH (ref <5.7)
Mean Plasma Glucose: 137 mg/dL
eAG (mmol/L): 7.6 mmol/L

## 2023-08-07 ENCOUNTER — Ambulatory Visit
Admission: RE | Admit: 2023-08-07 | Discharge: 2023-08-07 | Disposition: A | Discharge status: Home / Self Care | Payer: Medicare HMO | Source: Ambulatory Visit | Attending: Internal Medicine | Admitting: Internal Medicine

## 2023-08-07 DIAGNOSIS — E1169 Type 2 diabetes mellitus with other specified complication: Secondary | ICD-10-CM | POA: Diagnosis not present

## 2023-08-07 DIAGNOSIS — R29898 Other symptoms and signs involving the musculoskeletal system: Secondary | ICD-10-CM | POA: Diagnosis not present

## 2023-08-07 DIAGNOSIS — I6782 Cerebral ischemia: Secondary | ICD-10-CM | POA: Diagnosis not present

## 2023-08-07 DIAGNOSIS — R4189 Other symptoms and signs involving cognitive functions and awareness: Secondary | ICD-10-CM

## 2023-08-07 DIAGNOSIS — R42 Dizziness and giddiness: Secondary | ICD-10-CM | POA: Diagnosis not present

## 2023-08-07 DIAGNOSIS — E119 Type 2 diabetes mellitus without complications: Secondary | ICD-10-CM | POA: Diagnosis not present

## 2023-08-07 DIAGNOSIS — I639 Cerebral infarction, unspecified: Secondary | ICD-10-CM | POA: Diagnosis not present

## 2023-08-12 ENCOUNTER — Telehealth: Payer: Self-pay | Admitting: Family Medicine

## 2023-08-12 NOTE — Telephone Encounter (Signed)
She was seen by Yvette Reaper, FNP on 07/31/23. I have reviewed her note, and I did talk to Yvette [name] last week about this.  Unfortunately there is not much else we can do in our office as we are waiting on the results of MRI.  The main thing from her visit with Yvette [name] was that it sounded like there was concern for possible TIA concerns.  She was advised to seek care at hospital ED if worsening or new concerning symptoms.  All I can do is call the Radiology department this evening and ask for status of results. But She may still have to wait until tomorrow for results best case scenario.  If they are worried they may need to go to ED for evaluation.  This is a hard decision to make. I am not sure how I can explain the repeating.  Yvette Pilar, DO [facility] 08/12/2023, 3:57 PM

## 2023-08-12 NOTE — Telephone Encounter (Signed)
I spoke to [place] and relayed that we have not yet received results for the MRI on 08/07/23. We will contact family as soon as we receive them. She states that she and her sister are both concerned as Yvette [name] has begun repeating herself a lot more than usual. She is now repeating things in as short time period of 15 minutes. I let Neysa Bonito know that I would advise PCP, and we will be in touch as soon as we have results. She voiced understanding.

## 2023-08-12 NOTE — Telephone Encounter (Signed)
Patient's daughter, Neysa Bonito, has called to check on the imaging results from an MRI that patient had done on 08/07/2023.  Please contact Christy back @ # [phone].

## 2023-08-14 ENCOUNTER — Encounter: Payer: Self-pay | Admitting: Family Medicine

## 2023-08-14 ENCOUNTER — Encounter: Payer: Self-pay | Admitting: Internal Medicine

## 2023-08-27 ENCOUNTER — Encounter: Payer: Self-pay | Admitting: Internal Medicine

## 2023-08-27 DIAGNOSIS — R29898 Other symptoms and signs involving the musculoskeletal system: Secondary | ICD-10-CM

## 2023-08-27 DIAGNOSIS — I6930 Unspecified sequelae of cerebral infarction: Secondary | ICD-10-CM

## 2023-08-28 ENCOUNTER — Ambulatory Visit: Payer: Self-pay | Admitting: *Deleted

## 2023-08-28 DIAGNOSIS — I6930 Unspecified sequelae of cerebral infarction: Secondary | ICD-10-CM | POA: Insufficient documentation

## 2023-08-28 MED ORDER — REPATHA SURECLICK 140 MG/ML ~~LOC~~ SOAJ: 140.0000 mg | SUBCUTANEOUS | 0 refills | Status: DC

## 2023-08-28 NOTE — Telephone Encounter (Signed)
Message from [place] E sent at 08/28/2023 10:25 AM EST  Summary: Questions for clinic   Pt's daughter called expressing concerns about the patient's health and symptoms. Wants to discuss with a nurse/PCP  Best contact: [phone]          Call History  Contact Date/Time Type Contact Phone/Fax User  08/28/2023 10:24 AM EST Phone (Incoming) Thaxton,Christy (Emergency Contact) [phone] Rexene Edison) Randol Kern   Reason for Disposition  [1] Follow-up call to recent contact AND [2] information only call, no triage required    Daughter returning call for MRI results where Rene Kocher had tried to call this morning to give them the MRI results.  Answer Assessment - Initial Assessment Questions 1. REASON FOR CALL or QUESTION: "What is your reason for calling today?" or "How can I best help you?" or "What question do you have that I can help answer?"     Daughter Dorethea Clan calling back in for MRI results.   Nicki Reaper, NP had tried calling her this morning to go over her results. Message sent to practice for [facility].  Protocols used: Information Only Call - No Triage-A-AH

## 2023-08-28 NOTE — Telephone Encounter (Signed)
I am waiting to discuss with Dr. Kirtland Bouchard and then will follow back up with patient and daughter

## 2023-08-28 NOTE — Addendum Note (Signed)
Addended by: Lorre Munroe on: 08/28/2023 09:11 AM   Modules accepted: Orders

## 2023-08-28 NOTE — Telephone Encounter (Signed)
  Chief Complaint: Daughter Dorethea Clan returning call to Nicki Reaper, NP regarding her mother's MRI results.   Rene Kocher had tried to call her earlier today to go over those results. Symptoms: N/A Frequency: N/A Pertinent Negatives: Patient denies N/A Disposition: [] ED /[] Urgent Care (no appt availability in office) / [] Appointment(In office/virtual)/ []  [place] [facility]/ [] Home Care/ [] Refused Recommended Disposition /[] [place] [facility]/ [x]  Follow-up with PCP Additional Notes: Message sent to Nicki Reaper, NP that daughter had called back in for the MRI results.   Please give her a call back [phone].

## 2023-08-29 ENCOUNTER — Other Ambulatory Visit: Payer: Self-pay | Admitting: Family Medicine

## 2023-08-29 ENCOUNTER — Other Ambulatory Visit: Payer: Self-pay | Admitting: Neurology

## 2023-08-29 DIAGNOSIS — G8929 Other chronic pain: Secondary | ICD-10-CM

## 2023-08-29 DIAGNOSIS — M47816 Spondylosis without myelopathy or radiculopathy, lumbar region: Secondary | ICD-10-CM

## 2023-09-01 ENCOUNTER — Encounter: Payer: Self-pay | Admitting: Internal Medicine

## 2023-09-01 ENCOUNTER — Telehealth: Payer: Self-pay

## 2023-09-01 NOTE — Telephone Encounter (Signed)
Yvette [name] (Key: [id number]) PA Case ID #: [id number] Rx #: [id number] Need Help? Call us at [phone] Outcome Additional Information Required There is an existing case within the [facility] environment that has the same patient, prescriber, and drug. This case must be finalized before proceeding with similar requests. Drug Repatha SureClick 140MG /ML auto-injectors ePA cloud Engineer, manufacturing systems Electronic PA Form Original Claim Info [phone] &~~~~~~~~~<<~~~~~~~~~~~~~

## 2023-09-01 NOTE — Telephone Encounter (Signed)
Requested Prescriptions  Pending Prescriptions Disp Refills   naproxen (NAPROSYN) 500 MG tablet [Pharmacy Med Name: NAPROXEN 500 MG TABLET] 60 tablet 0    Sig: Take 1 tablet (500 mg total) by mouth 2 (two) times daily with a meal. For 1-2 weeks then as needed     Analgesics:  NSAIDS Failed - 08/29/2023  1:42 PM      Failed - Manual Review: Labs are only required if the patient has taken medication for more than 8 weeks.      Failed - Cr in normal range and within 360 days    Creat  Date Value Ref Range Status  07/31/2023 0.59 (L) 0.60 - 1.00 mg/dL Final   Creatinine, Urine  Date Value Ref Range Status  07/08/2023 166 20 - 275 mg/dL Final         Passed - HGB in normal range and within 360 days    Hemoglobin  Date Value Ref Range Status  12/25/2022 12.6 11.7 - 15.5 g/dL Final   HGB  Date Value Ref Range Status  11/20/2011 13.6 12.0 - 16.0 g/dL Final         Passed - PLT in normal range and within 360 days    Platelets  Date Value Ref Range Status  12/25/2022 173 140 - 400 Thousand/uL Final   Platelet  Date Value Ref Range Status  11/20/2011 191 150 - 440 x10 3/mm 3 Final         Passed - HCT in normal range and within 360 days    HCT  Date Value Ref Range Status  12/25/2022 38.1 35.0 - 45.0 % Final  11/20/2011 40.3 35.0 - 47.0 % Final         Passed - eGFR is 30 or above and within 360 days    GFR, Est African American  Date Value Ref Range Status  04/11/2021 91 > OR = 60 mL/min/1.37m2 Final   GFR, Est Non African American  Date Value Ref Range Status  04/11/2021 78 > OR = 60 mL/min/1.18m2 Final   eGFR  Date Value Ref Range Status  07/31/2023 92 > OR = 60 mL/min/1.90m2 Final         Passed - Patient is not pregnant      Passed - Valid encounter within last 12 months    Recent Outpatient Visits           1 month ago Weakness of left upper extremity   [place] [facility] [place], [place] W, NP   1 month ago Type 2 diabetes mellitus  with other specified complication, without long-term current use of insulin [facility])   [facility] [place], Netta Neat, DO   3 months ago Urinary frequency   [place] [facility] [place], Salvadore Oxford, NP   5 months ago Urinary frequency   [place] [facility] [place], Salvadore Oxford, NP   8 months ago Annual physical exam   [place] [place] [facility] Smitty Cords, DO       Future Appointments             In 2 weeks Althea Charon, Netta Neat, DO [facility]   In 1 month [place], Elana Alm [facility] [place]   In 4 months Althea Charon, Netta Neat, DO [place] [facility], [facility]

## 2023-09-02 NOTE — Telephone Encounter (Signed)
09/01/2023 - [employer] approved coverage of Repatha, good until 10/06/2024. I notified patient.

## 2023-09-03 ENCOUNTER — Other Ambulatory Visit: Payer: Self-pay | Admitting: Neurology

## 2023-09-08 ENCOUNTER — Ambulatory Visit: Payer: Medicare HMO | Attending: Internal Medicine

## 2023-09-08 DIAGNOSIS — R29898 Other symptoms and signs involving the musculoskeletal system: Secondary | ICD-10-CM | POA: Diagnosis not present

## 2023-09-08 DIAGNOSIS — M6281 Muscle weakness (generalized): Secondary | ICD-10-CM | POA: Insufficient documentation

## 2023-09-08 DIAGNOSIS — I6930 Unspecified sequelae of cerebral infarction: Secondary | ICD-10-CM | POA: Insufficient documentation

## 2023-09-08 NOTE — Therapy (Signed)
OUTPATIENT PHYSICAL THERAPY NEURO EVALUATION   Patient Name: Yvette [name] MRN: [id number] DOB:[birth year], 77 y.o., female Today's Date: 09/16/2023   PCP: Dr. Althea Charon, DO REFERRING PROVIDER: Nicki Reaper, NP  END OF SESSION:  PT End of Session - 09/16/23 1656     Visit Number 1    Number of Visits 13    Date for PT Re-Evaluation 10/28/23    Authorization Type 2x/week x 6 weeks (10/27/22); Medicare PN at visit #10    PT Start Time 1415    PT Stop Time 1500    PT Time Calculation (min) 45 min    Activity Tolerance Patient tolerated treatment well    Behavior During Therapy WFL for tasks assessed/performed             Past Medical History:  Diagnosis Date   Arthropathy, unspecified, site unspecified    Esophageal reflux    Osteoarthritis    Osteoporosis, unspecified    Other abnormal glucose    Other and unspecified hyperlipidemia    Symptomatic menopausal or female climacteric states    Syncope and collapse    Past Surgical History:  Procedure Laterality Date   ABDOMINAL HYSTERECTOMY     APPENDECTOMY     BREAST BIOPSY Left 1970's   neg   COLONOSCOPY     CYST REMOVAL TRUNK  11/24/2020   from back   FINGER FRACTURE SURGERY     Patient Active Problem List   Diagnosis Date Noted   History of cerebrovascular accident (CVA) with residual deficit 08/28/2023   Left hip pain 06/19/2022   Lumbar radiculopathy 04/02/2022   Gait abnormality 04/02/2022   Urinary urgency 04/02/2022   Familial hyperlipidemia 12/07/2020   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   Abscess 04/07/2020   Venous insufficiency of both lower extremities 12/01/2019   Suspected sleep apnea 07/13/2018   Former smoker 07/13/2018   Multiple renal cysts 11/12/2017   Vitamin D deficiency 10/15/2017   Spondylosis of lumbar region without myelopathy or radiculopathy 06/10/2017   Chronic right-sided low back pain with sciatica 06/10/2017   Primary osteoarthritis involving multiple joints 06/10/2017    BPPV (benign paroxysmal positional vertigo), right 06/10/2017   Nausea without vomiting 06/10/2017   Environmental and seasonal allergies 02/12/2017   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/2017   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   Dupuytren's contracture of left hand 10/14/2016   Anxiety disorder 09/03/2016   GERD (gastroesophageal reflux disease) 09/03/2016   Prophylactic use of raloxifene (Evista) 09/03/2016   Hyperlipidemia associated with type 2 diabetes mellitus (HCC) 09/03/2016   Type 2 diabetes mellitus with other specified complication (HCC) 09/12/2015   Dyspnea 07/23/2013   Essential hypertension     ONSET DATE: October 2024  REFERRING DIAG: R29.898 (ICD-10-CM) - Bilateral leg weakness   I69.30 (ICD-10-CM) - History of cerebrovascular accident (CVA) with residual deficit   THERAPY DIAG:  Weakness of both lower extremities  Muscle weakness (generalized)  History of cerebrovascular accident (CVA) with residual deficit  Rationale for Evaluation and Treatment: Rehabilitation  SUBJECTIVE:  SUBJECTIVE STATEMENT: Pt reports at end of October, 2024 (does not recall exact day) she was getting into bed and she felt like she stepped up on the step stool to get into bed and when she turned around she felt off balance and fell onto the floor. She reports she had difficulty moving her L LE and it felt a little weaker.  She reports she did not injure anything.  She crawled to her cedar chest and used that to push herself up from and used her R leg to stand up.  She reports she did not call any family or neighbors and she slept in the recliner chair that day.    She called her PCP the next week (few days later) to check in.  She was evaluated by PCP and referred to PT. Pt states she was evaluated and referred to PT  because the "episode" was a stroke.  She has not seen any other providers for this.  Current function: intermittently she feels like she is "dragging her L leg" since this incident.  Her L foot has caught a few times but she has not fallen since then.    She is now amb with SPC, she reports she did not use the [facility] prior to this "episode".  She feels like it is helpful when she gets tired.    Pt accompanied by: self; she states she has family that live close by (daughter)  PERTINENT HISTORY: pt reports she has h/o LBP (DJD)  and difficulty with prolonged standing/walking  intermittently; osteopenia; and h/o stable L hip fx; unclear which hip though- upon chart review the xray results from 03/2022 states stable R hip fx on impression; pt states she has seen orthopedist for this and opted to not pursue surgery.  PAIN:  Are you having pain? Not today  PRECAUTIONS: Fall  RED FLAGS: None   WEIGHT BEARING RESTRICTIONS: No  FALLS: Has patient fallen in last 6 months? Yes, 1x (see above)  LIVING ENVIRONMENT: Lives with: lives with their family and lives alone, pt is a widow; daughter "Arline Asp" lives 4 miles; has a Nurse, mental health at home Lives in: [employer]: 2 steps to enter home on deck, with railings on back of home; has 5 steps in front of home with railing  Has following equipment at home: Single point cane  PLOF: Independent  PATIENT GOALS: to be able to walk longer and with improved balance inside and outside of her home  OBJECTIVE:  Note: Objective measures were completed at Evaluation unless otherwise noted.  DIAGNOSTIC FINDINGS: per chart review: brain MRI on 08/07/23: FINDINGS: Brain: There is a 1.3 cm acute infarct in the right corona radiata. Chronic infarcts are noted in the left corona radiata, right basal ganglia, and both thalami. Small T2 hyperintensities elsewhere in the cerebral white matter bilaterally are nonspecific but compatible with mild chronic small vessel  ischemic disease. There is a single chronic microhemorrhage in the right cerebellar hemisphere. No mass, midline shift, or extra-axial fluid collection is identified. There is mild generalized cerebral atrophy. IMPRESSION: 1. Acute infarct in the right corona radiata. 2. 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    COGNITION: Overall cognitive status: Within functional limits for tasks assessed   SENSATION: WFL  COORDINATION: Pt demonstrated normal coordination with finger to nose with PT and LE heel/shin motion  POSTURE: rounded shoulders  LOWER EXTREMITY ROM:     Active  Right Eval Left Eval  Hip flexion [facility]  for sit to stand and amb [facility] for sit to stand and amb  Hip extension    Hip abduction    Hip adduction    Hip internal rotation    Hip external rotation    Knee flexion    Knee extension    Ankle dorsiflexion    Ankle plantarflexion    Ankle inversion    Ankle eversion     (Blank rows = not tested)  LOWER EXTREMITY MMT:    MMT Right Eval Left Eval  Hip flexion 5 4  Hip extension    Hip abduction 4 4  Hip adduction    Hip internal rotation 4+ 4  Hip external rotation 4+ 4  Knee flexion 5 4  Knee extension 5 4  Ankle dorsiflexion 5 4  Ankle plantarflexion    Ankle inversion    Ankle eversion    (Blank rows = not tested)  BED MOBILITY:  Will be assessed at future visit  TRANSFERS: Assistive device utilized: SPC during amb Sit to stand: Complete Independence Stand to sit: Complete Independence Chair to chair: able to without SPC, uses b/l UE support Floor: deferred today  STAIRS: Will assess at future visit  GAIT: Gait pattern: wide BOS and poor foot clearance- Left Distance walked: pt amb in/out of clinic independently Assistive device utilized: Single point cane Level of assistance: Complete Independence Comments: pt amb with decreased gait speed; states she feels fatigued more easily during amb than  prior to October  FUNCTIONAL TESTS:  5 times sit to stand: 26 seconds (indicating increased fall risk); pt uses UE support Additional balance testing will be administered at future visit  PATIENT SURVEYS:  FOTO will be administered at future visit; clinic lost electricity/internet during pt's appointment today  TODAY'S TREATMENT:                                                                                                                              DATE: 09/08/23   Initial evaluation performed today  PATIENT EDUCATION: Education details: PT POC/goals, purpose of PT to address strength/balance/gait impairments and to reduce fall risk/improve safety during amb and ADLs at home Person educated: Patient Education method: Explanation Education comprehension: verbalized understanding  HOME EXERCISE PROGRAM: Will be initiated at future visit; clinic lost electricity and internet during today's session  GOALS: Goals reviewed with patient? Yes  SHORT TERM GOALS: Target date: 10/07/23  Pt will be instructed on strategies to improve home enrivonment safety during amb at home to promote safe independent living with reduced risk for falls (lighting, throw rugs, obstacles, furniture placement) Baseline: initiated during evaluation today Goal status: INITIAL   LONG TERM GOALS: Target date: 10/27/22  Administer FOTO at next visit and set appropriate FOTO goal Baseline: will be set at visit #2 Goal status: INITIAL  2.  Improve L LE strength 1/2 MMT grade to promote improved ability to amb without SPC at [facility] for short distances around her  home Baseline: using SPC for amb at self selected height, has not been properly fitted yet; PT adjusted height of SPC to appropriate level today at end of evaluation appointment Goal status: INITIAL  3.  Pt will be able to peform 5x STS in <15 seconds indicating reduced risk for falling Baseline: 26 seconds Goal status:  INITIAL    ASSESSMENT:  CLINICAL IMPRESSION: Patient is a 77 y.o. F who was seen today for physical therapy evaluation and treatment for LE weakness after CVA in October 2024.   OBJECTIVE IMPAIRMENTS: Abnormal gait, decreased activity tolerance, decreased balance, decreased endurance, decreased mobility, difficulty walking, decreased strength, decreased safety awareness, and impaired perceived functional ability.   ACTIVITY LIMITATIONS: carrying, lifting, standing, squatting, stairs, and locomotion level  PARTICIPATION LIMITATIONS: meal prep, cleaning, laundry, interpersonal relationship, shopping, and community activity  PERSONAL FACTORS: Time since onset of injury/illness/exacerbation and 1-2 comorbidities: h/o lumbar DJD and possible h/o hip fx (unclear from pt report today)  are also affecting patient's functional outcome.   REHAB POTENTIAL: Good  CLINICAL DECISION MAKING: Stable/uncomplicated  EVALUATION COMPLEXITY: Low  PLAN:  PT FREQUENCY: 2x/week  PT DURATION: 6 weeks  PLANNED INTERVENTIONS: 97110-Therapeutic exercises, 97530- Therapeutic activity, [id number]- Neuromuscular re-education, 97535- Self Care, and 16109- Manual therapy  PLAN FOR NEXT SESSION: LE strengthening; further assessment of balance/walking endurance and transfers; 6 min walk test; BERG   Max Fickle, PT, DPT, OCS Ardine Bjork, PT 09/16/2023, 4:59 PM

## 2023-09-10 ENCOUNTER — Ambulatory Visit: Payer: Medicare HMO

## 2023-09-10 DIAGNOSIS — R29898 Other symptoms and signs involving the musculoskeletal system: Secondary | ICD-10-CM | POA: Diagnosis not present

## 2023-09-10 DIAGNOSIS — M6281 Muscle weakness (generalized): Secondary | ICD-10-CM | POA: Diagnosis not present

## 2023-09-10 DIAGNOSIS — I6930 Unspecified sequelae of cerebral infarction: Secondary | ICD-10-CM

## 2023-09-15 ENCOUNTER — Ambulatory Visit: Payer: Medicare HMO

## 2023-09-15 ENCOUNTER — Encounter: Payer: Self-pay | Admitting: Family Medicine

## 2023-09-17 ENCOUNTER — Telehealth: Payer: Self-pay | Admitting: Neurology

## 2023-09-17 ENCOUNTER — Ambulatory Visit (INDEPENDENT_AMBULATORY_CARE_PROVIDER_SITE_OTHER): Payer: Medicare HMO | Admitting: Family Medicine

## 2023-09-17 ENCOUNTER — Encounter: Payer: Self-pay | Admitting: Family Medicine

## 2023-09-17 VITALS — BP 152/78 | HR 68 | Ht 65.0 in | Wt 212.0 lb

## 2023-09-17 DIAGNOSIS — H1032 Unspecified acute conjunctivitis, left eye: Secondary | ICD-10-CM

## 2023-09-17 DIAGNOSIS — I6930 Unspecified sequelae of cerebral infarction: Secondary | ICD-10-CM | POA: Diagnosis not present

## 2023-09-17 DIAGNOSIS — I10 Essential (primary) hypertension: Secondary | ICD-10-CM | POA: Diagnosis not present

## 2023-09-17 MED ORDER — REPATHA SURECLICK 140 MG/ML ~~LOC~~ SOAJ: 140.0000 mg | SUBCUTANEOUS | 3 refills | Status: AC

## 2023-09-17 MED ORDER — CARVEDILOL 12.5 MG PO TABS: 12.5000 mg | ORAL_TABLET | Freq: Two times a day (BID) | ORAL | 1 refills | Status: DC

## 2023-09-17 MED ORDER — POLYMYXIN B-TRIMETHOPRIM 10000-0.1 UNIT/ML-% OP SOLN: 1.0000 [drp] | Freq: Four times a day (QID) | OPHTHALMIC | 0 refills | Status: DC

## 2023-09-17 NOTE — Patient Instructions (Addendum)
Thank you for coming to the office today.  Carvedilol increased from 6.25 up to 12.5mg  twice, double your current low dose pill, 2 in AM 2 in PM New order sent.  Keep on Repatha, added refills.  Use antibiotic eye drops, if not improving let me know. Or go to the eye doctor  Please schedule a Follow-up Appointment to: Return if symptoms worsen or fail to improve.  If you have any other questions or concerns, please feel free to call the office or send a message through MyChart. You may also schedule an earlier appointment if necessary.  Additionally, you may be receiving a survey about your experience at our office within a few days to 1 week by e-mail or mail. We value your feedback.  Saralyn Pilar, DO [facility], [place]

## 2023-09-17 NOTE — Progress Notes (Signed)
Subjective:    Patient ID: Yvette [name], female    DOB: [birth year], 77 y.o.   MRN: [id number]  Yvette [name] is a 77 y.o. female presenting on 09/17/2023 for Medical Management of Chronic Issues (Follow up to check medication/Also eye discharge left )   HPI  Discussed the use of AI scribe software for clinical note transcription with the patient, who gave verbal consent to proceed.   Left Eye Conjunctivitis Sister in law visited over Thanksgiving holiday. She developed bacterial pink eye. Everything was cleaned but [name] still experienced L eye redness drainage and concern pink eye Not painful, just itching irritated, draining  Type 2 Diabetes / Hyperglycemia / Morbid Obesity BMI >35 She has done well overall.  She has continued to improve her diet, reduce portions Off Metformin Last A1c 6.4 Currently on ACEi, ASA 81, statin Lifestyle: - Diet goal to improve diet still. She does admit some candy increased at times. - Exercise: Improved activity with some wt loss Admits occasional tingling in feet. Denies hypoglycemia, visual changes, numbness .  Hypertension Elevated BP She does Physical Therapy PT, she will often have higher BP initially for PT and during exercise and relaxed she has improved BP On medications currently recently added Carvedilol 6.25mg  TWICE A DAY, tolerating well Improved BP but not at goal   HYPERLIPIDEMIA / Familial Hyperlipidemia History of CVA  Last Lipid 07/2023, LDL 169 Has failed Statin therapy, Zetia, Rosuvastatin She has famlilal cholesterol history and hyperTG Recently restarted Repatha q 2 week pen injection. Tolerating well.   Lumbar Radiculopathy Osteoarthritis hips Followed by Ortho and Neurology She had nerve testing by Dr Terrace Arabia, no surgery needed. Dx DDD spine  Unlikely to have spine surgery in future She is improving her ambulation and her nerve strength and function On Gabapentin per Neuro Has upcoming apt Neuro        09/17/2023   10:52 AM 01/17/2023    8:22 AM 01/01/2023   11:10 AM  Depression screen PHQ 2/9  Decreased Interest 0 0 0  Down, Depressed, Hopeless 0 0 0  PHQ - 2 Score 0 0 0  Altered sleeping  0 0  Tired, decreased energy  0 1  Change in appetite  0 1  Feeling bad or failure about yourself   0 0  Trouble concentrating  0 0  Moving slowly or fidgety/restless  0 0  Suicidal thoughts  0 0  PHQ-9 Score  0 2  Difficult doing work/chores  Not difficult at all Not difficult at all       09/17/2023   10:52 AM 01/01/2023   11:10 AM 07/03/2022   11:13 AM 02/14/2022   10:38 AM  GAD 7 : Generalized Anxiety Score  Nervous, Anxious, on Edge 0 0 0 0  Control/stop worrying 0 0 0 0  Worry too much - different things 0 0 0 0  Trouble relaxing 0 0 0 0  Restless 0 0 0 0  Easily annoyed or irritable 0 0 0 0  Afraid - awful might happen 0 0 0 0  Total GAD 7 Score 0 0 0 0  Anxiety Difficulty  Not difficult at all Not difficult at all Not difficult at all    Social History   Tobacco Use   Smoking status: Former    Current packs/day: 0.00    Average packs/day: 1 pack/day for 26.0 years (26.0 ttl pk-yrs)    Types: Cigarettes    Start date: 06/07/1966  Quit date: 06/07/1992    Years since quitting: 31.2    Passive exposure: Past   Smokeless tobacco: Former  Building services engineer status: Never Used  Substance Use Topics   Alcohol use: Not Currently    Alcohol/week: 0.0 standard drinks of alcohol    Comment: occassional   Drug use: No    Review of Systems Per HPI unless specifically indicated above     Objective:    BP (!) 152/78   Pulse 68   Ht 5\' 5"  (1.651 m)   Wt 212 lb (96.2 kg)   BMI 35.28 kg/m   Wt Readings from Last 3 Encounters:  09/17/23 212 lb (96.2 kg)  07/31/23 211 lb (95.7 kg)  07/08/23 211 lb (95.7 kg)    Physical Exam Vitals and nursing note reviewed.  Constitutional:      General: She is not in acute distress.    Appearance: She is well-developed. She is not  diaphoretic.     Comments: Well-appearing, comfortable, cooperative  HENT:     Head: Normocephalic and atraumatic.  Eyes:     General:        Right eye: No discharge.        Left eye: Discharge present.    Comments: Left conjunctival injection  Neck:     Thyroid: No thyromegaly.  Cardiovascular:     Rate and Rhythm: Normal rate and regular rhythm.     Heart sounds: Normal heart sounds. No murmur heard. Pulmonary:     Effort: Pulmonary effort is normal. No respiratory distress.     Breath sounds: Normal breath sounds. No wheezing or rales.  Musculoskeletal:        General: Normal range of motion.     Cervical back: Normal range of motion and neck supple.  Lymphadenopathy:     Cervical: No cervical adenopathy.  Skin:    General: Skin is warm and dry.     Findings: No erythema or rash.  Neurological:     Mental Status: She is alert and oriented to person, place, and time.  Psychiatric:        Behavior: Behavior normal.     Comments: Well groomed, good eye contact, normal speech and thoughts     Results for orders placed or performed in visit on 07/31/23  COMPLETE METABOLIC PANEL WITH GFR  Result Value Ref Range   Glucose, Bld 153 (H) 65 - 99 mg/dL   BUN 16 7 - 25 mg/dL   Creat 1.61 (L) 0.96 - 1.00 mg/dL   eGFR 92 > OR = 60 EA/VWU/9.81X9   BUN/Creatinine Ratio 27 (H) 6 - 22 (calc)   Sodium 140 135 - 146 mmol/L   Potassium 4.1 3.5 - 5.3 mmol/L   Chloride 100 98 - 110 mmol/L   CO2 29 20 - 32 mmol/L   Calcium 10.2 8.6 - 10.4 mg/dL   Total Protein 7.0 6.1 - 8.1 g/dL   Albumin 4.4 3.6 - 5.1 g/dL   Globulin 2.6 1.9 - 3.7 g/dL (calc)   AG Ratio 1.7 1.0 - 2.5 (calc)   Total Bilirubin 1.0 0.2 - 1.2 mg/dL   Alkaline phosphatase (APISO) 102 37 - 153 U/L   AST 15 10 - 35 U/L   ALT 10 6 - 29 U/L  Lipid panel  Result Value Ref Range   Cholesterol 256 (H) <200 mg/dL   HDL 57 > OR = 50 mg/dL   Triglycerides 147 (H) <150 mg/dL   LDL Cholesterol (Calc)  169 (H) mg/dL (calc)    Total CHOL/HDL Ratio 4.5 <5.0 (calc)   Non-HDL Cholesterol (Calc) 199 (H) <130 mg/dL (calc)  Hemoglobin G4W  Result Value Ref Range   Hgb A1c MFr Bld 6.4 (H) <5.7 % of total Hgb   Mean Plasma Glucose 137 mg/dL   eAG (mmol/L) 7.6 mmol/L      Assessment & Plan:   Problem List Items Addressed This Visit     Essential hypertension - Primary   Relevant Medications   carvedilol (COREG) 12.5 MG tablet   REPATHA SURECLICK 140 MG/ML SOAJ   History of cerebrovascular accident (CVA) with residual deficit   Relevant Medications   REPATHA SURECLICK 140 MG/ML SOAJ   Other Visit Diagnoses     Acute bacterial conjunctivitis of left eye       Relevant Medications   trimethoprim-polymyxin b (POLYTRIM) ophthalmic solution        L Eye, isolated conjunctivitis, suspected bacterial Left eye redness, tenderness, and matting suggestive of bacterial conjunctivitis. No vision loss or severe pain. -Start Polytrim drops, one drop every 4-6 hours in the left eye for 7-10 days. -If no improvement, patient to contact eye doctor.  Hypertension Persistent elevated blood pressure despite being on three antihypertensive medications. Some improvement with recent addition of Carvedilol. -Increase Carvedilol from 6.25mg  to 12.5mg  twice daily. -Continue Amlodipine 10mg  daily, Irbesartan 300mg  daily, hydrochlorothiazide 25mg  -Check blood pressure regularly and return sooner if not controlled. -Consider switch hydrochlorothiazide to Chlorthalidone 25mg  if needed inc potency, defer today due to already urinary frequency.  Hyperlipidemia History of CVA On Repatha with good tolerance. -Continue Repatha as prescribed. Q 2 week pen injection, re ordered. -Check lipid panel at next visit in April. 2025  URI / Sinusitis Complaints of cough and sinus drainage, possibly related to recent exposure to sister-in-law with similar symptoms. Lungs clear on examination. -Over-the-counter Mucinex or Coricidin recommended  for symptom relief. -Return sooner if symptoms worsen or do not improve.        No orders of the defined types were placed in this encounter.   Meds ordered this encounter  Medications   trimethoprim-polymyxin b (POLYTRIM) ophthalmic solution    Sig: Place 1 drop into the left eye every 6 (six) hours. Or 4 drops per day. For 7-10 days for conjunctivitis    Dispense:  10 mL    Refill:  0   carvedilol (COREG) 12.5 MG tablet    Sig: Take 1 tablet (12.5 mg total) by mouth 2 (two) times daily with a meal.    Dispense:  180 tablet    Refill:  1    Patient will finish current bottle double dose, then pick up this new rx when ready.   REPATHA SURECLICK 140 MG/ML SOAJ    Sig: Inject 140 mg into the skin every 14 (fourteen) days.    Dispense:  6 mL    Refill:  3    Add future refills    Follow up plan: Return if symptoms worsen or fail to improve.   Saralyn Pilar, DO [facility] 09/17/2023, 11:28 AM

## 2023-09-17 NOTE — Telephone Encounter (Signed)
Pt called to confirm upcoming appt time

## 2023-09-18 ENCOUNTER — Other Ambulatory Visit: Payer: Self-pay

## 2023-09-18 ENCOUNTER — Encounter: Payer: Self-pay | Admitting: Neurology

## 2023-09-18 ENCOUNTER — Emergency Department
Admission: EM | Admit: 2023-09-18 | Discharge: 2023-09-18 | Disposition: A | Discharge status: Home / Self Care | Payer: Medicare HMO | Attending: Emergency Medicine | Admitting: Emergency Medicine

## 2023-09-18 ENCOUNTER — Emergency Department: Payer: Medicare HMO

## 2023-09-18 ENCOUNTER — Encounter: Payer: Self-pay | Admitting: Family Medicine

## 2023-09-18 DIAGNOSIS — R0789 Other chest pain: Secondary | ICD-10-CM | POA: Diagnosis not present

## 2023-09-18 DIAGNOSIS — E119 Type 2 diabetes mellitus without complications: Secondary | ICD-10-CM | POA: Diagnosis not present

## 2023-09-18 DIAGNOSIS — Y92410 Unspecified street and highway as the place of occurrence of the external cause: Secondary | ICD-10-CM | POA: Insufficient documentation

## 2023-09-18 DIAGNOSIS — I10 Essential (primary) hypertension: Secondary | ICD-10-CM | POA: Insufficient documentation

## 2023-09-18 NOTE — Discharge Instructions (Signed)
Your chest xray today was okay. You can take tylenol 1000mg  every 8 hours as needed for pain. Continue taking your blood pressure medicines as prescribed.
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
## 2023-09-19 ENCOUNTER — Institutional Professional Consult (permissible substitution): Payer: Medicare HMO | Admitting: Neurology

## 2023-09-19 NOTE — Therapy (Signed)
OUTPATIENT PHYSICAL THERAPY NEURO TREATMENT   Patient Name: Yvette [name] MRN: [id number] DOB:June 26, 1946, 77 y.o., female Today's Date: 09/19/2023   PCP: Dr. Althea Charon, DO REFERRING PROVIDER: Nicki Reaper, NP  END OF SESSION:09/10/23  PT End of Session - 09/19/23 1220     Visit Number 2    Number of Visits 13    Date for PT Re-Evaluation 10/28/23    Authorization Type 2x/week x 6 weeks (10/27/22); Medicare PN at visit #10    PT Start Time 1415    PT Stop Time 1500    PT Time Calculation (min) 45 min    Activity Tolerance Patient tolerated treatment well    Behavior During Therapy WFL for tasks assessed/performed             Past Medical History:  Diagnosis Date   Arthropathy, unspecified, site unspecified    Esophageal reflux    Osteoarthritis    Osteoporosis, unspecified    Other abnormal glucose    Other and unspecified hyperlipidemia    Symptomatic menopausal or female climacteric states    Syncope and collapse    Past Surgical History:  Procedure Laterality Date   ABDOMINAL HYSTERECTOMY     APPENDECTOMY     BREAST BIOPSY Left 1970's   neg   COLONOSCOPY     CYST REMOVAL TRUNK  11/24/2020   from back   FINGER FRACTURE SURGERY     Patient Active Problem List   Diagnosis Date Noted   History of cerebrovascular accident (CVA) with residual deficit 08/28/2023   Left hip pain 06/19/2022   Lumbar radiculopathy 04/02/2022   Gait abnormality 04/02/2022   Urinary urgency 04/02/2022   Familial hyperlipidemia 12/07/2020   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   Abscess 04/07/2020   Venous insufficiency of both lower extremities 12/01/2019   Suspected sleep apnea 07/13/2018   Former smoker 07/13/2018   Multiple renal cysts 11/12/2017   Vitamin D deficiency 10/15/2017   Spondylosis of lumbar region without myelopathy or radiculopathy 06/10/2017   Chronic right-sided low back pain with sciatica 06/10/2017   Primary osteoarthritis involving multiple joints  06/10/2017   BPPV (benign paroxysmal positional vertigo), right 06/10/2017   Nausea without vomiting 06/10/2017   Environmental and seasonal allergies 02/12/2017   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/2017   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   Dupuytren's contracture of left hand 10/14/2016   Anxiety disorder 09/03/2016   GERD (gastroesophageal reflux disease) 09/03/2016   Prophylactic use of raloxifene ([place]) 09/03/2016   Hyperlipidemia associated with type 2 diabetes mellitus (HCC) 09/03/2016   Type 2 diabetes mellitus with other specified complication (HCC) 09/12/2015   Dyspnea 07/23/2013   Essential hypertension     ONSET DATE: October 2024  REFERRING DIAG: R29.898 (ICD-10-CM) - Bilateral leg weakness   I69.30 (ICD-10-CM) - History of cerebrovascular accident (CVA) with residual deficit   THERAPY DIAG:  Weakness of both lower extremities  Muscle weakness (generalized)  History of cerebrovascular accident (CVA) with residual deficit  Rationale for Evaluation and Treatment: Rehabilitation  SUBJECTIVE: From initial evaluation on 09/08/23  SUBJECTIVE STATEMENT: Pt reports at end of October, 2024 (does not recall exact day) she was getting into bed and she felt like she stepped up on the step stool to get into bed and when she turned around she felt off balance and fell onto the floor. She reports she had difficulty moving her L LE and it felt a little weaker.  She reports she did not injure anything.  She crawled to her cedar chest and used that to push herself up from and used her R leg to stand up.  She reports she did not call any family or neighbors and she slept in the recliner chair that day.    She called her PCP the next week (few days later) to check in.  She was evaluated by PCP and referred to PT.  Pt states she was evaluated and referred to PT because the "episode" was a stroke.  She has not seen any other providers for this.  Current function: intermittently she feels like she is "dragging her L leg" since this incident.  Her L foot has caught a few times but she has not fallen since then.    She is now amb with SPC, she reports she did not use the [facility] prior to this "episode".  She feels like it is helpful when she gets tired.    Pt accompanied by: self; she states she has family that live close by (daughter)  PERTINENT HISTORY: pt reports she has h/o LBP (DJD)  and difficulty with prolonged standing/walking  intermittently; osteopenia; and h/o stable L hip fx; unclear which hip though- upon chart review the xray results from 03/2022 states stable R hip fx on impression; pt states she has seen orthopedist for this and opted to not pursue surgery.  PAIN:  Are you having pain? Not today  PRECAUTIONS: Fall  RED FLAGS: None   WEIGHT BEARING RESTRICTIONS: No  FALLS: Has patient fallen in last 6 months? Yes, 1x (see above)  LIVING ENVIRONMENT: Lives with: lives with their family and lives alone, pt is a widow; daughter "Arline Asp" lives 4 miles; has a Nurse, mental health at home Lives in: [employer]: 2 steps to enter home on deck, with railings on back of home; has 5 steps in front of home with railing  Has following equipment at home: Single point cane  PLOF: Independent  PATIENT GOALS: to be able to walk longer and with improved balance inside and outside of her home  OBJECTIVE:  Note: Objective measures were completed at Evaluation unless otherwise noted.  DIAGNOSTIC FINDINGS: per chart review: brain MRI on 08/07/23: FINDINGS: Brain: There is a 1.3 cm acute infarct in the right corona radiata. Chronic infarcts are noted in the left corona radiata, right basal ganglia, and both thalami. Small T2 hyperintensities elsewhere in the cerebral white matter bilaterally are nonspecific but  compatible with mild chronic small vessel ischemic disease. There is a single chronic microhemorrhage in the right cerebellar hemisphere. No mass, midline shift, or extra-axial fluid collection is identified. There is mild generalized cerebral atrophy. IMPRESSION: 1. Acute infarct in the right corona radiata. 2. 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    COGNITION: Overall cognitive status: Within functional limits for tasks assessed   SENSATION: WFL  COORDINATION: Pt demonstrated normal coordination with finger to nose with PT and LE heel/shin motion  POSTURE: rounded shoulders  LOWER EXTREMITY ROM:     Active  Right Eval Left Eval  Hip flexion [facility]  for sit to stand and amb [facility] for sit to stand and amb  Hip extension    Hip abduction    Hip adduction    Hip internal rotation    Hip external rotation    Knee flexion    Knee extension    Ankle dorsiflexion    Ankle plantarflexion    Ankle inversion    Ankle eversion     (Blank rows = not tested)  LOWER EXTREMITY MMT:    MMT Right Eval Left Eval  Hip flexion 5 4  Hip extension    Hip abduction 4 4  Hip adduction    Hip internal rotation 4+ 4  Hip external rotation 4+ 4  Knee flexion 5 4  Knee extension 5 4  Ankle dorsiflexion 5 4  Ankle plantarflexion    Ankle inversion    Ankle eversion    (Blank rows = not tested)  BED MOBILITY:  Will be assessed at future visit  TRANSFERS: Assistive device utilized: SPC during amb Sit to stand: Complete Independence Stand to sit: Complete Independence Chair to chair: able to without SPC, uses b/l UE support Floor: deferred today  STAIRS: Will assess at future visit  GAIT: Gait pattern: wide BOS and poor foot clearance- Left Distance walked: pt amb in/out of clinic independently Assistive device utilized: Single point cane Level of assistance: Complete Independence Comments: pt amb with decreased gait speed; states she  feels fatigued more easily during amb than prior to October  FUNCTIONAL TESTS:  5 times sit to stand: 26 seconds (indicating increased fall risk); pt uses UE support Additional balance testing will be administered at future visit  PATIENT SURVEYS:  FOTO will be administered at future visit; clinic lost electricity/internet during pt's appointment today  TODAY'S TREATMENT:                                                                                                                              DATE: 09/10/23  Subjective: pt reports it feels more comfortable using the cane at the new heigt we adjusted it to at last visit.  She reports no falls.  Legs feel weak.    Pain: none reported today 0/10  Objective: Vital signs at start of session pre-exercise: BP: 173/64 HR: 66 bpm SpO2: 98% Pt reports this BP is a little higher than her typical reading; she states she does take medication and it typically is elevated but controlled with medication from PCP  BP reading #2: 157/61   Therapeutic Exercises: Seated hip abd: 5 sec x 15 green tb Seated hip add: 5 sec x 15 manual resistance, ball Sit to stand 2x5 LAQ: R 3# 2x10, L 2# 2x10 Alternating marches 2x10 with weights Standing heel raises: 2x10  HEP instruction: pt given HEP she can perform in sitting today  Access Code: [email or web address] URL: [email or web address] Date: 09/10/2023 Prepared by: Max Fickle  Exercises - Seated Hip Adduction Isometrics with Newman Pies  - 1  x daily - 7 x weekly - 2 sets - 10 reps - 5 hold - Seated Hip Abduction with Resistance  - 1 x daily - 7 x weekly - 2 sets - 10 reps - 5 hold - Seated March  - 1 x daily - 7 x weekly - 2 sets - 10 reps   PATIENT EDUCATION: Education details: PT POC/goals, purpose of PT to address strength/balance/gait impairments and to reduce fall risk/improve safety during amb and ADLs at home Person educated: Patient Education method: Explanation Education  comprehension: verbalized understanding  HOME EXERCISE PROGRAM: [email or web address]  GOALS: Goals reviewed with patient? Yes  SHORT TERM GOALS: Target date: 10/07/23  Pt will be instructed on strategies to improve home enrivonment safety during amb at home to promote safe independent living with reduced risk for falls (lighting, throw rugs, obstacles, furniture placement) Baseline: initiated during evaluation today Goal status: INITIAL   LONG TERM GOALS: Target date: 10/27/22  Administer FOTO at next visit and set appropriate FOTO goal Baseline: will be set at visit #2 Goal status: INITIAL  2.  Improve L LE strength 1/2 MMT grade to promote improved ability to amb without SPC at PLOF for short distances around her home Baseline: using SPC for amb at self selected height, has not been properly fitted yet; PT adjusted height of SPC to appropriate level today at end of evaluation appointment Goal status: INITIAL  3.  Pt will be able to peform 5x STS in <15 seconds indicating reduced risk for falling Baseline: 26 seconds Goal status: INITIAL    ASSESSMENT:  CLINICAL IMPRESSION: Patient's BP in elevated range today; second reading was lower; safe to participate in therapeutic exercises.  Focused on LE strengthening that would be safe for her to perform as an initial HEP this weekend.  She tolerated these therapeutic exercises well.  Gave handouts and she verbalized understanding of how to do them at home.  Will advance and progress, and plan to incorporate further balance assessment and training into PT plan of care at next visit.  OBJECTIVE IMPAIRMENTS: Abnormal gait, decreased activity tolerance, decreased balance, decreased endurance, decreased mobility, difficulty walking, decreased strength, decreased safety awareness, and impaired perceived functional ability.   ACTIVITY LIMITATIONS: carrying, lifting, standing, squatting, stairs, and locomotion level  PARTICIPATION LIMITATIONS: meal  prep, cleaning, laundry, interpersonal relationship, shopping, and community activity  PERSONAL FACTORS: Time since onset of injury/illness/exacerbation and 1-2 comorbidities: h/o lumbar DJD and possible h/o hip fx (unclear from pt report today)  are also affecting patient's functional outcome.   REHAB POTENTIAL: Good  CLINICAL DECISION MAKING: Stable/uncomplicated  EVALUATION COMPLEXITY: Low  PLAN:  PT FREQUENCY: 2x/week  PT DURATION: 6 weeks  PLANNED INTERVENTIONS: 97110-Therapeutic exercises, 97530- Therapeutic activity, 97112- Neuromuscular re-education, 97535- Self Care, and 52841- Manual therapy  PLAN FOR NEXT SESSION: LE strengthening; further assessment of balance/walking endurance and transfers; 6 min walk test; BERG; FOTO at next session  Max Fickle, PT, DPT, OCS Ardine Bjork, PT 09/19/2023, 12:21 PM

## 2023-09-21 ENCOUNTER — Encounter: Payer: Self-pay | Admitting: Family Medicine

## 2023-09-24 ENCOUNTER — Ambulatory Visit: Payer: Medicare HMO

## 2023-09-24 ENCOUNTER — Encounter: Payer: Self-pay | Admitting: Family Medicine

## 2023-09-29 ENCOUNTER — Ambulatory Visit: Payer: Medicare HMO

## 2023-09-29 DIAGNOSIS — M6281 Muscle weakness (generalized): Secondary | ICD-10-CM | POA: Diagnosis not present

## 2023-09-29 DIAGNOSIS — I6930 Unspecified sequelae of cerebral infarction: Secondary | ICD-10-CM | POA: Diagnosis not present

## 2023-09-29 DIAGNOSIS — R29898 Other symptoms and signs involving the musculoskeletal system: Secondary | ICD-10-CM
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
## 2023-10-06 ENCOUNTER — Other Ambulatory Visit: Payer: Self-pay | Admitting: Family Medicine

## 2023-10-06 ENCOUNTER — Ambulatory Visit: Payer: Medicare HMO

## 2023-10-06 ENCOUNTER — Other Ambulatory Visit: Payer: Self-pay | Admitting: Neurology

## 2023-10-06 DIAGNOSIS — I6930 Unspecified sequelae of cerebral infarction: Secondary | ICD-10-CM | POA: Diagnosis not present

## 2023-10-06 DIAGNOSIS — G8929 Other chronic pain: Secondary | ICD-10-CM

## 2023-10-06 DIAGNOSIS — R29898 Other symptoms and signs involving the musculoskeletal system: Secondary | ICD-10-CM | POA: Diagnosis not present

## 2023-10-06 DIAGNOSIS — M47816 Spondylosis without myelopathy or radiculopathy, lumbar region: Secondary | ICD-10-CM

## 2023-10-06 DIAGNOSIS — M6281 Muscle weakness (generalized): Secondary | ICD-10-CM
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
## 2023-10-06 NOTE — Telephone Encounter (Signed)
Rx sent. Patient must keep appointment on 12/29/23 for future refills.

## 2023-10-10 NOTE — Telephone Encounter (Signed)
 Requested Prescriptions  Pending Prescriptions Disp Refills   naproxen  (NAPROSYN ) 500 MG tablet [Pharmacy Med Name: NAPROXEN  500 MG TABLET] 60 tablet 1    Sig: Take 1 tablet (500 mg total) by mouth 2 (two) times daily with a meal. For 1-2 weeks then as needed     Analgesics:  NSAIDS Failed - 10/10/2023  2:32 PM      Failed - Manual Review: Labs are only required if the patient has taken medication for more than 8 weeks.      Failed - Cr in normal range and within 360 days    Creat  Date Value Ref Range Status  07/31/2023 0.59 (L) 0.60 - 1.00 mg/dL Final   Creatinine, Urine  Date Value Ref Range Status  07/08/2023 166 20 - 275 mg/dL Final         Passed - HGB in normal range and within 360 days    Hemoglobin  Date Value Ref Range Status  12/25/2022 12.6 11.7 - 15.5 g/dL Final   HGB  Date Value Ref Range Status  11/20/2011 13.6 12.0 - 16.0 g/dL Final         Passed - PLT in normal range and within 360 days    Platelets  Date Value Ref Range Status  12/25/2022 173 140 - 400 Thousand/uL Final   Platelet  Date Value Ref Range Status  11/20/2011 191 150 - 440 x10 3/mm 3 Final         Passed - HCT in normal range and within 360 days    HCT  Date Value Ref Range Status  12/25/2022 38.1 35.0 - 45.0 % Final  11/20/2011 40.3 35.0 - 47.0 % Final         Passed - eGFR is 30 or above and within 360 days    GFR, Est African American  Date Value Ref Range Status  04/11/2021 91 > OR = 60 mL/min/1.61m2 Final   GFR, Est Non African American  Date Value Ref Range Status  04/11/2021 78 > OR = 60 mL/min/1.83m2 Final   eGFR  Date Value Ref Range Status  07/31/2023 92 > OR = 60 mL/min/1.63m2 Final         Passed - Patient is not pregnant      Passed - Valid encounter within last 12 months    Recent Outpatient Visits           3 weeks ago Essential hypertension   [place] [facility] [place] Marsa PARAS, DO   2 months ago Weakness of left upper  extremity   [place] [facility] [place], [place] W, NP   3 months ago Type 2 diabetes mellitus with other specified complication, without long-term current use of insulin [facility])   [facility] [place] Marsa PARAS, DO   5 months ago Urinary frequency   [place] [facility] [place], Angeline ORN, NP   6 months ago Urinary frequency   [place] [facility] [place], Angeline ORN, NP       Future Appointments             In 1 week McGowan, Clotilda DELENA RIGGERS [facility] [place]   In 3 months Ed[place], Marsa PARAS, DO [place] [facility] [place]

## 2023-10-13 ENCOUNTER — Ambulatory Visit: Payer: Medicare HMO | Attending: Internal Medicine

## 2023-10-13 DIAGNOSIS — M6281 Muscle weakness (generalized): Secondary | ICD-10-CM | POA: Insufficient documentation

## 2023-10-13 DIAGNOSIS — R29898 Other symptoms and signs involving the musculoskeletal system: Secondary | ICD-10-CM | POA: Insufficient documentation

## 2023-10-13 DIAGNOSIS — I6930 Unspecified sequelae of cerebral infarction: Secondary | ICD-10-CM | POA: Diagnosis not present

## 2023-10-13 NOTE — Therapy (Signed)
 OUTPATIENT PHYSICAL THERAPY NEURO TREATMENT   Patient Name: Yvette [name] MRN: [id number] DOB:[birth year], 78 y.o., female Today's Date: 10/13/2023   PCP: Dr. Edman, DO REFERRING PROVIDER: Angeline Laura, NP  END OF SESSION:09/10/23  PT End of Session - 10/13/23 1423     Visit Number 5    Number of Visits 13    Date for PT Re-Evaluation 10/28/23    Authorization Type 2x/week x 6 weeks (10/27/22); Medicare PN at visit #10    PT Start Time 1425    PT Stop Time 1515    PT Time Calculation (min) 50 min    Equipment Utilized During Treatment Gait belt    Activity Tolerance Patient tolerated treatment well    Behavior During Therapy WFL for tasks assessed/performed             Past Medical History:  Diagnosis Date   Arthropathy, unspecified, site unspecified    Esophageal reflux    Osteoarthritis    Osteoporosis, unspecified    Other abnormal glucose    Other and unspecified hyperlipidemia    Symptomatic menopausal or female climacteric states    Syncope and collapse    Past Surgical History:  Procedure Laterality Date   ABDOMINAL HYSTERECTOMY     APPENDECTOMY     BREAST BIOPSY Left 1970's   neg   COLONOSCOPY     CYST REMOVAL TRUNK  11/24/2020   from back   FINGER FRACTURE SURGERY     Patient Active Problem List   Diagnosis Date Noted   History of cerebrovascular accident (CVA) with residual deficit 08/28/2023   Left hip pain 06/19/2022   Lumbar radiculopathy 04/02/2022   Gait abnormality 04/02/2022   Urinary urgency 04/02/2022   Familial hyperlipidemia 12/07/2020   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   Abscess 04/07/2020   Venous insufficiency of both lower extremities 12/01/2019   Suspected sleep apnea 07/13/2018   Former smoker 07/13/2018   Multiple renal cysts 11/12/2017   Vitamin D  deficiency 10/15/2017   Spondylosis of lumbar region without myelopathy or radiculopathy 06/10/2017   Chronic right-sided low back pain with sciatica 06/10/2017   Primary  osteoarthritis involving multiple joints 06/10/2017   BPPV (benign paroxysmal positional vertigo), right 06/10/2017   Nausea without vomiting 06/10/2017   Environmental and seasonal allergies 02/12/2017   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/2017   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   Dupuytren's contracture of left hand 10/14/2016   Anxiety disorder 09/03/2016   GERD (gastroesophageal reflux disease) 09/03/2016   Prophylactic use of raloxifene  (Evista ) 09/03/2016   Hyperlipidemia associated with type 2 diabetes mellitus (HCC) 09/03/2016   Type 2 diabetes mellitus with other specified complication (HCC) 09/12/2015   Dyspnea 07/23/2013   Essential hypertension     ONSET DATE: October 2024  REFERRING DIAG: R29.898 (ICD-10-CM) - Bilateral leg weakness   I69.30 (ICD-10-CM) - History of cerebrovascular accident (CVA) with residual deficit   THERAPY DIAG:  Weakness of both lower extremities  Muscle weakness (generalized)  History of cerebrovascular accident (CVA) with residual deficit  Rationale for Evaluation and Treatment: Rehabilitation  SUBJECTIVE: From initial evaluation on 09/08/23  SUBJECTIVE STATEMENT: Pt reports at end of October, 2024 (does not recall exact day) she was getting into bed and she felt like she stepped up on the step stool to get into bed and when she turned around she felt off balance and fell onto the floor. She reports she had difficulty moving her L LE and it felt a little weaker.  She reports she did not injure anything.  She crawled to her cedar chest and used that to push herself up from and used her R leg to stand up.  She reports she did not call any family or neighbors and she slept in the recliner chair that day.    She called her PCP the next week (few days later) to check in.  She  was evaluated by PCP and referred to PT. Pt states she was evaluated and referred to PT because the episode was a stroke.  She has not seen any other providers for this.  Current function: intermittently she feels like she is dragging her L leg since this incident.  Her L foot has caught a few times but she has not fallen since then.    She is now amb with SPC, she reports she did not use the [facility] prior to this episode.  She feels like it is helpful when she gets tired.    Pt accompanied by: self; she states she has family that live close by (daughter)  PERTINENT HISTORY: pt reports she has h/o LBP (DJD)  and difficulty with prolonged standing/walking  intermittently; osteopenia; and h/o stable L hip fx; unclear which hip though- upon chart review the xray results from 03/2022 states stable R hip fx on impression; pt states she has seen orthopedist for this and opted to not pursue surgery.  PAIN:  Are you having pain? Not today  PRECAUTIONS: Fall  RED FLAGS: None   WEIGHT BEARING RESTRICTIONS: No  FALLS: Has patient fallen in last 6 months? Yes, 1x (see above)  LIVING ENVIRONMENT: Lives with: lives with their family and lives alone, pt is a widow; daughter Dorthea lives 4 miles; has a nurse, mental health at home Lives in: House/apartment Stairs: 2 steps to enter home on deck, with railings on back of home; has 5 steps in front of home with railing  Has following equipment at home: Single point cane  PLOF: Independent  PATIENT GOALS: to be able to walk longer and with improved balance inside and outside of her home  OBJECTIVE:  Note: Objective measures were completed at Evaluation unless otherwise noted.  DIAGNOSTIC FINDINGS: per chart review: brain MRI on 08/07/23: FINDINGS: Brain: There is a 1.3 cm acute infarct in the right corona radiata. Chronic infarcts are noted in the left corona radiata, right basal ganglia, and both thalami. Small T2 hyperintensities elsewhere in the cerebral  white matter bilaterally are nonspecific but compatible with mild chronic small vessel ischemic disease. There is a single chronic microhemorrhage in the right cerebellar hemisphere. No mass, midline shift, or extra-axial fluid collection is identified. There is mild generalized cerebral atrophy. IMPRESSION: 1. Acute infarct in the right corona radiata. 2. 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    COGNITION: Overall cognitive status: Within functional limits for tasks assessed   SENSATION: WFL  COORDINATION: Pt demonstrated normal coordination with finger to nose with PT and LE heel/shin motion  POSTURE: rounded shoulders  LOWER EXTREMITY ROM:     Active  Right Eval Left Eval  Hip flexion [facility]  for sit to stand and amb [facility] for sit to stand and amb  Hip extension    Hip abduction    Hip adduction    Hip internal rotation    Hip external rotation    Knee flexion    Knee extension    Ankle dorsiflexion    Ankle plantarflexion    Ankle inversion    Ankle eversion     (Blank rows = not tested)  LOWER EXTREMITY MMT:    MMT Right Eval Left Eval  Hip flexion 5 4  Hip extension    Hip abduction 4 4  Hip adduction    Hip internal rotation 4+ 4  Hip external rotation 4+ 4  Knee flexion 5 4  Knee extension 5 4  Ankle dorsiflexion 5 4  Ankle plantarflexion    Ankle inversion    Ankle eversion    (Blank rows = not tested)  BED MOBILITY:  Will be assessed at future visit  TRANSFERS: Assistive device utilized: SPC during amb Sit to stand: Complete Independence Stand to sit: Complete Independence Chair to chair: able to without SPC, uses b/l UE support Floor: deferred today  STAIRS: Will assess at future visit  GAIT: Gait pattern: wide BOS and poor foot clearance- Left Distance walked: pt amb in/out of clinic independently Assistive device utilized: Single point cane Level of assistance: Complete Independence Comments:  pt amb with decreased gait speed; states she feels fatigued more easily during amb than prior to October  FUNCTIONAL TESTS:  5 times sit to stand: 26 seconds (indicating increased fall risk); pt uses UE support Additional balance testing will be administered at future visit  PATIENT SURVEYS:  FOTO will be administered at future visit; clinic lost electricity/internet during pt's appointment today  TODAY'S TREATMENT:                                                                                                                              DATE: 10/13/23  Subjective: pt reports she talked to her daughter about getting a grab bar and her family will help her install it soon.  She states she went to urgent care earlier today for assessment because she was concerned about L hand tingling, was evaluated and states she was d/c; states her BP was high but was told it is in safe range and sent home.  She reports no tingling in hand, no headache or vision changes or changes in weakness.  She has been working on her HEP, not consistently though.  Pain: none reported today 3-4/10 L hip today  Objective: FOTO 40/52 Vital signs at start of session pre-exercise: BP: 176/76 HR 65, 2 min rest, took a second time and was same reading, elevated, continued to monitor throughout session  Therapeutic Exercises: Nustep: level 2 x 10 minutes, focus on LE strength to address her impairment of decreased walking endurance after previous sessions 6 min walk test attempt.  Sit to stand 3x5  from chair with airex, focused on reducing UE use, verbal cues to stand tall and lower slowly, discussed benefit of adding this to her HEP LAQ: R 3# 2x10, L 2# 2x10 Alternating marches 2x10 with weights- not today Standing marches in parallel bars: 3 laps in parallel bars Hurdles: forward 2 laps in parallel bars Hurdles: side stepping in parallel bars 2 laps Amb x 3 min with SPC at end and PT supervision on flat surface to  car  Post-tx BP: 171/76  Pt education: reviewed today- spent extensive time during session in between exercises discussing the benefit of grab bar installation to improve safety while getting in/out of her shower which requires her to step over the edge of a tub.  Recommended stainless steel type of bar at tub threshold and also inside the shower.  She states she has family who can help her install something like this.   Not today: front step ups: R x 9, L x6; used b/l railing. Not today Standing heel raises: 2x10 Seated toe raises: 2x15 6 min walk test: stopped at 5:40 today due to pt reporting LE L>R fatigue (340 ft) with SPC and PT supervision Seated hip abd: 5 sec x 15 green tb- not today- HEP Seated hip add: 5 sec x 15 manual resistance, ball- not today, HEP  HEP Access Code: [email or web address] URL: [email or web address] Date: 10/06/2023 Prepared by: Vernell Reges  Exercises - Seated Hip Adduction Isometrics with Mercer  - 1 x daily - 7 x weekly - 2 sets - 10 reps - 5 hold - Seated Hip Abduction with Resistance  - 1 x daily - 7 x weekly - 2 sets - 10 reps - 5 hold - Seated March  - 1 x daily - 7 x weekly - 2 sets - 10 reps - Seated Heel Toe Raises  - 2 x daily - 7 x weekly - 2 sets - 15 reps - Sit to Stand  - 2 x daily - 7 x weekly - 2 sets - 5 reps   PATIENT EDUCATION: Education details: PT POC/goals, purpose of PT to address strength/balance/gait impairments and to reduce fall risk/improve safety during amb and ADLs at home Person educated: Patient Education method: Explanation Education comprehension: verbalized understanding  HOME EXERCISE PROGRAM: [email or web address]  GOALS: Goals reviewed with patient? Yes  SHORT TERM GOALS: Target date: 10/07/23  Pt will be instructed on strategies to improve home enrivonment safety during amb at home to promote safe independent living with reduced risk for falls (lighting, throw rugs, obstacles, furniture placement) Baseline:  initiated during evaluation today Goal status: INITIAL   LONG TERM GOALS: Target date: 10/27/22  Administer FOTO at next visit and set appropriate FOTO goal Baseline: will be set at visit #2 Goal status: INITIAL  2.  Improve L LE strength 1/2 MMT grade to promote improved ability to amb without SPC at PLOF for short distances around her home Baseline: using SPC for amb at self selected height, has not been properly fitted yet; PT adjusted height of SPC to appropriate level today at end of evaluation appointment Goal status: INITIAL  3.  Pt will be able to peform 5x STS in <15 seconds indicating reduced risk for falling Baseline: 26 seconds Goal status: INITIAL    ASSESSMENT:  CLINICAL IMPRESSION: Pt presents with elevated BP; monitored throughout today's session.  Decrease in systolic noted at end of session.  Educated pt on rechecking at home and continuing to monitor and what range >180/120 indicates level that is  considered urgent and requires medical attention.  Overall, she tolerated the therapeutic exercises well.  Her LE weakness is contributing to difficulty getting up from chair without UE support and difficulty walking longer than a few minutes.  She should continue to benefit from consistent skilled PT to improve strength, balance, and walking tolerance to facilitate improved safety during ambulation and improved ability to perform her daily activities at [facility] before her CVA.  OBJECTIVE IMPAIRMENTS: Abnormal gait, decreased activity tolerance, decreased balance, decreased endurance, decreased mobility, difficulty walking, decreased strength, decreased safety awareness, and impaired perceived functional ability.   ACTIVITY LIMITATIONS: carrying, lifting, standing, squatting, stairs, and locomotion level  PARTICIPATION LIMITATIONS: meal prep, cleaning, laundry, interpersonal relationship, shopping, and community activity  PERSONAL FACTORS: Time since onset of  injury/illness/exacerbation and 1-2 comorbidities: h/o lumbar DJD and possible h/o hip fx (unclear from pt report today)  are also affecting patient's functional outcome.   REHAB POTENTIAL: Good  CLINICAL DECISION MAKING: Stable/uncomplicated  EVALUATION COMPLEXITY: Low  PLAN:  PT FREQUENCY: 2x/week  PT DURATION: 6 weeks  PLANNED INTERVENTIONS: 97110-Therapeutic exercises, 97530- Therapeutic activity, 97112- Neuromuscular re-education, 97535- Self Care, and [id number]- Manual therapy  PLAN FOR NEXT SESSION: LE strengthening; BERG; continue focusing on sit to stand, step ups, monitor BP at start at pt's was elevated today during session  Vernell Reges, PT, DPT, OCS Amario Longmore E Alitza Cowman, PT 10/13/2023, 3:34 PM

## 2023-10-14 ENCOUNTER — Ambulatory Visit: Payer: Medicare HMO | Admitting: Urology

## 2023-10-15 ENCOUNTER — Ambulatory Visit: Payer: Medicare HMO

## 2023-10-15 DIAGNOSIS — M6281 Muscle weakness (generalized): Secondary | ICD-10-CM

## 2023-10-15 DIAGNOSIS — R29898 Other symptoms and signs involving the musculoskeletal system: Secondary | ICD-10-CM | POA: Diagnosis not present

## 2023-10-15 DIAGNOSIS — I6930 Unspecified sequelae of cerebral infarction: Secondary | ICD-10-CM | POA: Diagnosis not present

## 2023-10-15 NOTE — Therapy (Signed)
 OUTPATIENT PHYSICAL THERAPY NEURO TREATMENT   Patient Name: Yvette [name] MRN: [id number] DOB:1946/02/08, 78 y.o., female Today's Date: 10/15/2023   PCP: Dr. Edman, DO REFERRING PROVIDER: Angeline Laura, NP  END OF SESSION:09/10/23  PT End of Session - 10/15/23 1415     Visit Number 6    Number of Visits 13    Date for PT Re-Evaluation 10/28/23    Authorization Type 2x/week x 6 weeks (10/27/22); Medicare PN at visit #10    PT Start Time 1415    PT Stop Time 1500    PT Time Calculation (min) 45 min    Equipment Utilized During Treatment Gait belt    Activity Tolerance Patient tolerated treatment well    Behavior During Therapy WFL for tasks assessed/performed             Past Medical History:  Diagnosis Date   Arthropathy, unspecified, site unspecified    Esophageal reflux    Osteoarthritis    Osteoporosis, unspecified    Other abnormal glucose    Other and unspecified hyperlipidemia    Symptomatic menopausal or female climacteric states    Syncope and collapse    Past Surgical History:  Procedure Laterality Date   ABDOMINAL HYSTERECTOMY     APPENDECTOMY     BREAST BIOPSY Left 1970's   neg   COLONOSCOPY     CYST REMOVAL TRUNK  11/24/2020   from back   FINGER FRACTURE SURGERY     Patient Active Problem List   Diagnosis Date Noted   History of cerebrovascular accident (CVA) with residual deficit 08/28/2023   Left hip pain 06/19/2022   Lumbar radiculopathy 04/02/2022   Gait abnormality 04/02/2022   Urinary urgency 04/02/2022   Familial hyperlipidemia 12/07/2020   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   Abscess 04/07/2020   Venous insufficiency of both lower extremities 12/01/2019   Suspected sleep apnea 07/13/2018   Former smoker 07/13/2018   Multiple renal cysts 11/12/2017   Vitamin D  deficiency 10/15/2017   Spondylosis of lumbar region without myelopathy or radiculopathy 06/10/2017   Chronic right-sided low back pain with sciatica 06/10/2017   Primary  osteoarthritis involving multiple joints 06/10/2017   BPPV (benign paroxysmal positional vertigo), right 06/10/2017   Nausea without vomiting 06/10/2017   Environmental and seasonal allergies 02/12/2017   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/2017   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   Dupuytren's contracture of left hand 10/14/2016   Anxiety disorder 09/03/2016   GERD (gastroesophageal reflux disease) 09/03/2016   Prophylactic use of raloxifene  (Evista ) 09/03/2016   Hyperlipidemia associated with type 2 diabetes mellitus (HCC) 09/03/2016   Type 2 diabetes mellitus with other specified complication (HCC) 09/12/2015   Dyspnea 07/23/2013   Essential hypertension     ONSET DATE: October 2024  REFERRING DIAG: R29.898 (ICD-10-CM) - Bilateral leg weakness   I69.30 (ICD-10-CM) - History of cerebrovascular accident (CVA) with residual deficit   THERAPY DIAG:  Weakness of both lower extremities  Muscle weakness (generalized)  Rationale for Evaluation and Treatment: Rehabilitation  SUBJECTIVE: From initial evaluation on 09/08/23  SUBJECTIVE STATEMENT: Pt reports at end of October, 2024 (does not recall exact day) she was getting into bed and she felt like she stepped up on the step stool to get into bed and when she turned around she felt off balance and fell onto the floor. She reports she had difficulty moving her L LE and it felt a little weaker.  She reports she did not injure anything.  She crawled to her cedar chest and used that to push herself up from and used her R leg to stand up.  She reports she did not call any family or neighbors and she slept in the recliner chair that day.    She called her PCP the next week (few days later) to check in.  She was evaluated by PCP and referred to PT. Pt states she was  evaluated and referred to PT because the episode was a stroke.  She has not seen any other providers for this.  Current function: intermittently she feels like she is dragging her L leg since this incident.  Her L foot has caught a few times but she has not fallen since then.    She is now amb with SPC, she reports she did not use the [facility] prior to this episode.  She feels like it is helpful when she gets tired.    Pt accompanied by: self; she states she has family that live close by (daughter)  PERTINENT HISTORY: pt reports she has h/o LBP (DJD)  and difficulty with prolonged standing/walking  intermittently; osteopenia; and h/o stable L hip fx; unclear which hip though- upon chart review the xray results from 03/2022 states stable R hip fx on impression; pt states she has seen orthopedist for this and opted to not pursue surgery.  PAIN:  Are you having pain? Not today  PRECAUTIONS: Fall  RED FLAGS: None   WEIGHT BEARING RESTRICTIONS: No  FALLS: Has patient fallen in last 6 months? Yes, 1x (see above)  LIVING ENVIRONMENT: Lives with: lives with their family and lives alone, pt is a widow; daughter Dorthea lives 4 miles; has a nurse, mental health at home Lives in: House/apartment Stairs: 2 steps to enter home on deck, with railings on back of home; has 5 steps in front of home with railing  Has following equipment at home: Single point cane  PLOF: Independent  PATIENT GOALS: to be able to walk longer and with improved balance inside and outside of her home  OBJECTIVE:  Note: Objective measures were completed at Evaluation unless otherwise noted.  DIAGNOSTIC FINDINGS: per chart review: brain MRI on 08/07/23: FINDINGS: Brain: There is a 1.3 cm acute infarct in the right corona radiata. Chronic infarcts are noted in the left corona radiata, right basal ganglia, and both thalami. Small T2 hyperintensities elsewhere in the cerebral white matter bilaterally are nonspecific but compatible with  mild chronic small vessel ischemic disease. There is a single chronic microhemorrhage in the right cerebellar hemisphere. No mass, midline shift, or extra-axial fluid collection is identified. There is mild generalized cerebral atrophy. IMPRESSION: 1. Acute infarct in the right corona radiata. 2. 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    COGNITION: Overall cognitive status: Within functional limits for tasks assessed   SENSATION: WFL  COORDINATION: Pt demonstrated normal coordination with finger to nose with PT and LE heel/shin motion  POSTURE: rounded shoulders  LOWER EXTREMITY ROM:     Active  Right Eval Left Eval  Hip flexion [facility]  for sit to stand and amb [facility] for sit to stand and amb  Hip extension    Hip abduction    Hip adduction    Hip internal rotation    Hip external rotation    Knee flexion    Knee extension    Ankle dorsiflexion    Ankle plantarflexion    Ankle inversion    Ankle eversion     (Blank rows = not tested)  LOWER EXTREMITY MMT:    MMT Right Eval Left Eval  Hip flexion 5 4  Hip extension    Hip abduction 4 4  Hip adduction    Hip internal rotation 4+ 4  Hip external rotation 4+ 4  Knee flexion 5 4  Knee extension 5 4  Ankle dorsiflexion 5 4  Ankle plantarflexion    Ankle inversion    Ankle eversion    (Blank rows = not tested)  BED MOBILITY:  Will be assessed at future visit  TRANSFERS: Assistive device utilized: SPC during amb Sit to stand: Complete Independence Stand to sit: Complete Independence Chair to chair: able to without SPC, uses b/l UE support Floor: deferred today  STAIRS: Will assess at future visit  GAIT: Gait pattern: wide BOS and poor foot clearance- Left Distance walked: pt amb in/out of clinic independently Assistive device utilized: Single point cane Level of assistance: Complete Independence Comments: pt amb with decreased gait speed; states she feels fatigued  more easily during amb than prior to October  FUNCTIONAL TESTS:  5 times sit to stand: 26 seconds (indicating increased fall risk); pt uses UE support Additional balance testing will be administered at future visit  PATIENT SURVEYS:  FOTO will be administered at future visit; clinic lost electricity/internet during pt's appointment today  TODAY'S TREATMENT:                                                                                                                              DATE: 10/15/23  Subjective: pt reports her blood pressure was lower when she checked it at home this morning; she states she would like a picture of what type of grab bar would be good for her shower/tub at home.  Her L hip is a little sore upon arrival; otherwise no new pain to report  Pain: none reported today 3-4/10 L hip today  Objective: FOTO 40/52 Vital signs at start of session pre-exercise: BP: 154/64, HR 60 bpm at start of session today  Therapeutic Exercises: Nustep: level 2 x 10 minutes, focus on LE strength to address her impairment of decreased walking endurance after previous sessions 6 min walk test attempt.  Sit to stand 3x5 from chair with airex, focused on reducing UE use, verbal cues to stand tall and lower slowly, discussed benefit of adding this to her HEP LAQ: R 4# 2x10, L 3# 2x10 Alternating marches 2x10 same weight as above, seated, x15 ea Standing marches in parallel bars: 3 laps in  parallel bars Hurdles: forward 2 laps in parallel bars Hurdles: side stepping in parallel bars 2 laps Amb x 3 min with SPC at end and PT supervision on flat surface to car Front step ups: x10 R and x10 L with b/l UE support Standing heel raises: x25 Standing foot taps alternating onto 6 inch step: x15 ea with b/l UE support, verbal cues to lift leg  Pt education: reviewed the type of grab bar (stainless steel type) to have installed via hardware, not suction cups to promote improved safety with  transfers in/out of her shower which has tub to step over   Not today: Seated toe raises: 2x15 6 min walk test: stopped at 5:40 today due to pt reporting LE L>R fatigue (340 ft) with SPC and PT supervision Seated hip abd: 5 sec x 15 green tb- not today- HEP Seated hip add: 5 sec x 15 manual resistance, ball- not today, HEP  HEP Access Code: [email or web address] URL: [email or web address] Date: 10/06/2023 Prepared by: Vernell Reges  Exercises - Seated Hip Adduction Isometrics with Mercer  - 1 x daily - 7 x weekly - 2 sets - 10 reps - 5 hold - Seated Hip Abduction with Resistance  - 1 x daily - 7 x weekly - 2 sets - 10 reps - 5 hold - Seated March  - 1 x daily - 7 x weekly - 2 sets - 10 reps - Seated Heel Toe Raises  - 2 x daily - 7 x weekly - 2 sets - 15 reps - Sit to Stand  - 2 x daily - 7 x weekly - 2 sets - 5 reps   PATIENT EDUCATION: Education details: PT POC/goals, purpose of PT to address strength/balance/gait impairments and to reduce fall risk/improve safety during amb and ADLs at home Person educated: Patient Education method: Explanation Education comprehension: verbalized understanding  HOME EXERCISE PROGRAM: [email or web address]  GOALS: Goals reviewed with patient? Yes  SHORT TERM GOALS: Target date: 10/07/23  Pt will be instructed on strategies to improve home enrivonment safety during amb at home to promote safe independent living with reduced risk for falls (lighting, throw rugs, obstacles, furniture placement) Baseline: initiated during evaluation today Goal status: INITIAL   LONG TERM GOALS: Target date: 10/27/22  Administer FOTO at next visit and set appropriate FOTO goal Baseline: will be set at visit #2 Goal status: INITIAL  2.  Improve L LE strength 1/2 MMT grade to promote improved ability to amb without SPC at PLOF for short distances around her home Baseline: using SPC for amb at self selected height, has not been properly fitted yet; PT adjusted height of  SPC to appropriate level today at end of evaluation appointment Goal status: INITIAL  3.  Pt will be able to peform 5x STS in <15 seconds indicating reduced risk for falling Baseline: 26 seconds Goal status: INITIAL    ASSESSMENT:  CLINICAL IMPRESSION: Pt's BP was lower today than at previous session.  Overall, she tolerated the therapeutic exercises for LE strengthening well.  She was able to amb at end of session with reduced L foot dragging observed compared to previous session.  Overall still lacks sufficient and strength and endurance in her lower extremities for getting up from chair without UE support and walking longer than a few minutes.  She should continue to benefit from consistent skilled PT to improve strength, balance, and walking tolerance to facilitate improved safety during ambulation and improved ability to perform her daily activities at [facility] before her  CVA.  OBJECTIVE IMPAIRMENTS: Abnormal gait, decreased activity tolerance, decreased balance, decreased endurance, decreased mobility, difficulty walking, decreased strength, decreased safety awareness, and impaired perceived functional ability.   ACTIVITY LIMITATIONS: carrying, lifting, standing, squatting, stairs, and locomotion level  PARTICIPATION LIMITATIONS: meal prep, cleaning, laundry, interpersonal relationship, shopping, and community activity  PERSONAL FACTORS: Time since onset of injury/illness/exacerbation and 1-2 comorbidities: h/o lumbar DJD and possible h/o hip fx (unclear from pt report today)  are also affecting patient's functional outcome.   REHAB POTENTIAL: Good  CLINICAL DECISION MAKING: Stable/uncomplicated  EVALUATION COMPLEXITY: Low  PLAN:  PT FREQUENCY: 2x/week  PT DURATION: 6 weeks  PLANNED INTERVENTIONS: 97110-Therapeutic exercises, 97530- Therapeutic activity, 97112- Neuromuscular re-education, 97535- Self Care, and 02859- Manual therapy  PLAN FOR NEXT SESSION: LE strengthening; BERG;  continue focusing on sit to stand, step ups, monitor BP at start of tx; follow up on grab bar; practice car transfers to improve safety with this activity  Vernell Reges, PT, DPT, OCS Dwayn [place] E Kinda Pottle, PT 10/15/2023, 2:15 PM

## 2023-10-17 NOTE — Progress Notes (Signed)
 10/20/2023 1:59 PM   Yvette [name] May 17, 1946 [id number]  Referring provider: Edman Marsa PARAS, DO [address] [place],  [place] [address]  Urological history: 1. GSM -contributing factors of age,  -Vaginal estrogen cream 3 nights weekly  2. OAB -Contributing factors of age, GSM, diabetes, hypertension, obesity, lumbar radiculopathy, anxiety, possible sleep apnea, CVA and history of smoking -Gemtesa  75 mg daily   Chief Complaint  Patient presents with   Over Active Bladder   HPI: Yvette [name] is a 78 y.o. female who presents today for  three month follow up.    Previous records reviewed.   She is having 1-7 daytime voids, strong to severe urge to urinate.  She has urge incontinence.  She is leaking 1-2 times a day.  She wears panty liners 1-3 a day and absorbent depends 1-2 times a day.  She does limit fluid intake.  She does engage in toilet mapping..  Patient denies any modifying or aggravating factors.  Patient denies any recent UTI's, gross hematuria, dysuria or suprapubic/flank pain.  Patient denies any fevers, chills, nausea or vomiting.    She did not take any Gemtesa  medication.  She states she did not receive samples and a prescription was not called in.  . She is using the vaginal estrogen cream..  She is having neurological issues at this time that are more pressing.  PVR 12 mL    PMH: Past Medical History:  Diagnosis Date   Arthropathy, unspecified, site unspecified    Esophageal reflux    Osteoarthritis    Osteoporosis, unspecified    Other abnormal glucose    Other and unspecified hyperlipidemia    Symptomatic menopausal or female climacteric states    Syncope and collapse     Surgical History: Past Surgical History:  Procedure Laterality Date   ABDOMINAL HYSTERECTOMY     APPENDECTOMY     BREAST BIOPSY Left 1970's   neg   COLONOSCOPY     CYST REMOVAL TRUNK  11/24/2020   from back   FINGER FRACTURE SURGERY      Home Medications:   Allergies as of 10/20/2023       Reactions   Simvastatin  Other (See Comments)   Myalgia, muscle aches lower extremity   Erythromycin Nausea And Vomiting        Medication List        Accurate as of October 20, 2023  1:59 PM. If you have any questions, ask your nurse or doctor.          STOP taking these medications    Cetirizine HCl 10 MG Caps   Gemtesa  75 MG Tabs Generic drug: Vibegron    trimethoprim -polymyxin b  ophthalmic solution Commonly known as: Polytrim        TAKE these medications    albuterol  108 (90 Base) MCG/ACT inhaler Commonly known as: VENTOLIN  HFA Inhale 2 puffs into the lungs every 4 (four) hours as needed for wheezing or shortness of breath (cough).   amLODipine  10 MG tablet Commonly known as: NORVASC  Take 1 tablet (10 mg total) by mouth daily.   ascorbic acid 500 MG tablet Commonly known as: VITAMIN C Take by mouth.   aspirin  81 MG tablet Take 81 mg by mouth daily.   beta carotene w/minerals tablet Take 1 tablet by mouth daily.   carvedilol  12.5 MG tablet Commonly known as: COREG  Take 1 tablet (12.5 mg total) by mouth 2 (two) times daily with a meal.   Cholecalciferol 10 MCG (400  UNIT) Caps Take 1 tablet by mouth daily with breakfast.   estradiol  0.1 MG/GM vaginal cream Commonly known as: ESTRACE  Estrogen Cream Instruction  Discard applicator  Apply pea sized amount to tip of finger to urethra before bed. Wash hands well after application. Use Monday, Wednesday and Friday   fluticasone  50 MCG/ACT nasal spray Commonly known as: FLONASE  Place 2 sprays into both nostrils daily.   furosemide  20 MG tablet Commonly known as: LASIX  Take 1-2 tablets (20-40 mg total) by mouth daily as needed for fluid or edema. Recommend use up to 3-5 days at a time if needed.   gabapentin  300 MG capsule Commonly known as: NEURONTIN  Take 2 capsules (600 mg total) by mouth at bedtime as needed.   hydrochlorothiazide  25 MG tablet Commonly known  as: HYDRODIURIL  Take 1 tablet (25 mg total) by mouth daily.   irbesartan  300 MG tablet Commonly known as: AVAPRO  Take 1 tablet (300 mg total) by mouth daily.   Krill Oil 300 MG Caps Take by mouth daily.   loratadine 10 MG tablet Commonly known as: CLARITIN Take 10 mg by mouth daily.   mirabegron  ER 50 MG Tb24 tablet Commonly known as: MYRBETRIQ  Take 1 tablet (50 mg total) by mouth daily.   multivitamin tablet Take 1 tablet by mouth daily.   naproxen  500 MG tablet Commonly known as: NAPROSYN  Take 1 tablet (500 mg total) by mouth 2 (two) times daily with a meal. For 1-2 weeks then as needed   pantoprazole  40 MG tablet Commonly known as: PROTONIX  Take 1 tablet (40 mg total) by mouth daily before breakfast.   POTASSIUM & MAGNESIUM ASPARTAT PO Take 1 tablet by mouth daily.   Repatha  SureClick 140 MG/ML Soaj Generic drug: Evolocumab  Inject 140 mg into the skin every 14 (fourteen) days.   TYLENOL  8 HOUR ARTHRITIS PAIN PO Take by mouth. 2 in am 1 in pm        Allergies:  Allergies  Allergen Reactions   Simvastatin  Other (See Comments)    Myalgia, muscle aches lower extremity   Erythromycin Nausea And Vomiting    Family History: Family History  Problem Relation Age of Onset   Diabetes Mother    Heart disease Mother    Heart attack Mother    Ulcers Father    Gallstones Father    Heart attack Brother     Social History:  reports that she quit smoking about 31 years ago. Her smoking use included cigarettes. She started smoking about 57 years ago. She has a 26 pack-year smoking history. She has been exposed to tobacco smoke. She has quit using smokeless tobacco. She reports that she does not currently use alcohol. She reports that she does not use drugs.  ROS: Pertinent ROS in HPI  Physical Exam: BP (!) 155/77 (BP Location: Left Arm, Patient Position: Sitting, Cuff Size: Large)   Pulse 68   SpO2 96%   Constitutional:  Well nourished. Alert and oriented, No  acute distress. HEENT: [place] AT, moist mucus membranes.  Trachea midline Cardiovascular: No clubbing, cyanosis, or edema. Respiratory: Normal respiratory effort, no increased work of breathing. Neurologic: Grossly intact, no focal deficits, moving all 4 extremities. Psychiatric: Normal mood and affect.    Laboratory Data: Lab Results  Component Value Date   WBC 4.2 12/25/2022   HGB 12.6 12/25/2022   HCT 38.1 12/25/2022   MCV 86.0 12/25/2022   PLT 173 12/25/2022   Lab Results  Component Value Date   CREATININE 0.59 (L) 07/31/2023  Lab Results  Component Value Date   HGBA1C 6.4 (H) 07/31/2023   Lab Results  Component Value Date   TSH 3.15 12/25/2022      Component Value Date/Time   CHOL 256 (H) 07/31/2023 1038   HDL 57 07/31/2023 1038   CHOLHDL 4.5 07/31/2023 1038   VLDL 59 (H) 10/16/2016 0001   LDLCALC 169 (H) 07/31/2023 1038   Lab Results  Component Value Date   AST 15 07/31/2023   Lab Results  Component Value Date   ALT 10 07/31/2023   Urinalysis See EPIC and HPI  I have reviewed the labs.   Pertinent Imaging:  10/20/23 13:31  Scan Result 13ml    Assessment & Plan:    1. OAB -We discussed that overactive bladder (OAB) is not a disease, but is a symptom complex that is generally not life-threatening -Symptoms typically include urinary urgency, frequency, and urge incontinence.   -There are numerous treatment options: behavioral therapies (including bladder training, pelvic floor muscle training, and fluid management), oral antimuscarinics and beta-3 agonist (Mybetriq) and minimally invasive options include intra-detrusor botox, peripheral tibial nerve stimulation (PTNS), and interstim (SNS).  -These are the anticholinergic medications available for OAB:     *Darifenacin (Enablex)  *Fesoterodine (Toviaz)  Oxybutynin (Ditropan, Ditropan XL, Gelnique, Oxytrol)  Solifenacin (Vesicare)  Tolterodine (Detrol, Detrol LA)  *Trospium (Sanctura, Sanctura XR)   -The anticholinergic medications are available in generics, but they have the side effects of dry mouth, dry eyes, constipation, risk of developing dementia/Alzheimer's disease or the worsening of any dementia/Alzheimer's disease (the * have less risk anecdotally) -These are the beta-3 adrenergic agonists:  Mirabegron  (Myrbetriq )  Vibegron  (Gemtesa )  -The beta-3 adrenergic agonists are brand name only and have limited insurance coverage and are expensive as an out of pocket options -Explained that PTNS may result in a ~ 50 % improvement in OAB symptoms and will require 12 weekly treatments followed by monthly maintenance treatments for up to two years  -Explained that Botox is injected into the bladder either in the OR or in the office and has a risk of ~ 6 % of retention and that the patient will have to CIC or have an indwelling Foley until Botox wears off -the interstim is a pacemaker for the bladder and will need to be referred to an implanter  -Had samples of the Myrbetriq  50 mg once daily on hand so I gave her 6 weeks samples, but we will give her time to work up her neurological issues and then we can discuss this further when she returns  2. GSM -Continue vaginal estrogen cream 3 nights weekly   Return in about 6 weeks (around 12/01/2023) for OAB and PVR .  These notes generated with voice recognition software. I apologize for typographical errors.  CLOTILDA HELON RIGGERS  [facility] [address]  [address] [place], [place] [address] [phone]

## 2023-10-20 ENCOUNTER — Ambulatory Visit: Payer: Medicare HMO | Admitting: Urology

## 2023-10-20 ENCOUNTER — Encounter: Payer: Self-pay | Admitting: Urology

## 2023-10-20 VITALS — BP 155/77 | HR 68

## 2023-10-20 DIAGNOSIS — N952 Postmenopausal atrophic vaginitis: Secondary | ICD-10-CM

## 2023-10-20 DIAGNOSIS — R399 Unspecified symptoms and signs involving the genitourinary system: Secondary | ICD-10-CM

## 2023-10-20 DIAGNOSIS — N3281 Overactive bladder: Secondary | ICD-10-CM | POA: Diagnosis not present

## 2023-10-20 LAB — BLADDER SCAN AMB NON-IMAGING

## 2023-10-20 MED ORDER — MIRABEGRON ER 50 MG PO TB24
50.0000 mg | ORAL_TABLET | Freq: Every day | ORAL | Status: AC
Start: 2023-10-20 — End: ?

## 2023-10-22 ENCOUNTER — Ambulatory Visit: Payer: Medicare HMO

## 2023-10-22 DIAGNOSIS — R29898 Other symptoms and signs involving the musculoskeletal system: Secondary | ICD-10-CM

## 2023-10-22 DIAGNOSIS — M6281 Muscle weakness (generalized): Secondary | ICD-10-CM

## 2023-10-22 DIAGNOSIS — I6930 Unspecified sequelae of cerebral infarction: Secondary | ICD-10-CM | POA: Diagnosis not present

## 2023-10-22 NOTE — Therapy (Signed)
OUTPATIENT PHYSICAL THERAPY NEURO TREATMENT   Patient Name: Yvette [name] MRN: [id number] DOB:1946-08-07, 78 y.o., female Today's Date: 10/22/2023   PCP: Dr. Althea Charon, DO REFERRING PROVIDER: Nicki Reaper, NP  END OF SESSION:09/10/23  PT End of Session - 10/22/23 1414     Visit Number 7    Number of Visits 13    Date for PT Re-Evaluation 10/28/23    Authorization Type 2x/week x 6 weeks (10/27/22); Medicare PN at visit #10    PT Start Time 1415    PT Stop Time 1500    PT Time Calculation (min) 45 min    Equipment Utilized During Treatment Gait belt    Activity Tolerance Patient tolerated treatment well    Behavior During Therapy WFL for tasks assessed/performed             Past Medical History:  Diagnosis Date   Arthropathy, unspecified, site unspecified    Esophageal reflux    Osteoarthritis    Osteoporosis, unspecified    Other abnormal glucose    Other and unspecified hyperlipidemia    Symptomatic menopausal or female climacteric states    Syncope and collapse    Past Surgical History:  Procedure Laterality Date   ABDOMINAL HYSTERECTOMY     APPENDECTOMY     BREAST BIOPSY Left 1970's   neg   COLONOSCOPY     CYST REMOVAL TRUNK  11/24/2020   from back   FINGER FRACTURE SURGERY     Patient Active Problem List   Diagnosis Date Noted   History of cerebrovascular accident (CVA) with residual deficit 08/28/2023   Left hip pain 06/19/2022   Lumbar radiculopathy 04/02/2022   Gait abnormality 04/02/2022   Urinary urgency 04/02/2022   Familial hyperlipidemia 12/07/2020   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   Abscess 04/07/2020   Venous insufficiency of both lower extremities 12/01/2019   Suspected sleep apnea 07/13/2018   Former smoker 07/13/2018   Multiple renal cysts 11/12/2017   Vitamin D deficiency 10/15/2017   Spondylosis of lumbar region without myelopathy or radiculopathy 06/10/2017   Chronic right-sided low back pain with sciatica 06/10/2017   Primary  osteoarthritis involving multiple joints 06/10/2017   BPPV (benign paroxysmal positional vertigo), right 06/10/2017   Nausea without vomiting 06/10/2017   Environmental and seasonal allergies 02/12/2017   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/2017   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   Dupuytren's contracture of left hand 10/14/2016   Anxiety disorder 09/03/2016   GERD (gastroesophageal reflux disease) 09/03/2016   Prophylactic use of raloxifene ([place]) 09/03/2016   Hyperlipidemia associated with type 2 diabetes mellitus (HCC) 09/03/2016   Type 2 diabetes mellitus with other specified complication (HCC) 09/12/2015   Dyspnea 07/23/2013   Essential hypertension     ONSET DATE: October 2024  REFERRING DIAG: R29.898 (ICD-10-CM) - Bilateral leg weakness   I69.30 (ICD-10-CM) - History of cerebrovascular accident (CVA) with residual deficit   THERAPY DIAG:  Weakness of both lower extremities  Muscle weakness (generalized)  Rationale for Evaluation and Treatment: Rehabilitation  SUBJECTIVE: From initial evaluation on 09/08/23  SUBJECTIVE STATEMENT: Pt reports at end of October, 2024 (does not recall exact day) she was getting into bed and she felt like she stepped up on the step stool to get into bed and when she turned around she felt off balance and fell onto the floor. She reports she had difficulty moving her L LE and it felt a little weaker.  She reports she did not injure anything.  She crawled to her cedar chest and used that to push herself up from and used her R leg to stand up.  She reports she did not call any family or neighbors and she slept in the recliner chair that day.    She called her PCP the next week (few days later) to check in.  She was evaluated by PCP and referred to PT. Pt states she was  evaluated and referred to PT because the "episode" was a stroke.  She has not seen any other providers for this.  Current function: intermittently she feels like she is "dragging her L leg" since this incident.  Her L foot has caught a few times but she has not fallen since then.    She is now amb with SPC, she reports she did not use the [facility] prior to this "episode".  She feels like it is helpful when she gets tired.    Pt accompanied by: self; she states she has family that live close by (daughter)  PERTINENT HISTORY: pt reports she has h/o LBP (DJD)  and difficulty with prolonged standing/walking  intermittently; osteopenia; and h/o stable L hip fx; unclear which hip though- upon chart review the xray results from 03/2022 states stable R hip fx on impression; pt states she has seen orthopedist for this and opted to not pursue surgery.  PAIN:  Are you having pain? Not today  PRECAUTIONS: Fall  RED FLAGS: None   WEIGHT BEARING RESTRICTIONS: No  FALLS: Has patient fallen in last 6 months? Yes, 1x (see above)  LIVING ENVIRONMENT: Lives with: lives with their family and lives alone, pt is a widow; daughter "Arline Asp" lives 4 miles; has a Nurse, mental health at home Lives in: [employer]: 2 steps to enter home on deck, with railings on back of home; has 5 steps in front of home with railing  Has following equipment at home: Single point cane  PLOF: Independent  PATIENT GOALS: to be able to walk longer and with improved balance inside and outside of her home  OBJECTIVE:  Note: Objective measures were completed at Evaluation unless otherwise noted.  DIAGNOSTIC FINDINGS: per chart review: brain MRI on 08/07/23: FINDINGS: Brain: There is a 1.3 cm acute infarct in the right corona radiata. Chronic infarcts are noted in the left corona radiata, right basal ganglia, and both thalami. Small T2 hyperintensities elsewhere in the cerebral white matter bilaterally are nonspecific but compatible with  mild chronic small vessel ischemic disease. There is a single chronic microhemorrhage in the right cerebellar hemisphere. No mass, midline shift, or extra-axial fluid collection is identified. There is mild generalized cerebral atrophy. IMPRESSION: 1. Acute infarct in the right corona radiata. 2. 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    COGNITION: Overall cognitive status: Within functional limits for tasks assessed   SENSATION: WFL  COORDINATION: Pt demonstrated normal coordination with finger to nose with PT and LE heel/shin motion  POSTURE: rounded shoulders  LOWER EXTREMITY ROM:     Active  Right Eval Left Eval  Hip flexion [facility]  for sit to stand and amb [facility] for sit to stand and amb  Hip extension    Hip abduction    Hip adduction    Hip internal rotation    Hip external rotation    Knee flexion    Knee extension    Ankle dorsiflexion    Ankle plantarflexion    Ankle inversion    Ankle eversion     (Blank rows = not tested)  LOWER EXTREMITY MMT:    MMT Right Eval Left Eval  Hip flexion 5 4  Hip extension    Hip abduction 4 4  Hip adduction    Hip internal rotation 4+ 4  Hip external rotation 4+ 4  Knee flexion 5 4  Knee extension 5 4  Ankle dorsiflexion 5 4  Ankle plantarflexion    Ankle inversion    Ankle eversion    (Blank rows = not tested)  BED MOBILITY:  Will be assessed at future visit  TRANSFERS: Assistive device utilized: SPC during amb Sit to stand: Complete Independence Stand to sit: Complete Independence Chair to chair: able to without SPC, uses b/l UE support Floor: deferred today  STAIRS: Will assess at future visit  GAIT: Gait pattern: wide BOS and poor foot clearance- Left Distance walked: pt amb in/out of clinic independently Assistive device utilized: Single point cane Level of assistance: Complete Independence Comments: pt amb with decreased gait speed; states she feels fatigued  more easily during amb than prior to October  FUNCTIONAL TESTS:  5 times sit to stand: 26 seconds (indicating increased fall risk); pt uses UE support Additional balance testing will be administered at future visit  PATIENT SURVEYS:  FOTO will be administered at future visit; clinic lost electricity/internet during pt's appointment today  TODAY'S TREATMENT:                                                                                                                              DATE: 10/22/23  Subjective: pt reports she had a few good days since last PT session; she has had a few days where her legs felt weak.  Today she reports her hips are sore and she feels like she has to concentrate when walking because her legs feel weak. She felt like her stomach is feeling a little off today, but no nausea or vomiting or diarrhea.  No numbness/parasthesias.  She started a new OAB medicaiton this week.  No other changes in her routine or health.  No falls.  The grab bars arrived in the mail and her family will help her install soon.     Pain: none reported today 3-4/10 L hip today  Objective: Vital signs upon arrival: BP 154/64, HR 60  Therapeutic Exercises: Nustep: level 2 x 10 minutes, focus on LE strength to address her impairment of decreased walking endurance after previous sessions 6 min walk test attempt.  In parallel bars: Mini squats with chair behind her 3x5  Standing marches 2x10 Heel raises 2x10 Seated marches 2x10 Seated scapular retractions 2x10 with PT verbal/tactile cues  Seated shoulder flexion with PT verbal cues for upright posture Front step ups: x10 R and x10 L with b/l UE support Amb x 4 min with SPC at end and PT supervision on flat surface to car- PT verbal cues to "stand tall"  Not today Sit to stand 3x5 from chair with airex, focused on reducing UE use, verbal cues to "stand tall" and "lower slowly"- not today LAQ: R 4# 2x10, L 3# 2x10- not today Alternating marches  2x10 same weight as above, seated, x15 ea Standing marches in parallel bars: 3 laps in parallel bars Hurdles: forward 2 laps in parallel bars Hurdles: side stepping in parallel bars 2 laps Standing heel raises: x25 Standing foot taps alternating onto 6 inch step: x15 ea with b/l UE support, verbal cues to "lift leg"  Pt education: reviewed the type of grab bar (stainless steel type) to have installed via hardware, not suction cups to promote improved safety with transfers in/out of her shower which has tub to step over   Not today: Seated toe raises: 2x15 6 min walk test: stopped at 5:40 today due to pt reporting LE L>R fatigue (340 ft) with SPC and PT supervision Seated hip abd: 5 sec x 15 green tb- not today- HEP Seated hip add: 5 sec x 15 manual resistance, ball- not today, HEP  HEP Access Code: [email or web address] URL: [email or web address] Date: 10/06/2023 Prepared by: Max Fickle  Exercises - Seated Hip Adduction Isometrics with Newman Pies  - 1 x daily - 7 x weekly - 2 sets - 10 reps - 5 hold - Seated Hip Abduction with Resistance  - 1 x daily - 7 x weekly - 2 sets - 10 reps - 5 hold - Seated March  - 1 x daily - 7 x weekly - 2 sets - 10 reps - Seated Heel Toe Raises  - 2 x daily - 7 x weekly - 2 sets - 15 reps - Sit to Stand  - 2 x daily - 7 x weekly - 2 sets - 5 reps   PATIENT EDUCATION: Education details: PT POC/goals, purpose of PT to address strength/balance/gait impairments and to reduce fall risk/improve safety during amb and ADLs at home Person educated: Patient Education method: Explanation Education comprehension: verbalized understanding  HOME EXERCISE PROGRAM: [email or web address]  GOALS: Goals reviewed with patient? Yes  SHORT TERM GOALS: Target date: 10/07/23  Pt will be instructed on strategies to improve home enrivonment safety during amb at home to promote safe independent living with reduced risk for falls (lighting, throw rugs, obstacles, furniture  placement) Baseline: initiated during evaluation today Goal status: INITIAL   LONG TERM GOALS: Target date: 10/27/22  Administer FOTO at next visit and set appropriate FOTO goal Baseline: will be set at visit #2 Goal status: INITIAL  2.  Improve L LE strength 1/2 MMT grade to promote improved ability to amb without SPC at PLOF for short distances around her home Baseline: using SPC for amb at self selected height, has not been properly fitted yet; PT adjusted height of SPC to appropriate level today at end of evaluation appointment Goal status: INITIAL  3.  Pt will be able to peform 5x STS in <15 seconds indicating reduced risk for falling Baseline: 26 seconds Goal status: INITIAL    ASSESSMENT:  CLINICAL IMPRESSION: PT reduced intensity of today's session as pt's stamina and endurance was not as strong as  previous session.  Required a few more rest breaks than usual during session.  Vital signs were within normal range for exercise; pt denies fever/nausea/parasthesias; the only change she recalls is she started a new medication for OAB this week  Incorporated postural mm retraining to promote improved upright trunk posture and improved biomechanics for ambulation as pt tends to amb with trunk and hip flexion when she fatigues.  Overall, she completed tx today; will attempt to resume full therapeutic exercise program again at next visit.  Overall still lacks sufficient and strength and endurance in her lower extremities for getting up from chair without UE support and walking longer than a few minutes.  She should continue to benefit from consistent skilled PT to improve strength, balance, and walking tolerance to facilitate improved safety during ambulation and improved ability to perform her daily activities at [facility] before her CVA.  OBJECTIVE IMPAIRMENTS: Abnormal gait, decreased activity tolerance, decreased balance, decreased endurance, decreased mobility, difficulty walking, decreased  strength, decreased safety awareness, and impaired perceived functional ability.   ACTIVITY LIMITATIONS: carrying, lifting, standing, squatting, stairs, and locomotion level  PARTICIPATION LIMITATIONS: meal prep, cleaning, laundry, interpersonal relationship, shopping, and community activity  PERSONAL FACTORS: Time since onset of injury/illness/exacerbation and 1-2 comorbidities: h/o lumbar DJD and possible h/o hip fx (unclear from pt report today)  are also affecting patient's functional outcome.   REHAB POTENTIAL: Good  CLINICAL DECISION MAKING: Stable/uncomplicated  EVALUATION COMPLEXITY: Low  PLAN:  PT FREQUENCY: 2x/week  PT DURATION: 6 weeks  PLANNED INTERVENTIONS: 97110-Therapeutic exercises, 97530- Therapeutic activity, 97112- Neuromuscular re-education, 97535- Self Care, and 16109- Manual therapy  PLAN FOR NEXT SESSION: LE strengthening; BERG; continue focusing on sit to stand, step ups, monitor BP at start of tx; follow up on grab bar; practice car transfers to improve safety with this activity (she is borrowing a family member's vehicle); PN needed at next visit  Max Fickle, PT, DPT, OCS Ardine Bjork, PT 10/22/2023, 2:15 PM

## 2023-10-24 NOTE — Telephone Encounter (Signed)
Copied from CRM [id number]. Topic: General - Other >> Oct 24, 2023  9:55 AM Macon Large wrote: Reason for CRM: Pt reports that the REPATHA SURECLICK 140 MG/ML SOAJ pen malfunctioned so she contacted [employer]. Pt stated that she was told to contact her provider and have the provider contact them at ph# [phone] case# [phone]. Pt stated that the Rx will be replaced by [facility] ph# [phone]. Pt requests call back to advise how this could effect her.

## 2023-10-24 NOTE — Telephone Encounter (Signed)
Spoke with patient. [employer] is sending her a replacement for the failed pen. The company will need information from our office but patient stated they will contact us.

## 2023-10-27 ENCOUNTER — Ambulatory Visit: Payer: Medicare HMO

## 2023-10-27 ENCOUNTER — Telehealth: Payer: Self-pay | Admitting: Neurology

## 2023-10-27 ENCOUNTER — Telehealth: Payer: Self-pay | Admitting: Family Medicine

## 2023-10-27 DIAGNOSIS — I6930 Unspecified sequelae of cerebral infarction: Secondary | ICD-10-CM | POA: Diagnosis not present

## 2023-10-27 DIAGNOSIS — R29898 Other symptoms and signs involving the musculoskeletal system: Secondary | ICD-10-CM

## 2023-10-27 DIAGNOSIS — M6281 Muscle weakness (generalized): Secondary | ICD-10-CM | POA: Diagnosis not present

## 2023-10-27 NOTE — Telephone Encounter (Signed)
Noted. No sooner appt at this time. Is on waitlist

## 2023-10-27 NOTE — Therapy (Signed)
OUTPATIENT PHYSICAL THERAPY NEURO TREATMENT   Patient Name: Yvette [name] MRN: [id number] DOB:1946-03-28, 78 y.o., female Today's Date: 10/27/2023   PCP: Dr. Althea Charon, DO REFERRING PROVIDER: Nicki Reaper, NP  END OF SESSION:09/10/23  PT End of Session - 10/27/23 1422     Visit Number 8    Number of Visits 13    Date for PT Re-Evaluation 10/28/23    Authorization Type 2x/week x 6 weeks (10/27/22); Medicare PN at visit #10    PT Start Time 1420    PT Stop Time 1500    PT Time Calculation (min) 40 min    Equipment Utilized During Treatment Gait belt    Activity Tolerance Patient tolerated treatment well    Behavior During Therapy WFL for tasks assessed/performed             Past Medical History:  Diagnosis Date   Arthropathy, unspecified, site unspecified    Esophageal reflux    Osteoarthritis    Osteoporosis, unspecified    Other abnormal glucose    Other and unspecified hyperlipidemia    Symptomatic menopausal or female climacteric states    Syncope and collapse    Past Surgical History:  Procedure Laterality Date   ABDOMINAL HYSTERECTOMY     APPENDECTOMY     BREAST BIOPSY Left 1970's   neg   COLONOSCOPY     CYST REMOVAL TRUNK  11/24/2020   from back   FINGER FRACTURE SURGERY     Patient Active Problem List   Diagnosis Date Noted   History of cerebrovascular accident (CVA) with residual deficit 08/28/2023   Left hip pain 06/19/2022   Lumbar radiculopathy 04/02/2022   Gait abnormality 04/02/2022   Urinary urgency 04/02/2022   Familial hyperlipidemia 12/07/2020   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   Abscess 04/07/2020   Venous insufficiency of both lower extremities 12/01/2019   Suspected sleep apnea 07/13/2018   Former smoker 07/13/2018   Multiple renal cysts 11/12/2017   Vitamin D deficiency 10/15/2017   Spondylosis of lumbar region without myelopathy or radiculopathy 06/10/2017   Chronic right-sided low back pain with sciatica 06/10/2017   Primary  osteoarthritis involving multiple joints 06/10/2017   BPPV (benign paroxysmal positional vertigo), right 06/10/2017   Nausea without vomiting 06/10/2017   Environmental and seasonal allergies 02/12/2017   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/2017   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   Dupuytren's contracture of left hand 10/14/2016   Anxiety disorder 09/03/2016   GERD (gastroesophageal reflux disease) 09/03/2016   Prophylactic use of raloxifene ([place]) 09/03/2016   Hyperlipidemia associated with type 2 diabetes mellitus (HCC) 09/03/2016   Type 2 diabetes mellitus with other specified complication (HCC) 09/12/2015   Dyspnea 07/23/2013   Essential hypertension     ONSET DATE: October 2024  REFERRING DIAG: R29.898 (ICD-10-CM) - Bilateral leg weakness   I69.30 (ICD-10-CM) - History of cerebrovascular accident (CVA) with residual deficit   THERAPY DIAG:  Weakness of both lower extremities  Muscle weakness (generalized)  Rationale for Evaluation and Treatment: Rehabilitation  SUBJECTIVE: From initial evaluation on 09/08/23  SUBJECTIVE STATEMENT: Pt reports at end of October, 2024 (does not recall exact day) she was getting into bed and she felt like she stepped up on the step stool to get into bed and when she turned around she felt off balance and fell onto the floor. She reports she had difficulty moving her L LE and it felt a little weaker.  She reports she did not injure anything.  She crawled to her cedar chest and used that to push herself up from and used her R leg to stand up.  She reports she did not call any family or neighbors and she slept in the recliner chair that day.    She called her PCP the next week (few days later) to check in.  She was evaluated by PCP and referred to PT. Pt states she was  evaluated and referred to PT because the "episode" was a stroke.  She has not seen any other providers for this.  Current function: intermittently she feels like she is "dragging her L leg" since this incident.  Her L foot has caught a few times but she has not fallen since then.    She is now amb with SPC, she reports she did not use the [facility] prior to this "episode".  She feels like it is helpful when she gets tired.    Pt accompanied by: self; she states she has family that live close by (daughter)  PERTINENT HISTORY: pt reports she has h/o LBP (DJD)  and difficulty with prolonged standing/walking  intermittently; osteopenia; and h/o stable L hip fx; unclear which hip though- upon chart review the xray results from 03/2022 states stable R hip fx on impression; pt states she has seen orthopedist for this and opted to not pursue surgery.  PAIN:  Are you having pain? Not today  PRECAUTIONS: Fall  RED FLAGS: None   WEIGHT BEARING RESTRICTIONS: No  FALLS: Has patient fallen in last 6 months? Yes, 1x (see above)  LIVING ENVIRONMENT: Lives with: lives with their family and lives alone, pt is a widow; daughter "Arline Asp" lives 4 miles; has a Nurse, mental health at home Lives in: [employer]: 2 steps to enter home on deck, with railings on back of home; has 5 steps in front of home with railing  Has following equipment at home: Single point cane  PLOF: Independent  PATIENT GOALS: to be able to walk longer and with improved balance inside and outside of her home  OBJECTIVE:  Note: Objective measures were completed at Evaluation unless otherwise noted.  DIAGNOSTIC FINDINGS: per chart review: brain MRI on 08/07/23: FINDINGS: Brain: There is a 1.3 cm acute infarct in the right corona radiata. Chronic infarcts are noted in the left corona radiata, right basal ganglia, and both thalami. Small T2 hyperintensities elsewhere in the cerebral white matter bilaterally are nonspecific but compatible with  mild chronic small vessel ischemic disease. There is a single chronic microhemorrhage in the right cerebellar hemisphere. No mass, midline shift, or extra-axial fluid collection is identified. There is mild generalized cerebral atrophy. IMPRESSION: 1. Acute infarct in the right corona radiata. 2. 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    COGNITION: Overall cognitive status: Within functional limits for tasks assessed   SENSATION: WFL  COORDINATION: Pt demonstrated normal coordination with finger to nose with PT and LE heel/shin motion  POSTURE: rounded shoulders  LOWER EXTREMITY ROM:     Active  Right Eval Left Eval  Hip flexion [facility]  for sit to stand and amb [facility] for sit to stand and amb  Hip extension    Hip abduction    Hip adduction    Hip internal rotation    Hip external rotation    Knee flexion    Knee extension    Ankle dorsiflexion    Ankle plantarflexion    Ankle inversion    Ankle eversion     (Blank rows = not tested)  LOWER EXTREMITY MMT:    MMT Right Eval Left Eval  Hip flexion 5 4  Hip extension    Hip abduction 4 4  Hip adduction    Hip internal rotation 4+ 4  Hip external rotation 4+ 4  Knee flexion 5 4  Knee extension 5 4  Ankle dorsiflexion 5 4  Ankle plantarflexion    Ankle inversion    Ankle eversion    (Blank rows = not tested)  BED MOBILITY:  Will be assessed at future visit  TRANSFERS: Assistive device utilized: SPC during amb Sit to stand: Complete Independence Stand to sit: Complete Independence Chair to chair: able to without SPC, uses b/l UE support Floor: deferred today  STAIRS: Will assess at future visit  GAIT: Gait pattern: wide BOS and poor foot clearance- Left Distance walked: pt amb in/out of clinic independently Assistive device utilized: Single point cane Level of assistance: Complete Independence Comments: pt amb with decreased gait speed; states she feels fatigued  more easily during amb than prior to October  FUNCTIONAL TESTS:  5 times sit to stand: 26 seconds (indicating increased fall risk); pt uses UE support Additional balance testing will be administered at future visit  PATIENT SURVEYS:  FOTO will be administered at future visit; clinic lost electricity/internet during pt's appointment today  TODAY'S TREATMENT:                                                                                                                              DATE: 10/27/23  Subjective: pt reports she is feeling better than last session; her legs feel weak but she is not feeling sick today.  Pain: none reported today 3-4/10 L hip today  Objective: Grip strength: R hand 58 lb R and 52 lb L  Therapeutic Exercises: Nustep: level 2 x 10 minutes, focus on LE strength to address her impairment of decreased walking endurance after previous sessions 6 min walk test attempt- not today  Mini squats with chair behind her 2x8 Standing marches 2x10 LAQ: R 4# 2x10, L 3# 2x10 Toe raises: 2x10 Seated marches 2x10 Sit to stand: x5 with chair + airex Heel raises: 2x10 Standing toe taps on steps: x15 alternating, PT cues for "stand tall" Standing scapular retractions 2x10 with PT verbal/tactile cues  Seated shoulder flexion with PT verbal cues for upright posture Hurdles: forward 2 laps in parallel bars  Not today: Sit to stand 2x5 from chair with airex, focused on reducing UE use, verbal cues to "stand  tall" and "lower slowly" Alternating marches 2x10 same weight as above, seated, x15 ea Standing marches in parallel bars: 3 laps in parallel bars Hurdles: side stepping in parallel bars 2 laps Front step ups: x10 R and x10 L with b/l UE support Amb x 4 min with SPC at end and PT supervision on flat surface to car- PT verbal cues to "stand tall"  Pt education: reviewed upright posture during ambulation today with cues for "stand tall"   Not today: Seated toe raises:  2x15 6 min walk test: stopped at 5:40 today due to pt reporting LE L>R fatigue (340 ft) with SPC and PT supervision Seated hip abd: 5 sec x 15 green tb- not today- HEP Seated hip add: 5 sec x 15 manual resistance, ball- not today, HEP  HEP Access Code: [email or web address] URL: [email or web address] Date: 10/06/2023 Prepared by: Max Fickle  Exercises - Seated Hip Adduction Isometrics with Newman Pies  - 1 x daily - 7 x weekly - 2 sets - 10 reps - 5 hold - Seated Hip Abduction with Resistance  - 1 x daily - 7 x weekly - 2 sets - 10 reps - 5 hold - Seated March  - 1 x daily - 7 x weekly - 2 sets - 10 reps - Seated Heel Toe Raises  - 2 x daily - 7 x weekly - 2 sets - 15 reps - Sit to Stand  - 2 x daily - 7 x weekly - 2 sets - 5 reps   PATIENT EDUCATION: Education details: PT POC/goals, purpose of PT to address strength/balance/gait impairments and to reduce fall risk/improve safety during amb and ADLs at home Person educated: Patient Education method: Explanation Education comprehension: verbalized understanding  HOME EXERCISE PROGRAM: [email or web address]  GOALS: Goals reviewed with patient? Yes  SHORT TERM GOALS: Target date: 10/07/23  Pt will be instructed on strategies to improve home enrivonment safety during amb at home to promote safe independent living with reduced risk for falls (lighting, throw rugs, obstacles, furniture placement) Baseline: initiated during evaluation today Goal status: INITIAL   LONG TERM GOALS: Target date: 10/27/22  Administer FOTO at next visit and set appropriate FOTO goal Baseline: will be set at visit #2 Goal status: INITIAL  2.  Improve L LE strength 1/2 MMT grade to promote improved ability to amb without SPC at PLOF for short distances around her home Baseline: using SPC for amb at self selected height, has not been properly fitted yet; PT adjusted height of SPC to appropriate level today at end of evaluation appointment Goal status: INITIAL  3.   Pt will be able to peform 5x STS in <15 seconds indicating reduced risk for falling Baseline: 26 seconds Goal status: INITIAL    ASSESSMENT:  CLINICAL IMPRESSION: ***PT reduced intensity of today's session as pt's stamina and endurance was not as strong as previous session.  Required a few more rest breaks than usual during session.  Vital signs were within normal range for exercise; pt denies fever/nausea/parasthesias; the only change she recalls is she started a new medication for OAB this week  Incorporated postural mm retraining to promote improved upright trunk posture and improved biomechanics for ambulation as pt tends to amb with trunk and hip flexion when she fatigues.  Overall, she completed tx today; will attempt to resume full therapeutic exercise program again at next visit.  Overall still lacks sufficient and strength and endurance in her lower extremities for getting up from chair without UE support and walking longer  than a few minutes.  She should continue to benefit from consistent skilled PT to improve strength, balance, and walking tolerance to facilitate improved safety during ambulation and improved ability to perform her daily activities at [facility] before her CVA.  OBJECTIVE IMPAIRMENTS: Abnormal gait, decreased activity tolerance, decreased balance, decreased endurance, decreased mobility, difficulty walking, decreased strength, decreased safety awareness, and impaired perceived functional ability.   ACTIVITY LIMITATIONS: carrying, lifting, standing, squatting, stairs, and locomotion level  PARTICIPATION LIMITATIONS: meal prep, cleaning, laundry, interpersonal relationship, shopping, and community activity  PERSONAL FACTORS: Time since onset of injury/illness/exacerbation and 1-2 comorbidities: h/o lumbar DJD and possible h/o hip fx (unclear from pt report today)  are also affecting patient's functional outcome.   REHAB POTENTIAL: Good  CLINICAL DECISION MAKING:  Stable/uncomplicated  EVALUATION COMPLEXITY: Low  PLAN:  PT FREQUENCY: 2x/week  PT DURATION: 6 weeks  PLANNED INTERVENTIONS: 97110-Therapeutic exercises, 97530- Therapeutic activity, 97112- Neuromuscular re-education, 97535- Self Care, and 16606- Manual therapy  PLAN FOR NEXT SESSION: LE strengthening; BERG; continue focusing on sit to stand, step ups, monitor BP at start of tx; follow up on grab bar; practice car transfers to improve safety with this activity (she is borrowing a family member's vehicle); PN needed at next visit  Max Fickle, PT, DPT, OCS Ardine Bjork, PT 10/27/2023, 3:08 PM

## 2023-10-27 NOTE — Telephone Encounter (Signed)
Jarlene from [employer] called in states the med,raphatha pt received is defective and says needs verbal auth to send the pt a new one. Case # [id number]  and fx is  [phone] [id number]

## 2023-10-27 NOTE — Telephone Encounter (Signed)
Pt called to check on if there was an earlier appointment for Dr. Terrace Arabia. Pt stated,left foot is dragging, taking physical  therapy. Would like a earlier appointment to be seen.  Advise if have any strokes please call 911. Patient states,  looks for if speech changes,check facial expressions. Have not experienced anything feel need to call 911 or take myself to the hospital.  Informed patient if there is an earlier appointment available notify you.

## 2023-10-29 ENCOUNTER — Ambulatory Visit: Payer: Medicare HMO

## 2023-10-30 ENCOUNTER — Telehealth: Payer: Self-pay | Admitting: Family Medicine

## 2023-10-30 NOTE — Telephone Encounter (Signed)
[facility] called and spoke to Milnor, [facility] regarding this Repatha issue. She says the patient called and told her that she called the manufacturer and was told they needed something from the provider. Idalia Needle says she's going to call the manufacturer herself to see what can be done about the malfunctioning pen. She says the patient has a refill that can be picked up today, but she did lose money on the malfunctioned one. I advised I will send to the provider as a FYI to let him know if you need additional information regarding this from him, you will call the office back, she verbalized understanding.

## 2023-10-30 NOTE — Telephone Encounter (Signed)
Patient states when she tried to take her REPATHA injection last week the pen malfunctioned and was not able to take that dose. Patient states that she reached out to the manufacturer and they were unable to help. I advised patient I would send a message to the nurse for advice and in the meantime I recommended her reach out to [facility] to see if they were able to assist. Please advise.

## 2023-10-30 NOTE — Telephone Encounter (Signed)
Spoke to [employer] at speciality pharmacy. They have verbal orders from 10/27/23 and 10/29/23. And faxes concerning this replacement. We dont need to do anything further concerning this.

## 2023-10-30 NOTE — Telephone Encounter (Signed)
Idalia Needle with [facility] [employer] [facility] called back saying the speciality pharmacy( [facility] [phone]) they need a prescription for the Repatha case #  [id number]   they need a verbal order .

## 2023-11-03 ENCOUNTER — Ambulatory Visit: Payer: Medicare HMO

## 2023-11-03 DIAGNOSIS — R29898 Other symptoms and signs involving the musculoskeletal system: Secondary | ICD-10-CM | POA: Diagnosis not present

## 2023-11-03 DIAGNOSIS — I6930 Unspecified sequelae of cerebral infarction: Secondary | ICD-10-CM | POA: Diagnosis not present

## 2023-11-03 DIAGNOSIS — M6281 Muscle weakness (generalized): Secondary | ICD-10-CM | POA: Diagnosis not present

## 2023-11-03 NOTE — Therapy (Signed)
OUTPATIENT PHYSICAL THERAPY NEURO TREATMENT- PROGRESS NOTE/RE-CERT NOTE   Patient Name: Yvette [name] MRN: [id number] DOB:[birth year], 78 y.o., female Today's Date: 11/03/2023   PCP: Dr. Althea Charon, DO REFERRING PROVIDER: Nicki Reaper, NP  END OF SESSION:09/10/23  PT End of Session - 11/03/23 1412     Visit Number 9    Number of Visits 25    Date for PT Re-Evaluation 12/15/23    Authorization Type updated cert/PN done on 11/03/23- 2x/week x 6 weeks (12/15/23); Medicare PN at visit #20    PT Start Time 1415    PT Stop Time 1500    PT Time Calculation (min) 45 min    Equipment Utilized During Treatment Gait belt    Activity Tolerance Patient tolerated treatment well    Behavior During Therapy WFL for tasks assessed/performed             Past Medical History:  Diagnosis Date   Arthropathy, unspecified, site unspecified    Esophageal reflux    Osteoarthritis    Osteoporosis, unspecified    Other abnormal glucose    Other and unspecified hyperlipidemia    Symptomatic menopausal or female climacteric states    Syncope and collapse    Past Surgical History:  Procedure Laterality Date   ABDOMINAL HYSTERECTOMY     APPENDECTOMY     BREAST BIOPSY Left 1970's   neg   COLONOSCOPY     CYST REMOVAL TRUNK  11/24/2020   from back   FINGER FRACTURE SURGERY     Patient Active Problem List   Diagnosis Date Noted   History of cerebrovascular accident (CVA) with residual deficit 08/28/2023   Left hip pain 06/19/2022   Lumbar radiculopathy 04/02/2022   Gait abnormality 04/02/2022   Urinary urgency 04/02/2022   Familial hyperlipidemia 12/07/2020   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   Abscess 04/07/2020   Venous insufficiency of both lower extremities 12/01/2019   Suspected sleep apnea 07/13/2018   Former smoker 07/13/2018   Multiple renal cysts 11/12/2017   Vitamin D deficiency 10/15/2017   Spondylosis of lumbar region without myelopathy or radiculopathy 06/10/2017   Chronic  right-sided low back pain with sciatica 06/10/2017   Primary osteoarthritis involving multiple joints 06/10/2017   BPPV (benign paroxysmal positional vertigo), right 06/10/2017   Nausea without vomiting 06/10/2017   Environmental and seasonal allergies 02/12/2017   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/2017   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   Dupuytren's contracture of left hand 10/14/2016   Anxiety disorder 09/03/2016   GERD (gastroesophageal reflux disease) 09/03/2016   Prophylactic use of raloxifene ([place]) 09/03/2016   Hyperlipidemia associated with type 2 diabetes mellitus (HCC) 09/03/2016   Type 2 diabetes mellitus with other specified complication (HCC) 09/12/2015   Dyspnea 07/23/2013   Essential hypertension     ONSET DATE: October 2024  REFERRING DIAG: R29.898 (ICD-10-CM) - Bilateral leg weakness   I69.30 (ICD-10-CM) - History of cerebrovascular accident (CVA) with residual deficit   THERAPY DIAG:  Weakness of both lower extremities  Muscle weakness (generalized)  Rationale for Evaluation and Treatment: Rehabilitation  SUBJECTIVE: From initial evaluation on 09/08/23  SUBJECTIVE STATEMENT: Pt reports at end of October, 2024 (does not recall exact day) she was getting into bed and she felt like she stepped up on the step stool to get into bed and when she turned around she felt off balance and fell onto the floor. She reports she had difficulty moving her L LE and it felt a little weaker.  She reports she did not injure anything.  She crawled to her cedar chest and used that to push herself up from and used her R leg to stand up.  She reports she did not call any family or neighbors and she slept in the recliner chair that day.    She called her PCP the next week (few days later) to check in.  She was  evaluated by PCP and referred to PT. Pt states she was evaluated and referred to PT because the "episode" was a stroke.  She has not seen any other providers for this.  Current function: intermittently she feels like she is "dragging her L leg" since this incident.  Her L foot has caught a few times but she has not fallen since then.    She is now amb with SPC, she reports she did not use the [facility] prior to this "episode".  She feels like it is helpful when she gets tired.    Pt accompanied by: self; she states she has family that live close by (daughter)  PERTINENT HISTORY: pt reports she has h/o LBP (DJD)  and difficulty with prolonged standing/walking  intermittently; osteopenia; and h/o stable L hip fx; unclear which hip though- upon chart review the xray results from 03/2022 states stable R hip fx on impression; pt states she has seen orthopedist for this and opted to not pursue surgery.  PAIN:  Are you having pain? Not today  PRECAUTIONS: Fall  RED FLAGS: None   WEIGHT BEARING RESTRICTIONS: No  FALLS: Has patient fallen in last 6 months? Yes, 1x (see above)  LIVING ENVIRONMENT: Lives with: lives with their family and lives alone, pt is a widow; daughter "Arline Asp" lives 4 miles; has a Nurse, mental health at home Lives in: [employer]: 2 steps to enter home on deck, with railings on back of home; has 5 steps in front of home with railing  Has following equipment at home: Single point cane  PLOF: Independent  PATIENT GOALS: to be able to walk longer and with improved balance inside and outside of her home  OBJECTIVE:  Note: Objective measures were completed at Evaluation unless otherwise noted.  DIAGNOSTIC FINDINGS: per chart review: brain MRI on 08/07/23: FINDINGS: Brain: There is a 1.3 cm acute infarct in the right corona radiata. Chronic infarcts are noted in the left corona radiata, right basal ganglia, and both thalami. Small T2 hyperintensities elsewhere in the cerebral white  matter bilaterally are nonspecific but compatible with mild chronic small vessel ischemic disease. There is a single chronic microhemorrhage in the right cerebellar hemisphere. No mass, midline shift, or extra-axial fluid collection is identified. There is mild generalized cerebral atrophy. IMPRESSION: 1. Acute infarct in the right corona radiata. 2. 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    COGNITION: Overall cognitive status: Within functional limits for tasks assessed   SENSATION: WFL  COORDINATION: Pt demonstrated normal coordination with finger to nose with PT and LE heel/shin motion  POSTURE: rounded shoulders  LOWER EXTREMITY ROM:     Active  Right Eval Left Eval  Hip flexion [facility]  for sit to stand and amb [facility] for sit to stand and amb  Hip extension    Hip abduction    Hip adduction    Hip internal rotation    Hip external rotation    Knee flexion    Knee extension    Ankle dorsiflexion    Ankle plantarflexion    Ankle inversion    Ankle eversion     (Blank rows = not tested)  LOWER EXTREMITY MMT:    MMT Right Eval Left Eval  Hip flexion 5 4  Hip extension    Hip abduction 4 4  Hip adduction    Hip internal rotation 4+ 4  Hip external rotation 4+ 4  Knee flexion 5 4  Knee extension 5 4  Ankle dorsiflexion 5 4  Ankle plantarflexion    Ankle inversion    Ankle eversion    (Blank rows = not tested)  BED MOBILITY:  Will be assessed at future visit  TRANSFERS: Assistive device utilized: SPC during amb Sit to stand: Complete Independence Stand to sit: Complete Independence Chair to chair: able to without SPC, uses b/l UE support Floor: deferred today  STAIRS: Will assess at future visit  GAIT: Gait pattern: wide BOS and poor foot clearance- Left Distance walked: pt amb in/out of clinic independently Assistive device utilized: Single point cane Level of assistance: Complete Independence Comments: pt  amb with decreased gait speed; states she feels fatigued more easily during amb than prior to October  FUNCTIONAL TESTS:  5 times sit to stand: 26 seconds (indicating increased fall risk); pt uses UE support Additional balance testing will be administered at future visit  PATIENT SURVEYS:  FOTO will be administered at future visit; clinic lost electricity/internet during pt's appointment today  TODAY'S TREATMENT:                                                                                                                              DATE: 11/03/23  Subjective: pt reports overall she is feeling a little stronger since starting PT.  She reports she did not have any falls or near falls since last session.  Grab bar has been installed in her shower and this feels safer now.  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 and is able to get in/out more easily now  Pain: none in L hip today  Objective: *Updated goals today Grip strength: R hand 58 lb R and 52 lb L 5x sit to stand: 17 seconds (was 26 seconds)  BERG balance: next visit 6 MWT: deferred SLS: L 3 seconds, R 5 seconds MMT: L ankle DF 4/5, L knee ext 4/5, L hip flex 4-/5  Pt goal: to be able to walk longer distances   Therapeutic Exercises:  Nustep: level 2 x 10 minutes, focus on LE strength to address her impairment of decreased walking endurance after previous sessions 6 min walk test attempt  Sit to stand 3x5 from  chair with airex, focused on reducing UE use, verbal cues to "stand tall" and "lower slowly"  Seated marches L  2x10 5 lb on distal femur Seated LAQ 2x15 L 3 lb Seated ankle DF x20 Seated scapular retraction x 20 Standing SLS: x 3 ea side, trials, only able to stand R x 4-5 sec, L x 2-3 sec (PT supervision) Standing tandem stance x 5 trials 5-10 sec ea foot placement (PT supervision)  Amb x 4 min at end of tx session on flat surface with SPC and verbal cues to stand tall, pt able to notice and adjust when L foot  dragging occurred without cues today   Not today: Mini squats with chair behind her 2x8 Standing marches 2x10 LAQ: R 4# 2x10, L 3# 2x10 Seated marches 2x10 Toe raises seated: 2x10 Sit to stand: x5 with chair + airex Heel raises standing holding b/l railings at steps: 2x10 Standing toe taps on steps: x15 alternating, PT cues for "stand tall" Standing scapular retractions 2x10 with PT verbal/tactile cues  Seated shoulder flexion with PT verbal cues for upright posture Hurdles: forward 2 laps in parallel bars Alternating marches 2x10 same weight as above, seated, x15 ea Standing marches in parallel bars: 3 laps in parallel bars Hurdles: side stepping in parallel bars 2 laps Front step ups: x10 R and x10 L with b/l UE support Amb x 4 min with SPC at end and PT supervision on flat surface to car- PT verbal cues to "stand tall"  Pt education: reviewed upright posture during ambulation today with cues for "stand tall"   Not today: Seated toe raises: 2x15 6 min walk test: stopped at 5:40 today due to pt reporting LE L>R fatigue (340 ft) with SPC and PT supervision Seated hip abd: 5 sec x 15 green tb- not today- HEP Seated hip add: 5 sec x 15 manual resistance, ball- not today, HEP  HEP Access Code: [email or web address] URL: [email or web address] Date: 10/06/2023 Prepared by: Max Fickle  Exercises - Seated Hip Adduction Isometrics with Newman Pies  - 1 x daily - 7 x weekly - 2 sets - 10 reps - 5 hold - Seated Hip Abduction with Resistance  - 1 x daily - 7 x weekly - 2 sets - 10 reps - 5 hold - Seated March  - 1 x daily - 7 x weekly - 2 sets - 10 reps - Seated Heel Toe Raises  - 2 x daily - 7 x weekly - 2 sets - 15 reps - Sit to Stand  - 2 x daily - 7 x weekly - 2 sets - 5 reps   PATIENT EDUCATION: Education details: PT POC/goals, purpose of PT to address strength/balance/gait impairments and to reduce fall risk/improve safety during amb and ADLs at home Person educated:  Patient Education method: Explanation Education comprehension: verbalized understanding  HOME EXERCISE PROGRAM: [email or web address]  GOALS: Goals reviewed with patient? Yes  SHORT TERM GOALS: Target date: 10/07/23  Pt will be instructed on strategies to improve home enrivonment safety during amb at home to promote safe independent living with reduced risk for falls (lighting, throw rugs, obstacles, furniture placement) Baseline: initiated during evaluation today Goal status: INITIAL   LONG TERM GOALS: Target date: 10/27/22  Administer FOTO at next visit and set appropriate FOTO goal Baseline: will be set at visit #2- 11/03/23 DC this goal Goal status: DC this goal  2.  Improve L LE strength 1/2 MMT grade to promote improved ability to amb without SPC at [facility] for  short distances around her home Baseline: using SPC for amb at self selected height, has not been properly fitted yet; PT adjusted height of SPC to appropriate level today at end of evaluation appointment; 11/03/23: 4-/5 hip flex Goal status: INITIAL  3.  Pt will be able to peform 5x STS in <15 seconds indicating reduced risk for falling Baseline: 26 seconds; 11/03/23: 17 seconds Goal status: INITIAL   4. Pt will be able to amb x >6 min without taking a rest break in 6 MWT to facilitate improved ability to ambulate community distances in/out of stores, to/from her car, and with her dog for taking care of her dog independently.   Baseline: 5:40 min (340 ft)    Goal status: new  ASSESSMENT:  CLINICAL IMPRESSION: Pt is making slow steady progress with outpatient PT.  She demonstrates improvements in her ability to adjust gait when she notices L foot dragging during L swing phase.  She also demonstrates improvement with 5X sit to stand test- still uses momentum but relies less on UE support.  5x STS time to complete is still indicating a fall risk, but is trending in an ideal direction.  She reports modifications have been made to improve  her home safety, specifically for transfers in/out of her shower.  She still lacks LE strength L>R and endurance and this is contributing to her difficulty with walking and standing.  Still not able to complete 6 min walk test yet.  She should continue to benefit from consistent skilled PT to improve strength, balance, activity tolerance, and walking tolerance to facilitate improved safety during ambulation and improved ability to perform her daily activities at [facility] before her CVA.  OBJECTIVE IMPAIRMENTS: Abnormal gait, decreased activity tolerance, decreased balance, decreased endurance, decreased mobility, difficulty walking, decreased strength, decreased safety awareness, and impaired perceived functional ability.   ACTIVITY LIMITATIONS: carrying, lifting, standing, squatting, stairs, and locomotion level  PARTICIPATION LIMITATIONS: meal prep, cleaning, laundry, interpersonal relationship, shopping, and community activity  PERSONAL FACTORS: Time since onset of injury/illness/exacerbation and 1-2 comorbidities: h/o lumbar DJD and possible h/o hip fx (unclear from pt report today)  are also affecting patient's functional outcome.   REHAB POTENTIAL: Good  CLINICAL DECISION MAKING: Stable/uncomplicated  EVALUATION COMPLEXITY: Low  PLAN:  PT FREQUENCY: 2x/week  PT DURATION: 6 weeks  PLANNED INTERVENTIONS: 97110-Therapeutic exercises, 97530- Therapeutic activity, 97112- Neuromuscular re-education, 97535- Self Care, and 54098- Manual therapy  PLAN FOR NEXT SESSION: LE strengthening; BERG; continue focusing on sit to stand, step ups, monitor BP; continue working on walking endurance and standing endurance  Max Fickle, PT, DPT, OCS Ardine Bjork, PT 11/03/2023, 5:33 PM

## 2023-11-05 ENCOUNTER — Ambulatory Visit: Payer: Medicare HMO

## 2023-11-05 DIAGNOSIS — R29898 Other symptoms and signs involving the musculoskeletal system: Secondary | ICD-10-CM | POA: Diagnosis not present

## 2023-11-05 DIAGNOSIS — I6930 Unspecified sequelae of cerebral infarction: Secondary | ICD-10-CM | POA: Diagnosis not present

## 2023-11-05 DIAGNOSIS — M6281 Muscle weakness (generalized): Secondary | ICD-10-CM

## 2023-11-05 NOTE — Therapy (Signed)
OUTPATIENT PHYSICAL THERAPY NEURO TREATMENT-    Patient Name: Yvette [name] MRN: [id number] DOB:05/12/1946, 77 y.o., female Today's Date: 11/05/2023   PCP: Dr. Althea Charon, DO REFERRING PROVIDER: Nicki Reaper, NP  END OF SESSION:09/10/23  PT End of Session - 11/05/23 1413     Visit Number 10    Number of Visits 25    Date for PT Re-Evaluation 12/15/23    Authorization Type updated cert/PN done on 11/03/23- 2x/week x 6 weeks (12/15/23); Medicare PN at visit #20    PT Start Time 1415    PT Stop Time 1500    PT Time Calculation (min) 45 min    Equipment Utilized During Treatment Gait belt    Activity Tolerance Patient tolerated treatment well    Behavior During Therapy WFL for tasks assessed/performed             Past Medical History:  Diagnosis Date   Arthropathy, unspecified, site unspecified    Esophageal reflux    Osteoarthritis    Osteoporosis, unspecified    Other abnormal glucose    Other and unspecified hyperlipidemia    Symptomatic menopausal or female climacteric states    Syncope and collapse    Past Surgical History:  Procedure Laterality Date   ABDOMINAL HYSTERECTOMY     APPENDECTOMY     BREAST BIOPSY Left 1970's   neg   COLONOSCOPY     CYST REMOVAL TRUNK  11/24/2020   from back   FINGER FRACTURE SURGERY     Patient Active Problem List   Diagnosis Date Noted   History of cerebrovascular accident (CVA) with residual deficit 08/28/2023   Left hip pain 06/19/2022   Lumbar radiculopathy 04/02/2022   Gait abnormality 04/02/2022   Urinary urgency 04/02/2022   Familial hyperlipidemia 12/07/2020   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   Abscess 04/07/2020   Venous insufficiency of both lower extremities 12/01/2019   Suspected sleep apnea 07/13/2018   Former smoker 07/13/2018   Multiple renal cysts 11/12/2017   Vitamin D deficiency 10/15/2017   Spondylosis of lumbar region without myelopathy or radiculopathy 06/10/2017   Chronic right-sided low back pain  with sciatica 06/10/2017   Primary osteoarthritis involving multiple joints 06/10/2017   BPPV (benign paroxysmal positional vertigo), right 06/10/2017   Nausea without vomiting 06/10/2017   Environmental and seasonal allergies 02/12/2017   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/2017   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   Dupuytren's contracture of left hand 10/14/2016   Anxiety disorder 09/03/2016   GERD (gastroesophageal reflux disease) 09/03/2016   Prophylactic use of raloxifene ([place]) 09/03/2016   Hyperlipidemia associated with type 2 diabetes mellitus (HCC) 09/03/2016   Type 2 diabetes mellitus with other specified complication (HCC) 09/12/2015   Dyspnea 07/23/2013   Essential hypertension     ONSET DATE: October 2024  REFERRING DIAG: R29.898 (ICD-10-CM) - Bilateral leg weakness   I69.30 (ICD-10-CM) - History of cerebrovascular accident (CVA) with residual deficit   THERAPY DIAG:  Weakness of both lower extremities  Muscle weakness (generalized)  Rationale for Evaluation and Treatment: Rehabilitation  SUBJECTIVE: From initial evaluation on 09/08/23  SUBJECTIVE STATEMENT: Pt reports at end of October, 2024 (does not recall exact day) she was getting into bed and she felt like she stepped up on the step stool to get into bed and when she turned around she felt off balance and fell onto the floor. She reports she had difficulty moving her L LE and it felt a little weaker.  She reports she did not injure anything.  She crawled to her cedar chest and used that to push herself up from and used her R leg to stand up.  She reports she did not call any family or neighbors and she slept in the recliner chair that day.    She called her PCP the next week (few days later) to check in.  She was evaluated by PCP and  referred to PT. Pt states she was evaluated and referred to PT because the "episode" was a stroke.  She has not seen any other providers for this.  Current function: intermittently she feels like she is "dragging her L leg" since this incident.  Her L foot has caught a few times but she has not fallen since then.    She is now amb with SPC, she reports she did not use the [facility] prior to this "episode".  She feels like it is helpful when she gets tired.    Pt accompanied by: self; she states she has family that live close by (daughter)  PERTINENT HISTORY: pt reports she has h/o LBP (DJD)  and difficulty with prolonged standing/walking  intermittently; osteopenia; and h/o stable L hip fx; unclear which hip though- upon chart review the xray results from 03/2022 states stable R hip fx on impression; pt states she has seen orthopedist for this and opted to not pursue surgery.  PAIN:  Are you having pain? Not today  PRECAUTIONS: Fall  RED FLAGS: None   WEIGHT BEARING RESTRICTIONS: No  FALLS: Has patient fallen in last 6 months? Yes, 1x (see above)  LIVING ENVIRONMENT: Lives with: lives with their family and lives alone, pt is a widow; daughter "Arline Asp" lives 4 miles; has a Nurse, mental health at home Lives in: [employer]: 2 steps to enter home on deck, with railings on back of home; has 5 steps in front of home with railing  Has following equipment at home: Single point cane  PLOF: Independent  PATIENT GOALS: to be able to walk longer and with improved balance inside and outside of her home  OBJECTIVE:  Note: Objective measures were completed at Evaluation unless otherwise noted.  DIAGNOSTIC FINDINGS: per chart review: brain MRI on 08/07/23: FINDINGS: Brain: There is a 1.3 cm acute infarct in the right corona radiata. Chronic infarcts are noted in the left corona radiata, right basal ganglia, and both thalami. Small T2 hyperintensities elsewhere in the cerebral white matter bilaterally are  nonspecific but compatible with mild chronic small vessel ischemic disease. There is a single chronic microhemorrhage in the right cerebellar hemisphere. No mass, midline shift, or extra-axial fluid collection is identified. There is mild generalized cerebral atrophy. IMPRESSION: 1. Acute infarct in the right corona radiata. 2. 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    COGNITION: Overall cognitive status: Within functional limits for tasks assessed   SENSATION: WFL  COORDINATION: Pt demonstrated normal coordination with finger to nose with PT and LE heel/shin motion  POSTURE: rounded shoulders  LOWER EXTREMITY ROM:     Active  Right Eval Left Eval  Hip flexion [facility]  for sit to stand and amb [facility] for sit to stand and amb  Hip extension    Hip abduction    Hip adduction    Hip internal rotation    Hip external rotation    Knee flexion    Knee extension    Ankle dorsiflexion    Ankle plantarflexion    Ankle inversion    Ankle eversion     (Blank rows = not tested)  LOWER EXTREMITY MMT:    MMT Right Eval Left Eval  Hip flexion 5 4  Hip extension    Hip abduction 4 4  Hip adduction    Hip internal rotation 4+ 4  Hip external rotation 4+ 4  Knee flexion 5 4  Knee extension 5 4  Ankle dorsiflexion 5 4  Ankle plantarflexion    Ankle inversion    Ankle eversion    (Blank rows = not tested)  BED MOBILITY:  Will be assessed at future visit  TRANSFERS: Assistive device utilized: SPC during amb Sit to stand: Complete Independence Stand to sit: Complete Independence Chair to chair: able to without SPC, uses b/l UE support Floor: deferred today  STAIRS: Will assess at future visit  GAIT: Gait pattern: wide BOS and poor foot clearance- Left Distance walked: pt amb in/out of clinic independently Assistive device utilized: Single point cane Level of assistance: Complete Independence Comments: pt amb with decreased gait  speed; states she feels fatigued more easily during amb than prior to October  FUNCTIONAL TESTS:  5 times sit to stand: 26 seconds (indicating increased fall risk); pt uses UE support Additional balance testing will be administered at future visit  PATIENT SURVEYS:  FOTO will be administered at future visit; clinic lost electricity/internet during pt's appointment today  TODAY'S TREATMENT:                                                                                                                              DATE: 11/05/23  Subjective: pt reports she walked out to her car without her cane.  She feels a little off balance today.  No falls, but she did feel unsteady this morning at home.  Her hip is a little sore upon arrival.  Has an eye doctor appointment next week for routine eye exam.  Pain: 2/10 in L hip today  Objective: Pt goal: to be able to walk longer distances  Therapeutic Exercises:  Nustep: level 2-3 x 10 minutes, focus on LE strength to address her impairment of decreased walking endurance after previous sessions 6 min walk test attempt  BERG balance test today: 35/56   Sit to stand 3x5 from chair with airex, focused on reducing UE use, verbal cues to "stand tall" and "lower slowly"  Standing alternating foot taps on step: 2x8 (6 inch height)  Seated scapular retraction x 20  Standing SLS: x 3 ea side, trials, only able to stand R x 4-5 sec, L x 2-3 sec (  PT supervision)  Standing tandem stance x 5 trials 5-10 sec ea foot placement (PT supervision)  Amb x 5 min at end of tx session on flat surface with SPC and verbal cues to stand tall, pt able to notice and adjust when L foot dragging occurred without cues today  Not today: Mini squats with chair behind her 2x8 Standing marches 2x10 LAQ: R 4# 2x10, L 3# 2x10 Seated marches 2x10 Toe raises seated: 2x10 Sit to stand: x5 with chair + airex Heel raises standing holding b/l railings at steps: 2x10 Standing toe  taps on steps: x15 alternating, PT cues for "stand tall" Standing scapular retractions 2x10 with PT verbal/tactile cues  Seated shoulder flexion with PT verbal cues for upright posture Hurdles: forward 2 laps in parallel bars Alternating marches 2x10 same weight as above, seated, x15 ea Standing marches in parallel bars: 3 laps in parallel bars Hurdles: side stepping in parallel bars 2 laps Front step ups: x10 R and x10 L with b/l UE support Amb x 4 min with SPC at end and PT supervision on flat surface to car- PT verbal cues to "stand tall" Seated marches L  2x10 5 lb on distal femur Seated LAQ 2x15 L 3 lb Seated ankle DF x20 6 min walk test: stopped at 5:40 today due to pt reporting LE L>R fatigue (340 ft) with SPC and PT supervision  Pt education: reviewed upright posture during ambulation today with cues for "stand tall"   HEP Access Code: [email or web address] URL: [email or web address] Date: 10/06/2023 Prepared by: Max Fickle  Exercises - Seated Hip Adduction Isometrics with Newman Pies  - 1 x daily - 7 x weekly - 2 sets - 10 reps - 5 hold - Seated Hip Abduction with Resistance  - 1 x daily - 7 x weekly - 2 sets - 10 reps - 5 hold - Seated March  - 1 x daily - 7 x weekly - 2 sets - 10 reps - Seated Heel Toe Raises  - 2 x daily - 7 x weekly - 2 sets - 15 reps - Sit to Stand  - 2 x daily - 7 x weekly - 2 sets - 5 reps   PATIENT EDUCATION: Education details: PT POC/goals, purpose of PT to address strength/balance/gait impairments and to reduce fall risk/improve safety during amb and ADLs at home Person educated: Patient Education method: Explanation Education comprehension: verbalized understanding  HOME EXERCISE PROGRAM: [email or web address]  GOALS: Goals reviewed with patient? Yes  SHORT TERM GOALS: Target date: 10/07/23  Pt will be instructed on strategies to improve home enrivonment safety during amb at home to promote safe independent living with reduced risk for falls  (lighting, throw rugs, obstacles, furniture placement) Baseline: initiated during evaluation today Goal status: INITIAL   LONG TERM GOALS: Target date: 10/27/22  Administer FOTO at next visit and set appropriate FOTO goal Baseline: will be set at visit #2- 11/03/23 DC this goal Goal status: DC this goal  2.  Improve L LE strength 1/2 MMT grade to promote improved ability to amb without SPC at [facility] for short distances around her home Baseline: using SPC for amb at self selected height, has not been properly fitted yet; PT adjusted height of SPC to appropriate level today at end of evaluation appointment; 11/03/23: 4-/5 hip flex Goal status: INITIAL  3.  Pt will be able to peform 5x STS in <15 seconds indicating reduced risk for falling Baseline: 26 seconds; 11/03/23: 17 seconds Goal status: INITIAL   4. Pt  will be able to amb x >6 min without taking a rest break in 6 MWT to facilitate improved ability to ambulate community distances in/out of stores, to/from her car, and with her dog for taking care of her dog independently.   Baseline: 5:40 min (340 ft)    Goal status: new  5. Improve BERG to >49 indicating reduced risk for falls  Baseline: 35/56 (11/05/23)  Goal status: new ASSESSMENT:  CLINICAL IMPRESSION: Implemented BERG balance test today; pt's score is within the high risk for falls range; supporting the need for continued skilled PT interventions focusing on balance, LE strength to improve her safety during ambulation and normal daily activities.  Discussed the importance of continuing to use her [facility] for amb in and out of her home right now too.  She should continue to benefit from consistent skilled PT to improve strength, balance, activity tolerance, and walking tolerance to facilitate improved safety during ambulation and improved ability to perform her daily activities at [facility] before her CVA.  OBJECTIVE IMPAIRMENTS: Abnormal gait, decreased activity tolerance, decreased balance,  decreased endurance, decreased mobility, difficulty walking, decreased strength, decreased safety awareness, and impaired perceived functional ability.   ACTIVITY LIMITATIONS: carrying, lifting, standing, squatting, stairs, and locomotion level  PARTICIPATION LIMITATIONS: meal prep, cleaning, laundry, interpersonal relationship, shopping, and community activity  PERSONAL FACTORS: Time since onset of injury/illness/exacerbation and 1-2 comorbidities: h/o lumbar DJD and possible h/o hip fx (unclear from pt report today)  are also affecting patient's functional outcome.   REHAB POTENTIAL: Good  CLINICAL DECISION MAKING: Stable/uncomplicated  EVALUATION COMPLEXITY: Low  PLAN:  PT FREQUENCY: 2x/week  PT DURATION: 6 weeks  PLANNED INTERVENTIONS: 97110-Therapeutic exercises, 97530- Therapeutic activity, 97112- Neuromuscular re-education, 97535- Self Care, and 13086- Manual therapy  PLAN FOR NEXT SESSION: LE strengthening; balance exercises, continue focusing on sit to stand, step ups, monitor BP; continue working on walking endurance and standing endurance  Max Fickle, PT, DPT, OCS Ardine Bjork, PT 11/05/2023, 2:13 PM

## 2023-11-11 DIAGNOSIS — H5203 Hypermetropia, bilateral: Secondary | ICD-10-CM | POA: Diagnosis not present

## 2023-11-12 ENCOUNTER — Ambulatory Visit: Payer: Medicare HMO | Attending: Internal Medicine

## 2023-11-12 DIAGNOSIS — R29898 Other symptoms and signs involving the musculoskeletal system: Secondary | ICD-10-CM | POA: Diagnosis not present

## 2023-11-12 DIAGNOSIS — M6281 Muscle weakness (generalized): Secondary | ICD-10-CM | POA: Insufficient documentation

## 2023-11-12 NOTE — Therapy (Signed)
 OUTPATIENT PHYSICAL THERAPY NEURO TREATMENT-    Patient Name: Yvette [name] MRN: [id number] DOB:August 07, 1946, 78 y.o., female Today's Date: 11/12/2023   PCP: Dr. Edman, DO REFERRING PROVIDER: Angeline Laura, NP  END OF SESSION:09/10/23  PT End of Session - 11/12/23 1423     Visit Number 11    Number of Visits 25    Date for PT Re-Evaluation 12/15/23    Authorization Type updated cert/PN done on 11/03/23- 2x/week x 6 weeks (12/15/23); Medicare PN at visit #20    PT Start Time 1415    PT Stop Time 1500    PT Time Calculation (min) 45 min    Equipment Utilized During Treatment Gait belt    Activity Tolerance Patient tolerated treatment well    Behavior During Therapy WFL for tasks assessed/performed             Past Medical History:  Diagnosis Date   Arthropathy, unspecified, site unspecified    Esophageal reflux    Osteoarthritis    Osteoporosis, unspecified    Other abnormal glucose    Other and unspecified hyperlipidemia    Symptomatic menopausal or female climacteric states    Syncope and collapse    Past Surgical History:  Procedure Laterality Date   ABDOMINAL HYSTERECTOMY     APPENDECTOMY     BREAST BIOPSY Left 1970's   neg   COLONOSCOPY     CYST REMOVAL TRUNK  11/24/2020   from back   FINGER FRACTURE SURGERY     Patient Active Problem List   Diagnosis Date Noted   History of cerebrovascular accident (CVA) with residual deficit 08/28/2023   Left hip pain 06/19/2022   Lumbar radiculopathy 04/02/2022   Gait abnormality 04/02/2022   Urinary urgency 04/02/2022   Familial hyperlipidemia 12/07/2020   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   Abscess 04/07/2020   Venous insufficiency of both lower extremities 12/01/2019   Suspected sleep apnea 07/13/2018   Former smoker 07/13/2018   Multiple renal cysts 11/12/2017   Vitamin D  deficiency 10/15/2017   Spondylosis of lumbar region without myelopathy or radiculopathy 06/10/2017   Chronic right-sided low back pain  with sciatica 06/10/2017   Primary osteoarthritis involving multiple joints 06/10/2017   BPPV (benign paroxysmal positional vertigo), right 06/10/2017   Nausea without vomiting 06/10/2017   Environmental and seasonal allergies 02/12/2017   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/2017   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   Dupuytren's contracture of left hand 10/14/2016   Anxiety disorder 09/03/2016   GERD (gastroesophageal reflux disease) 09/03/2016   Prophylactic use of raloxifene  (Evista ) 09/03/2016   Hyperlipidemia associated with type 2 diabetes mellitus (HCC) 09/03/2016   Type 2 diabetes mellitus with other specified complication (HCC) 09/12/2015   Dyspnea 07/23/2013   Essential hypertension     ONSET DATE: October 2024  REFERRING DIAG: R29.898 (ICD-10-CM) - Bilateral leg weakness   I69.30 (ICD-10-CM) - History of cerebrovascular accident (CVA) with residual deficit   THERAPY DIAG:  Weakness of both lower extremities  Muscle weakness (generalized)  Rationale for Evaluation and Treatment: Rehabilitation  SUBJECTIVE: From initial evaluation on 09/08/23  SUBJECTIVE STATEMENT: Pt reports at end of October, 2024 (does not recall exact day) she was getting into bed and she felt like she stepped up on the step stool to get into bed and when she turned around she felt off balance and fell onto the floor. She reports she had difficulty moving her L LE and it felt a little weaker.  She reports she did not injure anything.  She crawled to her cedar chest and used that to push herself up from and used her R leg to stand up.  She reports she did not call any family or neighbors and she slept in the recliner chair that day.    She called her PCP the next week (few days later) to check in.  She was evaluated by PCP and  referred to PT. Pt states she was evaluated and referred to PT because the episode was a stroke.  She has not seen any other providers for this.  Current function: intermittently she feels like she is dragging her L leg since this incident.  Her L foot has caught a few times but she has not fallen since then.    She is now amb with SPC, she reports she did not use the [facility] prior to this episode.  She feels like it is helpful when she gets tired.    Pt accompanied by: self; she states she has family that live close by (daughter)  PERTINENT HISTORY: pt reports she has h/o LBP (DJD)  and difficulty with prolonged standing/walking  intermittently; osteopenia; and h/o stable L hip fx; unclear which hip though- upon chart review the xray results from 03/2022 states stable R hip fx on impression; pt states she has seen orthopedist for this and opted to not pursue surgery.  PAIN:  Are you having pain? Not today  PRECAUTIONS: Fall  RED FLAGS: None   WEIGHT BEARING RESTRICTIONS: No  FALLS: Has patient fallen in last 6 months? Yes, 1x (see above)  LIVING ENVIRONMENT: Lives with: lives with their family and lives alone, pt is a widow; daughter Dorthea lives 4 miles; has a nurse, mental health at home Lives in: House/apartment Stairs: 2 steps to enter home on deck, with railings on back of home; has 5 steps in front of home with railing  Has following equipment at home: Single point cane  PLOF: Independent  PATIENT GOALS: to be able to walk longer and with improved balance inside and outside of her home  OBJECTIVE:  Note: Objective measures were completed at Evaluation unless otherwise noted.  DIAGNOSTIC FINDINGS: per chart review: brain MRI on 08/07/23: FINDINGS: Brain: There is a 1.3 cm acute infarct in the right corona radiata. Chronic infarcts are noted in the left corona radiata, right basal ganglia, and both thalami. Small T2 hyperintensities elsewhere in the cerebral white matter bilaterally are  nonspecific but compatible with mild chronic small vessel ischemic disease. There is a single chronic microhemorrhage in the right cerebellar hemisphere. No mass, midline shift, or extra-axial fluid collection is identified. There is mild generalized cerebral atrophy. IMPRESSION: 1. Acute infarct in the right corona radiata. 2. 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    COGNITION: Overall cognitive status: Within functional limits for tasks assessed   SENSATION: WFL  COORDINATION: Pt demonstrated normal coordination with finger to nose with PT and LE heel/shin motion  POSTURE: rounded shoulders  LOWER EXTREMITY ROM:     Active  Right Eval Left Eval  Hip flexion [facility]  for sit to stand and amb [facility] for sit to stand and amb  Hip extension    Hip abduction    Hip adduction    Hip internal rotation    Hip external rotation    Knee flexion    Knee extension    Ankle dorsiflexion    Ankle plantarflexion    Ankle inversion    Ankle eversion     (Blank rows = not tested)  LOWER EXTREMITY MMT:    MMT Right Eval Left Eval  Hip flexion 5 4  Hip extension    Hip abduction 4 4  Hip adduction    Hip internal rotation 4+ 4  Hip external rotation 4+ 4  Knee flexion 5 4  Knee extension 5 4  Ankle dorsiflexion 5 4  Ankle plantarflexion    Ankle inversion    Ankle eversion    (Blank rows = not tested)  BED MOBILITY:  Will be assessed at future visit  TRANSFERS: Assistive device utilized: SPC during amb Sit to stand: Complete Independence Stand to sit: Complete Independence Chair to chair: able to without SPC, uses b/l UE support Floor: deferred today  STAIRS: Will assess at future visit  GAIT: Gait pattern: wide BOS and poor foot clearance- Left Distance walked: pt amb in/out of clinic independently Assistive device utilized: Single point cane Level of assistance: Complete Independence Comments: pt amb with decreased gait  speed; states she feels fatigued more easily during amb than prior to October  FUNCTIONAL TESTS:  5 times sit to stand: 26 seconds (indicating increased fall risk); pt uses UE support Additional balance testing will be administered at future visit  PATIENT SURVEYS:  FOTO will be administered at future visit; clinic lost electricity/internet during pt's appointment today  TODAY'S TREATMENT:                                                                                                                              DATE: 11/12/23  Subjective: pt reports she feels off balance.  No falls.  She did not get a good night's sleep last night and arrives feeling tired.  She had a follow up with her eye doctor and states the cataracts are not ripe enough for surgical removal.  She states she did not need glasses at her appointment.    Pain: 2/10 in L hip today  Objective: Pt goal: to be able to walk longer distances  Therapeutic Exercises: Nustep: level 2-3 x 10 minutes, focus on LE strength to address her impairment of decreased walking endurance after previous sessions 6 min walk test attempt  Sit to stand 3x5 from chair with airex, focused on reducing UE use, verbal cues to stand tall and lower slowly  Standing scapular retraction x 20 with PT cues to stand tall  Standing heel raises: 15x in parallel bars  Standing marches: 2x10 ea in parallel bars  Side stepping along parallel bar 3 laps  Step over  obstacles (using single rail) - yoga blocks, shoe boxes 4 laps in parallel bars  Amb x 5 min at end of tx session on flat surface with SPC and verbal cues to stand tall, pt able to notice and adjust when L foot dragging occurred without cues today  Neuro re-ed: Standing SLS: x 3 ea side, trials, only able to stand R x 4-5 sec, L x 2-3 sec (PT supervision)  Airex: eyes open 30 seconds, eyes closed 3x 10 seconds, head turns R and L x1 min, look up/down x 30 seconds  Sit to stand with 360  deg turns R/L 3x ea  Not today: Standing tandem stance x 5 trials 5-10 sec ea foot placement (PT supervision) Standing alternating foot taps on step: 2x8 (6 inch height) Mini squats with chair behind her 2x8 Standing marches 2x10 LAQ: R 4# 2x10, L 3# 2x10 Seated marches 2x10 Toe raises seated: 2x10 Standing toe taps on steps: x15 alternating, PT cues for stand tall Standing scapular retractions 2x10 with PT verbal/tactile cues  Seated shoulder flexion with PT verbal cues for upright posture Hurdles: forward 2 laps in parallel bars Alternating marches 2x10 same weight as above, seated, x15 ea Hurdles: side stepping in parallel bars 2 laps Front step ups: x10 R and x10 L with b/l UE support Amb x 4 min with SPC at end and PT supervision on flat surface to car- PT verbal cues to stand tall Seated marches L  2x10 5 lb on distal femur Seated LAQ 2x15 L 3 lb Seated ankle DF x20 6 min walk test: stopped at 5:40 today due to pt reporting LE L>R fatigue (340 ft) with SPC and PT supervision  Pt education: reviewed upright posture during ambulation today with cues for stand tall   HEP Access Code: [email or web address] URL: [email or web address] Date: 10/06/2023 Prepared by: Vernell Reges  Exercises - Seated Hip Adduction Isometrics with Mercer  - 1 x daily - 7 x weekly - 2 sets - 10 reps - 5 hold - Seated Hip Abduction with Resistance  - 1 x daily - 7 x weekly - 2 sets - 10 reps - 5 hold - Seated March  - 1 x daily - 7 x weekly - 2 sets - 10 reps - Seated Heel Toe Raises  - 2 x daily - 7 x weekly - 2 sets - 15 reps - Sit to Stand  - 2 x daily - 7 x weekly - 2 sets - 5 reps   PATIENT EDUCATION: Education details: PT POC/goals, purpose of PT to address strength/balance/gait impairments and to reduce fall risk/improve safety during amb and ADLs at home Person educated: Patient Education method: Explanation Education comprehension: verbalized understanding  HOME EXERCISE  PROGRAM: [email or web address]  GOALS: Goals reviewed with patient? Yes  SHORT TERM GOALS: Target date: 10/07/23  Pt will be instructed on strategies to improve home enrivonment safety during amb at home to promote safe independent living with reduced risk for falls (lighting, throw rugs, obstacles, furniture placement) Baseline: initiated during evaluation today Goal status: INITIAL   LONG TERM GOALS: Target date: 10/27/22  Administer FOTO at next visit and set appropriate FOTO goal Baseline: will be set at visit #2- 11/03/23 DC this goal Goal status: DC this goal  2.  Improve L LE strength 1/2 MMT grade to promote improved ability to amb without SPC at PLOF for short distances around her home Baseline: using SPC for amb at self selected height, has not been properly fitted yet; PT  adjusted height of SPC to appropriate level today at end of evaluation appointment; 11/03/23: 4-/5 hip flex Goal status: INITIAL  3.  Pt will be able to peform 5x STS in <15 seconds indicating reduced risk for falling Baseline: 26 seconds; 11/03/23: 17 seconds Goal status: INITIAL   4. Pt will be able to amb x >6 min without taking a rest break in 6 MWT to facilitate improved ability to ambulate community distances in/out of stores, to/from her car, and with her dog for taking care of her dog independently.   Baseline: 5:40 min (340 ft)    Goal status: new  5. Improve BERG to >49 indicating reduced risk for falls  Baseline: 35/56 (11/05/23)  Goal status: new ASSESSMENT:  CLINICAL IMPRESSION: Pt able to perform multiple exercises, stacked 2-3 at a time before taking a seating rest break to work on improving standing tolerance.  Addition of head movements vertically recreated her dizziness today; will further assess at next visit.  She should continue to benefit from consistent skilled PT to improve strength, balance, activity tolerance, and walking tolerance to facilitate improved safety during ambulation and improved  ability to perform her daily activities at [facility] before her CVA.  OBJECTIVE IMPAIRMENTS: Abnormal gait, decreased activity tolerance, decreased balance, decreased endurance, decreased mobility, difficulty walking, decreased strength, decreased safety awareness, and impaired perceived functional ability.   ACTIVITY LIMITATIONS: carrying, lifting, standing, squatting, stairs, and locomotion level  PARTICIPATION LIMITATIONS: meal prep, cleaning, laundry, interpersonal relationship, shopping, and community activity  PERSONAL FACTORS: Time since onset of injury/illness/exacerbation and 1-2 comorbidities: h/o lumbar DJD and possible h/o hip fx (unclear from pt report today)  are also affecting patient's functional outcome.   REHAB POTENTIAL: Good  CLINICAL DECISION MAKING: Stable/uncomplicated  EVALUATION COMPLEXITY: Low  PLAN:  PT FREQUENCY: 2x/week  PT DURATION: 6 weeks  PLANNED INTERVENTIONS: 97110-Therapeutic exercises, 97530- Therapeutic activity, 97112- Neuromuscular re-education, 97535- Self Care, and [id number]- Manual therapy  PLAN FOR NEXT SESSION: LE strengthening; balance exercises, continue focusing on sit to stand, step ups, monitor BP; continue working on walking endurance and standing endurance, look at VOR next session   Vernell Reges, PT, DPT, OCS Kavon Valenza E Brean Carberry, PT 11/12/2023, 2:24 PM

## 2023-11-17 ENCOUNTER — Encounter: Payer: Self-pay | Admitting: Family Medicine

## 2023-11-18 ENCOUNTER — Ambulatory Visit: Payer: Medicare HMO | Admitting: Neurology

## 2023-11-18 ENCOUNTER — Other Ambulatory Visit: Payer: Self-pay | Admitting: Family Medicine

## 2023-11-18 ENCOUNTER — Encounter: Payer: Self-pay | Admitting: Neurology

## 2023-11-18 VITALS — BP 138/80 | HR 80 | Ht 65.0 in | Wt 213.0 lb

## 2023-11-18 DIAGNOSIS — I10 Essential (primary) hypertension: Secondary | ICD-10-CM

## 2023-11-18 DIAGNOSIS — I639 Cerebral infarction, unspecified: Secondary | ICD-10-CM | POA: Insufficient documentation

## 2023-11-18 DIAGNOSIS — M48062 Spinal stenosis, lumbar region with neurogenic claudication: Secondary | ICD-10-CM | POA: Diagnosis not present

## 2023-11-18 MED ORDER — CLOPIDOGREL BISULFATE 75 MG PO TABS: 75.0000 mg | ORAL_TABLET | Freq: Every day | ORAL | 3 refills | Status: AC

## 2023-11-18 NOTE — Progress Notes (Signed)
Chief Complaint  Patient presents with   New Patient (Initial Visit)    Pt in 15, here with daughter Neysa Bonito  Pt is referred for multiple TIA's and acute stroke on MRI. Pt states her TIAs happened in oct/2024. Pt states continues with left hand/leg weakness. States is doing PT.    ASSESSMENT AND PLAN  CATTLEYA DOBRATZ is a 78 y.o. female   Acute right corona radiata stroke in October 2024  Vascular risk factor obesity, hypertension hyperlipidemia, sedentary lifestyle  Was on aspirin, will switch to Plavix 75 mg daily  Complete stroke evaluation with echocardiogram, ultrasound of carotid artery  Lumbar stenosis Gait abnormality  Is receiving  physical therapy  DIAGNOSTIC DATA (LABS, IMAGING, TESTING) - I reviewed patient records, labs, notes, testing and imaging myself where available.   MEDICAL HISTORY:  FAHMIDA JURICH, is a 78 year old female, accompanied by her daughter, seen in request by her primary care from [facility] doctor Smitty Cords, for evaluation of stroke,  History is obtained from the patient and review of electronic medical records. I personally reviewed pertinent available imaging films in PACS.   PMHx of  HTN Osteoporosis HLD   She lives alone, still driving, I saw her in 1610 for lumbar radiculopathy, MRI of the lumbar from July 2023 showed multilevel degenerative changes, worse at L2-3, with evidence of severe spinal stenosis, and moderate canal stenosis L3-4, variable degree of foraminal narrowing, she denied persistent lower extremity motor or sensory deficit, does have urinary urgency, but denies significant low back pain, against the idea of surgical intervention, we decided conservative approach, her gait abnormality is also complicated by her diffuse osteopenia, subcapital right hip fracture, degenerative changes of both hips, she was a hip replacement candidate, but decided to hold off  July 27, 2023, after shower at night, trying  to get into bed, stepped up on the stool at the bedside, she lost the balance, fell to the ground, was able to get up, but noticed left hand clumsiness, later mild left hip difficulty  Left side difficulty persistent next morning, prompted by her daughter, seen by primary care who ordered MRI of the brain on August 07, 2023, acute infarction at the right corona radiata also 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  PHYSICAL EXAMNIATION:  Gen: NAD, conversant, well nourised, well groomed                     Cardiovascular: Regular rate rhythm, no peripheral edema, warm, nontender. Eyes: Conjunctivae clear without exudates or hemorrhage Neck: Supple, no carotid bruits. Pulmonary: Clear to auscultation bilaterally   NEUROLOGICAL EXAM:  MENTAL STATUS: Speech/cognition: Awake, alert, oriented to history taking and casual conversation CRANIAL NERVES: CN II: Visual fields are full to confrontation. Pupils are round equal and briskly reactive to light. CN III, IV, VI: extraocular movement are normal. No ptosis. CN V: Facial sensation is intact to light touch CN VII: Face is symmetric with normal eye closure  CN VIII: Hearing is normal to causal conversation. CN IX, X: Phonation is normal. CN  XI: Head turning and shoulder shrug are intact  MOTOR: Slight fixation of left arm on rapid rotating movement  REFLEXES: Reflexes are 1 and symmetric at the biceps, triceps, knees, and ankles. Plantar responses are flexor.  SENSORY: Intact to light touch, pinprick and vibratory sensation are intact in fingers and  toes.  COORDINATION: There is no trunk or limb dysmetria noted.  GAIT/STANCE: Push-up to get up from seated position, dragging left leg some  REVIEW OF SYSTEMS:  Full 14 system review of systems performed and notable only for as above All other review of systems were negative.   ALLERGIES: Allergies  Allergen Reactions   Simvastatin Other (See Comments)    Myalgia, muscle aches lower extremity   Erythromycin Nausea And Vomiting    HOME MEDICATIONS: Current Outpatient Medications  Medication Sig Dispense Refill   Acetaminophen (TYLENOL 8 HOUR ARTHRITIS PAIN PO) Take by mouth. 2 in am 1 in pm     albuterol (VENTOLIN HFA) 108 (90 Base) MCG/ACT inhaler Inhale 2 puffs into the lungs every 4 (four) hours as needed for wheezing or shortness of breath (cough). 8.5 g 2   amLODipine (NORVASC) 10 MG tablet Take 1 tablet (10 mg total) by mouth daily. 90 tablet 3   ascorbic acid (VITAMIN C) 500 MG tablet Take by mouth.     aspirin 81 MG tablet Take 81 mg by mouth daily.     beta carotene w/minerals (OCUVITE) tablet Take 1 tablet by mouth daily.     carvedilol (COREG) 12.5 MG tablet Take 1 tablet (12.5 mg total) by mouth 2 (two) times daily with a meal. 180 tablet 1   Cholecalciferol 10 MCG (400 UNIT) CAPS Take 1 tablet by mouth daily with breakfast.     estradiol (ESTRACE) 0.1 MG/GM vaginal cream Estrogen Cream Instruction  Discard applicator  Apply pea sized amount to tip of finger to urethra before bed. Wash hands well after application. Use Monday, Wednesday and Friday     fluticasone (FLONASE) 50 MCG/ACT nasal spray Place 2 sprays into both nostrils daily. 16 g 5   furosemide (LASIX) 20 MG tablet Take 1-2 tablets (20-40 mg total) by mouth daily as needed for fluid or edema. Recommend use up to 3-5 days at a time if needed. 30 tablet 2   gabapentin (NEURONTIN) 300 MG capsule Take 2 capsules (600 mg total) by mouth at bedtime as needed. 60 capsule 1   hydrochlorothiazide (HYDRODIURIL) 25  MG tablet Take 1 tablet (25 mg total) by mouth daily. 90 tablet 3   irbesartan (AVAPRO) 300 MG tablet Take 1 tablet (300 mg total) by mouth daily. 90 tablet 3   Krill Oil 300 MG CAPS Take by mouth daily.     loratadine (CLARITIN) 10 MG tablet Take 10 mg by mouth daily.     Mag Aspart-Potassium Aspart (POTASSIUM & MAGNESIUM ASPARTAT PO) Take 1 tablet by mouth daily.     mirabegron ER (MYRBETRIQ) 50 MG TB24 tablet Take 1 tablet (50 mg total) by mouth daily.     Multiple Vitamin (MULTIVITAMIN) tablet Take 1 tablet by mouth daily.     naproxen (NAPROSYN) 500 MG tablet Take 1 tablet (500 mg total) by mouth 2 (two) times daily with a meal. For 1-2 weeks then as needed 60 tablet 1   pantoprazole (PROTONIX) 40 MG tablet Take 1 tablet (40 mg total) by mouth daily before breakfast. 90 tablet 3   REPATHA SURECLICK 140 MG/ML SOAJ Inject 140 mg into the skin every 14 (  fourteen) days. 6 mL 3   No current facility-administered medications for this visit.    PAST MEDICAL HISTORY: Past Medical History:  Diagnosis Date   Arthropathy, unspecified, site unspecified    Esophageal reflux    Osteoarthritis    Osteoporosis, unspecified    Other abnormal glucose    Other and unspecified hyperlipidemia    Symptomatic menopausal or female climacteric states    Syncope and collapse     PAST SURGICAL HISTORY: Past Surgical History:  Procedure Laterality Date   ABDOMINAL HYSTERECTOMY     APPENDECTOMY     BREAST BIOPSY Left 1970's   neg   COLONOSCOPY     CYST REMOVAL TRUNK  11/24/2020   from back   FINGER FRACTURE SURGERY      FAMILY HISTORY: Family History  Problem Relation Age of Onset   Diabetes Mother    Heart disease Mother    Heart attack Mother    Ulcers Father    Gallstones Father    Heart attack Brother    Migraines Neg Hx    Dementia Neg Hx     SOCIAL HISTORY: Social History   Socioeconomic History   Marital status: Widowed    Spouse name: Not on file   Number of children: Not  on file   Years of education: Not on file   Highest education level: Some college, no degree  Occupational History   Occupation: retired  Tobacco Use   Smoking status: Former    Current packs/day: 0.00    Average packs/day: 1 pack/day for 26.0 years (26.0 ttl pk-yrs)    Types: Cigarettes    Start date: 06/07/1966    Quit date: 06/07/1992    Years since quitting: 31.4    Passive exposure: Past   Smokeless tobacco: Former  Building services engineer status: Never Used  Substance and Sexual Activity   Alcohol use: Not Currently    Alcohol/week: 0.0 standard drinks of alcohol    Comment: occassional   Drug use: No   Sexual activity: Not on file  Other Topics Concern   Not on file  Social History Narrative   Not on file   Social Drivers of Health   Financial Resource Strain: Low Risk  (09/17/2023)   Overall Financial Resource Strain (CARDIA)    Difficulty of Paying Living Expenses: Not hard at all  Food Insecurity: No Food Insecurity (09/17/2023)   Hunger Vital Sign    Worried About Running Out of Food in the Last Year: Never true    Ran Out of Food in the Last Year: Never true  Transportation Needs: No Transportation Needs (09/17/2023)   PRAPARE - Administrator, Civil Service (Medical): No    Lack of Transportation (Non-Medical): No  Physical Activity: Inactive (09/17/2023)   Exercise Vital Sign    Days of Exercise per Week: 0 days    Minutes of Exercise per Session: 20 min  Stress: No Stress Concern Present (07/30/2023)   [employer] of Occupational Health - Occupational Stress Questionnaire    Feeling of Stress : Not at all  Social Connections: Moderately Isolated (07/30/2023)   Social Connection and Isolation Panel [NHANES]    Frequency of Communication with Friends and Family: More than three times a week    Frequency of Social Gatherings with Friends and Family: More than three times a week    Attends Religious Services: More than 4 times per year     Active Member of Clubs or  Organizations: No    Attends Banker Meetings: Never    Marital Status: Widowed  Intimate Partner Violence: Not At Risk (09/17/2023)   Humiliation, Afraid, Rape, and Kick questionnaire    Fear of Current or Ex-Partner: No    Emotionally Abused: No    Physically Abused: No    Sexually Abused: No      Levert Feinstein, M.D. Ph.D.  [facility] [address] [place], [place] [address] Ph: [phone] Fax: [phone]  CC:  Lorre Munroe, NP [address] [place],  [place] [address]  Smitty Cords, DO

## 2023-11-19 ENCOUNTER — Ambulatory Visit: Payer: Medicare HMO

## 2023-11-19 DIAGNOSIS — M6281 Muscle weakness (generalized): Secondary | ICD-10-CM | POA: Diagnosis not present

## 2023-11-19 DIAGNOSIS — R29898 Other symptoms and signs involving the musculoskeletal system: Secondary | ICD-10-CM

## 2023-11-19 NOTE — Therapy (Signed)
OUTPATIENT PHYSICAL THERAPY NEURO TREATMENT-    Patient Name: Yvette [name] MRN: [id number] DOB:January 07, 1946, 78 y.o., female Today's Date: 11/19/2023   PCP: Dr. Althea Charon, DO REFERRING PROVIDER: Nicki Reaper, NP  END OF SESSION:09/10/23  PT End of Session - 11/19/23 1416     Visit Number 12    Number of Visits 25    Date for PT Re-Evaluation 12/15/23    Authorization Type updated cert/PN done on 11/03/23- 2x/week x 6 weeks (12/15/23); Medicare PN at visit #20    PT Start Time 1415    PT Stop Time 1500    PT Time Calculation (min) 45 min    Equipment Utilized During Treatment Gait belt    Activity Tolerance Patient tolerated treatment well    Behavior During Therapy WFL for tasks assessed/performed             Past Medical History:  Diagnosis Date   Arthropathy, unspecified, site unspecified    Esophageal reflux    Osteoarthritis    Osteoporosis, unspecified    Other abnormal glucose    Other and unspecified hyperlipidemia    Symptomatic menopausal or female climacteric states    Syncope and collapse    Past Surgical History:  Procedure Laterality Date   ABDOMINAL HYSTERECTOMY     APPENDECTOMY     BREAST BIOPSY Left 1970's   neg   COLONOSCOPY     CYST REMOVAL TRUNK  11/24/2020   from back   FINGER FRACTURE SURGERY     Patient Active Problem List   Diagnosis Date Noted   Cerebrovascular accident (CVA) (HCC) 11/18/2023   Spinal stenosis of lumbar region with neurogenic claudication 11/18/2023   History of cerebrovascular accident (CVA) with residual deficit 08/28/2023   Left hip pain 06/19/2022   Lumbar radiculopathy 04/02/2022   Gait abnormality 04/02/2022   Urinary urgency 04/02/2022   Familial hyperlipidemia 12/07/2020   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   Abscess 04/07/2020   Venous insufficiency of both lower extremities 12/01/2019   Suspected sleep apnea 07/13/2018   Former smoker 07/13/2018   Multiple renal cysts 11/12/2017   Vitamin D deficiency  10/15/2017   Spondylosis of lumbar region without myelopathy or radiculopathy 06/10/2017   Chronic right-sided low back pain with sciatica 06/10/2017   Primary osteoarthritis involving multiple joints 06/10/2017   BPPV (benign paroxysmal positional vertigo), right 06/10/2017   Nausea without vomiting 06/10/2017   Environmental and seasonal allergies 02/12/2017   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/2017   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   Dupuytren's contracture of left hand 10/14/2016   Anxiety disorder 09/03/2016   GERD (gastroesophageal reflux disease) 09/03/2016   Prophylactic use of raloxifene ([place]) 09/03/2016   Hyperlipidemia associated with type 2 diabetes mellitus (HCC) 09/03/2016   Type 2 diabetes mellitus with other specified complication (HCC) 09/12/2015   Dyspnea 07/23/2013   Essential hypertension     ONSET DATE: October 2024  REFERRING DIAG: R29.898 (ICD-10-CM) - Bilateral leg weakness   I69.30 (ICD-10-CM) - History of cerebrovascular accident (CVA) with residual deficit   THERAPY DIAG:  Weakness of both lower extremities  Muscle weakness (generalized)  Rationale for Evaluation and Treatment: Rehabilitation  SUBJECTIVE: From initial evaluation on 09/08/23  SUBJECTIVE STATEMENT: Pt reports at end of October, 2024 (does not recall exact day) she was getting into bed and she felt like she stepped up on the step stool to get into bed and when she turned around she felt off balance and fell onto the floor. She reports she had difficulty moving her L LE and it felt a little weaker.  She reports she did not injure anything.  She crawled to her cedar chest and used that to push herself up from and used her R leg to stand up.  She reports she did not call any family or neighbors and she slept in the  recliner chair that day.    She called her PCP the next week (few days later) to check in.  She was evaluated by PCP and referred to PT. Pt states she was evaluated and referred to PT because the "episode" was a stroke.  She has not seen any other providers for this.  Current function: intermittently she feels like she is "dragging her L leg" since this incident.  Her L foot has caught a few times but she has not fallen since then.    She is now amb with SPC, she reports she did not use the [facility] prior to this "episode".  She feels like it is helpful when she gets tired.    Pt accompanied by: self; she states she has family that live close by (daughter)  PERTINENT HISTORY: pt reports she has h/o LBP (DJD)  and difficulty with prolonged standing/walking  intermittently; osteopenia; and h/o stable L hip fx; unclear which hip though- upon chart review the xray results from 03/2022 states stable R hip fx on impression; pt states she has seen orthopedist for this and opted to not pursue surgery.  PAIN:  Are you having pain? Not today  PRECAUTIONS: Fall  RED FLAGS: None   WEIGHT BEARING RESTRICTIONS: No  FALLS: Has patient fallen in last 6 months? Yes, 1x (see above)  LIVING ENVIRONMENT: Lives with: lives with their family and lives alone, pt is a widow; daughter "Arline Asp" lives 4 miles; has a Nurse, mental health at home Lives in: [employer]: 2 steps to enter home on deck, with railings on back of home; has 5 steps in front of home with railing  Has following equipment at home: Single point cane  PLOF: Independent  PATIENT GOALS: to be able to walk longer and with improved balance inside and outside of her home  OBJECTIVE:  Note: Objective measures were completed at Evaluation unless otherwise noted.  DIAGNOSTIC FINDINGS: per chart review: brain MRI on 08/07/23: FINDINGS: Brain: There is a 1.3 cm acute infarct in the right corona radiata. Chronic infarcts are noted in the left corona  radiata, right basal ganglia, and both thalami. Small T2 hyperintensities elsewhere in the cerebral white matter bilaterally are nonspecific but compatible with mild chronic small vessel ischemic disease. There is a single chronic microhemorrhage in the right cerebellar hemisphere. No mass, midline shift, or extra-axial fluid collection is identified. There is mild generalized cerebral atrophy. IMPRESSION: 1. Acute infarct in the right corona radiata. 2. 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    COGNITION: Overall cognitive status: Within functional limits for tasks assessed   SENSATION: WFL  COORDINATION: Pt demonstrated normal coordination with finger to nose with PT and LE heel/shin motion  POSTURE: rounded shoulders  LOWER EXTREMITY ROM:     Active  Right Eval Left Eval  Hip flexion [facility]  for sit to stand and amb [facility] for sit to stand and amb  Hip extension    Hip abduction    Hip adduction    Hip internal rotation    Hip external rotation    Knee flexion    Knee extension    Ankle dorsiflexion    Ankle plantarflexion    Ankle inversion    Ankle eversion     (Blank rows = not tested)  LOWER EXTREMITY MMT:    MMT Right Eval Left Eval  Hip flexion 5 4  Hip extension    Hip abduction 4 4  Hip adduction    Hip internal rotation 4+ 4  Hip external rotation 4+ 4  Knee flexion 5 4  Knee extension 5 4  Ankle dorsiflexion 5 4  Ankle plantarflexion    Ankle inversion    Ankle eversion    (Blank rows = not tested)  BED MOBILITY:  Will be assessed at future visit  TRANSFERS: Assistive device utilized: SPC during amb Sit to stand: Complete Independence Stand to sit: Complete Independence Chair to chair: able to without SPC, uses b/l UE support Floor: deferred today  STAIRS: Will assess at future visit  GAIT: Gait pattern: wide BOS and poor foot clearance- Left Distance walked: pt amb in/out of clinic  independently Assistive device utilized: Single point cane Level of assistance: Complete Independence Comments: pt amb with decreased gait speed; states she feels fatigued more easily during amb than prior to October  FUNCTIONAL TESTS:  5 times sit to stand: 26 seconds (indicating increased fall risk); pt uses UE support Additional balance testing will be administered at future visit  PATIENT SURVEYS:  FOTO will be administered at future visit; clinic lost electricity/internet during pt's appointment today  TODAY'S TREATMENT:                                                                                                                              DATE: 11/19/23  Subjective: pt reports she was able to see her neurologist for a consult yesterday; this was a consult after having the CVA in October 2024.  She reports the appointment went well.  She has been experiencing some dizziness with lying down and turning in bed and sometimes when she is walking she feels like everything is tilted to the right.  Pain: 2/10 in L hip today  Objective: Pt goal: to be able to walk longer distances  Therapeutic Exercises: Walking in hallway with SPC and PT supervision (gait belt) x 8 min including 2 min rest break, frequent verbal cues to "lift L foot up"  Sit to stand 2x5 from chair with airex, focused on reducing UE use, verbal cues to "stand tall" and "lower slowly"  Standing scapular retraction x 20 with PT cues to stand tall  Standing heel raises: 15x in parallel bars  Standing marches: 2x10 ea in parallel bars  Side stepping along parallel bar 4 laps  Step over obstacles (using single rail) - yoga blocks, shoe boxes 4 laps in parallel bars  Alternating foot taps on 6 inch step in parallel bars 30 second intervals with 30 second rest in standing, x 4 rounds  Stair navigation: ascend/descend 4 stairs, 2 laps  Neuro re-ed: Screen for BPPV  Not today: Nustep: level 2-3 x 10 minutes, focus  on LE strength to address her impairment of decreased walking endurance after previous sessions 6 min walk test attempt- not today Standing SLS: x 3 ea side, trials, only able to stand R x 4-5 sec, L x 2-3 sec (PT supervision)  Airex: eyes open 30 seconds, eyes closed 3x 10 seconds, head turns R and L x1 min, look up/down x 30 seconds  Sit to stand with 360 deg turns R/L 3x ea Standing tandem stance x 5 trials 5-10 sec ea foot placement (PT supervision) Standing alternating foot taps on step: 2x8 (6 inch height) Mini squats with chair behind her 2x8 Standing marches 2x10 LAQ: R 4# 2x10, L 3# 2x10 Seated marches 2x10 Toe raises seated: 2x10 Standing toe taps on steps: x15 alternating, PT cues for "stand tall" Standing scapular retractions 2x10 with PT verbal/tactile cues  Seated shoulder flexion with PT verbal cues for upright posture Hurdles: forward 2 laps in parallel bars Alternating marches 2x10 same weight as above, seated, x15 ea Hurdles: side stepping in parallel bars 2 laps Front step ups: x10 R and x10 L with b/l UE support Amb x 4 min with SPC at end and PT supervision on flat surface to car- PT verbal cues to "stand tall" Seated marches L  2x10 5 lb on distal femur Seated LAQ 2x15 L 3 lb Seated ankle DF x20 6 min walk test: stopped at 5:40 today due to pt reporting LE L>R fatigue (340 ft) with SPC and PT supervision  Pt education: reviewed upright posture during ambulation today with cues for "stand tall"   HEP Access Code: [email or web address] URL: [email or web address] Date: 10/06/2023 Prepared by: Max Fickle  Exercises - Seated Hip Adduction Isometrics with Newman Pies  - 1 x daily - 7 x weekly - 2 sets - 10 reps - 5 hold - Seated Hip Abduction with Resistance  - 1 x daily - 7 x weekly - 2 sets - 10 reps - 5 hold - Seated March  - 1 x daily - 7 x weekly - 2 sets - 10 reps - Seated Heel Toe Raises  - 2 x daily - 7 x weekly - 2 sets - 15 reps - Sit to Stand  - 2 x  daily - 7 x weekly - 2 sets - 5 reps   PATIENT EDUCATION: Education details: PT POC/goals, purpose of PT to address strength/balance/gait impairments and to reduce fall risk/improve safety during amb and ADLs at home Person educated: Patient Education method: Explanation Education comprehension: verbalized understanding  HOME EXERCISE PROGRAM: [email or web address]  GOALS: Goals reviewed with patient? Yes  SHORT TERM GOALS: Target date: 10/07/23  Pt will be instructed on strategies to improve home enrivonment safety during amb at home to promote safe independent living with reduced risk for falls (lighting, throw rugs, obstacles, furniture placement) Baseline: initiated during evaluation today Goal status: INITIAL   LONG TERM GOALS: Target date: 10/27/22  Administer FOTO at next visit and set appropriate FOTO goal Baseline: will be set at visit #2- 11/03/23 DC this goal Goal status: DC this goal  2.  Improve L LE strength 1/2 MMT grade to promote improved  ability to amb without SPC at [facility] for short distances around her home Baseline: using SPC for amb at self selected height, has not been properly fitted yet; PT adjusted height of SPC to appropriate level today at end of evaluation appointment; 11/03/23: 4-/5 hip flex Goal status: INITIAL  3.  Pt will be able to peform 5x STS in <15 seconds indicating reduced risk for falling Baseline: 26 seconds; 11/03/23: 17 seconds Goal status: INITIAL   4. Pt will be able to amb x >6 min without taking a rest break in 6 MWT to facilitate improved ability to ambulate community distances in/out of stores, to/from her car, and with her dog for taking care of her dog independently.   Baseline: 5:40 min (340 ft)    Goal status: new  5. Improve BERG to >49 indicating reduced risk for falls  Baseline: 35/56 (11/05/23)  Goal status: new ASSESSMENT:  CLINICAL IMPRESSION: Continued working on improving standing and walking endurance in session today.  She does  respond well to verbal/visual cues when L foot begins to drag during amb and is able to adjust gait for normal L clearance during swing phase for short amounts of time.  Pt able to perform multiple exercises, stacked 2-3 at a time before taking a seating rest break to work on improving standing tolerance.  Standing intervals were between 7-8 minutes each during today's appointment.  She did note onset of her typical dizziness when turing directions to ambulate after the stairs today.  Able to consult with colleague for BPPV screen.  It was determined that she is an appropriate candidate for further tx to address findings today.  She should continue to benefit from consistent skilled PT to improve strength, balance, activity tolerance, and walking tolerance to facilitate improved safety during ambulation and improved ability to perform her daily activities at [facility] before her CVA.  OBJECTIVE IMPAIRMENTS: Abnormal gait, decreased activity tolerance, decreased balance, decreased endurance, decreased mobility, difficulty walking, decreased strength, decreased safety awareness, and impaired perceived functional ability.   ACTIVITY LIMITATIONS: carrying, lifting, standing, squatting, stairs, and locomotion level  PARTICIPATION LIMITATIONS: meal prep, cleaning, laundry, interpersonal relationship, shopping, and community activity  PERSONAL FACTORS: Time since onset of injury/illness/exacerbation and 1-2 comorbidities: h/o lumbar DJD and possible h/o hip fx (unclear from pt report today)  are also affecting patient's functional outcome.   REHAB POTENTIAL: Good  CLINICAL DECISION MAKING: Stable/uncomplicated  EVALUATION COMPLEXITY: Low  PLAN:  PT FREQUENCY: 2x/week  PT DURATION: 6 weeks  PLANNED INTERVENTIONS: 97110-Therapeutic exercises, 97530- Therapeutic activity, 97112- Neuromuscular re-education, 97535- Self Care, and 72536- Manual therapy  PLAN FOR NEXT SESSION: LE strengthening; balance  exercises, continue focusing on sit to stand, step ups, monitor BP; continue working on walking endurance and standing endurance, tx for BPPV  Max Fickle, PT, DPT, OCS Ardine Bjork, PT 11/19/2023, 2:16 PM

## 2023-11-19 NOTE — Telephone Encounter (Signed)
Rx 01/01/23 #90 3RF- too soon Requested Prescriptions  Pending Prescriptions Disp Refills   hydrochlorothiazide (HYDRODIURIL) 25 MG tablet [Pharmacy Med Name: HYDROCHLOROTHIAZIDE 25 MG TAB] 90 tablet 0    Sig: Take 1 tablet (25 mg total) by mouth daily.     Cardiovascular: Diuretics - Thiazide Failed - 11/19/2023  1:06 PM      Failed - Cr in normal range and within 180 days    Creat  Date Value Ref Range Status  07/31/2023 0.59 (L) 0.60 - 1.00 mg/dL Final   Creatinine, Urine  Date Value Ref Range Status  07/08/2023 166 20 - 275 mg/dL Final         Passed - K in normal range and within 180 days    Potassium  Date Value Ref Range Status  07/31/2023 4.1 3.5 - 5.3 mmol/L Final  11/20/2011 4.1 3.5 - 5.1 mmol/L Final         Passed - Na in normal range and within 180 days    Sodium  Date Value Ref Range Status  07/31/2023 140 135 - 146 mmol/L Final  11/20/2011 141 136 - 145 mmol/L Final         Passed - Last BP in normal range    BP Readings from Last 1 Encounters:  11/18/23 138/80         Passed - Valid encounter within last 6 months    Recent Outpatient Visits           2 months ago Essential hypertension   [facility] Smitty Cords, DO   3 months ago Weakness of left upper extremity   [facility], Salvadore Oxford, NP   4 months ago Type 2 diabetes mellitus with other specified complication, without long-term current use of insulin [facility])   [facility] [facility] Smitty Cords, DO   6 months ago Urinary frequency   [place] [place], Salvadore Oxford, NP   8 months ago Urinary frequency   [facility] [place], Salvadore Oxford, NP       Future Appointments             In 2 weeks [place], Elana Alm [place] [facility] [place]   In 1 month Althea Charon, Netta Neat, DO [facility]

## 2023-11-26 ENCOUNTER — Ambulatory Visit: Payer: Medicare HMO

## 2023-11-27 ENCOUNTER — Telehealth: Payer: Self-pay | Admitting: Family Medicine

## 2023-11-27 NOTE — Telephone Encounter (Signed)
LVM 11/27/2023 to r/s AWV due to it was sched on a holiday [facility].  Please reschedule AWV  Yvette [name]; Care Guide Ambulatory Clinical Support [place] [facility] Direct Dial: [phone]

## 2023-11-28 ENCOUNTER — Ambulatory Visit (HOSPITAL_BASED_OUTPATIENT_CLINIC_OR_DEPARTMENT_OTHER)
Admission: RE | Admit: 2023-11-28 | Discharge: 2023-11-28 | Disposition: A | Discharge status: Home / Self Care | Payer: Medicare HMO | Source: Ambulatory Visit | Attending: Neurology | Admitting: Neurology

## 2023-11-28 ENCOUNTER — Ambulatory Visit (HOSPITAL_COMMUNITY)
Admission: RE | Admit: 2023-11-28 | Discharge: 2023-11-28 | Disposition: A | Discharge status: Home / Self Care | Payer: Medicare HMO | Source: Ambulatory Visit | Attending: Neurology | Admitting: Neurology

## 2023-11-28 DIAGNOSIS — Z8673 Personal history of transient ischemic attack (TIA), and cerebral infarction without residual deficits: Secondary | ICD-10-CM | POA: Insufficient documentation

## 2023-11-28 DIAGNOSIS — I639 Cerebral infarction, unspecified: Secondary | ICD-10-CM | POA: Insufficient documentation

## 2023-11-28 DIAGNOSIS — R55 Syncope and collapse: Secondary | ICD-10-CM | POA: Insufficient documentation

## 2023-11-28 DIAGNOSIS — I10 Essential (primary) hypertension: Secondary | ICD-10-CM | POA: Diagnosis not present

## 2023-11-28 DIAGNOSIS — Z87891 Personal history of nicotine dependence: Secondary | ICD-10-CM | POA: Diagnosis not present

## 2023-11-28 DIAGNOSIS — E785 Hyperlipidemia, unspecified: Secondary | ICD-10-CM | POA: Insufficient documentation

## 2023-11-28 LAB — ECHOCARDIOGRAM COMPLETE
AR max vel: 2.45 cm2
AV Area VTI: 2.4 cm2
AV Area mean vel: 2.07 cm2
AV Mean grad: 4 mm[Hg]
AV Peak grad: 7 mm[Hg]
Ao pk vel: 1.32 m/s
Area-P 1/2: 2.75 cm2
MV VTI: 3.23 cm2
S' Lateral: 3 cm

## 2023-11-28 NOTE — Progress Notes (Signed)
*  PRELIMINARY RESULTS* Echocardiogram 2D Echocardiogram has been performed.  Yvette [name] 11/28/2023, 4:06 PM

## 2023-12-01 ENCOUNTER — Encounter: Payer: Self-pay | Admitting: Family Medicine

## 2023-12-03 ENCOUNTER — Ambulatory Visit: Payer: Medicare HMO

## 2023-12-03 ENCOUNTER — Telehealth (INDEPENDENT_AMBULATORY_CARE_PROVIDER_SITE_OTHER): Payer: Medicare HMO | Admitting: Family Medicine

## 2023-12-03 ENCOUNTER — Encounter: Payer: Self-pay | Admitting: Family Medicine

## 2023-12-03 DIAGNOSIS — M47816 Spondylosis without myelopathy or radiculopathy, lumbar region: Secondary | ICD-10-CM

## 2023-12-03 DIAGNOSIS — M150 Primary generalized (osteo)arthritis: Secondary | ICD-10-CM | POA: Diagnosis not present

## 2023-12-03 DIAGNOSIS — R29898 Other symptoms and signs involving the musculoskeletal system: Secondary | ICD-10-CM | POA: Diagnosis not present

## 2023-12-03 DIAGNOSIS — M6281 Muscle weakness (generalized): Secondary | ICD-10-CM

## 2023-12-03 MED ORDER — TRAMADOL HCL 50 MG PO TABS: 50.0000 mg | ORAL_TABLET | Freq: Three times a day (TID) | ORAL | 0 refills | Status: AC | PRN

## 2023-12-03 NOTE — Patient Instructions (Addendum)
 Dose increase Gabapentin from 300mg  x 2 = 600mg  now up to 300mg  x 3 = 900mg  to see if this helps control pain and sleep.  I will order Tramadol stronger pain pill as a back up plan, you can try this in evening for pain and sleep, you can take it with Gabapentin, likely take 30 min apart. Or you can take it if you wake up overnight.  Please schedule a Follow-up Appointment to: Return if symptoms worsen or fail to improve.  If you have any other questions or concerns, please feel free to call the office or send a message through MyChart. You may also schedule an earlier appointment if necessary.  Additionally, you may be receiving a survey about your experience at our office within a few days to 1 week by e-mail or mail. We value your feedback.  Saralyn Pilar, DO [facility], [place]

## 2023-12-03 NOTE — Progress Notes (Signed)
 Subjective:    Patient ID: Yvette [name], female    DOB: [birth year], 78 y.o.   MRN: [id number]  Yvette [name] is a 78 y.o. female presenting on 12/03/2023 for Back Pain   Virtual / Telehealth Encounter - Video Visit via MyChart The purpose of this virtual visit is to provide medical care while limiting exposure to the novel coronavirus (COVID19) for both patient and office staff.  Consent was obtained for remote visit:  Yes.   Answered questions that patient had about telehealth interaction:  Yes.   I discussed the limitations, risks, security and privacy concerns of performing an evaluation and management service by video/telephone. I also discussed with the patient that there may be a patient responsible charge related to this service. The patient expressed understanding and agreed to proceed.  Patient Location: Home Provider Location: [facility] (Office)  Participants in virtual visit: - Patient: Yvette [name] - CMA: Fuller Plan CMA - Provider: Dr Althea Charon   HPI  Discussed the use of AI scribe software for clinical note transcription with the patient, who gave verbal consent to proceed.  History of Present Illness   Yvette [name] is a 78 year old female who presents with joint and arthritis pain management.  History of recent Stroke. She is currently on Plavix and was advised by neurology to stop aspirin and aspirin products. She was previously on naproxen, which has been discontinued. She is taking gabapentin 300mg  x 2 = 600mg  at bedtime, but it is not fully effective as she still experiences pain upon waking. She has not tried Lyrica or muscle relaxants or pain pills.  She experiences chronic joint pain associated with arthritis. Despite taking the maximum dose of Tylenol, 1000 mg three times a day, she continues to experience significant pain. Her pain management is complicated by the need to avoid NSAIDs due to her current medication regimen after  recent Stroke  She reports neck spasms or aches, particularly in the evening, which affects her ability to sleep and rest. She wants a solution that would allow her to sleep without waking up due to pain.  In the past, she has tried baclofen as a muscle relaxant in 2022, but she does not recall trying other muscle relaxants. She notes that some muscle relaxants can make her drowsy or groggy, which she finds uncomfortable.  No current sinus issues, although there was a previous mention of a sinus infection. She attributes her headaches to neck-related issues rather than sinus problems.          09/17/2023   10:52 AM 01/17/2023    8:22 AM 01/01/2023   11:10 AM  Depression screen PHQ 2/9  Decreased Interest 0 0 0  Down, Depressed, Hopeless 0 0 0  PHQ - 2 Score 0 0 0  Altered sleeping  0 0  Tired, decreased energy  0 1  Change in appetite  0 1  Feeling bad or failure about yourself   0 0  Trouble concentrating  0 0  Moving slowly or fidgety/restless  0 0  Suicidal thoughts  0 0  PHQ-9 Score  0 2  Difficult doing work/chores  Not difficult at all Not difficult at all       09/17/2023   10:52 AM 01/01/2023   11:10 AM 07/03/2022   11:13 AM 02/14/2022   10:38 AM  GAD 7 : Generalized Anxiety Score  Nervous, Anxious, on Edge 0 0 0 0  Control/stop worrying 0 0  0 0  Worry too much - different things 0 0 0 0  Trouble relaxing 0 0 0 0  Restless 0 0 0 0  Easily annoyed or irritable 0 0 0 0  Afraid - awful might happen 0 0 0 0  Total GAD 7 Score 0 0 0 0  Anxiety Difficulty  Not difficult at all Not difficult at all Not difficult at all    Social History   Tobacco Use   Smoking status: Former    Current packs/day: 0.00    Average packs/day: 1 pack/day for 26.0 years (26.0 ttl pk-yrs)    Types: Cigarettes    Start date: 06/07/1966    Quit date: 06/07/1992    Years since quitting: 31.5    Passive exposure: Past   Smokeless tobacco: Former  Building services engineer status: Never Used   Substance Use Topics   Alcohol use: Not Currently    Alcohol/week: 0.0 standard drinks of alcohol    Comment: occassional   Drug use: No    Review of Systems Per HPI unless specifically indicated above     Objective:    There were no vitals taken for this visit.  Wt Readings from Last 3 Encounters:  11/18/23 213 lb (96.6 kg)  09/18/23 212 lb (96.2 kg)  09/17/23 212 lb (96.2 kg)     Physical Exam  Note examination was completely remotely via video observation objective data only  Gen - well-appearing, no acute distress or apparent pain, comfortable HEENT - eyes appear clear without discharge or redness Heart/Lungs - cannot examine virtually - observed no evidence of coughing or labored breathing. Abd - cannot examine virtually  Skin - face visible today- no rash Neuro - awake, alert, oriented Psych - not anxious appearing   Results for orders placed or performed during the hospital encounter of 11/28/23  ECHOCARDIOGRAM COMPLETE   Collection Time: 11/28/23  2:50 PM  Result Value Ref Range   S' Lateral 3.00 cm   AV Area VTI 2.40 cm2   AV Mean grad 4.0 mmHg   AV Area mean vel 2.07 cm2   Area-P 1/2 2.75 cm2   AR max vel 2.45 cm2   AV Peak grad 7.0 mmHg   Ao pk vel 1.32 m/s   MV VTI 3.23 cm2   Est EF 60 - 65%       Assessment & Plan:   Problem List Items Addressed This Visit     Primary osteoarthritis involving multiple joints   Relevant Medications   traMADol (ULTRAM) 50 MG tablet   Spondylosis of lumbar region without myelopathy or radiculopathy - Primary   Relevant Medications   traMADol (ULTRAM) 50 MG tablet     Chronic Pain Low back, (Joint and Arthritis) Note recent change w/ CVA history now on Plavix, and OFF Aspirin and NSAID. She cannot take her Naproxen Patient experiencing persistent pain, particularly in the evening, despite taking maximum dose of Tylenol (1000mg  three times a day) and Gabapentin at bedtime.  Unable to take NSAIDs due to  contraindication with Plavix.  -Increase Gabapentin dose to 900mg  at bedtime. -Consider switching from Gabapentin to Pregabalin (Lyrica) if increased Gabapentin dose is ineffective. -Prescribe Tramadol for initial 5 day supply use AS NEEDED sparingly, to see if effective nightly or AS NEEDED if wake up for pain. Review risks benefits side effects sedation. Consider Muscle relaxants as well in future if preferred.  Sinus Issues Patient reports previous sinus infection, but currently no issues. -No  action required at this time.  Follow-up Patient to keep in touch and provide feedback on effectiveness of increased Gabapentin dose and Tramadol trial. Further adjustments to medication regimen may be made based on patient's response.         No orders of the defined types were placed in this encounter.   Meds ordered this encounter  Medications   traMADol (ULTRAM) 50 MG tablet    Sig: Take 1 tablet (50 mg total) by mouth 3 (three) times daily as needed for up to 5 days for moderate pain (pain score 4-6) or severe pain (pain score 7-10).    Dispense:  15 tablet    Refill:  0    Follow up plan: Return if symptoms worsen or fail to improve.   Patient verbalizes understanding with the above medical recommendations including the limitation of remote medical advice.  Specific follow-up and call-back criteria were given for patient to follow-up or seek medical care more urgently if needed.  Total duration of direct patient care provided via video conference: 15 minutes   Saralyn Pilar, DO [facility] 12/03/2023, 4:51 PM

## 2023-12-03 NOTE — Therapy (Signed)
 OUTPATIENT PHYSICAL THERAPY NEURO TREATMENT-    Patient Name: Yvette [name] MRN: [id number] DOB:[birth year], 78 y.o., female Today's Date: 12/03/2023   PCP: Dr. Althea Charon, DO REFERRING PROVIDER: Nicki Reaper, NP  END OF SESSION:09/10/23  PT End of Session - 12/03/23 1415     Visit Number 13    Number of Visits 25    Date for PT Re-Evaluation 12/15/23    Authorization Type updated cert/PN done on 11/03/23- 2x/week x 6 weeks (12/15/23); Medicare PN at visit #20    PT Start Time 1415    PT Stop Time 1500    PT Time Calculation (min) 45 min    Equipment Utilized During Treatment Gait belt    Activity Tolerance Patient tolerated treatment well    Behavior During Therapy WFL for tasks assessed/performed             Past Medical History:  Diagnosis Date   Arthropathy, unspecified, site unspecified    Esophageal reflux    Osteoarthritis    Osteoporosis, unspecified    Other abnormal glucose    Other and unspecified hyperlipidemia    Symptomatic menopausal or female climacteric states    Syncope and collapse    Past Surgical History:  Procedure Laterality Date   ABDOMINAL HYSTERECTOMY     APPENDECTOMY     BREAST BIOPSY Left 1970's   neg   COLONOSCOPY     CYST REMOVAL TRUNK  11/24/2020   from back   FINGER FRACTURE SURGERY     Patient Active Problem List   Diagnosis Date Noted   Cerebrovascular accident (CVA) (HCC) 11/18/2023   Spinal stenosis of lumbar region with neurogenic claudication 11/18/2023   History of cerebrovascular accident (CVA) with residual deficit 08/28/2023   Left hip pain 06/19/2022   Lumbar radiculopathy 04/02/2022   Gait abnormality 04/02/2022   Urinary urgency 04/02/2022   Familial hyperlipidemia 12/07/2020   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   Abscess 04/07/2020   Venous insufficiency of both lower extremities 12/01/2019   Suspected sleep apnea 07/13/2018   Former smoker 07/13/2018   Multiple renal cysts 11/12/2017   Vitamin D deficiency  10/15/2017   Spondylosis of lumbar region without myelopathy or radiculopathy 06/10/2017   Chronic right-sided low back pain with sciatica 06/10/2017   Primary osteoarthritis involving multiple joints 06/10/2017   BPPV (benign paroxysmal positional vertigo), right 06/10/2017   Nausea without vomiting 06/10/2017   Environmental and seasonal allergies 02/12/2017   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/2017   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   Dupuytren's contracture of left hand 10/14/2016   Anxiety disorder 09/03/2016   GERD (gastroesophageal reflux disease) 09/03/2016   Prophylactic use of raloxifene ([place]) 09/03/2016   Hyperlipidemia associated with type 2 diabetes mellitus (HCC) 09/03/2016   Type 2 diabetes mellitus with other specified complication (HCC) 09/12/2015   Dyspnea 07/23/2013   Essential hypertension     ONSET DATE: October 2024  REFERRING DIAG: R29.898 (ICD-10-CM) - Bilateral leg weakness   I69.30 (ICD-10-CM) - History of cerebrovascular accident (CVA) with residual deficit   THERAPY DIAG:  Weakness of both lower extremities  Muscle weakness (generalized)  Rationale for Evaluation and Treatment: Rehabilitation  SUBJECTIVE: From initial evaluation on 09/08/23  SUBJECTIVE STATEMENT: Pt reports at end of October, 2024 (does not recall exact day) she was getting into bed and she felt like she stepped up on the step stool to get into bed and when she turned around she felt off balance and fell onto the floor. She reports she had difficulty moving her L LE and it felt a little weaker.  She reports she did not injure anything.  She crawled to her cedar chest and used that to push herself up from and used her R leg to stand up.  She reports she did not call any family or neighbors and she slept in the  recliner chair that day.    She called her PCP the next week (few days later) to check in.  She was evaluated by PCP and referred to PT. Pt states she was evaluated and referred to PT because the "episode" was a stroke.  She has not seen any other providers for this.  Current function: intermittently she feels like she is "dragging her L leg" since this incident.  Her L foot has caught a few times but she has not fallen since then.    She is now amb with SPC, she reports she did not use the [facility] prior to this "episode".  She feels like it is helpful when she gets tired.    Pt accompanied by: self; she states she has family that live close by (daughter)  PERTINENT HISTORY: pt reports she has h/o LBP (DJD)  and difficulty with prolonged standing/walking  intermittently; osteopenia; and h/o stable L hip fx; unclear which hip though- upon chart review the xray results from 03/2022 states stable R hip fx on impression; pt states she has seen orthopedist for this and opted to not pursue surgery.  PAIN:  Are you having pain? Not today  PRECAUTIONS: Fall  RED FLAGS: None   WEIGHT BEARING RESTRICTIONS: No  FALLS: Has patient fallen in last 6 months? Yes, 1x (see above)  LIVING ENVIRONMENT: Lives with: lives with their family and lives alone, pt is a widow; daughter "Arline Asp" lives 4 miles; has a Nurse, mental health at home Lives in: [employer]: 2 steps to enter home on deck, with railings on back of home; has 5 steps in front of home with railing  Has following equipment at home: Single point cane  PLOF: Independent  PATIENT GOALS: to be able to walk longer and with improved balance inside and outside of her home  OBJECTIVE:  Note: Objective measures were completed at Evaluation unless otherwise noted.  DIAGNOSTIC FINDINGS: per chart review: brain MRI on 08/07/23: FINDINGS: Brain: There is a 1.3 cm acute infarct in the right corona radiata. Chronic infarcts are noted in the left corona  radiata, right basal ganglia, and both thalami. Small T2 hyperintensities elsewhere in the cerebral white matter bilaterally are nonspecific but compatible with mild chronic small vessel ischemic disease. There is a single chronic microhemorrhage in the right cerebellar hemisphere. No mass, midline shift, or extra-axial fluid collection is identified. There is mild generalized cerebral atrophy. IMPRESSION: 1. Acute infarct in the right corona radiata. 2. 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    COGNITION: Overall cognitive status: Within functional limits for tasks assessed   SENSATION: WFL  COORDINATION: Pt demonstrated normal coordination with finger to nose with PT and LE heel/shin motion  POSTURE: rounded shoulders  LOWER EXTREMITY ROM:     Active  Right Eval Left Eval  Hip flexion [facility]  for sit to stand and amb [facility] for sit to stand and amb  Hip extension    Hip abduction    Hip adduction    Hip internal rotation    Hip external rotation    Knee flexion    Knee extension    Ankle dorsiflexion    Ankle plantarflexion    Ankle inversion    Ankle eversion     (Blank rows = not tested)  LOWER EXTREMITY MMT:    MMT Right Eval Left Eval  Hip flexion 5 4  Hip extension    Hip abduction 4 4  Hip adduction    Hip internal rotation 4+ 4  Hip external rotation 4+ 4  Knee flexion 5 4  Knee extension 5 4  Ankle dorsiflexion 5 4  Ankle plantarflexion    Ankle inversion    Ankle eversion    (Blank rows = not tested)  BED MOBILITY:  Will be assessed at future visit  TRANSFERS: Assistive device utilized: SPC during amb Sit to stand: Complete Independence Stand to sit: Complete Independence Chair to chair: able to without SPC, uses b/l UE support Floor: deferred today  STAIRS: Will assess at future visit  GAIT: Gait pattern: wide BOS and poor foot clearance- Left Distance walked: pt amb in/out of clinic  independently Assistive device utilized: Single point cane Level of assistance: Complete Independence Comments: pt amb with decreased gait speed; states she feels fatigued more easily during amb than prior to October  FUNCTIONAL TESTS:  5 times sit to stand: 26 seconds (indicating increased fall risk); pt uses UE support Additional balance testing will be administered at future visit  PATIENT SURVEYS:  FOTO will be administered at future visit; clinic lost electricity/internet during pt's appointment today  TODAY'S TREATMENT:                                                                                                                              DATE: 12/03/23  Subjective: pt reports she is sore in her thigh muscles today; she also reports she continues to experience dizziness/vertigo symptoms.  She has a friend that can come with her for BPPV tx session.    Pain: 2/10 in L hip today  Objective: Pt goal: to be able to walk longer distances  Therapeutic Exercises: Walking in hallway with SPC and PT supervision (gait belt) x 8 min including 2 min rest break, frequent verbal cues to "lift L foot up"- not today  Sit to stand 2x5 from chair with airex, focused on reducing UE use, verbal cues to "stand tall" and "lower slowly"  Standing scapular retraction x 20 with PT cues to stand tall  Standing heel raises: 15x 2 sets in parallel bars  Standing marches: 2x10, 2 sets ea in parallel bars  Side stepping along parallel bar 4 laps, 2 sets  Step over obstacles (using single rail) - yoga blocks, shoe boxes 4 laps in parallel bars  Alternating foot taps on 6 inch step in parallel bars 30 second intervals with 30 second rest in standing, x 4 rounds- not today  Front step ups: 6 inch x10 LRRL, x10 RLLR with b/l railing  Stair navigation: ascend/descend 4 stairs, 2 laps- not today  Seated toe raises: 2x15  Seated hip add: ball 2x12  Nustep: level 3 x 8 minutes, focus on LE strength  for walking endurance instead of walking as pt is experiencing vertigo sx and nustep is safer today; goal SPM today >50 SPM for duration.    Not today: Standing SLS: x 3 ea side, trials, only able to stand R x 4-5 sec, L x 2-3 sec (PT supervision)  Airex: eyes open 30 seconds, eyes closed 3x 10 seconds, head turns R and L x1 min, look up/down x 30 seconds  Sit to stand with 360 deg turns R/L 3x ea Standing tandem stance x 5 trials 5-10 sec ea foot placement (PT supervision) Standing alternating foot taps on step: 2x8 (6 inch height) Mini squats with chair behind her 2x8 Standing marches 2x10 LAQ: R 4# 2x10, L 3# 2x10 Standing toe taps on steps: x15 alternating, PT cues for "stand tall" Standing scapular retractions 2x10 with PT verbal/tactile cues  Seated shoulder flexion with PT verbal cues for upright posture Hurdles: forward 2 laps in parallel bars Alternating marches 2x10 same weight as above, seated, x15 ea Amb x 4 min with SPC at end and PT supervision on flat surface to car- PT verbal cues to "stand tall" Seated marches L  2x10 5 lb on distal femur 6 min walk test: stopped at 5:40 today due to pt reporting LE L>R fatigue (340 ft) with SPC and PT supervision  Pt education: reviewed upright posture during ambulation today with cues for "stand tall"   HEP Access Code: [email or web address] URL: [email or web address] Date: 10/06/2023 Prepared by: Max Fickle  Exercises - Seated Hip Adduction Isometrics with Newman Pies  - 1 x daily - 7 x weekly - 2 sets - 10 reps - 5 hold - Seated Hip Abduction with Resistance  - 1 x daily - 7 x weekly - 2 sets - 10 reps - 5 hold - Seated March  - 1 x daily - 7 x weekly - 2 sets - 10 reps - Seated Heel Toe Raises  - 2 x daily - 7 x weekly - 2 sets - 15 reps - Sit to Stand  - 2 x daily - 7 x weekly - 2 sets - 5 reps   PATIENT EDUCATION: Education details: PT POC/goals, purpose of PT to address strength/balance/gait impairments and to reduce  fall risk/improve safety during amb and ADLs at home Person educated: Patient Education method: Explanation Education comprehension: verbalized understanding  HOME EXERCISE PROGRAM: [email or web address]  GOALS: Goals reviewed with patient? Yes  SHORT TERM GOALS: Target date: 10/07/23  Pt will be instructed on strategies to improve home enrivonment safety during amb at home to promote safe independent living with reduced risk for falls (lighting, throw rugs, obstacles, furniture placement) Baseline: initiated during evaluation today Goal status: INITIAL   LONG TERM GOALS: Target date: 10/27/22  Administer FOTO at next visit and set appropriate FOTO goal Baseline: will be set at visit #2- 11/03/23 DC this goal Goal status: DC this goal  2.  Improve L LE strength 1/2 MMT grade to promote improved ability to amb without SPC at [facility] for short distances around her home Baseline: using SPC for amb at self selected height, has  not been properly fitted yet; PT adjusted height of SPC to appropriate level today at end of evaluation appointment; 11/03/23: 4-/5 hip flex Goal status: INITIAL  3.  Pt will be able to peform 5x STS in <15 seconds indicating reduced risk for falling Baseline: 26 seconds; 11/03/23: 17 seconds Goal status: INITIAL   4. Pt will be able to amb x >6 min without taking a rest break in 6 MWT to facilitate improved ability to ambulate community distances in/out of stores, to/from her car, and with her dog for taking care of her dog independently.   Baseline: 5:40 min (340 ft)    Goal status: new  5. Improve BERG to >49 indicating reduced risk for falls  Baseline: 35/56 (11/05/23)  Goal status: new ASSESSMENT:  CLINICAL IMPRESSION: Continued working on improving standing and walking endurance in session today.  Able to tolerate 10 minute bout of standing LE strengthening before taking a seated break.  Tx was modified due to vertigo as pt has not yet scheduled appointment for further  tx for R posterior canalithiasis.  Discussed importance of scheduling this at end of session today and pt got scheduled.  She should continue to benefit from consistent skilled PT to improve strength, balance, activity tolerance, and walking tolerance to facilitate improved safety during ambulation and improved ability to perform her daily activities at [facility] before her CVA.  OBJECTIVE IMPAIRMENTS: Abnormal gait, decreased activity tolerance, decreased balance, decreased endurance, decreased mobility, difficulty walking, decreased strength, decreased safety awareness, and impaired perceived functional ability.   ACTIVITY LIMITATIONS: carrying, lifting, standing, squatting, stairs, and locomotion level  PARTICIPATION LIMITATIONS: meal prep, cleaning, laundry, interpersonal relationship, shopping, and community activity  PERSONAL FACTORS: Time since onset of injury/illness/exacerbation and 1-2 comorbidities: h/o lumbar DJD and possible h/o hip fx (unclear from pt report today)  are also affecting patient's functional outcome.   REHAB POTENTIAL: Good  CLINICAL DECISION MAKING: Stable/uncomplicated  EVALUATION COMPLEXITY: Low  PLAN:  PT FREQUENCY: 2x/week  PT DURATION: 6 weeks  PLANNED INTERVENTIONS: 97110-Therapeutic exercises, 97530- Therapeutic activity, 97112- Neuromuscular re-education, 97535- Self Care, and 16109- Manual therapy  PLAN FOR NEXT SESSION: LE strengthening; balance exercises, continue focusing on sit to stand, step ups, monitor BP; continue working on walking endurance and standing endurance, tx for BPPV  Max Fickle, PT, DPT, OCS Ardine Bjork, PT 12/03/2023, 2:16 PM

## 2023-12-05 ENCOUNTER — Encounter: Payer: Self-pay | Admitting: Neurology

## 2023-12-07 NOTE — Progress Notes (Unsigned)
 12/08/2023 9:43 PM   Yvette [name] [birth year] [id number]  Referring provider: Smitty Cords, DO [address] [place],  [place] [address]  Urological history: 1. GSM -contributing factors of age,  -Vaginal estrogen cream 3 nights weekly  2. OAB -Contributing factors of age, GSM, diabetes, hypertension, obesity, lumbar radiculopathy, anxiety, possible sleep apnea, CVA and history of smoking -Gemtesa 75 mg daily   No chief complaint on file.  HPI: Yvette [name] is a 78 y.o. female who presents today for  three month follow up.    Previous records reviewed.   At her visit on 10/20/2023, she is having 1-7 daytime voids, strong to severe urge to urinate.  She has urge incontinence.  She is leaking 1-2 times a day.  She wears panty liners 1-3 a day and absorbent depends 1-2 times a day.  She does limit fluid intake.  She does engage in toilet mapping.  Patient denies any modifying or aggravating factors.  Patient denies any recent UTI's, gross hematuria, dysuria or suprapubic/flank pain.  Patient denies any fevers, chills, nausea or vomiting.   She did not take any Gemtesa medication.  She states she did not receive samples and a prescription was not called in.  She is using the vaginal estrogen cream.  She is having neurological issues at this time that are more pressing.  PVR 12 mL .  She was given Myrbetriq 50 mg samples and scheduled for follow-up.  PVR ***    PMH: Past Medical History:  Diagnosis Date   Arthropathy, unspecified, site unspecified    Esophageal reflux    Osteoarthritis    Osteoporosis, unspecified    Other abnormal glucose    Other and unspecified hyperlipidemia    Symptomatic menopausal or female climacteric states    Syncope and collapse     Surgical History: Past Surgical History:  Procedure Laterality Date   ABDOMINAL HYSTERECTOMY     APPENDECTOMY     BREAST BIOPSY Left 1970's   neg   COLONOSCOPY     CYST REMOVAL TRUNK  11/24/2020    from back   FINGER FRACTURE SURGERY      Home Medications:  Allergies as of 12/08/2023       Reactions   Simvastatin Other (See Comments)   Myalgia, muscle aches lower extremity   Erythromycin Nausea And Vomiting        Medication List        Accurate as of December 07, 2023  9:43 PM. If you have any questions, ask your nurse or doctor.          albuterol 108 (90 Base) MCG/ACT inhaler Commonly known as: VENTOLIN HFA Inhale 2 puffs into the lungs every 4 (four) hours as needed for wheezing or shortness of breath (cough).   amLODipine 10 MG tablet Commonly known as: NORVASC Take 1 tablet (10 mg total) by mouth daily.   ascorbic acid 500 MG tablet Commonly known as: VITAMIN C Take by mouth.   beta carotene w/minerals tablet Take 1 tablet by mouth daily.   carvedilol 12.5 MG tablet Commonly known as: COREG Take 1 tablet (12.5 mg total) by mouth 2 (two) times daily with a meal.   Cholecalciferol 10 MCG (400 UNIT) Caps Take 1 tablet by mouth daily with breakfast.   clopidogrel 75 MG tablet Commonly known as: PLAVIX Take 1 tablet (75 mg total) by mouth daily.   estradiol 0.1 MG/GM vaginal cream Commonly known as: ESTRACE Estrogen Cream Instruction  Discard applicator  Apply pea sized amount to tip of finger to urethra before bed. Wash hands well after application. Use Monday, Wednesday and Friday   fluticasone 50 MCG/ACT nasal spray Commonly known as: FLONASE Place 2 sprays into both nostrils daily.   furosemide 20 MG tablet Commonly known as: LASIX Take 1-2 tablets (20-40 mg total) by mouth daily as needed for fluid or edema. Recommend use up to 3-5 days at a time if needed.   gabapentin 300 MG capsule Commonly known as: NEURONTIN Take 2 capsules (600 mg total) by mouth at bedtime as needed.   hydrochlorothiazide 25 MG tablet Commonly known as: HYDRODIURIL Take 1 tablet (25 mg total) by mouth daily.   irbesartan 300 MG tablet Commonly known as:  AVAPRO Take 1 tablet (300 mg total) by mouth daily.   Krill Oil 300 MG Caps Take by mouth daily.   loratadine 10 MG tablet Commonly known as: CLARITIN Take 10 mg by mouth daily.   mirabegron ER 50 MG Tb24 tablet Commonly known as: MYRBETRIQ Take 1 tablet (50 mg total) by mouth daily.   multivitamin tablet Take 1 tablet by mouth daily.   pantoprazole 40 MG tablet Commonly known as: PROTONIX Take 1 tablet (40 mg total) by mouth daily before breakfast.   POTASSIUM & MAGNESIUM ASPARTAT PO Take 1 tablet by mouth daily.   Repatha SureClick 140 MG/ML Soaj Generic drug: Evolocumab Inject 140 mg into the skin every 14 (fourteen) days.   traMADol 50 MG tablet Commonly known as: ULTRAM Take 1 tablet (50 mg total) by mouth 3 (three) times daily as needed for up to 5 days for moderate pain (pain score 4-6) or severe pain (pain score 7-10).   TYLENOL 8 HOUR ARTHRITIS PAIN PO Take by mouth. 2 in am 1 in pm        Allergies:  Allergies  Allergen Reactions   Simvastatin Other (See Comments)    Myalgia, muscle aches lower extremity   Erythromycin Nausea And Vomiting    Family History: Family History  Problem Relation Age of Onset   Diabetes Mother    Heart disease Mother    Heart attack Mother    Ulcers Father    Gallstones Father    Heart attack Brother    Migraines Neg Hx    Dementia Neg Hx     Social History:  reports that she quit smoking about 31 years ago. Her smoking use included cigarettes. She started smoking about 57 years ago. She has a 26 pack-year smoking history. She has been exposed to tobacco smoke. She has quit using smokeless tobacco. She reports that she does not currently use alcohol. She reports that she does not use drugs.  ROS: Pertinent ROS in HPI  Physical Exam: There were no vitals taken for this visit.  Constitutional:  Well nourished. Alert and oriented, No acute distress. HEENT: [place] AT, moist mucus membranes.  Trachea  midline Cardiovascular: No clubbing, cyanosis, or edema. Respiratory: Normal respiratory effort, no increased work of breathing. GU: No CVA tenderness.  No bladder fullness or masses.  Recession of labia minora, dry, pale vulvar vaginal mucosa and loss of mucosal ridges and folds.  Normal urethral meatus, no lesions, no prolapse, no discharge.   No urethral masses, tenderness and/or tenderness. No bladder fullness, tenderness or masses. *** vagina mucosa, *** estrogen effect, no discharge, no lesions, *** pelvic support, *** cystocele and *** rectocele noted.  No cervical motion tenderness.  Uterus is freely mobile and non-fixed.  No adnexal/parametria masses or tenderness noted.  Anus and perineum are without rashes or lesions.   ***  Neurologic: Grossly intact, no focal deficits, moving all 4 extremities. Psychiatric: Normal mood and affect.    Laboratory Data: N/A  Pertinent Imaging: ***   Assessment & Plan:    1. OAB -***  2. GSM -Continue vaginal estrogen cream 3 nights weekly   No follow-ups on file.  These notes generated with voice recognition software. I apologize for typographical errors.  Cloretta Ned  [facility] [address]  [address] [place], [place] [address] [phone]

## 2023-12-08 ENCOUNTER — Encounter: Payer: Self-pay | Admitting: Urology

## 2023-12-08 ENCOUNTER — Ambulatory Visit: Payer: Medicare HMO | Admitting: Urology

## 2023-12-08 ENCOUNTER — Ambulatory Visit: Payer: Medicare HMO

## 2023-12-08 VITALS — BP 145/77 | HR 82 | Ht 65.0 in | Wt 213.0 lb

## 2023-12-08 DIAGNOSIS — N952 Postmenopausal atrophic vaginitis: Secondary | ICD-10-CM

## 2023-12-08 DIAGNOSIS — N3281 Overactive bladder: Secondary | ICD-10-CM | POA: Diagnosis not present

## 2023-12-08 LAB — BLADDER SCAN AMB NON-IMAGING

## 2023-12-08 MED ORDER — ESTRADIOL 0.1 MG/GM VA CREA: TOPICAL_CREAM | VAGINAL | 3 refills | Status: AC

## 2023-12-12 ENCOUNTER — Other Ambulatory Visit: Payer: Self-pay | Admitting: Neurology

## 2023-12-14 ENCOUNTER — Encounter: Payer: Self-pay | Admitting: Family Medicine

## 2023-12-14 DIAGNOSIS — M47816 Spondylosis without myelopathy or radiculopathy, lumbar region: Secondary | ICD-10-CM

## 2023-12-15 MED ORDER — GABAPENTIN 300 MG PO CAPS: 900.0000 mg | ORAL_CAPSULE | Freq: Every day | ORAL | 1 refills | Status: AC

## 2023-12-15 NOTE — Addendum Note (Signed)
 Addended by: Smitty Cords on: 12/15/2023 05:24 PM   Modules accepted: Orders

## 2023-12-17 ENCOUNTER — Ambulatory Visit: Payer: Medicare HMO

## 2023-12-23 ENCOUNTER — Encounter: Payer: Self-pay | Admitting: Family Medicine

## 2023-12-23 DIAGNOSIS — M47816 Spondylosis without myelopathy or radiculopathy, lumbar region: Secondary | ICD-10-CM

## 2023-12-23 DIAGNOSIS — M150 Primary generalized (osteo)arthritis: Secondary | ICD-10-CM

## 2023-12-23 MED ORDER — TRAMADOL HCL 50 MG PO TABS: 50.0000 mg | ORAL_TABLET | Freq: Three times a day (TID) | ORAL | 0 refills | Status: DC | PRN

## 2023-12-24 ENCOUNTER — Ambulatory Visit: Payer: Medicare HMO

## 2023-12-29 ENCOUNTER — Ambulatory Visit

## 2023-12-29 ENCOUNTER — Encounter: Payer: Self-pay | Admitting: Family Medicine

## 2023-12-29 ENCOUNTER — Institutional Professional Consult (permissible substitution): Payer: Medicare HMO | Admitting: Neurology

## 2023-12-29 DIAGNOSIS — R110 Nausea: Secondary | ICD-10-CM

## 2023-12-29 MED ORDER — ONDANSETRON 4 MG PO TBDP: 4.0000 mg | ORAL_TABLET | Freq: Three times a day (TID) | ORAL | 0 refills | Status: DC | PRN

## 2023-12-30 ENCOUNTER — Ambulatory Visit: Payer: Medicare HMO | Attending: Internal Medicine

## 2023-12-30 DIAGNOSIS — R42 Dizziness and giddiness: Secondary | ICD-10-CM | POA: Insufficient documentation

## 2023-12-30 NOTE — Therapy (Signed)
 OUTPATIENT PHYSICAL THERAPY NEURO TREATMENT/RECERTIFICATION   Patient Name: Yvette [name] MRN: [id number] DOB:03-27-1946, 78 y.o., female Today's Date: 12/30/2023  PCP: Dr. Althea Charon, DO REFERRING PROVIDER: Nicki Reaper, NP  END OF SESSION:09/10/23  PT End of Session - 12/30/23 1454     Visit Number 14    Number of Visits 49    Date for PT Re-Evaluation 03/23/24    Authorization Type updated cert/PN done on 11/03/23- 2x/week x 6 weeks (12/15/23); Medicare PN at visit #20    PT Start Time 1452    PT Stop Time 1522    PT Time Calculation (min) 30 min    Equipment Utilized During Treatment Gait belt    Activity Tolerance Patient tolerated treatment well    Behavior During Therapy WFL for tasks assessed/performed             Past Medical History:  Diagnosis Date   Arthropathy, unspecified, site unspecified    Esophageal reflux    Osteoarthritis    Osteoporosis, unspecified    Other abnormal glucose    Other and unspecified hyperlipidemia    Symptomatic menopausal or female climacteric states    Syncope and collapse    Past Surgical History:  Procedure Laterality Date   ABDOMINAL HYSTERECTOMY     APPENDECTOMY     BREAST BIOPSY Left 1970's   neg   COLONOSCOPY     CYST REMOVAL TRUNK  11/24/2020   from back   FINGER FRACTURE SURGERY     Patient Active Problem List   Diagnosis Date Noted   Cerebrovascular accident (CVA) (HCC) 11/18/2023   Spinal stenosis of lumbar region with neurogenic claudication 11/18/2023   History of cerebrovascular accident (CVA) with residual deficit 08/28/2023   Left hip pain 06/19/2022   Lumbar radiculopathy 04/02/2022   Gait abnormality 04/02/2022   Urinary urgency 04/02/2022   Familial hyperlipidemia 12/07/2020   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   Abscess 04/07/2020   Venous insufficiency of both lower extremities 12/01/2019   Suspected sleep apnea 07/13/2018   Former smoker 07/13/2018   Multiple renal cysts 11/12/2017   Vitamin  D deficiency 10/15/2017   Spondylosis of lumbar region without myelopathy or radiculopathy 06/10/2017   Chronic right-sided low back pain with sciatica 06/10/2017   Primary osteoarthritis involving multiple joints 06/10/2017   BPPV (benign paroxysmal positional vertigo), right 06/10/2017   Nausea without vomiting 06/10/2017   Environmental and seasonal allergies 02/12/2017   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/2017   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   Dupuytren's contracture of left hand 10/14/2016   Anxiety disorder 09/03/2016   GERD (gastroesophageal reflux disease) 09/03/2016   Prophylactic use of raloxifene (Evista) 09/03/2016   Hyperlipidemia associated with type 2 diabetes mellitus (HCC) 09/03/2016   Type 2 diabetes mellitus with other specified complication (HCC) 09/12/2015   Dyspnea 07/23/2013   Essential hypertension     ONSET DATE: October 2024  REFERRING DIAG: R29.898 (ICD-10-CM) - Bilateral leg weakness   I69.30 (ICD-10-CM) - History of cerebrovascular accident (CVA) with residual deficit   THERAPY DIAG:  Dizziness and giddiness - Plan: PT plan of care cert/re-cert  Rationale for Evaluation and Treatment: Rehabilitation  SUBJECTIVE: From initial evaluation on 09/08/23  SUBJECTIVE STATEMENT: Pt reports at end of October, 2024 (does not recall exact day) she was getting into bed and she felt like she stepped up on the step stool to get into bed and when she turned around she felt off balance and fell onto the floor. She reports she had difficulty moving her L LE and it felt a little weaker.  She reports she did not injure anything.  She crawled to her cedar chest and used that to push herself up from and used her R leg to stand up.  She reports she did not call any family or neighbors and she slept in  the recliner chair that day.    She called her PCP the next week (few days later) to check in.  She was evaluated by PCP and referred to PT. Pt states she was evaluated and referred to PT because the "episode" was a stroke.  She has not seen any other providers for this.  Current function: intermittently she feels like she is "dragging her L leg" since this incident.  Her L foot has caught a few times but she has not fallen since then.    She is now amb with SPC, she reports she did not use the [facility] prior to this "episode".  She feels like it is helpful when she gets tired.    Pt accompanied by: self; she states she has family that live close by (daughter)  PERTINENT HISTORY: pt reports she has h/o LBP (DJD)  and difficulty with prolonged standing/walking  intermittently; osteopenia; and h/o stable L hip fx; unclear which hip though- upon chart review the xray results from 03/2022 states stable R hip fx on impression; pt states she has seen orthopedist for this and opted to not pursue surgery.  PAIN:  Are you having pain? Not today  PRECAUTIONS: Fall  RED FLAGS: None   WEIGHT BEARING RESTRICTIONS: No  FALLS: Has patient fallen in last 6 months? Yes, 1x (see above)  LIVING ENVIRONMENT: Lives with: lives with their family and lives alone, pt is a widow; daughter "Arline Asp" lives 4 miles; has a Nurse, mental health at home Lives in: [employer]: 2 steps to enter home on deck, with railings on back of home; has 5 steps in front of home with railing  Has following equipment at home: Single point cane  PLOF: Independent  PATIENT GOALS: to be able to walk longer and with improved balance inside and outside of her home  OBJECTIVE:  Note: Objective measures were completed at Evaluation unless otherwise noted.  DIAGNOSTIC FINDINGS: per chart review: brain MRI on 08/07/23: FINDINGS: Brain: There is a 1.3 cm acute infarct in the right corona radiata. Chronic infarcts are noted in the left corona  radiata, right basal ganglia, and both thalami. Small T2 hyperintensities elsewhere in the cerebral white matter bilaterally are nonspecific but compatible with mild chronic small vessel ischemic disease. There is a single chronic microhemorrhage in the right cerebellar hemisphere. No mass, midline shift, or extra-axial fluid collection is identified. There is mild generalized cerebral atrophy. IMPRESSION: 1. Acute infarct in the right corona radiata. 2. 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    COGNITION: Overall cognitive status: Within functional limits for tasks assessed   SENSATION: WFL  COORDINATION: Pt demonstrated normal coordination with finger to nose with PT and LE heel/shin motion  POSTURE: rounded shoulders  LOWER EXTREMITY ROM:     Active  Right Eval Left Eval  Hip flexion [facility]  for sit to stand and amb [facility] for sit to stand and amb  Hip extension    Hip abduction    Hip adduction    Hip internal rotation    Hip external rotation    Knee flexion    Knee extension    Ankle dorsiflexion    Ankle plantarflexion    Ankle inversion    Ankle eversion     (Blank rows = not tested)  LOWER EXTREMITY MMT:    MMT Right Eval Left Eval  Hip flexion 5 4  Hip extension    Hip abduction 4 4  Hip adduction    Hip internal rotation 4+ 4  Hip external rotation 4+ 4  Knee flexion 5 4  Knee extension 5 4  Ankle dorsiflexion 5 4  Ankle plantarflexion    Ankle inversion    Ankle eversion    (Blank rows = not tested)  BED MOBILITY:  Will be assessed at future visit  TRANSFERS: Assistive device utilized: SPC during amb Sit to stand: Complete Independence Stand to sit: Complete Independence Chair to chair: able to without SPC, uses b/l UE support Floor: deferred today  STAIRS: Will assess at future visit  GAIT: Gait pattern: wide BOS and poor foot clearance- Left Distance walked: pt amb in/out of clinic  independently Assistive device utilized: Single point cane Level of assistance: Complete Independence Comments: pt amb with decreased gait speed; states she feels fatigued more easily during amb than prior to October  FUNCTIONAL TESTS:  5 times sit to stand: 26 seconds (indicating increased fall risk); pt uses UE support Additional balance testing will be administered at future visit  PATIENT SURVEYS:  FOTO will be administered at future visit; clinic lost electricity/internet during pt's appointment today  TODAY'S TREATMENT:                                                                                                                              DATE: 12/30/23   Subjective: Pt reports that she is doing well today. She has continued with episodes of vertigo in bed. No specific questions or concerns.     Pain: Not asked (unrelated to BPPV treatment)   Canalith Repositioning Treatment Inverted mat table utilized for all CRT. Positive R Dix-Hallpike test for vertigo and upbeating R torsional nystagmus lasting approximately 30s. Pt treated with 3 bouts of Epley Maneuver for presumed R posterior canal BPPV. 90s holds in each position and retesting between maneuvers. All retesting is negative for both vertigo and nystagmus after the first maneuver. Even though retesting is negative therapist completed 3 full maneuvers.     HEP Access Code: [email or web address] URL: [email or web address] Date: 10/06/2023 Prepared by: Max Fickle  Exercises - Seated Hip Adduction Isometrics with Newman Pies  - 1 x daily - 7 x weekly - 2 sets - 10 reps - 5 hold - Seated Hip Abduction with Resistance  - 1 x daily - 7 x weekly - 2  sets - 10 reps - 5 hold - Seated March  - 1 x daily - 7 x weekly - 2 sets - 10 reps - Seated Heel Toe Raises  - 2 x daily - 7 x weekly - 2 sets - 15 reps - Sit to Stand  - 2 x daily - 7 x weekly - 2 sets - 5 reps   PATIENT EDUCATION: Education details: BPPV Person educated:  Patient Education method: Explanation Education comprehension: verbalized understanding  HOME EXERCISE PROGRAM: [email or web address]  GOALS: Goals reviewed with patient? Yes  SHORT TERM GOALS: Target date: 02/10/2024   Pt will be instructed on strategies to improve home enrivonment safety during amb at home to promote safe independent living with reduced risk for falls (lighting, throw rugs, obstacles, furniture placement) Baseline: initiated during evaluation today Goal status: INITIAL   LONG TERM GOALS: Target date: 03/23/2024   Administer FOTO at next visit and set appropriate FOTO goal Baseline: will be set at visit #2- 11/03/23 DC this goal Goal status: DC this goal  2.  Improve L LE strength 1/2 MMT grade to promote improved ability to amb without SPC at [facility] for short distances around her home Baseline: using SPC for amb at self selected height, has not been properly fitted yet; PT adjusted height of SPC to appropriate level today at end of evaluation appointment; 11/03/23: 4-/5 hip flex Goal status: INITIAL  3.  Pt will be able to peform 5x STS in <15 seconds indicating reduced risk for falling Baseline: 26 seconds; 11/03/23: 17 seconds Goal status: INITIAL   4. Pt will be able to amb x >6 min without taking a rest break in 6 MWT to facilitate improved ability to ambulate community distances in/out of stores, to/from her car, and with her dog for taking care of her dog independently.   Baseline: 5:40 min (340 ft)    Goal status: new  5. Improve BERG to >49 indicating reduced risk for falls  Baseline: 35/56 (11/05/23)  Goal status: new ASSESSMENT:  CLINICAL IMPRESSION: Pt arrived today for canalith repositioning treatment related to her BPPV that was screened a few weeks ago. She continues with a positive R Dix-Hallpike Test today for both vertigo and appropriate nystagmus. Pt treated with 3 bouts of Epley Maneuver for presumed R posterior canal BPPV. 90s holds in each position and  retesting between maneuvers. All retesting is negative for both vertigo and nystagmus after the first maneuver. Even though retesting is negative therapist completed 3 full maneuvers. Deferred updating outcome measures/goals on this date due to BPPV treatment however they will be updated at future appointment. However when goals were updated in January they showed improvement in LE strength. She should continue to benefit from consistent skilled PT to improve strength, balance, activity tolerance, and walking tolerance to facilitate improved safety during ambulation and improved ability to perform her daily activities at [facility] before her CVA.  OBJECTIVE IMPAIRMENTS: Abnormal gait, decreased activity tolerance, decreased balance, decreased endurance, decreased mobility, difficulty walking, decreased strength, decreased safety awareness, and impaired perceived functional ability.   ACTIVITY LIMITATIONS: carrying, lifting, standing, squatting, stairs, and locomotion level  PARTICIPATION LIMITATIONS: meal prep, cleaning, laundry, interpersonal relationship, shopping, and community activity  PERSONAL FACTORS: Time since onset of injury/illness/exacerbation and 1-2 comorbidities: h/o lumbar DJD and possible h/o hip fx (unclear from pt report today)  are also affecting patient's functional outcome.   REHAB POTENTIAL: Good  CLINICAL DECISION MAKING: Stable/uncomplicated  EVALUATION COMPLEXITY: Low  PLAN:  PT FREQUENCY: 2x/week  PT DURATION: 12 weeks  PLANNED INTERVENTIONS: 97110-Therapeutic exercises, 97530- Therapeutic activity, [id number]- Neuromuscular re-education, 97535- Self Care, and 16109- Manual therapy  PLAN FOR NEXT SESSION: BPPV testing and treatment if indicated, LE strengthening; balance exercises, continue focusing on sit to stand, step ups, monitor BP; continue working on walking endurance and standing endurance, tx for BPPV  Sharalyn Ink Rahil Passey PT, DPT, GCS  Kenyon Eichelberger, PT 12/30/2023,  3:24 PM

## 2024-01-06 ENCOUNTER — Other Ambulatory Visit: Payer: Self-pay

## 2024-01-06 DIAGNOSIS — Z0000 Encounter for general adult medical examination without abnormal findings: Secondary | ICD-10-CM

## 2024-01-06 DIAGNOSIS — I10 Essential (primary) hypertension: Secondary | ICD-10-CM

## 2024-01-06 DIAGNOSIS — E1169 Type 2 diabetes mellitus with other specified complication: Secondary | ICD-10-CM

## 2024-01-07 LAB — CBC WITH DIFFERENTIAL/PLATELET
Absolute Lymphocytes: 1053 {cells}/uL (ref 850–3900)
Absolute Monocytes: 423 {cells}/uL (ref 200–950)
Basophils Absolute: 32 {cells}/uL (ref 0–200)
Basophils Relative: 0.7 %
Eosinophils Absolute: 90 {cells}/uL (ref 15–500)
Eosinophils Relative: 2 %
HCT: 39.3 % (ref 35.0–45.0)
Hemoglobin: 13.1 g/dL (ref 11.7–15.5)
MCH: 29.8 pg (ref 27.0–33.0)
MCHC: 33.3 g/dL (ref 32.0–36.0)
MCV: 89.5 fL (ref 80.0–100.0)
MPV: 11.5 fL (ref 7.5–12.5)
Monocytes Relative: 9.4 %
Neutro Abs: 2903 {cells}/uL (ref 1500–7800)
Neutrophils Relative %: 64.5 %
Platelets: 211 10*3/uL (ref 140–400)
RBC: 4.39 10*6/uL (ref 3.80–5.10)
RDW: 13.4 % (ref 11.0–15.0)
Total Lymphocyte: 23.4 %
WBC: 4.5 10*3/uL (ref 3.8–10.8)

## 2024-01-07 LAB — COMPLETE METABOLIC PANEL WITHOUT GFR
AG Ratio: 1.9 (calc) (ref 1.0–2.5)
ALT: 7 U/L (ref 6–29)
AST: 12 U/L (ref 10–35)
Albumin: 4.3 g/dL (ref 3.6–5.1)
Alkaline phosphatase (APISO): 77 U/L (ref 37–153)
BUN/Creatinine Ratio: 21 (calc) (ref 6–22)
BUN: 12 mg/dL (ref 7–25)
CO2: 30 mmol/L (ref 20–32)
Calcium: 9.5 mg/dL (ref 8.6–10.4)
Chloride: 103 mmol/L (ref 98–110)
Creat: 0.56 mg/dL — ABNORMAL LOW (ref 0.60–1.00)
Globulin: 2.3 g/dL (ref 1.9–3.7)
Glucose, Bld: 135 mg/dL — ABNORMAL HIGH (ref 65–99)
Potassium: 4.3 mmol/L (ref 3.5–5.3)
Sodium: 142 mmol/L (ref 135–146)
Total Bilirubin: 0.9 mg/dL (ref 0.2–1.2)
Total Protein: 6.6 g/dL (ref 6.1–8.1)

## 2024-01-07 LAB — LIPID PANEL
Cholesterol: 221 mg/dL — ABNORMAL HIGH (ref <200)
HDL: 48 mg/dL — ABNORMAL LOW (ref >=50)
LDL Cholesterol (Calc): 133 mg/dL — ABNORMAL HIGH
Non-HDL Cholesterol (Calc): 173 mg/dL — ABNORMAL HIGH (ref <130)
Total CHOL/HDL Ratio: 4.6 (calc) (ref <5.0)
Triglycerides: 247 mg/dL — ABNORMAL HIGH (ref <150)

## 2024-01-07 LAB — HEMOGLOBIN A1C
Hgb A1c MFr Bld: 6.1 %{Hb} — ABNORMAL HIGH (ref <5.7)
Mean Plasma Glucose: 128 mg/dL
eAG (mmol/L): 7.1 mmol/L

## 2024-01-07 LAB — TSH: TSH: 3.85 m[IU]/L (ref 0.40–4.50)

## 2024-01-13 ENCOUNTER — Encounter: Payer: Self-pay | Admitting: Family Medicine

## 2024-01-14 ENCOUNTER — Ambulatory Visit: Admitting: Family Medicine

## 2024-01-14 ENCOUNTER — Encounter: Payer: Self-pay | Admitting: Family Medicine

## 2024-01-14 VITALS — BP 130/60 | HR 66 | Ht 65.0 in | Wt 202.0 lb

## 2024-01-14 DIAGNOSIS — I10 Essential (primary) hypertension: Secondary | ICD-10-CM | POA: Diagnosis not present

## 2024-01-14 DIAGNOSIS — E1169 Type 2 diabetes mellitus with other specified complication: Secondary | ICD-10-CM

## 2024-01-14 DIAGNOSIS — M47816 Spondylosis without myelopathy or radiculopathy, lumbar region: Secondary | ICD-10-CM | POA: Diagnosis not present

## 2024-01-14 DIAGNOSIS — G8929 Other chronic pain: Secondary | ICD-10-CM

## 2024-01-14 DIAGNOSIS — E785 Hyperlipidemia, unspecified: Secondary | ICD-10-CM

## 2024-01-14 DIAGNOSIS — H8111 Benign paroxysmal vertigo, right ear: Secondary | ICD-10-CM

## 2024-01-14 DIAGNOSIS — Z0000 Encounter for general adult medical examination without abnormal findings: Secondary | ICD-10-CM

## 2024-01-14 DIAGNOSIS — M150 Primary generalized (osteo)arthritis: Secondary | ICD-10-CM | POA: Diagnosis not present

## 2024-01-14 DIAGNOSIS — M48062 Spinal stenosis, lumbar region with neurogenic claudication: Secondary | ICD-10-CM

## 2024-01-14 DIAGNOSIS — I6930 Unspecified sequelae of cerebral infarction: Secondary | ICD-10-CM

## 2024-01-14 DIAGNOSIS — M542 Cervicalgia: Secondary | ICD-10-CM

## 2024-01-14 MED ORDER — AMLODIPINE BESYLATE 10 MG PO TABS: 10.0000 mg | ORAL_TABLET | Freq: Every day | ORAL | 3 refills | Status: AC

## 2024-01-14 MED ORDER — HYDROCHLOROTHIAZIDE 25 MG PO TABS: 25.0000 mg | ORAL_TABLET | Freq: Every day | ORAL | 3 refills | Status: AC

## 2024-01-14 MED ORDER — IRBESARTAN 300 MG PO TABS: 300.0000 mg | ORAL_TABLET | Freq: Every day | ORAL | 3 refills | Status: AC

## 2024-01-14 MED ORDER — TRAMADOL HCL 50 MG PO TABS: 50.0000 mg | ORAL_TABLET | Freq: Three times a day (TID) | ORAL | 0 refills | Status: DC | PRN

## 2024-01-14 NOTE — Patient Instructions (Addendum)
 Thank you for coming to the office today.  Tramadol re order inc pill count for 60 pills or twice a day  Repatha sample if you need  Labs overall look good.  Keep an eye on the weight.  Please schedule a Follow-up Appointment to: Return for 6 month DM A1c.  If you have any other questions or concerns, please feel free to call the office or send a message through MyChart. You may also schedule an earlier appointment if necessary.  Additionally, you may be receiving a survey about your experience at our office within a few days to 1 week by e-mail or mail. We value your feedback.  Saralyn Pilar, DO [facility], [place]

## 2024-01-14 NOTE — Progress Notes (Addendum)
 Subjective:    Patient ID: Yvette [name], female    DOB: [birth year], 78 y.o.   MRN: [id number]  Yvette [name] is a 78 y.o. female presenting on 01/14/2024 for Annual Exam   HPI  Discussed the use of AI scribe software for clinical note transcription with the patient, who gave verbal consent to proceed.  History of Present Illness   Yvette [name] is a 78 year old female who presents for a general checkup.  She has completed her foot check, sugar check, and urine test. She received a pneumonia shot at a pharmacy around 2022, but plans to confirm the exact date. She had an eye exam in February at [facility], where it was noted that her cataracts are not ready for surgery, and she only requires over-the-counter reading glasses.   She has lost 10 pounds since the end of March, attributing this to reduced food intake and cutting back on sweets. She practices portion control by taking home half of her meals when dining out.      Type 2 Diabetes / Obesity BMI >33 Improvement overall.  Remains Off Metformin Last A1c 6.1 (down from 6.4) Currently on ACEi, ASA 81, statin Lifestyle: - Diet goal to improve diet still. She does admit some candy increased at times. - Exercise: Improved activity with some wt loss Admits occasional tingling in feet. Denies hypoglycemia, visual changes, numbness .  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 Last lipid LDL down to 133 (prior 169-170) Has failed Statin therapy, Zetia, Rosuvastatin She has famlilal cholesterol history and hyperTG On Repatha q 2 weeks inj, had missed doses due to pen  malfunction  Lumbar Radiculopathy Osteoarthritis hips Followed by Ortho and Neurology She had nerve testing by Dr Terrace Arabia, no surgery needed. 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 Some chronic neck pain She experiences neck stiffness and pain radiating through her shoulders, particularly when turning her neck to the right or leaning her head back. She takes tramadol at night and has recently started taking it in the morning as well. She also takes gabapentin at night. Improves on Tramadol and Gabapentin Needs re order   BPPV nausea vertigo, has upcoming apt with therapist / neuro for update on this She has ongoing issues with equilibrium due to inner ear problems, which have been treated with a reset procedure two weeks ago. She continues to experience dizziness and is scheduled for a follow-up.  Health Maintenance:  Prevnar-20 vaccine 2022 approximately, she will check record     01/14/2024   10:47 AM 09/17/2023   10:52 AM 01/17/2023    8:22 AM  Depression screen PHQ 2/9  Decreased Interest 0 0 0  Down, Depressed, Hopeless 0 0 0  PHQ - 2 Score 0 0 0  Altered sleeping 0  0  Tired, decreased energy 1  0  Change in appetite 0  0  Feeling bad or failure about yourself  0  0  Trouble concentrating 0  0  Moving slowly or fidgety/restless 0  0  Suicidal thoughts 0  0  PHQ-9 Score 1  0  Difficult doing work/chores Not difficult at all  Not difficult at all       01/14/2024   10:47 AM 09/17/2023   10:52 AM 01/01/2023   11:10 AM 07/03/2022   11:13 AM  GAD 7 : Generalized Anxiety Score  Nervous, Anxious, on Edge 1 0 0 0  Control/stop worrying 0 0 0 0  Worry too much - different things 0 0 0 0  Trouble relaxing 0 0 0 0  Restless 0 0 0 0  Easily annoyed or  irritable 0 0 0 0  Afraid - awful might happen 0 0 0 0  Total GAD 7 Score 1 0 0 0  Anxiety Difficulty Not difficult at all  Not difficult at all Not difficult at all     Past Medical History:  Diagnosis Date  . Arthropathy, unspecified, site unspecified   . Esophageal reflux   . Osteoarthritis   . Osteoporosis, unspecified   . Other abnormal glucose   . Other and unspecified hyperlipidemia   . Symptomatic menopausal or female climacteric states   . Syncope and collapse    Past Surgical History:  Procedure Laterality Date  . ABDOMINAL HYSTERECTOMY    . APPENDECTOMY    . BREAST BIOPSY Left 1970's   neg  . COLONOSCOPY    . CYST REMOVAL TRUNK  11/24/2020   from back  . FINGER FRACTURE SURGERY     Social History   Socioeconomic History  . Marital status: Widowed    Spouse name: Not on file  . Number of children: Not on file  . Years of education: Not on file  . Highest education level: Some college, no degree  Occupational History  . Occupation: retired  Tobacco Use  . Smoking status: Former    Current packs/day: 0.00    Average packs/day: 1 pack/day for 26.0 years (26.0 ttl pk-yrs)    Types: Cigarettes    Start date: 06/07/1966    Quit date: 06/07/1992    Years since quitting: 31.6    Passive exposure: Past  . Smokeless tobacco: Former  Advertising account planner  . Vaping status: Never Used  Substance and Sexual Activity  . Alcohol use: Not Currently    Alcohol/week: 0.0 standard drinks of alcohol    Comment: occassional  . Drug use: No  . Sexual activity: Not on file  Other Topics Concern  . Not on file  Social History Narrative  . Not on file   Social Drivers of Health   Financial Resource Strain: Low Risk  (01/13/2024)   Overall Financial Resource Strain (CARDIA)   . Difficulty of Paying Living Expenses: Not hard at all  Food Insecurity: No Food Insecurity (01/13/2024)   Hunger Vital Sign   . Worried About Programme researcher, broadcasting/film/video in the Last Year: Never true   . Ran Out of  Food in the Last Year: Never true  Transportation Needs: No Transportation Needs (01/13/2024)   PRAPARE - Transportation   . Lack of Transportation (Medical): No   . Lack of Transportation (Non-Medical): No  Physical Activity: Inactive (09/17/2023)   Exercise Vital Sign   . Days of Exercise per Week: 0 days   . Minutes of Exercise per Session: 20 min  Stress: No Stress Concern Present (01/13/2024)   [employer] of Occupational Health - Occupational Stress Questionnaire   . Feeling of Stress :  Not at all  Social Connections: Moderately Integrated (01/13/2024)   Social Connection and Isolation Panel [NHANES]   . Frequency of Communication with Friends and Family: Twice a week   . Frequency of Social Gatherings with Friends and Family: Three times a week   . Attends Religious Services: More than 4 times per year   . Active Member of Clubs or Organizations: Yes   . Attends Banker Meetings: More than 4 times per year   . Marital Status: Widowed  Intimate Partner Violence: Not At Risk (09/17/2023)   Humiliation, Afraid, Rape, and Kick questionnaire   . Fear of Current or Ex-Partner: No   . Emotionally Abused: No   . Physically Abused: No   . Sexually Abused: No   Family History  Problem Relation Age of Onset  . Diabetes Mother   . Heart disease Mother   . Heart attack Mother   . Ulcers Father   . Gallstones Father   . Heart attack Brother   . Migraines Neg Hx   . Dementia Neg Hx    Current Outpatient Medications on File Prior to Visit  Medication Sig  . Acetaminophen (TYLENOL 8 HOUR ARTHRITIS PAIN PO) Take by mouth. 2 in am 1 in pm  . albuterol (VENTOLIN HFA) 108 (90 Base) MCG/ACT inhaler Inhale 2 puffs into the lungs every 4 (four) hours as needed for wheezing or shortness of breath (cough).  Marland Kitchen ascorbic acid (VITAMIN C) 500 MG tablet Take by mouth.  . beta carotene w/minerals (OCUVITE) tablet Take 1 tablet by mouth daily.  . carvedilol (COREG) 12.5 MG tablet Take  1 tablet (12.5 mg total) by mouth 2 (two) times daily with a meal.  . Cholecalciferol 10 MCG (400 UNIT) CAPS Take 1 tablet by mouth daily with breakfast.  . estradiol (ESTRACE) 0.1 MG/GM vaginal cream Estrogen Cream Instruction  Discard applicator  Apply pea sized amount to tip of finger to urethra before bed. Wash hands well after application. Use Monday, Wednesday and Friday  Estrogen Cream Instruction  Discard applicator  Apply pea sized amount to tip of finger to urethra before bed. Wash hands well after application. Use Monday, Wednesday and Friday  . fluticasone (FLONASE) 50 MCG/ACT nasal spray Place 2 sprays into both nostrils daily.  Marland Kitchen gabapentin (NEURONTIN) 300 MG capsule Take 3 capsules (900 mg total) by mouth at bedtime.  Boris Lown Oil 300 MG CAPS Take by mouth daily.  Marland Kitchen loratadine (CLARITIN) 10 MG tablet Take 10 mg by mouth daily.  . Mag Aspart-Potassium Aspart (POTASSIUM & MAGNESIUM ASPARTAT PO) Take 1 tablet by mouth daily.  . mirabegron ER (MYRBETRIQ) 50 MG TB24 tablet Take 1 tablet (50 mg total) by mouth daily.  . Multiple Vitamin (MULTIVITAMIN) tablet Take 1 tablet by mouth daily.  . ondansetron (ZOFRAN-ODT) 4 MG disintegrating tablet Take 1 tablet (4 mg total) by mouth every 8 (eight) hours as needed for nausea or vomiting.  . pantoprazole (PROTONIX) 40 MG tablet Take 1 tablet (40 mg total) by mouth daily before breakfast.  . REPATHA SURECLICK 140 MG/ML SOAJ Inject 140 mg into the skin every 14 (fourteen) days.  . clopidogrel (PLAVIX) 75 MG tablet Take 1 tablet (75 mg total) by mouth daily. (Patient not taking: Reported on 01/14/2024)   No current facility-administered medications on file prior to visit.    Review of Systems  Constitutional:  Negative for activity change, appetite change, chills, diaphoresis, fatigue and fever.  HENT:  Negative for congestion and hearing  loss.   Eyes:  Negative for visual disturbance.  Respiratory:  Negative for cough, chest tightness,  shortness of breath and wheezing.   Cardiovascular:  Negative for chest pain, palpitations and leg swelling.  Gastrointestinal:  Negative for abdominal pain, constipation, diarrhea, nausea and vomiting.  Genitourinary:  Negative for dysuria, frequency and hematuria.  Musculoskeletal:  Positive for neck pain. Negative for arthralgias.  Skin:  Negative for rash.  Neurological:  Negative for dizziness, weakness, light-headedness, numbness and headaches.  Hematological:  Negative for adenopathy.  Psychiatric/Behavioral:  Negative for behavioral problems, dysphoric mood and sleep disturbance.    Per HPI unless specifically indicated above     Objective:    BP 130/60 (BP Location: Right Arm, Patient Position: Sitting, Cuff Size: Large)   Pulse 66   Ht 5\' 5"  (1.651 m)   Wt 202 lb (91.6 kg)   SpO2 100%   BMI 33.61 kg/m   Wt Readings from Last 3 Encounters:  01/14/24 202 lb (91.6 kg)  12/08/23 213 lb (96.6 kg)  11/18/23 213 lb (96.6 kg)    Physical Exam Vitals and nursing note reviewed.  Constitutional:      General: She is not in acute distress.    Appearance: She is well-developed. She is obese. She is not diaphoretic.     Comments: Well-appearing, comfortable, cooperative  HENT:     Head: Normocephalic and atraumatic.  Eyes:     General:        Right eye: No discharge.        Left eye: No discharge.     Conjunctiva/sclera: Conjunctivae normal.     Pupils: Pupils are equal, round, and reactive to light.  Neck:     Thyroid: No thyromegaly.  Cardiovascular:     Rate and Rhythm: Normal rate and regular rhythm.     Pulses: Normal pulses.     Heart sounds: Normal heart sounds. No murmur heard. Pulmonary:     Effort: Pulmonary effort is normal. No respiratory distress.     Breath sounds: Normal breath sounds. No wheezing or rales.  Abdominal:     General: Bowel sounds are normal. There is no distension.     Palpations: Abdomen is soft. There is no mass.     Tenderness: There  is no abdominal tenderness.  Musculoskeletal:        General: No tenderness. Normal range of motion.     Cervical back: Normal range of motion and neck supple.     Right lower leg: No edema.     Left lower leg: No edema.     Comments: Upper / Lower Extremities: - Normal muscle tone, strength bilateral upper extremities 5/5, lower extremities 5/5  Lymphadenopathy:     Cervical: No cervical adenopathy.  Skin:    General: Skin is warm and dry.     Findings: No erythema or rash.  Neurological:     Mental Status: She is alert and oriented to person, place, and time.     Comments: Distal sensation intact to light touch all extremities  Psychiatric:        Mood and Affect: Mood normal.        Behavior: Behavior normal.        Thought Content: Thought content normal.     Comments: Well groomed, good eye contact, normal speech and thoughts    Results for orders placed or performed in visit on 01/06/24  TSH   Collection Time: 01/06/24  8:32 AM  Result Value Ref  Range   TSH 3.85 0.40 - 4.50 mIU/L  CBC with Differential/Platelet   Collection Time: 01/06/24  8:32 AM  Result Value Ref Range   WBC 4.5 3.8 - 10.8 Thousand/uL   RBC 4.39 3.80 - 5.10 Million/uL   Hemoglobin 13.1 11.7 - 15.5 g/dL   HCT 16.1 09.6 - 04.5 %   MCV 89.5 80.0 - 100.0 fL   MCH 29.8 27.0 - 33.0 pg   MCHC 33.3 32.0 - 36.0 g/dL   RDW 40.9 81.1 - 91.4 %   Platelets 211 140 - 400 Thousand/uL   MPV 11.5 7.5 - 12.5 fL   Neutro Abs 2,903 1,500 - 7,800 cells/uL   Absolute Lymphocytes 1,053 850 - 3,900 cells/uL   Absolute Monocytes 423 200 - 950 cells/uL   Eosinophils Absolute 90 15 - 500 cells/uL   Basophils Absolute 32 0 - 200 cells/uL   Neutrophils Relative % 64.5 %   Total Lymphocyte 23.4 %   Monocytes Relative 9.4 %   Eosinophils Relative 2.0 %   Basophils Relative 0.7 %  COMPLETE METABOLIC PANEL WITH GFR   Collection Time: 01/06/24  8:32 AM  Result Value Ref Range   Glucose, Bld 135 (H) 65 - 99 mg/dL   BUN 12  7 - 25 mg/dL   Creat 7.82 (L) 9.56 - 1.00 mg/dL   BUN/Creatinine Ratio 21 6 - 22 (calc)   Sodium 142 135 - 146 mmol/L   Potassium 4.3 3.5 - 5.3 mmol/L   Chloride 103 98 - 110 mmol/L   CO2 30 20 - 32 mmol/L   Calcium 9.5 8.6 - 10.4 mg/dL   Total Protein 6.6 6.1 - 8.1 g/dL   Albumin 4.3 3.6 - 5.1 g/dL   Globulin 2.3 1.9 - 3.7 g/dL (calc)   AG Ratio 1.9 1.0 - 2.5 (calc)   Total Bilirubin 0.9 0.2 - 1.2 mg/dL   Alkaline phosphatase (APISO) 77 37 - 153 U/L   AST 12 10 - 35 U/L   ALT 7 6 - 29 U/L  Lipid panel   Collection Time: 01/06/24  8:32 AM  Result Value Ref Range   Cholesterol 221 (H) <200 mg/dL   HDL 48 (L) > OR = 50 mg/dL   Triglycerides 213 (H) <150 mg/dL   LDL Cholesterol (Calc) 133 (H) mg/dL (calc)   Total CHOL/HDL Ratio 4.6 <5.0 (calc)   Non-HDL Cholesterol (Calc) 173 (H) <130 mg/dL (calc)  Hemoglobin Y8M   Collection Time: 01/06/24  8:32 AM  Result Value Ref Range   Hgb A1c MFr Bld 6.1 (H) <5.7 % of total Hgb   Mean Plasma Glucose 128 mg/dL   eAG (mmol/L) 7.1 mmol/L      Assessment & Plan:   Problem List Items Addressed This Visit     BPPV (benign paroxysmal positional vertigo), right   Essential hypertension   Relevant Medications   amLODipine (NORVASC) 10 MG tablet   hydrochlorothiazide (HYDRODIURIL) 25 MG tablet   irbesartan (AVAPRO) 300 MG tablet   History of cerebrovascular accident (CVA) with residual deficit   Hyperlipidemia associated with type 2 diabetes mellitus (HCC)   Relevant Medications   amLODipine (NORVASC) 10 MG tablet   hydrochlorothiazide (HYDRODIURIL) 25 MG tablet   irbesartan (AVAPRO) 300 MG tablet   Primary osteoarthritis involving multiple joints   Relevant Medications   traMADol (ULTRAM) 50 MG tablet   Spinal stenosis of lumbar region with neurogenic claudication   Relevant Medications   traMADol (ULTRAM) 50 MG tablet   Spondylosis of  lumbar region without myelopathy or radiculopathy   Relevant Medications   traMADol (ULTRAM) 50  MG tablet   Type 2 diabetes mellitus with other specified complication (HCC)   Relevant Medications   irbesartan (AVAPRO) 300 MG tablet   Other Visit Diagnoses       Annual physical exam    -  Primary     Chronic neck pain       Relevant Medications   traMADol (ULTRAM) 50 MG tablet        Updated Health Maintenance information Reviewed recent lab results with patient Encouraged improvement to lifestyle with diet and exercise Goal of weight loss  Cervicalgia Chronic neck pain with increased severity after naproxen discontinuation due to anticoagulant interaction. - Increase tramadol to 60 pills for 30 days. Max twice a day dosing - Continue gabapentin at night.  Benign Paroxysmal Positional Vertigo (BPPV) Persistent dizziness and imbalance. Significant discomfort from Epley maneuver. Follow-up with ENT scheduled. - Follow up with ENT for further evaluation and management. - Continue vestibular therapy as tolerated.  Type 2 Diabetes Mellitus Improved glycemic control with HbA1c reduced to 6.1% through lifestyle modifications. - Continue current dietary management and lifestyle modifications. Not on medication - Monitor HbA1c in 6 months.  Hypertension Blood pressure well-controlled with current medication regimen. - Refill antihypertensive medications. Amlodipine 10mg  daily, hydrochlorothiazide 25mg  daily, Carvedilol 12.5mg  BID  Hyperlipidemia LDL cholesterol improved to 133 mg/dL. On Repatha with compliance issues due to pen malfunctions. - Continue Repatha. 1 sample box today to assist, she is waiting on new shipment - Ensure timely refills of Repatha.  General Health Maintenance Up to date with most health maintenance activities. Pneumonia vaccination status needs confirmation. - Confirm pneumonia vaccination status. Updated Prevnar 20 in chart - Ensure regular eye exams and update records accordingly.  Follow-up Routine follow-up planned to monitor chronic  conditions. Weight loss likely due to dietary changes. - Schedule follow-up appointment in 6 months via MyChart. - Monitor weight and dietary habits. - Ensure medication refills are managed through pharmacy.        No orders of the defined types were placed in this encounter.   Meds ordered this encounter  Medications  . amLODipine (NORVASC) 10 MG tablet    Sig: Take 1 tablet (10 mg total) by mouth daily.    Dispense:  90 tablet    Refill:  3  . hydrochlorothiazide (HYDRODIURIL) 25 MG tablet    Sig: Take 1 tablet (25 mg total) by mouth daily.    Dispense:  90 tablet    Refill:  3  . irbesartan (AVAPRO) 300 MG tablet    Sig: Take 1 tablet (300 mg total) by mouth daily.    Dispense:  90 tablet    Refill:  3  . traMADol (ULTRAM) 50 MG tablet    Sig: Take 1 tablet (50 mg total) by mouth every 8 (eight) hours as needed. Max 2 pills in 24 hours.    Dispense:  60 tablet    Refill:  0     Follow up plan: Return for 6 month DM A1c.  Saralyn Pilar, DO [facility] 01/14/2024, 10:56 AM

## 2024-01-26 ENCOUNTER — Ambulatory Visit: Attending: Internal Medicine

## 2024-01-26 ENCOUNTER — Encounter

## 2024-02-02 ENCOUNTER — Ambulatory Visit

## 2024-02-02 ENCOUNTER — Encounter

## 2024-02-09 ENCOUNTER — Encounter

## 2024-02-16 ENCOUNTER — Encounter

## 2024-02-23 ENCOUNTER — Encounter

## 2024-02-25 ENCOUNTER — Other Ambulatory Visit: Payer: Self-pay | Admitting: Family Medicine

## 2024-02-25 DIAGNOSIS — M47816 Spondylosis without myelopathy or radiculopathy, lumbar region: Secondary | ICD-10-CM

## 2024-02-25 DIAGNOSIS — M150 Primary generalized (osteo)arthritis: Secondary | ICD-10-CM

## 2024-02-27 NOTE — Telephone Encounter (Signed)
 Requested medications are due for refill today.  yes  Requested medications are on the active medications list.  yes  Last refill. 01/14/2024 #60 0 rf  Future visit scheduled.   yes  Notes to clinic.  Refill not delegated.    Requested Prescriptions  Pending Prescriptions Disp Refills   traMADol  (ULTRAM ) 50 MG tablet [Pharmacy Med Name: TRAMADOL  HCL 50 MG TABLET] 60 tablet 0    Sig: Take 1 tablet (50 mg total) by mouth every 8 (eight) hours as needed. Max 2 pills in 24 hours.     Not Delegated - Analgesics:  Opioid Agonists Failed - 02/27/2024  8:15 AM      Failed - This refill cannot be delegated      Failed - Urine Drug Screen completed in last 360 days      Passed - Valid encounter within last 3 months    Recent Outpatient Visits           1 month ago Annual physical exam   [facility] [place], Kayleen Party, DO   2 months ago Spondylosis of lumbar region without myelopathy or radiculopathy    [facility], Kayleen Party, DO       Future Appointments             In 9 months [place], Nyra Bellis [facility] [place]

## 2024-03-08 ENCOUNTER — Encounter: Payer: Self-pay | Admitting: Neurology

## 2024-03-11 ENCOUNTER — Institutional Professional Consult (permissible substitution): Admitting: Neurology

## 2024-03-23 ENCOUNTER — Encounter: Payer: Self-pay | Admitting: Family Medicine

## 2024-03-26 ENCOUNTER — Ambulatory Visit: Payer: Medicare HMO

## 2024-03-26 DIAGNOSIS — Z0000 Encounter for general adult medical examination without abnormal findings: Secondary | ICD-10-CM | POA: Diagnosis not present

## 2024-03-26 NOTE — Progress Notes (Signed)
 Subjective:   Yvette [name] is a 78 y.o. who presents for a Medicare Wellness preventive visit.  As a reminder, Annual Wellness Visits don't include a physical exam, and some assessments may be limited, especially if this visit is performed virtually. We may recommend an in-person follow-up visit with your provider if needed.  Visit Complete: Virtual I connected with  Yvette [name] on 03/26/24 by a audio enabled telemedicine application and verified that I am speaking with the correct person using two identifiers.  Patient Location: Home  Provider Location: Home Office  I discussed the limitations of evaluation and management by telemedicine. The patient expressed understanding and agreed to proceed.  Vital Signs: Because this visit was a virtual/telehealth visit, some criteria may be missing or patient reported. Any vitals not documented were not able to be obtained and vitals that have been documented are patient reported.  VideoDeclined- This patient declined Librarian, academic. Therefore the visit was completed with audio only.  Persons Participating in Visit: Patient.  AWV Questionnaire: No: Patient Medicare AWV questionnaire was not completed prior to this visit.  Cardiac Risk Factors include: advanced age (>56men, >74 women);diabetes mellitus;dyslipidemia;hypertension;sedentary lifestyle;obesity (BMI >30kg/m2)     Objective:    Today's Vitals   03/26/24 1133  PainSc: 2    There is no height or weight on file to calculate BMI.     03/26/2024   11:45 AM 09/18/2023    6:46 PM 01/17/2023    8:23 AM 12/28/2021    2:11 PM 12/26/2020    9:08 AM 11/30/2019    3:19 PM 06/06/2015    1:12 PM  Advanced Directives  Does Patient Have a Medical Advance Directive? No No No No Yes Yes Yes   Type of Agricultural consultant;Living will Living will;Healthcare Power of Attorney Living will   Does patient want to make changes to  medical advance directive?       No - Patient declined   Copy of Healthcare Power of Attorney in Chart?     No - copy requested No - copy requested No - copy requested   Would patient like information on creating a medical advance directive? No - Patient declined  No - Patient declined No - Patient declined        Data saved with a previous flowsheet row definition    Current Medications (verified) Outpatient Encounter Medications as of 03/26/2024  Medication Sig   Acetaminophen  (TYLENOL  8 HOUR ARTHRITIS PAIN PO) Take by mouth. 2 in am 1 in pm   albuterol  (VENTOLIN  HFA) 108 (90 Base) MCG/ACT inhaler Inhale 2 puffs into the lungs every 4 (four) hours as needed for wheezing or shortness of breath (cough).   amLODipine  (NORVASC ) 10 MG tablet Take 1 tablet (10 mg total) by mouth daily.   ascorbic acid (VITAMIN C) 500 MG tablet Take by mouth.   beta carotene w/minerals (OCUVITE) tablet Take 1 tablet by mouth daily.   carvedilol  (COREG ) 12.5 MG tablet Take 1 tablet (12.5 mg total) by mouth 2 (two) times daily with a meal.   Cholecalciferol 10 MCG (400 UNIT) CAPS Take 1 tablet by mouth daily with breakfast.   clopidogrel  (PLAVIX ) 75 MG tablet Take 1 tablet (75 mg total) by mouth daily.   estradiol  (ESTRACE ) 0.1 MG/GM vaginal cream Estrogen Cream Instruction  Discard applicator  Apply pea sized amount to tip of finger to urethra before bed. Wash hands well after application.  Use Monday, Wednesday and Friday  Estrogen Cream Instruction  Discard applicator  Apply pea sized amount to tip of finger to urethra before bed. Wash hands well after application. Use Monday, Wednesday and Friday   fluticasone  (FLONASE ) 50 MCG/ACT nasal spray Place 2 sprays into both nostrils daily.   hydrochlorothiazide  (HYDRODIURIL ) 25 MG tablet Take 1 tablet (25 mg total) by mouth daily.   irbesartan  (AVAPRO ) 300 MG tablet Take 1 tablet (300 mg total) by mouth daily.   Krill Oil 300 MG CAPS Take by mouth daily.    loratadine (CLARITIN) 10 MG tablet Take 10 mg by mouth daily.   Mag Aspart-Potassium Aspart (POTASSIUM & MAGNESIUM ASPARTAT PO) Take 1 tablet by mouth daily.   mirabegron  ER (MYRBETRIQ ) 50 MG TB24 tablet Take 1 tablet (50 mg total) by mouth daily. (Patient taking differently: Take 50 mg by mouth daily. TAKES PRN)   Multiple Vitamin (MULTIVITAMIN) tablet Take 1 tablet by mouth daily.   pantoprazole  (PROTONIX ) 40 MG tablet Take 1 tablet (40 mg total) by mouth daily before breakfast.   REPATHA  SURECLICK 140 MG/ML SOAJ Inject 140 mg into the skin every 14 (fourteen) days.   traMADol  (ULTRAM ) 50 MG tablet Take 1 tablet (50 mg total) by mouth every 8 (eight) hours as needed. Max 2 pills in 24 hours.   gabapentin  (NEURONTIN ) 300 MG capsule Take 3 capsules (900 mg total) by mouth at bedtime. (Patient not taking: Reported on 03/26/2024)   ondansetron  (ZOFRAN -ODT) 4 MG disintegrating tablet Take 1 tablet (4 mg total) by mouth every 8 (eight) hours as needed for nausea or vomiting.   No facility-administered encounter medications on file as of 03/26/2024.    Allergies (verified) Simvastatin  and Erythromycin   History: Past Medical History:  Diagnosis Date   Arthropathy, unspecified, site unspecified    Esophageal reflux    Osteoarthritis    Osteoporosis, unspecified    Other abnormal glucose    Other and unspecified hyperlipidemia    Symptomatic menopausal or female climacteric states    Syncope and collapse    Past Surgical History:  Procedure Laterality Date   ABDOMINAL HYSTERECTOMY     APPENDECTOMY     BREAST BIOPSY Left 1970's   neg   COLONOSCOPY     CYST REMOVAL TRUNK  11/24/2020   from back   FINGER FRACTURE SURGERY     Family History  Problem Relation Age of Onset   Diabetes Mother    Heart disease Mother    Heart attack Mother    Ulcers Father    Gallstones Father    Heart attack Brother    Migraines Neg Hx    Dementia Neg Hx    Social History   Socioeconomic History    Marital status: Widowed    Spouse name: Not on file   Number of children: Not on file   Years of education: Not on file   Highest education level: Some college, no degree  Occupational History   Occupation: retired  Tobacco Use   Smoking status: Former    Current packs/day: 0.00    Average packs/day: 1 pack/day for 26.0 years (26.0 ttl pk-yrs)    Types: Cigarettes    Start date: 06/07/1966    Quit date: 06/07/1992    Years since quitting: 31.8    Passive exposure: Past   Smokeless tobacco: Former  Building services engineer status: Never Used  Substance and Sexual Activity   Alcohol use: Not Currently    Alcohol/week: 0.0  standard drinks of alcohol    Comment: occassional   Drug use: No   Sexual activity: Not on file  Other Topics Concern   Not on file  Social History Narrative   Not on file   Social Drivers of Health   Financial Resource Strain: Low Risk  (03/26/2024)   Overall Financial Resource Strain (CARDIA)    Difficulty of Paying Living Expenses: Not hard at all  Food Insecurity: No Food Insecurity (03/26/2024)   Hunger Vital Sign    Worried About Running Out of Food in the Last Year: Never true    Ran Out of Food in the Last Year: Never true  Transportation Needs: No Transportation Needs (03/26/2024)   PRAPARE - Administrator, Civil Service (Medical): No    Lack of Transportation (Non-Medical): No  Physical Activity: Insufficiently Active (03/26/2024)   Exercise Vital Sign    Days of Exercise per Week: 2 days    Minutes of Exercise per Session: 20 min  Stress: No Stress Concern Present (03/26/2024)   [employer] of Occupational Health - Occupational Stress Questionnaire    Feeling of Stress: Not at all  Social Connections: Socially Isolated (03/26/2024)   Social Connection and Isolation Panel    Frequency of Communication with Friends and Family: More than three times a week    Frequency of Social Gatherings with Friends and Family: Once a week     Attends Religious Services: Never    Database administrator or Organizations: No    Attends Banker Meetings: Never    Marital Status: Widowed    Tobacco Counseling Counseling given: Not Answered    Clinical Intake:  Pre-visit preparation completed: Yes  Pain : 0-10 Pain Score: 2  Pain Type: Chronic pain Pain Location: Hip Pain Orientation: Right, Left Pain Descriptors / Indicators: Aching, Discomfort Pain Onset: More than a month ago Pain Frequency: Intermittent     BMI - recorded: 33.6 Nutritional Status: BMI > 30  Obese Nutritional Risks: None Diabetes: Yes CBG done?: No Did pt. bring in CBG monitor from home?: No  Lab Results  Component Value Date   HGBA1C 6.1 (H) 01/06/2024   HGBA1C 6.4 (H) 07/31/2023   HGBA1C 5.9 (A) 07/08/2023     How often do you need to have someone help you when you read instructions, pamphlets, or other written materials from your doctor or pharmacy?: 1 - Never  Interpreter Needed?: No  Information entered by :: Yvette Fergusson, Yvette [name]   Activities of Daily Living     03/26/2024   11:47 AM  In your present state of health, do you have any difficulty performing the following activities:  Hearing? 0  Vision? 0  Difficulty concentrating or making decisions? 0  Walking or climbing stairs? 0  Dressing or bathing? 0  Doing errands, shopping? 0  Preparing Food and eating ? N  Using the Toilet? N  In the past six months, have you accidently leaked urine? N  Do you have problems with loss of bowel control? N  Managing your Medications? N  Managing your Finances? N  Housekeeping or managing your Housekeeping? N    Patient Care Team: Raina Bunting, DO as PCP - General (Family Medicine) Minor, Carma Chime, RN (Inactive) as [employer]  I have updated your Care Teams any recent Medical Services you may have received from other providers in the past year.      Assessment:  This is a routine wellness examination for Yvette [name].  Hearing/Vision screen Hearing Screening - Comments:: NO AIDS Vision Screening - Comments:: [facility]- HAD EYE EXAM IN MARCH   Goals Addressed             This Visit's Progress    Cut out extra servings         Depression Screen     03/26/2024   11:42 AM 01/14/2024   10:47 AM 09/17/2023   10:52 AM 01/17/2023    8:22 AM 01/01/2023   11:10 AM 07/03/2022   11:13 AM 02/14/2022   10:38 AM  PHQ 2/9 Scores  PHQ - 2 Score 0 0 0 0 0 0 0  PHQ- 9 Score 0 1  0 2  2    Fall Risk     03/26/2024   11:45 AM 01/14/2024   10:46 AM 09/17/2023   10:52 AM 01/17/2023    8:24 AM 01/01/2023   11:10 AM  Fall Risk   Falls in the past year? 1 1 1  0 0  Number falls in past yr: 0 0 0 0 0  Injury with Fall? 0 0 0 0 0  Risk for fall due to : Other (Comment)   No Fall Risks No Fall Risks  Risk for fall due to: Comment FELL WHEN HAD MINI STROKE      Follow up Falls evaluation completed;Falls prevention discussed   Falls prevention discussed;Falls evaluation completed Falls evaluation completed    MEDICARE RISK AT HOME:  Medicare Risk at Home Any stairs in or around the home?: Yes If so, are there any without handrails?: No Home free of loose throw rugs in walkways, pet beds, electrical cords, etc?: Yes Adequate lighting in your home to reduce risk of falls?: Yes Life alert?: No Use of a cane, walker or w/c?: Yes (CANE WHEN OUTSIDE OR AWAY FROM HOME) Grab bars in the bathroom?: Yes Shower chair or bench in shower?: No Elevated toilet seat or a handicapped toilet?: Yes  TIMED UP AND GO:  Was the test performed?  No  Cognitive Function: 6CIT completed        03/26/2024   11:50 AM 01/17/2023    8:31 AM 12/26/2020    9:11 AM  6CIT Screen  What Year? 0 points 0 points 0 points  What month? 0 points 0 points 0 points  What time? 0 points 0 points 0 points  Count back from 20 0 points 0 points 0 points  Months in  reverse 0 points 0 points 0 points  Repeat phrase 0 points 0 points 2 points  Total Score 0 points 0 points 2 points    Immunizations Immunization History  Administered Date(s) Administered   Fluad Quad(high Dose 65+) 08/30/2019   Influenza Inj Mdck Quad Pf 08/30/2019   Influenza Split 07/07/2013   Influenza, High Dose Seasonal PF 07/08/2017, 07/13/2018   Influenza,inj,Quad PF,6+ Mos 06/07/2015, 08/15/2016   Influenza-Unspecified 12/02/2020, 09/06/2021, 07/29/2022, 06/26/2023   PNEUMOCOCCAL CONJUGATE-20 06/07/2021   Pneumococcal Conjugate-13 12/21/2014   Pneumococcal Polysaccharide-23 07/08/2017   Zoster Recombinant(Shingrix) 02/13/2018, 04/16/2018    Screening Tests Health Maintenance  Topic Date Due   OPHTHALMOLOGY EXAM  01/23/2023   INFLUENZA VACCINE  05/07/2024   Diabetic kidney evaluation - Urine ACR  07/07/2024   FOOT EXAM  07/07/2024   HEMOGLOBIN A1C  07/07/2024   Diabetic kidney evaluation - eGFR measurement  01/05/2025   Medicare Annual Wellness (AWV)  03/26/2025   Pneumococcal Vaccine: 50+ Years  Completed   DEXA SCAN  Completed   Zoster Vaccines- Shingrix  Completed   HPV VACCINES  Aged Out   Meningococcal B Vaccine  Aged Out   DTaP/Tdap/Td  Discontinued   COVID-19 Vaccine  Discontinued   Hepatitis C Screening  Discontinued   Fecal DNA (Cologuard)  Discontinued    Health Maintenance  Health Maintenance Due  Topic Date Due   OPHTHALMOLOGY EXAM  01/23/2023   Health Maintenance Items Addressed: NEEDS TDAP, DECLINES COVIDS; UP TO DATE PNA & SHINGRIX; HAD EYE EXAM IN MARCH; AGED OUT OF COLONOSCOPY & MAMMOGRAM  Additional Screening:  Vision Screening: Recommended annual ophthalmology exams for early detection of glaucoma and other disorders of the eye. Would you like a referral to an eye doctor? No    Dental Screening: Recommended annual dental exams for proper oral hygiene  Community Resource Referral / Chronic Care Management: CRR required this visit?   No   CCM required this visit?  No   Plan:    I have personally reviewed and noted the following in the patient's chart:   Medical and social history Use of alcohol, tobacco or illicit drugs  Current medications and supplements including opioid prescriptions. Patient is not currently taking opioid prescriptions. Functional ability and status Nutritional status Physical activity Advanced directives List of other physicians Hospitalizations, surgeries, and ER visits in previous 12 months Vitals Screenings to include cognitive, depression, and falls Referrals and appointments  In addition, I have reviewed and discussed with patient certain preventive protocols, quality metrics, and best practice recommendations. A written personalized care plan for preventive services as well as general preventive health recommendations were provided to patient.   Yvette Bright, Yvette [name]   01/13/8118   After Visit Summary: (MyChart) Due to this being a telephonic visit, the after visit summary with patients personalized plan was offered to patient via MyChart   Notes: Nothing significant to report at this time.

## 2024-03-26 NOTE — Patient Instructions (Addendum)
 Yvette [name] , Thank you for taking time out of your busy schedule to complete your Annual Wellness Visit with me. I enjoyed our conversation and look forward to speaking with you again next year. I, as well as your care team,  appreciate your ongoing commitment to your health goals. Please review the following plan we discussed and let me know if I can assist you in the future.    Follow up Visits: Next Medicare AWV with our clinical staff:   04/01/25 @ 10:50 AM BY PHONE Have you seen your provider in the last 6 months (3 months if uncontrolled diabetes)? Yes  Clinician Recommendations:  Aim for 30 minutes of exercise or brisk walking, 6-8 glasses of water, and 5 servings of fruits and vegetables each day. TAKE CARE!      This is a list of the screening recommended for you and due dates:  Health Maintenance  Topic Date Due   Eye exam for diabetics  01/23/2023   Flu Shot  05/07/2024   Yearly kidney health urinalysis for diabetes  07/07/2024   Complete foot exam   07/07/2024   Hemoglobin A1C  07/07/2024   Yearly kidney function blood test for diabetes  01/05/2025   Medicare Annual Wellness Visit  03/26/2025   Pneumococcal Vaccine for age over 31  Completed   DEXA scan (bone density measurement)  Completed   Zoster (Shingles) Vaccine  Completed   HPV Vaccine  Aged Out   Meningitis B Vaccine  Aged Out   DTaP/Tdap/Td vaccine  Discontinued   COVID-19 Vaccine  Discontinued   Hepatitis C Screening  Discontinued   Cologuard (Stool DNA test)  Discontinued    Advanced directives: (ACP Link)Information on Advanced Care Planning can be found at [place]  Secretary of [facility] Advance Health Care Directives Advance Health Care Directives. [email or web address]  Advance Care Planning is important because it:  [x]  Makes sure you receive the medical care that is consistent with your values, goals, and preferences  [x]  It provides guidance to your family and loved ones and reduces their decisional burden  about whether or not they are making the right decisions based on your wishes.  Follow the link provided in your after visit summary or read over the paperwork we have mailed to you to help you started getting your Advance Directives in place. If you need assistance in completing these, please reach out to us  so that we can help you!

## 2024-04-08 ENCOUNTER — Other Ambulatory Visit: Payer: Self-pay | Admitting: Internal Medicine

## 2024-04-08 DIAGNOSIS — M150 Primary generalized (osteo)arthritis: Secondary | ICD-10-CM

## 2024-04-08 DIAGNOSIS — M47816 Spondylosis without myelopathy or radiculopathy, lumbar region: Secondary | ICD-10-CM

## 2024-04-10 DIAGNOSIS — H269 Unspecified cataract: Secondary | ICD-10-CM | POA: Diagnosis not present

## 2024-04-10 DIAGNOSIS — F411 Generalized anxiety disorder: Secondary | ICD-10-CM | POA: Diagnosis not present

## 2024-04-10 DIAGNOSIS — K219 Gastro-esophageal reflux disease without esophagitis: Secondary | ICD-10-CM | POA: Diagnosis not present

## 2024-04-10 DIAGNOSIS — E785 Hyperlipidemia, unspecified: Secondary | ICD-10-CM | POA: Diagnosis not present

## 2024-04-10 DIAGNOSIS — F325 Major depressive disorder, single episode, in full remission: Secondary | ICD-10-CM | POA: Diagnosis not present

## 2024-04-10 DIAGNOSIS — Z7902 Long term (current) use of antithrombotics/antiplatelets: Secondary | ICD-10-CM | POA: Diagnosis not present

## 2024-04-10 DIAGNOSIS — N952 Postmenopausal atrophic vaginitis: Secondary | ICD-10-CM | POA: Diagnosis not present

## 2024-04-10 DIAGNOSIS — M1990 Unspecified osteoarthritis, unspecified site: Secondary | ICD-10-CM | POA: Diagnosis not present

## 2024-04-10 DIAGNOSIS — M4800 Spinal stenosis, site unspecified: Secondary | ICD-10-CM | POA: Diagnosis not present

## 2024-04-10 DIAGNOSIS — I69344 Monoplegia of lower limb following cerebral infarction affecting left non-dominant side: Secondary | ICD-10-CM | POA: Diagnosis not present

## 2024-04-10 DIAGNOSIS — E669 Obesity, unspecified: Secondary | ICD-10-CM | POA: Diagnosis not present

## 2024-04-10 DIAGNOSIS — Z881 Allergy status to other antibiotic agents status: Secondary | ICD-10-CM | POA: Diagnosis not present

## 2024-04-10 DIAGNOSIS — M5430 Sciatica, unspecified side: Secondary | ICD-10-CM | POA: Diagnosis not present

## 2024-04-10 DIAGNOSIS — R32 Unspecified urinary incontinence: Secondary | ICD-10-CM | POA: Diagnosis not present

## 2024-04-10 DIAGNOSIS — Z888 Allergy status to other drugs, medicaments and biological substances status: Secondary | ICD-10-CM | POA: Diagnosis not present

## 2024-04-10 DIAGNOSIS — K5900 Constipation, unspecified: Secondary | ICD-10-CM | POA: Diagnosis not present

## 2024-04-10 DIAGNOSIS — Z6834 Body mass index (BMI) 34.0-34.9, adult: Secondary | ICD-10-CM | POA: Diagnosis not present

## 2024-04-10 DIAGNOSIS — I10 Essential (primary) hypertension: Secondary | ICD-10-CM | POA: Diagnosis not present

## 2024-04-12 NOTE — Telephone Encounter (Signed)
 Requested medication (s) are due for refill today: yes   Requested medication (s) are on the active medication list: yes  Last refill:  02/27/24 #60  Future visit scheduled: yes   Notes to clinic:  Please review for refill. Refill not delegated per protocol.     Requested Prescriptions  Pending Prescriptions Disp Refills   traMADol  (ULTRAM ) 50 MG tablet [Pharmacy Med Name: TRAMADOL  HCL 50 MG TABLET] 60 tablet 0    Sig: Take 1 tablet (50 mg total) by mouth every 8 (eight) hours as needed. Max 2 pills in 24 hours.     Not Delegated - Analgesics:  Opioid Agonists Failed - 04/12/2024  3:48 PM      Failed - This refill cannot be delegated      Failed - Urine Drug Screen completed in last 360 days      Passed - Valid encounter within last 3 months    Recent Outpatient Visits           2 months ago Annual physical exam   [facility] [place], Marsa PARAS, DO   4 months ago Spondylosis of lumbar region without myelopathy or radiculopathy   [place] [place], Marsa PARAS, DO       Future Appointments             In 8 months [place], Clotilda DELENA RIGGERS [facility] [place]

## 2024-07-14 NOTE — Progress Notes (Signed)
 Yvette [name]                                          MRN: [id number]   07/14/2024   The [facility]I Quality Team Specialist reviewed this patient medical record for the purposes of chart review for care gap closure. The following were reviewed: chart review for care gap closure-kidney health evaluation for diabetes:eGFR  and uACR.    VBCI Quality Team

## 2024-07-15 NOTE — Progress Notes (Signed)
 Yvette [name]                                          MRN: [id number]   07/15/2024   The VBCI Quality Team Specialist reviewed this patient medical record for the purposes of chart review for care gap closure. The following were reviewed: abstraction for care gap closure-controlling blood pressure.    VBCI Quality Team

## 2024-07-19 ENCOUNTER — Ambulatory Visit: Admitting: Family Medicine

## 2024-07-19 ENCOUNTER — Encounter: Payer: Self-pay | Admitting: Family Medicine

## 2024-07-19 ENCOUNTER — Other Ambulatory Visit: Payer: Self-pay | Admitting: Family Medicine

## 2024-07-19 VITALS — BP 138/78 | HR 69 | Ht 65.0 in | Wt 196.1 lb

## 2024-07-19 DIAGNOSIS — I10 Essential (primary) hypertension: Secondary | ICD-10-CM

## 2024-07-19 DIAGNOSIS — I6930 Unspecified sequelae of cerebral infarction: Secondary | ICD-10-CM

## 2024-07-19 DIAGNOSIS — M48062 Spinal stenosis, lumbar region with neurogenic claudication: Secondary | ICD-10-CM

## 2024-07-19 DIAGNOSIS — E1169 Type 2 diabetes mellitus with other specified complication: Secondary | ICD-10-CM

## 2024-07-19 DIAGNOSIS — M47816 Spondylosis without myelopathy or radiculopathy, lumbar region: Secondary | ICD-10-CM

## 2024-07-19 DIAGNOSIS — Z0000 Encounter for general adult medical examination without abnormal findings: Secondary | ICD-10-CM

## 2024-07-19 DIAGNOSIS — M150 Primary generalized (osteo)arthritis: Secondary | ICD-10-CM | POA: Diagnosis not present

## 2024-07-19 DIAGNOSIS — H8111 Benign paroxysmal vertigo, right ear: Secondary | ICD-10-CM

## 2024-07-19 DIAGNOSIS — E785 Hyperlipidemia, unspecified: Secondary | ICD-10-CM | POA: Diagnosis not present

## 2024-07-19 LAB — POCT GLYCOSYLATED HEMOGLOBIN (HGB A1C): Hemoglobin A1C: 5.4 % (ref 4.0–5.6)

## 2024-07-19 MED ORDER — MECLIZINE HCL 25 MG PO TABS: 25.0000 mg | ORAL_TABLET | Freq: Three times a day (TID) | ORAL | 0 refills | Status: AC | PRN

## 2024-07-19 NOTE — Progress Notes (Signed)
 Subjective:    Patient ID: Yvette [name], female    DOB: [birth year], 78 y.o.   MRN: [id number]  Yvette [name] is a 78 y.o. female presenting on 07/19/2024 for Medical Management of Chronic Issues   HPI  Discussed the use of AI scribe software for clinical note transcription with the patient, who gave verbal consent to proceed.  History of Present Illness   Yvette [name] is an 77 year old female who presents for a follow-up visit for vertigo and chronic pain.  Vertigo and lightheadedness - Ongoing vertigo, often triggered by standing or quick movements - Episodes are sporadic and sometimes occur without a clear trigger - Lightheadedness and woozy sensation accompany vertigo - Symptoms are exacerbated by not eating much before the visit - No recent use of meclizine; last used approximately eleven years ago  Chronic pain and musculoskeletal symptoms - Chronic pain primarily in the left hip, associated with an old fracture - Opted against surgery for the hip fracture after orthopedic consultation, as it was deemed an old injury not requiring intervention - Takes tramadol , one pill at night, for pain management; has not increased the dose despite daily pain - Back pain present; neurology consultation advised against surgery and recommended medication management  Gait instability and mobility limitations - 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 - Previously attended physical therapy, which improved confidence in walking, but discontinued due to vertigo - Uses a cane for stability when outside the home  Peripheral neuropathy symptoms - Numbness in both feet - Feet sometimes feel cold to the touch despite not being cold - Monitors feet for sores or calluses with assistance from her son-in-law  Anxiety symptoms - New onset anxiety since turning [age], attributed to decreased ability to accomplish tasks - Initiated a supplement for anxiety management - Anxiety worsens when  overwhelmed by tasks - Manages anxiety by pacing herself and acknowledging her limitations    '  Type 2 Diabetes / Obesity BMI >32 Improvement overall.  Remains Off Metformin  Last A1c 5.4 (down from 6.4) Currently on ACEi, ASA 81, statin Lifestyle: - Diet goal to improve diet still. She does admit some candy increased at times. - Exercise: Improved activity with some wt loss Admits occasional tingling in feet. Denies hypoglycemia, visual changes, numbness .  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 Last lipid LDL down to 133 (prior 169-170) Has failed Statin therapy, Zetia , Rosuvastatin  She has famlilal cholesterol history and hyperTG On Repatha  q 2 weeks inj, had missed doses due to pen malfunction   Lumbar Radiculopathy Osteoarthritis hips Followed by Ortho and Neurology She had nerve testing by Dr Onita, no surgery needed. Dx DDD spine  Unlikely to have spine surgery in future She is improving her ambulation and her nerve strength and function On Gabapentin  per Neuro Has upcoming apt Neuro   R hip older fracture identified, consider surgery vs non surgical    Health Maintenance:   Prevnar-20 vaccine 2022  Flu Shot tomorrow at pharmacy 10/14     07/19/2024   11:01 AM 03/26/2024   11:42 AM 01/14/2024   10:47 AM  Depression screen PHQ 2/9  Decreased Interest 0 0 0  Down, Depressed, Hopeless 0 0 0  PHQ - 2 Score 0 0 0  Altered sleeping 1 0 0  Tired, decreased energy 1 0 1  Change in appetite 1 0 0  Feeling bad or failure about yourself  0 0 0  Trouble concentrating 0 0 0  Moving slowly or fidgety/restless 0 0 0   Suicidal thoughts 0 0 0  PHQ-9 Score 3 0 1  Difficult doing work/chores Not difficult at all Not difficult at all Not difficult at all       07/19/2024   11:01 AM 01/14/2024   10:47 AM 09/17/2023   10:52 AM 01/01/2023   11:10 AM  GAD 7 : Generalized Anxiety Score  Nervous, Anxious, on Edge 1 1 0 0  Control/stop worrying 0 0 0 0  Worry too much - different things 0 0 0 0  Trouble relaxing 0 0 0 0  Restless 0 0 0 0  Easily annoyed or irritable 0 0 0 0  Afraid - awful might happen 0 0 0 0  Total GAD 7 Score 1 1 0 0  Anxiety Difficulty Not difficult at all Not difficult at all  Not difficult at all    Social History   Tobacco Use   Smoking status: Former    Current packs/day: 0.00    Average packs/day: 1 pack/day for 26.0 years (26.0 ttl pk-yrs)    Types: Cigarettes    Start date: 06/07/1966    Quit date: 06/07/1992    Years since quitting: 32.1    Passive exposure: Past   Smokeless tobacco: Former  Building services engineer status: Never Used  Substance Use Topics   Alcohol use: Not Currently    Alcohol/week: 0.0 standard drinks of alcohol    Comment: occassional   Drug use: No    Review of Systems  Constitutional:  Negative for activity change, appetite change, chills, diaphoresis, fatigue and fever.  HENT:  Negative for congestion and hearing loss.   Eyes:  Negative for visual disturbance.  Respiratory:  Negative for cough, chest tightness, shortness of breath and wheezing.   Cardiovascular:  Negative for chest pain, palpitations and leg swelling.  Gastrointestinal:  Negative for abdominal pain, constipation, diarrhea, nausea and vomiting.  Genitourinary:  Negative for dysuria, frequency and hematuria.  Musculoskeletal:  Negative for arthralgias and neck pain.  Skin:  Negative for rash.  Neurological:  Negative for dizziness, weakness, light-headedness, numbness and headaches.  Hematological:  Negative for adenopathy.  Psychiatric/Behavioral:  Negative for behavioral  problems, dysphoric mood and sleep disturbance.    Per HPI unless specifically indicated above     Objective:    BP 138/78 (BP Location: Left Arm, Cuff Size: Normal)   Pulse 69   Ht 5' 5 (1.651 m)   Wt 196 lb 2 oz (89 kg)   SpO2 97%   BMI 32.64 kg/m   Wt Readings from Last 3 Encounters:  07/19/24 196 lb 2 oz (89 kg)  01/14/24 202 lb (91.6 kg)  12/08/23 213 lb (96.6 kg)    Physical Exam Vitals and nursing note reviewed.  Constitutional:      General: She is not in acute distress.    Appearance: She is well-developed. She is not diaphoretic.     Comments: Well-appearing, comfortable, cooperative  HENT:     Head: Normocephalic and atraumatic.  Eyes:     General:        Right eye: No discharge.  Left eye: No discharge.     Conjunctiva/sclera: Conjunctivae normal.     Pupils: Pupils are equal, round, and reactive to light.  Neck:     Thyroid: No thyromegaly.  Cardiovascular:     Rate and Rhythm: Normal rate and regular rhythm.     Pulses: Normal pulses.     Heart sounds: Normal heart sounds. No murmur heard. Pulmonary:     Effort: Pulmonary effort is normal. No respiratory distress.     Breath sounds: Normal breath sounds. No wheezing or rales.  Abdominal:     General: Bowel sounds are normal. There is no distension.     Palpations: Abdomen is soft. There is no mass.     Tenderness: There is no abdominal tenderness.  Musculoskeletal:        General: No tenderness. Normal range of motion.     Cervical back: Normal range of motion and neck supple.     Right lower leg: No edema.     Left lower leg: No edema.     Comments: Upper / Lower Extremities: - Normal muscle tone, strength bilateral upper extremities 5/5, lower extremities 5/5  Lymphadenopathy:     Cervical: No cervical adenopathy.  Skin:    General: Skin is warm and dry.     Findings: No erythema or rash.  Neurological:     Mental Status: She is alert and oriented to person, place, and time.      Comments: Distal sensation intact to light touch all extremities  Psychiatric:        Mood and Affect: Mood normal.        Behavior: Behavior normal.        Thought Content: Thought content normal.     Comments: Well groomed, good eye contact, normal speech and thoughts     Diabetic Foot Exam - Simple   Simple Foot Form Diabetic Foot exam was performed with the following findings: Yes 07/19/2024 11:17 AM  Visual Inspection No deformities, no ulcerations, no other skin breakdown bilaterally: Yes Sensation Testing Intact to touch and monofilament testing bilaterally: Yes Pulse Check Posterior Tibialis and Dorsalis pulse intact bilaterally: Yes Comments    Recent Labs    07/31/23 1038 01/06/24 0832 07/19/24 1108  HGBA1C 6.4* 6.1* 5.4    Results for orders placed or performed in visit on 07/19/24  POCT HgB A1C   Collection Time: 07/19/24 11:08 AM  Result Value Ref Range   Hemoglobin A1C 5.4 4.0 - 5.6 %   HbA1c POC (<> result, manual entry)     HbA1c, POC (prediabetic range)     HbA1c, POC (controlled diabetic range)        Assessment & Plan:   Problem List Items Addressed This Visit     BPPV (benign paroxysmal positional vertigo), right   Relevant Medications   meclizine (ANTIVERT) 25 MG tablet   Essential hypertension   Relevant Orders   AMB Referral [facility] Care Management   History of cerebrovascular accident (CVA) with residual deficit   Hyperlipidemia associated with type 2 diabetes mellitus (HCC)   Relevant Orders   AMB Referral [facility]I Care Management   Primary osteoarthritis involving multiple joints   Spinal stenosis of lumbar region with neurogenic claudication   Spondylosis of lumbar region without myelopathy or radiculopathy   Type 2 diabetes mellitus with other specified complication (HCC)   Relevant Orders   POCT HgB A1C (Completed)   AMB Referral [facility]BCI Care Management   Other Visit Diagnoses  Annual physical exam    -  Primary       Updated  Health Maintenance information Reviewed recent lab results with patient Encouraged improvement to lifestyle with diet and exercise Goal of weight loss   Type 2 diabetes mellitus with diabetic neuropathy Diabetes well-controlled with blood sugar 5.4. Manages without medication. Reports regular numbness in feet, no new issues. - Monitor foot health regularly for changes.  Chronic left hip pain due to old fracture Chronic pain persists due to old fracture. Manages with tramadol , opted against surgery. - Continue tramadol  as needed for pain. - Consider increasing tramadol  dosage if pain persists and is tolerable.  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 Uses cane for mobility. Physical therapy stopped due to vertigo. - Consider resuming physical therapy if vertigo is managed. - Continue using a cane for stability.  Vertigo, likely orthostatic Vertigo likely orthostatic, triggered by standing or quick movements. ENT evaluation ruled out inner ear issues. Discussed hydration and nutrition management. - Prescribe meclizine as needed for dizziness. - Ensure adequate hydration and nutrition.  Anxiety symptoms Intermittent anxiety symptoms possibly related to aging and reduced activity. Considering non-pharmacological approaches. - Monitor anxiety symptoms and consider non-pharmacological interventions. - Reassess anxiety management at next visit.         Orders Placed This Encounter  Procedures   AMB Referral [facility] Care Management    Referral Priority:   Routine    Referral Type:   Consultation    Referral Reason:   Care Coordination    Number of Visits Requested:   1   POCT HgB A1C    Meds ordered this encounter  Medications   meclizine (ANTIVERT) 25 MG tablet    Sig: Take 1 tablet (25 mg total) by mouth 3 (three) times daily as needed for dizziness.    Dispense:  30 tablet    Refill:  0    Follow up plan: Return  for 6 month fasting lab > 1 week later Annual Physical.  Future labs ordered for 01/17/25 add Urine micro   Marsa Officer, DO [facility] 07/19/2024, 11:16 AM

## 2024-07-19 NOTE — Patient Instructions (Addendum)
 Thank you for coming to the office today.  Let me know if want to talk to someone such as therapist for the anxiety component.  - You may take Meclizine as needed up to 3 times a day for dizziness, this will not cure symptoms but may help. Caution may make you drowsy.  Goal to take more of the Tramadol  for pain.  We can setup case manager to call if you are interested to talk to someone  Please schedule a Follow-up Appointment to: Return for 6 month fasting lab > 1 week later Annual Physical.  If you have any other questions or concerns, please feel free to call the office or send a message through MyChart. You may also schedule an earlier appointment if necessary.  Additionally, you may be receiving a survey about your experience at our office within a few days to 1 week by e-mail or mail. We value your feedback.  Marsa Officer, DO [facility], [place]

## 2024-07-21 ENCOUNTER — Telehealth: Payer: Self-pay

## 2024-07-21 NOTE — Progress Notes (Signed)
 Complex Care Management Note Care Guide Note  07/21/2024 Name: Yvette [name] MRN: [id number] DOB: Jun 20, 1946   Complex Care Management Outreach Attempts: An unsuccessful telephone outreach was attempted today to offer the patient information about available complex care management services.  Follow Up Plan:  Additional outreach attempts will be made to offer the patient complex care management information and services.   Encounter Outcome:  No Answer  .Debbe Fuse [facility], [facility] Guide  Direct Dial: [phone]  Fax [phone]

## 2024-07-23 NOTE — Progress Notes (Signed)
 Complex Care Management Note  Care Guide Note 07/23/2024 Name: Yvette [name] MRN: [id number] DOB: 11-Mar-1946  Yvette [name] is a 78 y.o. year old female who sees Edman Marsa PARAS, DO for primary care. I reached out to [name] by phone today to offer complex care management services.  [name] was given information about Complex Care Management services today including:   The Complex Care Management services include support from the care team which includes your Nurse Care Manager, Clinical Social Worker, or Pharmacist.  The Complex Care Management team is here to help remove barriers to the health concerns and goals most important to you. Complex Care Management services are voluntary, and the patient may decline or stop services at any time by request to their care team member.   Complex Care Management Consent Status: Patient agreed to services and verbal consent obtained.   Follow up plan:  Telephone appointment with complex care management team member scheduled for:  08/02/2024  Encounter Outcome:  Patient Scheduled  .Debbe Fuse [facility], [facility] Guide  Direct Dial: [phone]  Fax [phone]

## 2024-07-30 ENCOUNTER — Other Ambulatory Visit: Payer: Self-pay | Admitting: Family Medicine

## 2024-07-30 DIAGNOSIS — I10 Essential (primary) hypertension: Secondary | ICD-10-CM

## 2024-07-30 DIAGNOSIS — K219 Gastro-esophageal reflux disease without esophagitis: Secondary | ICD-10-CM

## 2024-08-02 ENCOUNTER — Other Ambulatory Visit: Payer: Self-pay

## 2024-08-02 NOTE — Patient Outreach (Signed)
 Complex Care Management   Visit Note  08/02/2024  Name:  Yvette [name] MRN: [id number] DOB: 09/17/1946  Situation: Referral received for Complex Care Management related to Chronic Health Maintenance.  I obtained verbal consent from Patient.  Visit completed with Yvette [name]  on the phone.    Background:   Past Medical History:  Diagnosis Date   Arthropathy, unspecified, site unspecified    Esophageal reflux    Osteoarthritis    Osteoporosis, unspecified    Other abnormal glucose    Other and unspecified hyperlipidemia    Symptomatic menopausal or female climacteric states    Syncope and collapse     Assessment: Patient Reported Symptoms:  Cognitive Cognitive Status: Alert and oriented to person, place, and time, Normal speech and language skills, Insightful and able to interpret abstract concepts Cognitive/Intellectual Conditions Management [RPT]: None reported or documented in medical history or problem list   Health Maintenance Behaviors: Annual physical exam  Neurological Neurological Review of Symptoms: No symptoms reported Neurological Management Strategies: Medication therapy Neurological Self-Management Outcome: 5 (very good)  HEENT HEENT Symptoms Reported: No symptoms reported (Doesn't wear prescription glasses, only OTC reading, watching cataract growth, goes to [facility] annually)      Cardiovascular Cardiovascular Symptoms Reported: No symptoms reported Does patient have uncontrolled Hypertension?: No Cardiovascular Management Strategies: Medication therapy Cardiovascular Self-Management Outcome: 4 (good)  Respiratory Respiratory Symptoms Reported: No symptoms reported Respiratory Self-Management Outcome: 4 (good)  Endocrine Endocrine Symptoms Reported: No symptoms reported (A1C 5.4% 07/19/24) Is patient diabetic?: Yes Is patient checking blood sugars at home?: No Endocrine Self-Management Outcome: 5 (very good)  Gastrointestinal Additional  Gastrointestinal Details: Bowel Plan: if no BM in 2-3 days, takes a stool softener or will eat prunes, drink warm prune juice - this moves her bowels. Gastrointestinal Self-Management Outcome: 4 (good)    Genitourinary Genitourinary Symptoms Reported: No symptoms reported Genitourinary Management Strategies: Medication therapy Genitourinary Self-Management Outcome: 4 (good)  Integumentary Integumentary Symptoms Reported: No symptoms reported    Musculoskeletal Musculoskelatal Symptoms Reviewed: Unsteady gait, Back pain, Joint pain Additional Musculoskeletal Details: Uses a cane more often, especially when back and joint pain flare up. She has a landscape architect for long distance walking. Takes Tramadol  for pain, this works for her, takes her pain level down to 2, helps with her mobility. Musculoskeletal Management Strategies: Medication therapy Musculoskeletal Self-Management Outcome: 4 (good) Falls in the past year?: No Patient at Risk for Falls Due to: Impaired balance/gait  Psychosocial Psychosocial Symptoms Reported: No symptoms reported   Behaviors When Feeling Stressed/Fearful: states I feel mentally fine, I may feel blue every once in a while but I remedy that by putting my dog in the car and go for a ride.  She has friends that she visits, they go out to eat, good support system with her daughter and granddaughters, her dog, Beryl, is a good companion. Quality of Family Relationships: helpful, involved, supportive Do you feel physically threatened by others?: No    08/02/2024    PHQ2-9 Depression Screening   Little interest or pleasure in doing things Not at all  Feeling down, depressed, or hopeless Not at all  PHQ-2 - Total Score 0  Trouble falling or staying asleep, or sleeping too much    Feeling tired or having little energy    Poor appetite or overeating     Feeling bad about yourself - or that you are a failure or have let yourself or your family down    Trouble  concentrating  on things, such as reading the newspaper or watching television    Moving or speaking so slowly that other people could have noticed.  Or the opposite - being so fidgety or restless that you have been moving around a lot more than usual    Thoughts that you would be better off dead, or hurting yourself in some way    PHQ2-9 Total Score    If you checked off any problems, how difficult have these problems made it for you to do your work, take care of things at home, or get along with other people    Depression Interventions/Treatment      There were no vitals filed for this visit.  Medications Reviewed Today     Reviewed by Yvette [name] LABOR, RN (Registered Nurse) on 08/02/24 at 1026  Med List Status: <None>   Medication Order Taking? Sig Documenting Provider Last Dose Status Informant  Acetaminophen  (TYLENOL  8 HOUR ARTHRITIS PAIN PO) [id number] Yes Take by mouth. 2 in am 1 in pm  Patient taking differently: Take by mouth as needed. 2 in am 1 in pm   [provider]  Active   albuterol  (VENTOLIN  HFA) 108 (90 Base) MCG/ACT inhaler [id number] Yes Inhale 2 puffs into the lungs every 4 (four) hours as needed for wheezing or shortness of breath (cough). Edman Marsa PARAS, DO  Active   amLODipine  (NORVASC ) 10 MG tablet [id number] Yes Take 1 tablet (10 mg total) by mouth daily. Edman Marsa PARAS, DO  Active   ascorbic acid (VITAMIN C) 500 MG tablet [id number] Yes Take by mouth. [provider]  Active   beta carotene w/minerals (OCUVITE) tablet [id number] Yes Take 1 tablet by mouth daily. [provider]  Active   carvedilol  (COREG ) 12.5 MG tablet [id number] Yes Take 1 tablet (12.5 mg total) by mouth 2 (two) times daily with a meal. Edman, Marsa PARAS, DO  Active   Cholecalciferol 10 MCG (400 UNIT) CAPS [id number]  Take 1 tablet by mouth daily with breakfast. [provider]  Active   clopidogrel  (PLAVIX ) 75 MG tablet [id number] Yes Take 1  tablet (75 mg total) by mouth daily. Onita Duos, MD  Active   estradiol  (ESTRACE ) 0.1 MG/GM vaginal cream [id number] Yes Estrogen Cream Instruction  Discard applicator  Apply pea sized amount to tip of finger to urethra before bed. Wash hands well after application. Use Monday, Wednesday and Friday  Estrogen Cream Instruction  Discard applicator  Apply pea sized amount to tip of finger to urethra before bed. Wash hands well after application. Use Monday, Wednesday and Friday Yvette Clotilda LABOR, PA-C  Active   fluticasone  (FLONASE ) 50 MCG/ACT nasal spray [id number] Yes Place 2 sprays into both nostrils daily. Edman Marsa PARAS, DO  Active            Med Note BEVERLEE, DESHANNON L   Tue Jul 01, 2023  1:15 PM)    gabapentin  (NEURONTIN ) 300 MG capsule [id number]  Take 3 capsules (900 mg total) by mouth at bedtime.  Patient not taking: Reported on 08/02/2024   Edman Marsa PARAS, DO  Active   hydrochlorothiazide  (HYDRODIURIL ) 25 MG tablet [id number] Yes Take 1 tablet (25 mg total) by mouth daily. Edman Marsa PARAS, DO  Active   irbesartan  (AVAPRO ) 300 MG tablet [id number] Yes Take 1 tablet (300 mg total) by mouth daily. Edman Marsa PARAS, DO  Active   Krill Oil 300 [place] CAPS [id number] Yes Take by mouth daily. [provider]  Active  loratadine (CLARITIN) 10 MG tablet [id number] Yes Take 10 mg by mouth daily. [provider]  Active   Mag Aspart-Potassium Aspart (POTASSIUM & MAGNESIUM ASPARTAT PO) [id number] Yes Take 1 tablet by mouth daily. [provider]  Active   meclizine (ANTIVERT) 25 MG tablet [id number] Yes Take 1 tablet (25 mg total) by mouth 3 (three) times daily as needed for dizziness. Edman Marsa PARAS, DO  Active   mirabegron  ER (MYRBETRIQ ) 50 MG TB24 tablet [id number] Yes Take 1 tablet (50 mg total) by mouth daily.  Patient taking differently: Take 50 mg by mouth daily. TAKES PRN   Yvette Clotilda LABOR, PA-C  Active   Multiple  Vitamin (MULTIVITAMIN) tablet [id number] Yes Take 1 tablet by mouth daily. [provider]  Active   pantoprazole  (PROTONIX ) 40 MG tablet [id number] Yes Take 1 tablet (40 mg total) by mouth daily before breakfast. Edman Marsa PARAS, DO  Active   REPATHA  SURECLICK 140 MG/ML EMMANUEL [id number] Yes Inject 140 mg into the skin every 14 (fourteen) days. Edman Marsa PARAS, DO  Active   traMADol  (ULTRAM ) 50 MG tablet [id number] Yes Take 1 tablet (50 mg total) by mouth every 8 (eight) hours as needed. Max 2 pills in 24 hours. Edman Marsa PARAS, DO  Active             Recommendation:   Continue Current Plan of Care  Follow Up Plan:   Telephone follow-up two weeks.  [name] Stamp BSN, CCM [place]  [facility] Population Health RN Care Manager Direct Dial: [phone]  Fax: [phone]

## 2024-08-02 NOTE — Telephone Encounter (Signed)
 Requested Prescriptions  Pending Prescriptions Disp Refills   carvedilol  (COREG ) 12.5 MG tablet [Pharmacy Med Name: CARVEDILOL  12.5 MG TABLET] 180 tablet 1    Sig: Take 1 tablet (12.5 mg total) by mouth 2 (two) times daily with a meal.     Cardiovascular: Beta Blockers 3 Failed - 08/02/2024 10:00 AM      Failed - Cr in normal range and within 360 days    Creat  Date Value Ref Range Status  01/06/2024 0.56 (L) 0.60 - 1.00 mg/dL Final   Creatinine, Urine  Date Value Ref Range Status  07/08/2023 166 20 - 275 mg/dL Final         Passed - AST in normal range and within 360 days    AST  Date Value Ref Range Status  01/06/2024 12 10 - 35 U/L Final   SGOT(AST)  Date Value Ref Range Status  11/20/2011 54 (H) 15 - 37 Unit/L Final         Passed - ALT in normal range and within 360 days    ALT  Date Value Ref Range Status  01/06/2024 7 6 - 29 U/L Final   SGPT (ALT)  Date Value Ref Range Status  11/20/2011 62 U/L Final    Comment:    12-78 NOTE: NEW REFERENCE RANGE 08/30/2011          Passed - Last BP in normal range    BP Readings from Last 1 Encounters:  07/19/24 138/78         Passed - Last Heart Rate in normal range    Pulse Readings from Last 1 Encounters:  07/19/24 69         Passed - Valid encounter within last 6 months    Recent Outpatient Visits           2 weeks ago Annual physical exam   [place] [facility] [place] Marsa PARAS, DO   6 months ago Annual physical exam   [place] [place], Marsa PARAS, DO   8 months ago Spondylosis of lumbar region without myelopathy or radiculopathy   [place] [facility] [place], Marsa PARAS, DO       Future Appointments             In 4 months McGowan, Clotilda DELENA RIGGERS [facility] [place]

## 2024-08-02 NOTE — Patient Instructions (Signed)
 Visit Information  Thank you for taking time to visit with me today. Please don't hesitate to contact me if I can be of assistance to you before our next scheduled appointment.  Our next appointment is by telephone on Monday, November 10th at 10:00am,.  Please call the care guide team at [phone] if you need to cancel or reschedule your appointment.   Following is a copy of your care plan:   Goals Addressed   None     A reminder to ALL patients/family/friends, please call the [place]  National Suicide Prevention Lifeline: 623-052-8938 or TTY: 234 622 1120 TTY [phone]) to talk to a trained counselor if you are experiencing a Mental Health or Behavioral Health Crisis or need someone to talk to.  Patient verbalized understanding of Care plan and visit instructions communicated this visit  Santana Stamp BSN, CCM [place]  [facility] Population Health RN Care Manager Direct Dial: [phone]  Fax: [phone]

## 2024-08-02 NOTE — Telephone Encounter (Signed)
 Requested Prescriptions  Pending Prescriptions Disp Refills   pantoprazole  (PROTONIX ) 40 MG tablet [Pharmacy Med Name: PANTOPRAZOLE  SOD DR 40 MG TAB] 90 tablet 2    Sig: Take 1 tablet (40 mg total) by mouth daily before breakfast.     Gastroenterology: Proton Pump Inhibitors Passed - 08/02/2024  8:53 AM      Passed - Valid encounter within last 12 months    Recent Outpatient Visits           2 weeks ago Annual physical exam   [place] [facility] [place], Marsa PARAS, DO   6 months ago Annual physical exam   [place] [facility] [place], Marsa PARAS, DO   8 months ago Spondylosis of lumbar region without myelopathy or radiculopathy   [place] [facility] [place], Marsa PARAS, DO       Future Appointments             In 4 months McGowan, Clotilda DELENA RIGGERS [facility] [place]

## 2024-08-04 DIAGNOSIS — E119 Type 2 diabetes mellitus without complications: Secondary | ICD-10-CM | POA: Diagnosis not present

## 2024-08-05 LAB — LAB REPORT - SCANNED
Albumin, Urine POC: 1.9
Albumin/Creatinine Ratio, Urine, POC: 13
Creatinine, POC: 152 mg/dL
EGFR: 90

## 2024-08-16 ENCOUNTER — Other Ambulatory Visit: Payer: Self-pay

## 2024-08-16 NOTE — Patient Instructions (Signed)
 Visit Information  Thank you for taking time to visit with me today. Please don't hesitate to contact me if I can be of assistance to you before our next scheduled appointment.  Your next care management appointment is by telephone on Monday, December 8th at 1:00pm.   Please call the care guide team at [phone] if you need to cancel, schedule, or reschedule an appointment.   A reminder to ALL patients/family/friends, please call the [place]  National Suicide Prevention Lifeline: 336-577-7133 or TTY: 279 680 0290 TTY [phone]) to talk to a trained counselor if you are experiencing a Mental Health or Behavioral Health Crisis or need someone to talk to.  Santana Stamp BSN, CCM [place]  [facility] Population Health RN Care Manager Direct Dial: [phone]  Fax: [phone]

## 2024-08-16 NOTE — Patient Outreach (Signed)
 Complex Care Management   Visit Note  08/16/2024  Name:  Yvette [name] MRN: [id number] DOB: 11-25-1945  Situation: Referral received for Complex Care Management related to 07/19/24 patient has issues adapting to some decline in chronic health, often declining to spend additional time with family feels like would be slowing them down. Not endorsing significant depression I obtained verbal consent from Patient.  Visit completed with Yvette [name]  on the phone. She is taking Repatha  to lower cholesterol.   Background:   Past Medical History:  Diagnosis Date   Arthropathy, unspecified, site unspecified    Esophageal reflux    Osteoarthritis    Osteoporosis, unspecified    Other abnormal glucose    Other and unspecified hyperlipidemia    Symptomatic menopausal or female climacteric states    Syncope and collapse     Assessment: Patient Reported Symptoms:  Cognitive Cognitive Status: No symptoms reported, Alert and oriented to person, place, and time, Insightful and able to interpret abstract concepts, Normal speech and language skills      Neurological Neurological Review of Symptoms: Not assessed    HEENT HEENT Symptoms Reported: Not assessed      Cardiovascular Cardiovascular Symptoms Reported: No symptoms reported Cardiovascular Management Strategies: Medication therapy Cardiovascular Comment: Taking Repatha  as ordered, will have lab work again in April 2026 with Dr. MARLA to assess efficacy  Respiratory Respiratory Symptoms Reported: No symptoms reported    Endocrine Is patient checking blood sugars at home?: Yes List most recent blood sugar readings, include date and time of day: No recent blood sugars to report Endocrine Comment: A1C 5.4% 07/19/24. she takes from time to time....this is what Dr. MARLA told me to do.  Gastrointestinal Gastrointestinal Symptoms Reported: No symptoms reported      Genitourinary Genitourinary Symptoms Reported: Not assessed    Integumentary  Integumentary Symptoms Reported: Not assessed    Musculoskeletal Musculoskelatal Symptoms Reviewed: Other Additional Musculoskeletal Details: hip/joint/back pain at baseline, decreased today, pain level documented        Psychosocial Psychosocial Symptoms Reported: No symptoms reported          08/16/2024    PHQ2-9 Depression Screening   Little interest or pleasure in doing things    Feeling down, depressed, or hopeless    PHQ-2 - Total Score    Trouble falling or staying asleep, or sleeping too much    Feeling tired or having little energy    Poor appetite or overeating     Feeling bad about yourself - or that you are a failure or have let yourself or your family down    Trouble concentrating on things, such as reading the newspaper or watching television    Moving or speaking so slowly that other people could have noticed.  Or the opposite - being so fidgety or restless that you have been moving around a lot more than usual    Thoughts that you would be better off dead, or hurting yourself in some way    PHQ2-9 Total Score    If you checked off any problems, how difficult have these problems made it for you to do your work, take care of things at home, or get along with other people    Depression Interventions/Treatment      There were no vitals filed for this visit.  MEDICATIONS: No concerns with meds today, taking as prescribed, discussed Repatha  for cholesterol, no problems with refills.    Recommendation:   Continue Current Plan of Care Patient  will continue to plan meals around health foods, watching processed baked good, processed meats, focusing on veggies, fruits, quality proteins such as chicken, beans.   Follow Up Plan:   Telephone follow-up in 1 month  Yvette [name] BSN, CCM [place]  [facility] Population Health RN Care Manager Direct Dial: [phone]  Fax: [phone]

## 2024-08-19 ENCOUNTER — Encounter: Payer: Self-pay | Admitting: Family Medicine

## 2024-09-13 ENCOUNTER — Telehealth: Payer: Self-pay | Admitting: Pharmacist

## 2024-09-13 ENCOUNTER — Encounter: Payer: Self-pay | Admitting: Pharmacist

## 2024-09-13 ENCOUNTER — Other Ambulatory Visit: Payer: Self-pay | Admitting: Pharmacist

## 2024-09-13 ENCOUNTER — Other Ambulatory Visit: Payer: Self-pay

## 2024-09-13 DIAGNOSIS — E1169 Type 2 diabetes mellitus with other specified complication: Secondary | ICD-10-CM

## 2024-09-13 MED ORDER — BLOOD GLUCOSE MONITORING SUPPL DEVI: 0 refills | Status: AC

## 2024-09-13 MED ORDER — LANCETS MISC: 0 refills | Status: AC

## 2024-09-13 MED ORDER — BLOOD GLUCOSE TEST VI STRP: ORAL_STRIP | 0 refills | Status: AC

## 2024-09-13 NOTE — Progress Notes (Signed)
   09/13/2024  Patient ID: Yvette [name], female   DOB: 1946/02/01, 78 y.o.   MRN: [id number]  Receive message from Nurse Care Manager [facility] requesting aid to patient with obtaining blood sugar testing supplies.   Diabetes:  Current medications: none  Reports prefers to check her home blood sugar occasionally. Reports previously purchased a glucometer from [employer] (does not recall the brand), but the testing supplies are no longer made.  Requests prescription for new glucometer/testing supplies be sent to [facility]    Lab Results  Component Value Date   HGBA1C 5.4 07/19/2024     Will send prescriptions for glucometer/testing supplies to pharmacy for patient today as requested, per protocol.   Review with patient goal blood sugar readings for when she checks at home occasionally  Patient denies further medication questions or concerns today Provide patient with contact information for clinic pharmacist to contact if needed in future for medication questions/concerns   Sharyle Sia, PharmD, [facility] [phone]

## 2024-09-13 NOTE — Patient Instructions (Signed)
 We have sent prescriptions for a blood sugar monitor and supplies to [facility] for you today. This prescription allows your pharmacy to fill with whichever brand is preferred through your Medicare prescription coverage.   [facility] [address] [place] - [address] [place] [address] Phone: [phone]    As you continue to check blood sugar occasionally at home, please keep in mind that our goal for fasting sugars is less than 130 mg/dL and 2 hour after meal sugars less than 180 mg/dL.   Thank you!   Sharyle Sia, PharmD, [facility] Clinical Pharmacist [facility] [phone]

## 2024-09-13 NOTE — Progress Notes (Signed)
 Receive message from Nurse Care Manager [facility] requesting aid to patient with obtaining blood sugar testing supplies.   From review of chart, do not see record of previous glucometer   Was unable to reach patient via telephone today and have left HIPAA compliant voicemail asking patient to return my call.    Sharyle Sia, PharmD, [facility] Clinical Pharmacist [facility] [phone]

## 2024-09-13 NOTE — Patient Instructions (Signed)
 Visit Information  Thank you for taking time to visit with me today. Please don't hesitate to contact me if I can be of assistance to you before our next scheduled appointment.  Your next care management appointment is by telephone on Monday, January 5th at 1:00pm   Please call the care guide team at [phone] if you need to cancel, schedule, or reschedule an appointment.   Please call the [place]  National Suicide Prevention Lifeline: 858-146-0802 or TTY: (254)617-3875 TTY 941-054-2134) to talk to a trained counselor if you are experiencing a Mental Health or Behavioral Health Crisis or need someone to talk to.  Santana Stamp BSN, CCM [place]  [facility] Population Health RN Care Manager Direct Dial: [phone]  Fax: [phone]

## 2024-09-13 NOTE — Patient Outreach (Signed)
 Complex Care Management   Visit Note  09/13/2024  Name:  Yvette [name] MRN: [id number] DOB: 10/26/1945  Situation: Referral received for Complex Care Management related to HTN. I obtained verbal consent from Patient.  Visit completed with Ms. Armenteros  on the phone.   Background:   Past Medical History:  Diagnosis Date   Arthropathy, unspecified, site unspecified    Esophageal reflux    Osteoarthritis    Osteoporosis, unspecified    Other abnormal glucose    Other and unspecified hyperlipidemia    Symptomatic menopausal or female climacteric states    Syncope and collapse     Assessment: Patient Reported Symptoms:  Cognitive Cognitive Status: Alert and oriented to person, place, and time, Insightful and able to interpret abstract concepts, Normal speech and language skills, No symptoms reported      Neurological Neurological Review of Symptoms: No symptoms reported    HEENT HEENT Symptoms Reported: No symptoms reported      Cardiovascular Cardiovascular Symptoms Reported: No symptoms reported    Respiratory Respiratory Symptoms Reported: No symptoms reported Additional Respiratory Details: She has a small humidifier.    Endocrine Endocrine Symptoms Reported: No symptoms reported Is patient diabetic?: Yes Is patient checking blood sugars at home?: Yes List most recent blood sugar readings, include date and time of day: No recent blood sugars to report, she needs a new glucometer. Endocrine Self-Management Outcome: 5 (very good) Endocrine Comment: A1C 5.4% 07/19/24. she takes from time to time....this is what Dr. MARLA told me to do.  Gastrointestinal Gastrointestinal Symptoms Reported: No symptoms reported      Genitourinary Genitourinary Symptoms Reported: No symptoms reported    Integumentary Integumentary Symptoms Reported: No symptoms reported    Musculoskeletal Musculoskelatal Symptoms Reviewed: Unsteady gait        Psychosocial Psychosocial Symptoms Reported:  No symptoms reported Behavioral Health Comment: She was able to spend time with her family, they live close by and can rely on them. 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   Denies difficulty with depression at this time, states this is my favorite time of year.        09/13/2024    PHQ2-9 Depression Screening   Little interest or pleasure in doing things    Feeling down, depressed, or hopeless    PHQ-2 - Total Score    Trouble falling or staying asleep, or sleeping too much    Feeling tired or having little energy    Poor appetite or overeating     Feeling bad about yourself - or that you are a failure or have let yourself or your family down    Trouble concentrating on things, such as reading the newspaper or watching television    Moving or speaking so slowly that other people could have noticed.  Or the opposite - being so fidgety or restless that you have been moving around a lot more than usual    Thoughts that you would be better off dead, or hurting yourself in some way    PHQ2-9 Total Score    If you checked off any problems, how difficult have these problems made it for you to do your work, take care of things at home, or get along with other people    Depression Interventions/Treatment      There  were no vitals filed for this visit. Pain Scale: 0-10 Pain Score: 0-No pain  MEDICATIONS: Needs new glucometer, old one is broken.  Taking meds as ordered, no issues with refills.    Recommendation:   Continue Current Plan of Care Referral made to Pharmacy for new glucometer, sent EPIC message to Sharyle Sia to inquire which glucometer is preferred by [facility].   Follow Up Plan:   Telephone follow-up in 1 month  Santana Stamp BSN, CCM [place]  [facility] Population Health RN Care Manager Direct Dial:  [phone]  Fax: [phone]

## 2024-09-17 ENCOUNTER — Encounter: Payer: Self-pay | Admitting: Family Medicine

## 2024-10-11 ENCOUNTER — Other Ambulatory Visit: Payer: Self-pay

## 2024-10-11 NOTE — Patient Outreach (Signed)
 Complex Care Management   Visit Note  10/11/2024  Name:  Yvette [name] MRN: [id number] DOB: [birth year]  Situation: Referral received for Complex Care Management related to HTN and DM I obtained verbal consent from Patient.  Visit completed with Yvette [name] on the phone and patient denies any new or worsening health concerns. She has all medications and no risk of running out. She reports her son in law is delivering her a new recliner today and she is very excited about this. She has been cleaning the area where recliner will be placed.   Background:   Past Medical History:  Diagnosis Date   Arthropathy, unspecified, site unspecified    Esophageal reflux    Osteoarthritis    Osteoporosis, unspecified    Other abnormal glucose    Other and unspecified hyperlipidemia    Symptomatic menopausal or female climacteric states    Syncope and collapse     Assessment: Patient Reported Symptoms:  Cognitive Cognitive Status: Alert and oriented to person, place, and time, Insightful and able to interpret abstract concepts, Normal speech and language skills      Neurological Neurological Review of Symptoms: Not assessed    HEENT HEENT Symptoms Reported: Not assessed      Cardiovascular Cardiovascular Symptoms Reported: Not assessed    Respiratory Respiratory Symptoms Reported: Not assesed    Endocrine Endocrine Symptoms Reported: No symptoms reported Is patient diabetic?: Yes Is patient checking blood sugars at home?: Yes List most recent blood sugar readings, include date and time of day: Checks BGM a few times a week. Last reading 10/09/2024=126 Endocrine Self-Management Outcome: 5 (very good)  Gastrointestinal Gastrointestinal Symptoms Reported: Not assessed      Genitourinary Genitourinary Symptoms Reported: Not assessed    Integumentary Integumentary Symptoms Reported: Not assessed    Musculoskeletal Musculoskelatal Symptoms Reviewed: Not assessed        Psychosocial  Psychosocial Symptoms Reported: Not assessed          10/11/2024    PHQ2-9 Depression Screening   Little interest or pleasure in doing things    Feeling down, depressed, or hopeless    PHQ-2 - Total Score    Trouble falling or staying asleep, or sleeping too much    Feeling tired or having little energy    Poor appetite or overeating     Feeling bad about yourself - or that you are a failure or have let yourself or your family down    Trouble concentrating on things, such as reading the newspaper or watching television    Moving or speaking so slowly that other people could have noticed.  Or the opposite - being so fidgety or restless that you have been moving around a lot more than usual    Thoughts that you would be better off dead, or hurting yourself in some way    PHQ2-9 Total Score    If you checked off any problems, how difficult have these problems made it for you to do your work, take care of things at home, or get along with other people    Depression Interventions/Treatment      There were no vitals filed for this visit. Pain Scale: 0-10 Pain Score: 0-No pain  Medications Reviewed Today   Medications were not reviewed in this encounter     Recommendation:   Continue Current Plan of Care Report any Bps treading above 140/90 to PCP office  Follow Up Plan:   Telephone follow-up in 1 month Telephone follow up  appointment date/time:  11/08/2024@1300   Elida Pulse, RNCM Case Manager [facility], [facility] Health Direct Dial: [phone]

## 2024-10-11 NOTE — Patient Instructions (Signed)
 Visit Information  Thank you for taking time to visit with me today. Please don't hesitate to contact me if I can be of assistance to you before our next scheduled appointment.  Your next care management appointment is by telephone on 11/08/2024 at 1300   Please call the care guide team at [phone] if you need to cancel, schedule, or reschedule an appointment.   Please call the [place]  National Suicide Prevention Lifeline: (212) 253-1430 or TTY: (715)758-3833 TTY [phone]) to talk to a trained counselor if you are experiencing a Mental Health or Behavioral Health Crisis or need someone to talk to.  Elida Pulse, RNCM Case Manager [facility], Population Health Direct Dial: [phone]

## 2024-10-12 ENCOUNTER — Encounter: Payer: Self-pay | Admitting: Urology

## 2024-10-18 ENCOUNTER — Telehealth: Payer: Self-pay

## 2024-10-18 NOTE — Telephone Encounter (Signed)
 Yes she does. She has history of Stroke. Also Type 2 Diabetes.  However, I don't believe we need to call them back - because I just completed the Faxed Form on this topic and it is ready to be sent later today or tomorrow AM when faxes go out.  This is usually a fax form, I am not sure why they are now also calling.  We can just fax the form.  Thank you  Marsa Officer, DO [facility] 10/18/2024, 1:02 PM

## 2024-10-18 NOTE — Telephone Encounter (Signed)
 Copied from CRM [id number]. Topic: General - Call Back - No Documentation >> Oct 18, 2024 10:18 AM Olam RAMAN wrote: Reason for CRM: [id number] from juliene from [facility] plans calling to verify if pt has a cardio vascular disease.  Cb: [id number] Ref: [id number]

## 2024-11-08 ENCOUNTER — Telehealth: Payer: Self-pay

## 2024-12-13 ENCOUNTER — Ambulatory Visit: Admitting: Urology

## 2025-01-17 ENCOUNTER — Other Ambulatory Visit

## 2025-01-24 ENCOUNTER — Encounter: Admitting: Family Medicine

## 2025-04-01 ENCOUNTER — Ambulatory Visit

## 2025-04-06 ENCOUNTER — Ambulatory Visit
